# Patient Record
Sex: Male | Born: 1950
Health system: Southern US, Community
[De-identification: ages and names within clinical notes are randomized; demographics above are authoritative.]

## PROBLEM LIST (undated history)

## (undated) DIAGNOSIS — I469 Cardiac arrest, cause unspecified: Secondary | ICD-10-CM

## (undated) DIAGNOSIS — I5023 Acute on chronic systolic (congestive) heart failure: Secondary | ICD-10-CM

## (undated) DIAGNOSIS — I2510 Atherosclerotic heart disease of native coronary artery without angina pectoris: Secondary | ICD-10-CM

## (undated) DIAGNOSIS — R6520 Severe sepsis without septic shock: Secondary | ICD-10-CM

## (undated) DIAGNOSIS — I219 Acute myocardial infarction, unspecified: Secondary | ICD-10-CM

## (undated) DIAGNOSIS — C439 Malignant melanoma of skin, unspecified: Secondary | ICD-10-CM

## (undated) DIAGNOSIS — J189 Pneumonia, unspecified organism: Secondary | ICD-10-CM

## (undated) DIAGNOSIS — I472 Ventricular tachycardia, unspecified: Secondary | ICD-10-CM

## (undated) DIAGNOSIS — A419 Sepsis, unspecified organism: Secondary | ICD-10-CM

## (undated) DIAGNOSIS — F419 Anxiety disorder, unspecified: Secondary | ICD-10-CM

## (undated) DIAGNOSIS — I2589 Other forms of chronic ischemic heart disease: Secondary | ICD-10-CM

## (undated) DIAGNOSIS — E119 Type 2 diabetes mellitus without complications: Secondary | ICD-10-CM

## (undated) DIAGNOSIS — I238 Other current complications following acute myocardial infarction: Secondary | ICD-10-CM

## (undated) HISTORY — DX: Acute on chronic systolic (congestive) heart failure: I50.23

## (undated) HISTORY — DX: Malignant melanoma of skin, unspecified: C43.9

## (undated) HISTORY — DX: Ventricular tachycardia, unspecified: I47.20

## (undated) HISTORY — DX: Sepsis, unspecified organism: R65.20

## (undated) HISTORY — DX: Other current complications following acute myocardial infarction: I23.8

## (undated) HISTORY — DX: Severe sepsis without septic shock: A41.9

## (undated) HISTORY — DX: Atherosclerotic heart disease of native coronary artery without angina pectoris: I25.10

## (undated) HISTORY — PX: INSERT / REPLACE / REMOVE PACEMAKER: SUR710

## (undated) HISTORY — DX: Ventricular tachycardia: I47.2

## (undated) HISTORY — DX: Cardiac arrest, cause unspecified: I46.9

## (undated) HISTORY — DX: Acute myocardial infarction, unspecified: I21.9

## (undated) HISTORY — DX: Type 2 diabetes mellitus without complications: E11.9

## (undated) HISTORY — DX: Anxiety disorder, unspecified: F41.9

## (undated) HISTORY — PX: CATARACT EXTRACTION: SUR2

## (undated) HISTORY — PX: CARDIAC CATHETERIZATION: SHX172

## (undated) HISTORY — DX: Other forms of chronic ischemic heart disease: I25.89

---
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
## 2003-08-30 DIAGNOSIS — I219 Acute myocardial infarction, unspecified: Secondary | ICD-10-CM

## 2003-08-30 HISTORY — DX: Acute myocardial infarction, unspecified: I21.9

## 2005-12-31 ENCOUNTER — Ambulatory Visit: Payer: Self-pay | Admitting: Cardiology

## 2006-01-11 ENCOUNTER — Encounter: Payer: Self-pay | Admitting: Cardiology

## 2006-01-11 ENCOUNTER — Ambulatory Visit: Payer: Self-pay

## 2006-01-11 ENCOUNTER — Ambulatory Visit: Payer: Self-pay | Admitting: Cardiology

## 2006-01-17 ENCOUNTER — Ambulatory Visit: Payer: Self-pay | Admitting: Cardiology

## 2006-01-21 ENCOUNTER — Ambulatory Visit: Payer: Self-pay | Admitting: Internal Medicine

## 2006-01-28 ENCOUNTER — Ambulatory Visit: Payer: Self-pay | Admitting: Cardiology

## 2006-02-11 ENCOUNTER — Ambulatory Visit: Payer: Self-pay | Admitting: Internal Medicine

## 2006-02-28 ENCOUNTER — Ambulatory Visit: Payer: Self-pay | Admitting: Cardiology

## 2006-03-08 ENCOUNTER — Ambulatory Visit: Payer: Self-pay | Admitting: Internal Medicine

## 2006-03-18 ENCOUNTER — Ambulatory Visit: Payer: Self-pay | Admitting: Internal Medicine

## 2006-04-08 ENCOUNTER — Ambulatory Visit: Payer: Self-pay | Admitting: Cardiovascular Disease

## 2006-04-29 ENCOUNTER — Ambulatory Visit: Payer: Self-pay | Admitting: Cardiology

## 2006-06-20 ENCOUNTER — Ambulatory Visit: Payer: Self-pay | Admitting: Cardiology

## 2006-06-20 ENCOUNTER — Ambulatory Visit: Payer: Self-pay

## 2006-06-21 ENCOUNTER — Ambulatory Visit: Payer: Self-pay

## 2006-06-21 ENCOUNTER — Encounter: Payer: Self-pay | Admitting: Cardiology

## 2006-07-06 ENCOUNTER — Ambulatory Visit: Payer: Self-pay | Admitting: Cardiology

## 2006-07-25 ENCOUNTER — Ambulatory Visit: Payer: Self-pay | Admitting: Cardiology

## 2006-07-25 ENCOUNTER — Ambulatory Visit: Payer: Self-pay | Admitting: Internal Medicine

## 2006-08-08 ENCOUNTER — Ambulatory Visit: Payer: Self-pay | Admitting: *Deleted

## 2006-08-29 ENCOUNTER — Ambulatory Visit: Payer: Self-pay | Admitting: Cardiology

## 2006-09-26 ENCOUNTER — Ambulatory Visit: Payer: Self-pay | Admitting: Cardiology

## 2007-03-06 ENCOUNTER — Ambulatory Visit: Payer: Self-pay | Admitting: Cardiology

## 2007-03-06 LAB — CONVERTED CEMR LAB
ALT: 41 units/L — ABNORMAL HIGH (ref 0–40)
AST: 26 units/L (ref 0–37)
Bilirubin, Direct: 0.1 mg/dL (ref 0.0–0.3)
TSH: 1.68 microintl units/mL (ref 0.35–5.50)
Total Bilirubin: 0.8 mg/dL (ref 0.3–1.2)

## 2007-03-21 ENCOUNTER — Ambulatory Visit: Payer: Self-pay | Admitting: Cardiology

## 2007-03-21 ENCOUNTER — Ambulatory Visit: Payer: Self-pay | Admitting: Internal Medicine

## 2008-03-25 ENCOUNTER — Encounter: Payer: Self-pay | Admitting: Cardiology

## 2008-03-25 ENCOUNTER — Ambulatory Visit: Payer: Self-pay | Admitting: Cardiology

## 2008-03-25 ENCOUNTER — Ambulatory Visit: Payer: Self-pay

## 2008-03-26 ENCOUNTER — Ambulatory Visit: Payer: Self-pay | Admitting: Cardiology

## 2008-03-26 LAB — CONVERTED CEMR LAB
AST: 31 units/L (ref 0–37)
Alkaline Phosphatase: 50 units/L (ref 39–117)
Bilirubin, Direct: 0.1 mg/dL (ref 0.0–0.3)
Chloride: 107 meq/L (ref 96–112)
GFR calc Af Amer: 81 mL/min
GFR calc non Af Amer: 67 mL/min
Glucose, Bld: 111 mg/dL — ABNORMAL HIGH (ref 70–99)
HDL: 34.1 mg/dL — ABNORMAL LOW (ref >39.0)
Potassium: 4.4 meq/L (ref 3.5–5.1)
Pro B Natriuretic peptide (BNP): 37 pg/mL (ref 0.0–100.0)
Sodium: 140 meq/L (ref 135–145)
TSH: 1.8 microintl units/mL (ref 0.35–5.50)

## 2008-04-03 ENCOUNTER — Ambulatory Visit: Payer: Self-pay | Admitting: Internal Medicine

## 2008-11-26 ENCOUNTER — Encounter: Payer: Self-pay | Admitting: Emergency Medicine

## 2008-11-26 ENCOUNTER — Ambulatory Visit: Payer: Self-pay | Admitting: Cardiology

## 2008-11-26 ENCOUNTER — Inpatient Hospital Stay (HOSPITAL_COMMUNITY): Admission: EM | Admit: 2008-11-26 | Discharge: 2008-11-28 | Payer: Self-pay | Admitting: Cardiology

## 2008-11-27 ENCOUNTER — Encounter: Payer: Self-pay | Admitting: Cardiology

## 2008-11-28 LAB — CBC AND DIFFERENTIAL
HCT: 44 (ref 41–53)
Hemoglobin: 14.8 (ref 13.5–17.5)
Platelets: 154 (ref 150–399)
WBC: 7.2

## 2008-11-28 LAB — BASIC METABOLIC PANEL
BUN: 12 (ref 4–21)
Creatinine: 1.2 (ref 0.6–1.3)
Potassium: 4.3 (ref 3.4–5.3)
Sodium: 140 (ref 137–147)

## 2008-12-17 ENCOUNTER — Ambulatory Visit: Payer: Self-pay | Admitting: Cardiology

## 2008-12-24 ENCOUNTER — Ambulatory Visit: Payer: Self-pay | Admitting: Internal Medicine

## 2009-01-02 ENCOUNTER — Ambulatory Visit: Payer: Self-pay | Admitting: Internal Medicine

## 2009-01-02 DIAGNOSIS — I5023 Acute on chronic systolic (congestive) heart failure: Secondary | ICD-10-CM

## 2009-01-02 DIAGNOSIS — Z95810 Presence of automatic (implantable) cardiac defibrillator: Secondary | ICD-10-CM

## 2009-01-02 DIAGNOSIS — I5022 Chronic systolic (congestive) heart failure: Secondary | ICD-10-CM

## 2009-01-02 DIAGNOSIS — R0989 Other specified symptoms and signs involving the circulatory and respiratory systems: Secondary | ICD-10-CM | POA: Insufficient documentation

## 2009-01-02 HISTORY — DX: Acute on chronic systolic (congestive) heart failure: I50.23

## 2009-01-17 ENCOUNTER — Ambulatory Visit: Payer: Self-pay | Admitting: Cardiology

## 2009-01-22 ENCOUNTER — Encounter: Payer: Self-pay | Admitting: Internal Medicine

## 2009-01-24 ENCOUNTER — Ambulatory Visit: Admission: RE | Admit: 2009-01-24 | Discharge: 2009-01-24 | Payer: Self-pay | Admitting: Cardiology

## 2009-01-24 ENCOUNTER — Ambulatory Visit: Payer: Self-pay | Admitting: Internal Medicine

## 2009-02-18 ENCOUNTER — Encounter: Payer: Self-pay | Admitting: Nurse Practitioner

## 2009-02-18 ENCOUNTER — Ambulatory Visit: Payer: Self-pay | Admitting: Internal Medicine

## 2009-02-18 DIAGNOSIS — I238 Other current complications following acute myocardial infarction: Secondary | ICD-10-CM

## 2009-02-18 DIAGNOSIS — I2510 Atherosclerotic heart disease of native coronary artery without angina pectoris: Secondary | ICD-10-CM | POA: Insufficient documentation

## 2009-02-18 HISTORY — DX: Other current complications following acute myocardial infarction: I23.8

## 2009-03-19 ENCOUNTER — Encounter: Payer: Self-pay | Admitting: Internal Medicine

## 2009-03-19 ENCOUNTER — Ambulatory Visit: Payer: Self-pay | Admitting: Internal Medicine

## 2009-03-19 DIAGNOSIS — I472 Ventricular tachycardia, unspecified: Secondary | ICD-10-CM

## 2009-03-19 DIAGNOSIS — I2589 Other forms of chronic ischemic heart disease: Secondary | ICD-10-CM

## 2009-03-19 HISTORY — DX: Ventricular tachycardia, unspecified: I47.20

## 2009-03-19 HISTORY — DX: Other forms of chronic ischemic heart disease: I25.89

## 2009-03-19 HISTORY — DX: Ventricular tachycardia: I47.2

## 2009-04-02 ENCOUNTER — Telehealth: Payer: Self-pay | Admitting: Internal Medicine

## 2009-07-10 ENCOUNTER — Encounter: Payer: Self-pay | Admitting: Cardiology

## 2009-09-11 ENCOUNTER — Encounter (INDEPENDENT_AMBULATORY_CARE_PROVIDER_SITE_OTHER): Payer: Self-pay | Admitting: *Deleted

## 2009-12-26 ENCOUNTER — Telehealth: Payer: Self-pay | Admitting: Internal Medicine

## 2009-12-29 ENCOUNTER — Telehealth: Payer: Self-pay | Admitting: Internal Medicine

## 2010-08-28 ENCOUNTER — Encounter (INDEPENDENT_AMBULATORY_CARE_PROVIDER_SITE_OTHER): Payer: Self-pay | Admitting: *Deleted

## 2010-09-11 ENCOUNTER — Encounter: Payer: Self-pay | Admitting: Internal Medicine

## 2010-11-16 ENCOUNTER — Encounter: Payer: Self-pay | Admitting: Internal Medicine

## 2010-12-29 NOTE — Letter (Signed)
Summary: Device-Delinquent Check  [facility], Main Office  [address]   [place], [place] [address]   Phone: [phone]  Fax: [phone]     August 28, 2010 MRN: [id number]   Lee Hall [address] [place], [place]  [address]   Dear Lee Hall,  According to our records, you have not had your implanted device checked in the recommended period of time.  We are unable to determine appropriate device function without checking your device on a regular basis.  Please call our office to schedule an appointment with Dr Ladona Ridgel, as soon as possible.  If you are having your device checked by another physician, please call us so that we may update our records.  Thank you,  Letta Moynahan, EMT  August 28, 2010 3:07 PM  [facility] certified

## 2010-12-29 NOTE — Progress Notes (Signed)
Summary: refill  Phone Note Refill Request   Refills Requested: Medication #1:  AMIODARONE HCL 200 MG TABS 1 1/2 tablet by mouth daily..   Supply Requested: 3 months  Medication #2:  SIMVASTATIN 80 MG TABS take one tab  daily   Supply Requested: 3 months  Medication #3:  LISINOPRIL 20 MG TABS tke one tab once daily   Supply Requested: 3 months  Medication #4:  CARVEDILOL 12.5 MG TABS take 1 1/2 bid   Supply Requested: 3 months [facility] in [place] fax [phone] please call pt once done [phone]   Method Requested: Fax to Local Pharmacy Initial call taken by: Migdalia Dk,  December 26, 2009 3:53 PM    New/Updated Medications: AMIODARONE HCL 200 MG TABS (AMIODARONE HCL) Take 1 1/2  tablet by mouth daily Prescriptions: AMIODARONE HCL 200 MG TABS (AMIODARONE HCL) Take 1 1/2  tablet by mouth daily  #135 x 3   Entered by:   Laurance Flatten CMA   Authorized by:   Laren Boom, MD, [facility]   Signed by:   Laurance Flatten CMA on 12/26/2009   Method used:   Printed then faxed to ...       [facility]* (retail)       [address] [place], [place]  [address]       Ph: [phone]       Fax: [phone]   RxID:   [id number] CARVEDILOL 12.5 MG TABS (CARVEDILOL) take 1 1/2 bid  #180 x 3   Entered by:   Laurance Flatten CMA   Authorized by:   Laren Boom, MD, [facility]   Signed by:   Laurance Flatten CMA on 12/26/2009   Method used:   Printed then faxed to ...       [facility]* (retail)       [address] [place], [place]  [address]       Ph: [phone]       Fax: [phone]   RxID:   [id number] LISINOPRIL 20 MG TABS (LISINOPRIL) tke one tab once daily  #90 x 3   Entered by:   Laurance Flatten CMA   Authorized by:   Laren Boom, MD, [facility]   Signed by:   Laurance Flatten CMA on 12/26/2009   Method used:   Printed then faxed to ...       [facility]* (retail)       [address] [place], [place]  [address]       Ph: [phone]       Fax: [phone]   RxID:   [id number] SIMVASTATIN 80 MG TABS (SIMVASTATIN) take one tab  daily  #90 x 3   Entered by:   Laurance Flatten CMA   Authorized by:   Laren Boom, MD, [facility]   Signed by:   Laurance Flatten CMA on 12/26/2009   Method used:   Printed then faxed to ...       [facility]* (retail)       [address] [place], [place]  [address]       Ph: [phone]       Fax: [phone]   RxID:  [id number]  

## 2010-12-29 NOTE — Progress Notes (Signed)
Summary: please resend to correct pharm  Phone Note Refill Request   Refills Requested: Medication #1:  AMIODARONE HCL 200 MG TABS Take 1 1/2  tablet by mouth daily.  Medication #2:  SIMVASTATIN 80 MG TABS take one tab  daily  Medication #3:  LISINOPRIL 20 MG TABS tke one tab once daily  Medication #4:  CARVEDILOL 12.5 MG TABS take 1 1/2 bid Please resend to [facility] in [place] fax [phone] please call pt once done [phone]   Method Requested: Fax to Pharmacy Initial call taken by: Migdalia Dk,  December 29, 2009 10:40 AM  Follow-up for Phone Call        Rx faxed to pharmacy [facility]. Talked to [facility]  faxed on 12/29/09 Follow-up by: Oswald Hillock,  December 29, 2009 4:27 PM

## 2010-12-29 NOTE — Cardiovascular Report (Signed)
Summary: Certified Letter - Notice Left  Certified Letter - Notice Left   Imported By: Debby Freiberg 10/05/2010 14:33:48  _____________________________________________________________________  External Attachment:    Type:   Image     Comment:   External Document

## 2010-12-31 NOTE — Cardiovascular Report (Signed)
Summary: Certified Letter - Returned  Certified Letter - Returned   Imported By: Debby Freiberg 11/20/2010 14:06:06  _____________________________________________________________________  External Attachment:    Type:   Image     Comment:   External Document

## 2011-04-13 NOTE — Cardiovascular Report (Signed)
NAME:  Lee Hall, Lee Hall NO.:  [id number]   MEDICAL RECORD NO.:  [id number]          PATIENT TYPE:  INP   LOCATION:  2023                         FACILITY:  [facility]   PHYSICIAN:  Marca Ancona, MD      DATE OF BIRTH:  1951/09/15   DATE OF PROCEDURE:  DATE OF DISCHARGE:  11/26/2008                            CARDIAC CATHETERIZATION   PROCEDURE:  1. Left heart catheterization.  2. Coronary angiography.   INDICATIONS:  Ventricular tachycardia.  The patient does have a history  of ischemic cardiomyopathy.   PROCEDURE NOTE:  After informed consent was obtained, the right groin  was sterilely prepped and draped, 1% lidocaine was used to locally  anesthetize the right groin area.  The right common femoral artery was  entered using Seldinger technique and a 6-French arterial sheath was  placed in the artery.  The 6-French multipurpose catheter was used to  engage the left coronary artery and the right coronary artery.  The left  ventricle was entered using the 6-French multipurpose catheter.  Left  ventriculography was not performed given the patient's history of an  apical thrombus as well as an echo done this year.   FINDINGS:  Hemodynamics:  Aorta 106/63, LV 100/4/17.  Angiography:  The  right coronary artery is dominant.  There is a 30% distal RCA stenosis.  There is no significant left main disease.  There are minor luminal  irregularities in the circumflex.  There is a small ramus that does not  have any significant disease.  The proximal LAD is totally occluded  after the takeoff of a moderate-sized first diagonal.  There are right  to left collaterals from the PDA that fill the distal LAD for a short  length.   ASSESSMENT AND PLAN:  A 60 year old with ischemic cardiomyopathy who  presented with ventricular tachycardia.  He was found to have chronic  total occlusion of the LAD.  He does have collaterals from the PDA to  the distal LAD territory as well as a  moderate-sized first diagonal that  covers part of the lateral wall.  The plan at this point would be  medical management of his coronary artery disease and his congestive  heart failure.      Marca Ancona, MD  Electronically Signed     DM/MEDQ  D:  11/27/2008  T:  11/28/2008  Job:  [id number]   cc:   Jonelle Sidle, MD

## 2011-04-13 NOTE — Assessment & Plan Note (Signed)
[facility]                       [place] CARDIOLOGY OFFICE NOTE   LAKER, THOMPSON                   MRN:          [id number]  DATE:12/17/2008                            DOB:          [birth year]    CARDIOLOGIST:  Jonelle Sidle, MD   REASON FOR VISIT:  Posthospitalization followup.   HISTORY OF PRESENT ILLNESS:  Mr. Dettore is a 60 year old male with a  history of coronary artery disease and ischemic cardiomyopathy.  He was  recently admitted to [facility] with slow ventricular  tachycardia.  He underwent cardiac catheterization that demonstrated  stable anatomy.  He has a chronic total occlusion of the LAD with  collaterals from the PDA to the distal LAD.  Otherwise, he has  insignificant disease elsewhere.  His ejection fraction on  echocardiogram in late December 2009 was 25-35%.  The patient had his  amiodarone dose increased and his defibrillator was adjusted to  recognize ventricular tachycardia at a slower rate.  Since being  discharged from the hospital, he has had one episode that he thinks was  ventricular tachycardia.  He was lightheaded and flushed for about 15-20  minutes.  It sounds as though he had antitachycardia pacing that got him  out of the ventricular tachycardia.  Otherwise, he denies chest pain.  He has chronic dyspnea on exertion.  He describes NYHA class IIB-III  symptoms.  He denies orthopnea, PND, or pedal edema.  Denies any  syncope.   CURRENT MEDICATIONS:  1. Lisinopril 40 mg daily.  2. Aspirin 81 mg daily.  3. Pacerone 200 mg b.i.d.  4. Carvedilol 12.5 mg b.i.d.  5. Simvastatin 80 mg daily.   PHYSICAL EXAMINATION:  GENERAL:  He is a well-nourished, well-developed  man in no acute distress.  VITAL SIGNS:  Blood pressure 140/85, pulse 60, weight 210 pounds.  HEENT:  Normal.  NECK:  Without JVD.  CARDIAC:  Normal S1, S2.  Regular rate and rhythm.  LUNGS:  Clear to auscultation  bilaterally.  ABDOMEN:  Soft, nontender.  EXTREMITIES:  Without edema.  NEUROLOGIC:  He is alert and oriented x3.  Cranial nerves II through XII  grossly intact.  VASCULAR:  Right femoral arteriotomy site without hematoma or bruit.   ASSESSMENT AND PLAN:  1. Ventricular tachycardia in a 60 year old male with ischemic      cardiomyopathy with an ejection fraction of 25-35%.  He is status      post automatic implantable cardioverter-defibrillator and has had      his defibrillator adjusted recently.  He has also had his Pacerone      increased to 200 mg b.i.d.  I discussed his case today over the      telephone with his primary cardiologist, Dr. Diona Browner.  He feels      that the patient will be better served following up in the      [facility].  The patient also lives in [place].  He      primarily needs followup with Dr. Lewayne Bunting who has seen him in      the past with electrophysiology.  We will try to  get him in to see      Dr. Ladona Ridgel in the next 1-2 weeks especially in light of his recent      symptoms of recurrent ventricular tachycardia.  I explained to the      patient that he may or may not need further adjustments in his      medical therapy.  We will also try to set him up in the [facility] Failure Clinic in [place] to better adjust his medications      for his cardiomyopathy.  2. Coronary artery disease status post prior myocardial infarction.      He had a recent cardiac catheterization that demonstrated stable      anatomy.  3. Dyslipidemia.  He continues on simvastatin.   DISPOSITION:  As noted above, the patient will be set up for followup  with Dr. Ladona Ridgel in [place] as well as our CHF Clinic in [place].      Tereso Newcomer, PA-C  Electronically Signed      Gerrit Friends. Dietrich Pates, MD, [facility]  Electronically Signed   SW/MedQ  DD: 12/17/2008  DT: 12/18/2008  Job #: [id number]

## 2011-04-13 NOTE — Assessment & Plan Note (Signed)
[facility]                         ELECTROPHYSIOLOGY OFFICE NOTE   Lee Hall, Lee Hall                   MRN:          [id number]  DATE:12/24/2008                            DOB:          1951-09-10    [name] comes today for followup.  He is very pleasant middle-aged  male with an ischemic cardiomyopathy and ventricular tachycardia.  He  was admitted to hospital back in December with VT storm, his amiodarone  was up titrated, he returns today for followup.  I should note that the  patient's VT was below the detection rate back in December with VT  detection at that time set 170, but it has since reduced to 120.  The  patient gives history of 1 episode of palpitations occurring several  days ago.  He did not check his pulse.  He thought he was in VT, though  he did not get any ICD therapies.   CURRENT MEDICATIONS:  1. Lisinopril 40 a day.  2. Aspirin 81 a day.  3. Pacerone 200 twice a day.  4. Carvedilol 12.5 twice daily.  5. Simvastatin 80 a day.   PHYSICAL EXAM:  GENERAL:  He is a pleasant well-appearing middle-aged  man in no distress.  VITAL SIGNS:  Blood pressure was 120/78, the pulse 64 and regular, the  respirations were 18, the weight was 208 pounds.  NECK:  Revealed no jugular distention.  LUNGS:  Clear bilateral auscultation.  No wheezes, rales, or rhonchi are  present.  No increased work of breathing.  CARDIOVASCULAR:  Revealed regular rate and rhythm.  Normal S1-S2.  ABDOMEN:  Soft, nontender.  EXTREMITIES:  Demonstrated no edema.   Interrogation of his defibrillator demonstrates Medtronic maximal, the P  and R waves were 3 and 6 respectively.  The impedance is 500 in the A,  432 in the V, the threshold is 1.2 in the A and 1.3 in the V.  Battery  voltage was 3 volts.  The patient's underlying rhythm was sinus and 60  beats per minute.  There are no intermittent ICD therapies.  He was 100%  A sensed, V sensed.   IMPRESSION:  1. Ventricular tachycardia.  2. Amiodarone therapy secondary to ventricular tachycardia.  3. Ischemic cardiomyopathy, status post anterior myocardial infarction      with the ejection fraction of 30%, class I to II heart failure.   DISCUSSION:  [name] is stable.  His VT is quiet.  The present  time on amiodarone.  I plan to see him back in 4 months and at that time  the plan will be to decrease his amiodarone from 400-300 a day if he has  more VT, he is instructed call us.  I have told if he feels  palpitations to try to have a blood pressure machine check his heart  rate and if his heart rates  were above 100 to call us.  Finally because the patient gets dizzy when  he stands up quickly, I have asked him that he slow down and not to get  up in a hurry.     Doylene Canning. Ladona Ridgel, MD  Electronically Signed    GWT/MedQ  DD: 12/24/2008  DT: 12/25/2008  Job #: [id number]

## 2011-04-13 NOTE — Assessment & Plan Note (Signed)
[facility]                         ELECTROPHYSIOLOGY OFFICE NOTE   ANKITH, EDMONSTON                   MRN:          [id number]  DATE:04/03/2008                            DOB:          07/14/1951    Mr. Whiteman returns today after a long absence from our EP clinic.  He  is a very pleasant middle-aged male with an ischemic cardiomyopathy and  ventricular tachycardia, who is status post ICD implantation.  He  returns today for followup.   He is presently on:  1. Amiodarone 200 mg a day.  2. Toprol-XL 100 a day.  3. Aspirin 81 a day.  4. Crestor 20 a day.  5. He is also on lisinopril 40 a day.   PHYSICAL EXAMINATION:  He is a pleasant middle-aged man in no acute  distress.  The blood pressure is 131/78, the pulse 58 and regular and the  respirations were 18.  The weight was 209 pounds.  NECK:  Revealed no jugular distention.  LUNGS:  Clear bilaterally to auscultation.  No wheezes, rales or rhonchi  were present.  There was no increased work of breathing.  CARDIOVASCULAR:  Exam reveals a regular rate and rhythm with normal S1  and S2.  EXTREMITIES:  Demonstrated no cyanosis, clubbing or edema.  The pulses  were 2+ and symmetric.   Interrogation of his defibrillator demonstrates a Medtronic Maximo.  P  and R waves were 3 and 7, respectively, the impedance 496 in the A, 440  in the V, the threshold of a volt at 0.3 in the A and a volt of 0.4 in  the V.  Battery voltage was 3.02 volts.  The patient was A-sensed/V-  sensed 100% of the time.  He did have 1 episode of very fast VT, which  was terminated with anti-tachycardia pacing therapies.  He has a [id number]  Sprint Fidelis lead, which is working satisfactorily.  Of note, his last  defibrillator check was a year ago.  Finally, today the patient talks  about problems where he feels a sudden flushing sensations in his head  which lasts for about 1 minute; the etiology of this is unclear; it is  associated with some nausea.   IMPRESSION:  1. Ischemic cardiomyopathy with severe left ventricular dysfunction.  2. Ventricular tachycardia.  3. Status post implantable cardioverter-defibrillator insertion.   DISCUSSION:  Mr. Longmore is stable.  He will continue on his current  medical therapy.  We will plan to see him back in the office in 1 year  with me and in 3-4 months in our device clinic.     Doylene Canning. Ladona Ridgel, MD  Electronically Signed    GWT/MedQ  DD: 04/03/2008  DT: 04/03/2008  Job #: [id number]

## 2011-04-13 NOTE — Assessment & Plan Note (Signed)
[facility]                          CHRONIC HEART FAILURE NOTE   MACKEY, VARRICCHIO                   MRN:          [id number]  DATE:01/02/2009                            DOB:          July 01, 1951    PRIMARY CARDIOLOGIST:  Doylene Canning. Ladona Ridgel, MD   Lee Hall is new to the Heart Failure Clinic.  He is a 60 year old  Caucasian gentleman with a known history of coronary artery disease and  ischemic cardiomyopathy, recently seen at [facility] for  ventricular tachycardia.  He underwent cardiac catheterization that  demonstrated stable anatomy.  The patient had chronic total occlusion of  the LAD with collaterals from the PDA to the distal LAD, EF 25-35%.  The  patient also had his amiodarone dose increase and his defibrillator was  adjusted to recognize ventricular tachycardia slower rate.  Since being  discharged home, he is followed up with Dr. Tereso Newcomer for post  hospital visit in [place].  Unclear as to why the patient has got  into [place] as he lives near [facility] and he states he  would like to just follow up here at the [facility].  Post  hospital visit in January, no changes were made in the patient's  medications and he was set up to follow up here at the Heart Failure  Clinic and with Dr. Ladona Ridgel here.  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  He is single.  He lives  with his sister and brother-in-law here in [place].  States he has  never had any frank episodes of congestive heart failure.  Initial  cardiac history began when he had a MI in [place] in 2005.  Subsequently, had an ICD placed for episodes of AFib for which he was  successfully resuscitated.  Lee Hall has a somewhat sedentary  lifestyle now.  He states he has been searching for jobs, does lot of  work on [employer],  not very active, little anxious about having a  device implanted and the possibility of it firing as this has happened  to him twice since it was implanted.  He denies any episodes of chest  discomfort, palpitations.  He states he easily begins with breakfast at  a local diner where he has 2 scrambled eggs, grits, and toast.  He  states he might have a banana sometime during the middle of the day and  then for dinner he returns to the same diner where he usually has a  grilled hamburger without mayonnaise or cheese and tea.  He drinks  decaffeinated coffee in the mornings.  He is not very cognizant of the  amount of sodium in his diet.  As he states he generally eats at that  diner most of his meals.  He previously enjoyed playing golf, but  because of lightheadedness and dizziness, he has been fearful to play as  he becomes very fatigued and experiences the bouts of dizziness.  He  states compliance with his medications, but does not  seem to have a very  good insight on to his disease or prognosis.   PAST MEDICAL HISTORY:  1. Ischemic cardiomyopathy with EF 25-35% by echocardiogram in      December 2009.  2. Ventricular tachycardia with a Medtronic Maximo device, recent      hospitalization for firing with adjustments in amiodarone and      device threshold.  3. Dyslipidemia.  4. Left heart catheterization, November 27, 2008, LAD totally occluded      after first diagonal, distal LAD, good collaterals from the      posterior descending artery.  5. Substrate for mural thrombus.  The patient differed Coumadin.  6. Intermittent symptomatic orthostasis.  7. Remote history of tobacco use.  8. History of non mobile mural thrombus with a completed 72-month      course of Coumadin in 2007.   REVIEW OF SYSTEMS:  As stated above.   CURRENT MEDICATIONS:  1. Lisinopril 40.  2. Aspirin 81.  3. Pacerone 200 b.i.d.  4. Carvedilol 12.5 b.i.d.  5. Simvastatin 80.   PHYSICAL EXAMINATION:   VITAL SIGNS:  Weight 210 pounds, weight is up 2  pounds.  Blood pressure 127/77, 122/66 manually, heart rate 57.  GENERAL:  Lee Hall is in no acute distress.  He has no signs of  jugular vein distention at 45 degree angle.  LUNGS:  Clear to auscultation bilaterally.  CARDIOVASCULAR:  S1 and S2, regular rate and rhythm.  ABDOMEN:  Obese, soft, nontender, positive bowel sounds.  LOWER EXTREMITIES:  Without clubbing, cyanosis, or edema.  NEUROLOGIC:  Alert and oriented x3.   IMPRESSION:  Congestive heart failure secondary to ischemic  cardiomyopathy without signs of volume overload at this time.  I spent  approximately 1 hour educating Lee Hall about his diagnosis and the  long-term prognosis.  We have reviewed in depth his medications and the  reason for each medication and possible side effects.  We have discussed  his diet.  We have discussed his exercise and prognosis.  I would like  to schedule him for a CPX test; however, at this time Mr. Kitts do  not have any insurance and is hesitant to proceed, I can understand  this.  We will keep this on the back burner and hopefully proceed with  this.  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  He is very apprehensive about the ICD and  any exercise.  However, I want him to start an exercise program walking  10 minutes every day, 5 days a week on a flat surface to improve his  stamina as he has become very deconditioned since his diagnosis.  Also,  I have asked him to follow up on the sodium restricted diet education I  provided him with in regards to his taking his meals at a local diner  and the amount of salt he is probably consuming.  We will go ahead and  gently titrate his carvedilol.  I am going to space his lisinopril out  from 40  a day to 20 b.i.d.  He will take the lisinopril with his  Pacerone, the carvedilol, he will take 2 hours after his a.m. doses of  list of medicines and then he will take the carvedilol  at bedtime every  night.  I am going to increase the carvedilol bedtime dose to 18.375 mg.  Continue the morning dose at 12.5  mg for now.  He is to notify me if he experiences extreme  lightheadedness or dizziness with the medicine and I will plan on seeing  him back in 3 weeks for further adjustment of medications.      Dorian Pod, ACNP  Electronically Signed      Rollene Rotunda, MD, [facility]  Electronically Signed   MB/MedQ  DD: 01/02/2009  DT: 01/03/2009  Job #: [id number]

## 2011-04-13 NOTE — H&P (Signed)
NAME:  Lee Hall, Lee Hall NO.:  [id number]   MEDICAL RECORD NO.:  [id number]          PATIENT TYPE:  INP   LOCATION:  2902                         FACILITY:  [facility]   PHYSICIAN:  Jonelle Sidle, MD DATE OF BIRTH:  03-18-1951   DATE OF ADMISSION:  11/26/2008  DATE OF DISCHARGE:                              HISTORY & PHYSICAL   ELECTROPHYSIOLOGIST:  Doylene Canning. Ladona Ridgel, M.D.   REASON FOR ADMISSION:  Sustained ventricular tachycardia.   HISTORY OF PRESENT ILLNESS:  [name] is a 60 year old male with  history of an out-of-hospital anterior wall myocardial infarction back  in October of 2004 that was ultimately managed medically without  revascularization at a facility in [place], followed by prophylactic  Medtronic defibrillator placement in February of 2005.  Most recent left  ventricular ejection fraction by echocardiography in April of this year  was 35-40, and there is known dyskinesis of the periapical wall with  akinesis of the mid to distal anteroseptal wall associated with findings  of a non mobile mural thrombus in the distal apical septal distribution.  [name] was on Coumadin for 6 months following the original  diagnosis of this mural thrombus in 2007, although it may well have been  longer standing.  He has not been maintained on Coumadin long-term,  given problems with regular follow up and compliance and was last seen  in our office by Dr. Ladona Ridgel in May of this year following a visit with  me in April prior to my transition to the [facility].   He reports being clinically stable without any specific anginal chest  pain or breathlessness beyond NYHA class II.  He has had no recurrent  sense of palpitations or syncope.  Over the last 2 weeks, he has been  experiencing sudden onset episodes of lightheadedness and nausea.  He  states that approximately 2 weeks ago he was seated at a computer and  began to feel suddenly very  lightheaded and subsequently experienced a  tickle in his chest and noted that his heart rate was too fast to  count.  He stated that this sensation lasted for nearly an hour and  suddenly subsided.  He had no chest pain or syncope at that time.  Last  week while he was shopping prior to Christmas, he had a similar episode  and had to lie down on the floor.  Symptoms lasted less than an hour at  that time.  This evening after eating dinner at a restaurant, he stood  up to leave and again experienced sudden lightheadedness and nausea.  He  was transported by his brother-in-law to the [facility] emergency  department.  He was seen at that time by Dr. Adriana Simas and noted to be in a  wide complex regular tachycardia at approximately 140-150 beats per  minute.  I do not have an electrocardiogram of this arrhythmia.  He was  felt to be most likely in ventricular tachycardia and was initiated on  intravenous amiodarone and transferred to the coronary care unit here at  [facility].  On my interview with [name], he was  hemodynamically  quite stable; in fact mildly hypertensive, and denied any unusual  feelings other than a mild sensation of lightheadedness without chest  pain or breathlessness.  His tachycardia had slowed into the 120s  following an initial bolus of amiodarone 150 mg and initiation of a drip  at 1 mg per minute.  I ordered an additional 150 mg bolus, and it was  shortly following this that he converted to normal sinus rhythm.  The  duration of this episode was at least an hour.  Formal device  interrogation is presently pending.   [name] states that he has been compliant with his medications,  and we reviewed these as detailed below.  In reviewing Dr. Lubertha Basque  electrophysiology records, it seems that Mr. Stanczyk has typically had  very fast ventricular tachycardia and has undergone parameter changes  including his detection rate.  Following his last device  interrogation  in May of this year, it was noted that he had a single episode of  ventricular tachycardia which was terminated with anti-tachycardia  pacing.   ALLERGIES:  NO KNOWN DRUG ALLERGIES.   MEDICATIONS AT PRESENT:  1. Aspirin 81 mg p.o. daily.  2. Toprol-XL 100 mg p.o. daily.  3. Amiodarone 200 mg p.o. daily.  4. Simvastatin 80 mg p.o. q.h.s.  5. Lisinopril 40 mg p.o. daily.  6. He is on amiodarone infusion at 1 mg per minute at this time.   PAST MEDICAL HISTORY:  As outlined above.  Additional problems include:  1. Hyperlipidemia managed with statin therapy.  2. Intermittent symptomatic orthostasis.  3. There is prior history of defibrillator discharges due to      ventricular tachycardia documented at a facility in [place],      prompting initiation of amiodarone over the last year and a half.   SOCIAL HISTORY:  At the present time, [name] is out of work.  He  states that he was laid off approximately a year ago from his prior job  in [place].  He resides at this time with a sister here in [place].  He drinks alcohol no more than twice a week and has a 20 pack year  history of tobacco abuse, although quit in 2004 with his infarction.   FAMILY HISTORY:  Reviewed and is significant for Alzheimer's dementia in  his mother, as well as cirrhosis in his father.  No obvious premature  cardiovascular disease otherwise noted.   REVIEW OF SYSTEMS:  Outlined above.  [name] states of a normal  appetite, no bleeding problems, no speech deficits or focal motor  deficits.  He has had no orthopnea or PND and denies any lower extremity  edema.  Otherwise, he reports he generally felt well.  He did have an  influenza vaccine this year.  Otherwise negative.   PHYSICAL EXAMINATION:  VITAL SIGNS:  Blood pressure 119/74, heart rate  63 in sinus rhythm, saturation is 97% on 2 liters nasal cannula.  Respirations 16.  GENERAL:  This is a normally nourished appearing  male in no acute  distress.  HEENT:  Conjunctivae normal.  Oropharynx is clear.  NECK:  Supple.  No elevated jugular venous pressure, without bruits.  No  thyromegaly is noted.  LUNGS:  Clear without labored breathing at rest.  CARDIAC:  Regular rate and rhythm.  No loud S3 gallop.  A soft apical  systolic murmur is noted.  No pericardial rub.  PMI is indistinct.  Stable device pocket site is noted.  ABDOMEN:  Soft, nontender.  No active bowel sounds.  EXTREMITIES:  No significant pitting edema.  Distal pulses are 1 to 2+.  SKIN:  Warm and dry.  MUSCULOSKELETAL:  No kyphosis noted.  NEUROPSYCHIATRIC:  The patient is alert and oriented x3.  Affect is  appropriate.   LABORATORY DATA:  WBC 6.4, hemoglobin 15.6, hematocrit 45.7, platelets  170, D-dimer 0.55.  Sodium 136, potassium 3.4, chloride 101, bicarbonate  27, glucose 176, BUN 17, creatinine 1.2, AST 29, ALT 34, albumin 4.4,  troponin-I less than 0.05, CK-MB 1.7.   ELECTROCARDIOGRAM:  A 12-lead electrocardiogram initially shows a wide  complex tachycardia at 122 beats per minute with leftward axis,  subsequently following return to sinus rhythm demonstrates evidence of  previous myocardial infarction with nonspecific ST-T wave changes.  He  has had aneurysmal change noted in the anteroseptal leads previously.   CHEST X-RAY:  Mild cardiomegaly with no acute edema or air space  disease.   IMPRESSION:  1. Recurrent, sustained wide complex tachycardia consistent with      ventricular tachycardia with formal device interrogation pending.      The patient has converted to sinus rhythm following a bolus and      infusion of amiodarone and has been hemodynamically stable      throughout without active chest pain.  Point of care markers are      normal.  Mr. Bascom denies any antecedent angina or      breathlessness beyond NYHA class II.  He reports compliance with      his medications which as an outpatient includes amiodarone 200  mg      daily.  2. Ischemic cardiomyopathy with left ventricular ejection fraction of      35-40% by echocardiography in April of this year.  Mr. Nickolson      has not undergone any recent ischemic testing, although as I      recall, he may have had a noninvasive stress test during his      evaluation in [place] back in 2008 (information available in his      [place] office chart).  His point of care markers are normal,      and he does not report any significant angina of late.  3. History of non mobile mural thrombus with dyskinetic apex and      akinetic mid to distal anteroseptum.  Mr. Koval completed a 6-      month course of Coumadin following his original diagnosis with this      back in 2007, although was not maintained on this long-term given      problems with compliance and follow up.  4. Hyperlipidemia, on statin therapy.   PLAN:  Mr. Dever is now admitted to the coronary care unit.  We will  plan to continue his aspirin, simvastatin, lisinopril, Toprol-XL and  amiodarone infusion.  Cardiac markers will be cycled, and we will also  obtain a TSH, fasting lipid profile and a hemoglobin A1c.  His initial  random glucose was 176, although he carries no formal diagnosis of  diabetes mellitus.  Formal device interrogation is pending to better  understand the frequency and duration of his ventricular arrhythmias.  He may be experiencing recurrent ventricular tachycardia at a slower  rate under his detection zone for either defibrillation or anti-  tachycardia pacing however.  One also wonders if there is an ischemic  precipitant for these arrhythmias, although frankly he reports being  very stable without angina.  We will plan a follow-up 2-D  echocardiogram, and Dr. Ladona Ridgel will reevaluate Mr. Basher in the  morning.  He will be kept n.p.o. after midnight.  DVT dose Lovenox will  be initiated.  Further plans to follow.      Jonelle Sidle, MD   Electronically Signed     SGM/MEDQ  D:  11/26/2008  T:  11/27/2008  Job:  [id number]   cc:   Doylene Canning. Ladona Ridgel, MD  Dr. Yetta Barre

## 2011-04-13 NOTE — Discharge Summary (Signed)
Lee Hall, NEIS NO.:  [id number]   MEDICAL RECORD NO.:  [id number]          PATIENT TYPE:  INP   LOCATION:  2023                         FACILITY:  [facility]   PHYSICIAN:  Doylene Canning. Ladona Ridgel, MD    DATE OF BIRTH:  08/04/1951   DATE OF ADMISSION:  11/26/2008  DATE OF DISCHARGE:  11/28/2008                               DISCHARGE SUMMARY   This patient has no known drug allergies.   FINAL DIAGNOSES:  1. Recurrent ventricular tachycardia.      a.     Ventricular tachycardia is beneath the implantable       cardioverter-defibrillator detection zone set at 176 beats per       minute.      b.     Reprogramming of the device with ventricular tachycardia       zone from 120-160 beats per minute.      c.     IV amiodarone on admission.  His oral amiodarone dose       increased to 200 mg twice daily from 200 mg daily.  2. Admitted with monomorphic ventricular tachycardia of 3 separate      episodes.      a.     Troponin Is are negative.  3. Left heart catheterization on November 27, 2008, the left anterior      descending is totaled after a first diagonal.  The distal left      anterior descending has collaterals from the posterior descending      artery.  4. Echocardiogram on November 27, 2008, ejection fraction 25-35%.      a.     Mid-to-distal septal wall akinesis; akinesis of the mid-to-       distal inferior wall; and diastolic dysfunction.  5. Substrate for mural thrombus, but the patient has deferred      Coumadin.   SECONDARY DIAGNOSES:  1. History of late presentation anterior myocardial infarction in      October 2004.      a.     Initial presentation to the emergency room was not treated       as a myocardial infarction.      b.     The patient represented to another emergency room about 24-       36 hours later and was diagnosed with completing myocardial       infarction.  2. Medtronic cardioverter-defibrillator for ischemic  cardiomyopathy/ventricular tachycardia implanted in February 2005.  3. New York Heart Association Class II-III chronic mixed congestive      heart failure.   PROCEDURES ON THIS ADMISSION:  1. November 27, 2008, left heart catheterization study shows total      occlusion of the proximal left anterior descending after the first      diagonal and the distal diagonal was fed by right-to-left      collaterals from the posterior descending artery.  There is no      significant disease in the left circumflex of the ramus      intermediate and the right coronary artery has nonobstructive 30%      disease  distally.  The first diagonal is moderate in size.  The      patient will continue with medical therapy.  It is thought      secondary to his ischemic cardiomyopathy that Coreg would be a good      drug going forward.  2. A 2-D echocardiogram on November 27, 2008, ejection fraction      estimated at 25-35%.  There is akinesis of the mid-to-distal septal      wall and akinesis of the mid-to-distal inferior wall.  There is      diastolic dysfunction.   BRIEF HISTORY:  [name] is a 60 year old male.  He has had 3  separate episodes of lightheadedness and palpitation, the first 2  episodes lasted less than 1 hour, his third episode brought him to the  hospital because it was not terminating.  He presented to the emergency  room on November 26, 2008, and is found in monomorphic ventricular  tachycardia.  He has a past history of anterior myocardial infarction in  2004.  This was a semi-completed myocardial infarction at the time of  diagnosis.  At that time, his LAD was totally occluded and he has been  treated medically for this since.  A year later in February 2005, he  received a Medtronic cardioverter-defibrillator for a ventricular  tachycardia coupled with ischemic cardiomyopathy.  The patient has done  fairly well.  Activity wise, his New York Heart Association Class is II.  He does  get short of breath with exertion.  The patient will be  admitted.  He will be started on IV amiodarone.  Troponin I enzymes will  be cycled.  The patient will probably require repeat left heart  catheterization.   HOSPITAL COURSE:  The patient presents through the emergency room on  November 26, 2008, with monomorphic ventricular tachycardia, which is  recurrent.  The patient is currently taking amiodarone 200 mg daily.  This has recurred despite medical therapy.  The patient was placed on IV  amiodarone.  He has had no recurrence of tachycardia on this  hospitalization.  His troponin I studies were negative x3.  He underwent  left heart catheterization on November 27, 2008.  The study showed  occlusion of the proximal LAD.  This is not new, and the LAD distally is  fed by right-to-left collaterals.  He will continue with medical  therapy.  With his ventricular tachycardia, he has not had an acute on  chronic episode of congestive heart failure on this admission.  The  patient also had a 2-D echocardiogram, which shows ejection fraction 25-  35%.  His cardioverter defibrillator was reprogrammed from a detection  rate of 176 beats per minute down to 120-160 for the initial VT zone.  This was done just prior to his discharge.  Also prior to discharge, he  had lipid profile.  We hope to assess this when he sees Dr. Diona Browner in  office followup.   [name] discharges on the following medications:  1. Amiodarone 200 mg 1 tablet twice daily, this is a new dose.  2. Carvedilol 12.5 mg 1 tab twice daily.  3. Enteric-coated aspirin 81 mg daily.  4. Simvastatin 80 mg daily at bedtime.  5. Lisinopril 40 mg daily.   He is asked to stop Toprol.   He has a followup with [facility] at the [facility] to  see Dr. Diona Browner on Friday, December 13, 2008, at 10 o'clock in the  morning.  The patient says that it is possibly more convenient for him  to be seen by cardiologist at the  [facility].  He will discuss  this with Dr. Diona Browner when he sees him later in January.  Also, he  would like to access his lipid profile, which was drawn here on November 28, 2008, but was not available to him at the time of discharge.   LABORATORY STUDIES AT DISCHARGE:  The complete blood count was white  cells 7.2, hemoglobin 14.8, hematocrit 43.6, and platelets 154.  This is  a post cath laboratory.  Sodium is 140, potassium 4.3, chloride 104,  carbonate 28, BUN is 12, creatinine 1.18, glucose 128.  Pro time on this  admission was 12.9.  INR is 1.0.  Alkaline phosphatase on this admission  is 61, SGOT 29, SGPT is 34, and troponin I studies are 0.01, then 0.01,  then less than 0.01.  Magnesium on this admission was 2.1.  Hgb A1c was  6.2.  TSH 2.803.  D-dimer was 0.55.      Maple Mirza, PA      Doylene Canning. Ladona Ridgel, MD  Electronically Signed    GM/MEDQ  D:  11/28/2008  T:  11/29/2008  Job:  [id number]   cc:   Jonelle Sidle, MD  Dr. Yetta Barre  [facility]

## 2011-04-13 NOTE — Assessment & Plan Note (Signed)
[facility]                          CHRONIC HEART FAILURE NOTE   Lee Hall, Lee Hall                   MRN:          [id number]  DATE:01/17/2009                            DOB:          December 21, 1950    PRIMARY CARDIOLOGIST:  Doylene Canning. Ladona Ridgel, MD   Lee Hall returns today for further followup of his congestive heart  failure, which is secondary to ischemic cardiomyopathy with a known  history of coronary artery disease.  I saw Lee Hall as a new  patient back on January 02, 2009, at which time heart failure education  was initiated.  Adjustments were made in his medication.  He called the  office earlier this week complaining of dizziness and decided to go  ahead and have him come in and get evaluated; however, the day I talked  with him, he is describing chronic dizziness, but is complaining of a  new symptom that is more suggestive of vertigo.  He complains of  dizziness when he turns his head abruptly or changes position.  He  denies any palpitations, although he states he does get anxious when he  experiences this sudden dizziness with changing positions.  He notes  that when he is watching at the computer at his desk on the computer and  he turns his head suddenly to see the TV which is at a 45-degree angle  difference.  The symptoms resolved spontaneously.  He states compliance  with medication, still continues to have the same meals every day.  Essentially, has scrambled eggs, toast, grits, and coffee at least once  a day.  Does not really consume any fresh fruits or vegetables.  He  still living with his sister secondary to financial issues.  I talked  with him earlier this month about proceeding with CPX test for  determining factor regarding his disability.  I think this would be  pertinent information to have as he has ongoing fatigue and dizziness.  He is agreeable to go ahead and proceed with that.   PAST MEDICAL HISTORY:  1.  Congestive heart failure secondary to ischemic cardiomyopathy with      EF of 25-35%.  2. Ventricular tachycardia with a Medtronic Maximo device, recent      hospitalization for firing, adjustments in amiodarone and device      threshold were made at that time.  3. Dyslipidemia.  4. Coronary artery disease.  Most recent cardiac catheterization,      November 27, 2008, LAD totally occluded after first diagonal,      distal LAD, good collaterals from the posterior descending artery.  5. Substrate for mural thrombus.  The patient declined the Coumadin      therapy.  6. Intermittent symptomatic orthostasis.  7. Remote history of tobacco use.  8. History of nonmobile mural thrombus with a completed 82-month course      of Coumadin at 2007.   REVIEW OF SYSTEMS:  As stated above.   CURRENT MEDICATIONS:  Lisinopril, the patient taken 20 mg once a day  now; Pacerone 200 mg b.i.d.; carvedilol, the patient is taking 12.5 mg  in the  morning, 18.375 in the evening.   PHYSICAL EXAMINATION:  VITAL SIGNS:  Weight 211 pounds, blood pressure  118/72, heart rate of 52.  GENERAL:  Lee Hall is in no acute distress.  No signs of jugular  vein distention, currently at a 45 degrees angle.  LUNGS:  Clear to auscultation bilaterally.  CARDIOVASCULAR:  S1 and S2, regular rate and rhythm.  ABDOMEN:  Soft, nontender, positive bowel sounds.  EXTREMITIES:  Lower extremities without clubbing, cyanosis, or edema.  NEUROLOGICAL:  Alert and oriented x3.   IMPRESSION:  Congestive heart failure secondary to ischemic  cardiomyopathy without signs of volume overload.  We will plan on  proceeding with CPX test for further clarification of Lee Hall  symptoms.  I am going to continue the lisinopril to 20 mg at bedtime and  try to bump his carvedilol to 18.375 mg b.i.d.  We will be very cautious  in adjusting his medications as he has ongoing dizziness.  It is very  emotionally responsive to changes in his  physical status.  Once again I  have reinforced 2 g sodium restricted diet and walking for exercise.      Dorian Pod, ACNP  Electronically Signed      Rollene Rotunda, MD, [facility]  Electronically Signed   MB/MedQ  DD: 01/17/2009  DT: 01/18/2009  Job #: [id number]

## 2011-04-13 NOTE — Assessment & Plan Note (Signed)
[facility]                            CARDIOLOGY OFFICE NOTE   MAYO, FAULK                   MRN:          [id number]  DATE:03/25/2008                            DOB:          03/02/1951    REASON FOR VISIT:  Cardiac followup.   HISTORY OF PRESENT ILLNESS:  [name] comes in for a routine visit.  He missed his scheduled visit 6 months ago and I actually last saw him  back in April 2008.  His history is detailed in my previous note.  He  states that he has been unemployed for the last 10 months and has had  financial constraints which have impacted his followup.  He reports  dyspnea on exertion but no angina.  He has NYHA Class II to occasionally  III with increased level of activity.  He has no resting shortness of  breath, however.  He also occasionally feels a fullness and flushing  in his head which may be related to premature ventricular complexes  based on his description.  He has had no device discharges, however, and  had no syncope.  He reports compliance with his medications.   He had a followup echocardiogram done earlier today which demonstrates a  left ventricular ejection fraction of 35-40% with dyskinesis of the  periapical wall and akinesis of the mid to distal anteroseptal wall.  There is also evidence of a laminated thrombus affecting the distal  apical septum.  There has been question of this in the past and he was  previously treated with Coumadin, although this was ultimately  discontinued after 6 months.  This was decided taking into account the  difficulty with maintaining patient compliance with adequate followup,  and also the absence of this specific abnormality on his last  echocardiographic study.  He certainly has a substrate for an apical  thrombus, and we talked about the possibility of reinstituting Coumadin  again today and treating this as a long-term medication.  His main  concern is with ability  to follow up for INR checks due to financial  constraints.  He has followup to see Dr. Ladona Ridgel on May 5 for a device  followup.  He is also due for blood work   ALLERGIES:  No known drug allergies.   PRESENT MEDICATIONS:  1. Lisinopril 40 mg p.o. daily.  2. Crestor 10 mg p.o. daily.  3. Aspirin 81 mg p.o. daily.  4 . Toprol XL 100 mg p.o. daily.  1. Amiodarone 200 mg p.o. daily.   REVIEW OF SYSTEMS:  As described in History of Present Illness,  otherwise negative.   PHYSICAL EXAMINATION:  VITAL SIGNS:  Blood pressure 112/72, heart rate  57. Weight is 208 pounds.  GENERAL:  Overweight male in no acute distress.  HEENT:  Conjunctivae and lids normal.  Oropharynx clear.  NECK:  Supple.  No elevated jugular venous pressure.  No loud bruits or  thyromegaly.  LUNGS:  Clear. Diminished breath sounds at the bases.  CARDIAC:  Exam reveals a regular rate and rhythm.  No S3 gallop.  ABDOMEN:  Soft, nontender, not the bowel  sounds.  EXTREMITIES:  Exhibit trace edema, nonpitting. Distal pulses are 1+.  SKIN:  Warm and dry.  MUSCULOSKELETAL:  No kyphosis noted.  NEUROPSYCHIATRIC:  The patient is alert and oriented x3.  Affect seems  appropriate.   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  I  discussed this with him today.  We talked about resuming Coumadin,  although he does have significant concerns about his ability to follow  up on this medication for INR checks.  These mainly stem from financial  constraints in that he is presently unemployed.  My plan is for him to  discuss the situation further with Dr. Jimmey Ralph in our Coumadin clinic and  see if we can arrive at some type of reasonable followup plan.  If not,  we can a least understand what other alternatives might exist for him  and make an educated decision.   Otherwise I will have him continue his  present medications except change Crestor to generic simvastatin at 80  mg daily.  I suspect this will save him a significant amount of money.  He will need followup labs including lipids, liver function tests,  thyroid function studies, BMET, and a BNP.  He will plan to see Dr.  Ladona Ridgel as scheduled early next month, and we will see him back over the  next 6 months.     Jonelle Sidle, MD  Electronically Signed    SGM/MedQ  DD: 03/25/2008  DT: 03/25/2008  Job #: [id number]

## 2011-04-16 NOTE — Assessment & Plan Note (Signed)
[facility]                            CARDIOLOGY OFFICE NOTE   Lee Hall, Lee Hall                   MRN:          [id number]  DATE:03/06/2007                            DOB:          1951-05-10    REASON FOR VISIT:  Routine cardiac followup.   I last saw Lee Hall back in August 2007. Please see recent  dictations from late February by myself and also Dr. Jimmey Ralph from early  March. Since his last visit, Lee Hall was evaluated at a facility  in [place] where he was visiting as detailed in my previous  dictation. I have records from that evaluation including findings of  relative stability concerning his ejection fraction and ischemic  cardiomyopathy. He apparently had two device discharges and explains to  me that he was told this was due to atrial fibrillation prompting the  use of amiodarone although I cannot find this clearly documented in his  records. Since that time, he has also had a decrease in his Toprol XL to  100 mg daily and reports that symptomatically he has done fairly well.  He has a mild sense of orthostasis if he bends over and stands up  suddenly, but otherwise has had no progressive dyspnea, exertion or  chest pain and furthermore denies any sense of palpitations other than a  tickle he feels in his chest briefly at times. He has had no device  discharges or syncope. Today I spoke with him about a number of issues  including his heart disease management from the perspective of  medications long-term, Coumadin use and the critical need for him to be  compliant with regular followup, and trying to obtain more information  about his evaluation in [place]. He has been on Coumadin more from  the perspective of prophylaxis given his large apical ventricular  aneurysmal abnormality and some shadowing that had been seen in this  area initially although not clearly seen in followup testing. Now that  he has had possibly  atrial fibrillation, he certainly has a stronger  indication for Coumadin long-term. He is due to have device followup  which may also be useful in trying to determine whether he has had any  atrial arrhythmias. I spoke with him about his cholesterol management  and also liver function tests and thyroid function studies since he has  been on amiodarone. Today his INR was elevated and this was adjusted by  Dr. Jimmey Ralph. One of his issues with followup is that he has a residence  primarily in [place] but works essentially all of the time in [place]  and is not here frequently at all. He stays with an acquaintance in  [place] most of the time in fact and has not been in [place] for the  last several weeks. Dr. Jimmey Ralph will be trying to coordinate perhaps more  regular Coumadin followup through a facility in [place].   ALLERGIES:  No known drug allergies.   CURRENT MEDICATIONS:  1. Lisinopril 40 mg p.o. daily.  2. Crestor 20 mg p.o. daily.  3. Aspirin 81 mg p.o. daily.  4. Coumadin as directed by  the Coumadin clinic.  5. Toprol XL 100 mg p.o. daily.  6. Amiodarone 100 mg p.o. daily.   REVIEW OF SYSTEMS:  As described in the history of present illness.   PHYSICAL EXAMINATION:  VITAL SIGNS:  Blood pressure today is 128/78,  heart rate is 60, weight is 202 pounds which is relatively stable.  GENERAL:  The patient is comfortable and in no acute distress.  HEENT:  Conjunctiva was normal, oropharynx is clear.  NECK:  Supple without elevated jugular venous pressure or loud bruits.  No thyromegaly is noted.  LUNGS:  Clear without labored breathing at rest.  CARDIAC:  Regular rate and rhythm without S3 gallop or pericardial rub.  CHEST:  Wall is stable with device site intact.  ABDOMEN:  Soft, no bruits.  EXTREMITIES:  Exhibit no significant pitting edema.  SKIN:  Warm and dry.  MUSCULOSKELETAL:  No kyphosis noted.  NEUROPSYCHIATRIC:  The patient is alert and oriented x3.   A 12-lead  electrocardiogram shows sinus bradycardia with evidence of  previous anterior wall myocardial infarction as outlined previously.   IMPRESSION/RECOMMENDATIONS:  1. Ischemic cardiomyopathy with an ejection fraction of approximately      25% associated with an anteroapical aneurysmal deformity. We will      plan to continue his present medical regimen. I have underscored      with him the critical importance of regular followup of Coumadin      and he states that he will work on this more diligently. We also      planned a followup with Dr. Ladona Hall in the device clinic and in the      interim will attempt to obtain more specific records regarding his      evaluation in [place] particularly as it relates to findings of      atrial fibrillation. Liver function tests and thyroid function      studies will also be obtained given his ongoing amiodarone therapy.  2. Hyperlipidemia, on statin therapy. Followup lipids will be      obtained.  3. Routine visit over the next 6 months assuming he remains      symptomatically stable. Further plans to follow.     Lee Sidle, MD  Electronically Signed    SGM/MedQ  DD: 03/06/2007  DT: 03/06/2007  Job #: [id number]   cc:   Lee Hall. Lee Ridgel, MD

## 2011-04-16 NOTE — Assessment & Plan Note (Signed)
[facility]                 COUMADIN / CHRONIC HEART FAILURE CLINIC NOTE   Lee Hall, Lee Hall                   MRN:          [id number]  DATE:02/03/2007                            DOB:          1951/06/15    [name] is a patient followed in the anticoagulation clinic, due  to his history of ischemic cardiomyopathy, reduced ejection fraction of  25-35% and previous anterior wall myocardial infarction, treated in  [place] in October 2004.  The patient was started on Coumadin last year,  due to an apical aneurysm and possible mural thrombus.  He is followed  here by Dr. Simona Huh for general cardiology care and Dr. Lewayne Bunting for device clinic care.  [name] has not followed up in the  anticoagulation clinic for some time.  I understand through detective  work that he was treated in November in [place].  When we last saw  him on September 26, 2006, the patient stated he would not make an  appointment, but he would call for followup when he returned to town.  We did give the patient a prescription to have his INR checked in a  laboratory in [place] with results to be faxed to [place], but he did not  comply with this, based on our records and followup attempts.  We have  mailed several letters to the patient, as he has been not seen in  clinic, and these have not rendered a response at this time.   I have contacted the patient's local pharmacy, which is [facility]  [place], and in calling the pharmacy, I have learned that the  patient's prescriptions were transferred to the [facility] in  [place], telephone number [phone].  The patient's warfarin was  placed on hold by Edison Pace, the pharmacist and Lesle Reek, Pensions consultant.  The  patient's drug had been picked up prior to my catching this on February  26, but future refills have been placed on hold.  This plan has been  discussed with Dr. Diona Browner, who is in agreement.  I  have, again, left  voice mail for [name] regarding my actions and have left  instructions at [facility] that he is to contact the office at  [phone], and ask to speak with me or Dr. Ival Bible nurse.     Shelby Dubin, PharmD, BCPS, CPP  Electronically Signed    MP/MedQ  DD: 02/03/2007  DT: 02/03/2007  Job #: [id number]   cc:   Jonelle Sidle, MD

## 2011-04-16 NOTE — Assessment & Plan Note (Signed)
[facility]                           ELECTROPHYSIOLOGY OFFICE NOTE   OMARII, SCALZO                   MRN:          [id number]  DATE:06/20/2006                            DOB:          07/06/1951    Mr. Boeh was seen today in the clinic on June 20, 2006, per patient  walk-in complaining of an ICD discharge on Saturday, July 21.  On  interrogation of his device today, his battery voltage is 3.10 with a charge  time of 7.47 seconds.  P waves measured 4.3 mV with an atrial capture  threshold of 1 V at 0.2 msec and an atrial lead impedance of 536.  R waves  measured 6.9 mV with a ventricular capture threshold of 1 V at 0.2 msec and  a ventricular lead impedance of 464.  Shock impedance was 60.  There was one  VF episode treated with a shock effectively.  No other episodes noted.  No  changes were made in his parameters.  He is going to be set up to see Dr.  Ladona Ridgel in the near future to establish.  This gentleman is from [place] and  was seen by Dr. Diona Browner originally and by Elnita Maxwell back in March and was to  establish then with one of our electrophysiologists and was a no-show for  that appointment, so we have again set him up to see Dr. Ladona Ridgel and,  hopefully, he will keep this apt and we can get him on a regular schedule.                                   Altha Harm, LPN                                Doylene Canning. Ladona Ridgel, MD   PO/MedQ  DD:  06/20/2006  DT:  06/20/2006  Job #:  [id number]

## 2011-04-16 NOTE — Assessment & Plan Note (Signed)
[facility]                            CARDIOLOGY OFFICE NOTE   Lee Hall, Lee Hall                   MRN:          [id number]  DATE:01/26/2007                            DOB:          16-Apr-1951    I last saw this patient in the office for a routine follow up back in  August of 2007.  His history is detailed in my most recent note,  including an ischemic cardiomyopathy with an ejection fraction in the  range of 25-35%, status post prior late presentation anterior wall  myocardial infarction at a facility in [place] back in October of 2004.  He is status post placement of a [employer] defibrillator in February of  2005, also in [place], and established follow up with our practice back  in February of 2007.  At that time a baseline echocardiogram was  obtained which revealed evidence of a large apical aneurysm with some  shadowing towards the apex, possibly indicative of mural thrombus.  These results were conveyed to Dr. Corinda Gubler, and the patient was placed  on Coumadin.  I reviewed the echocardiogram and elected to continue  Coumadin at that time.  I discussed this issue with the patient, and we  ultimately repeated an echocardiogram in July of 2007 which did not  demonstrate any obvious thrombus, but continued to show a large  aneurysmal deformity of the entire periapical wall.  After discussing  the issue, we elected to continue Coumadin at that point, given the  patient's likelihood of developing further thrombus.  He has also been  followed by Dr. Ladona Ridgel in our device clinic and has had one appropriate  ICD discharge for ventricular fibrillation.   I have not seen the patient back since August of 2007, and he is due for  a follow up visit.  I did receive records recently from [facility] in [place], [place] documenting evaluation back in  November of 2007 actually through January of this year for a history of  palpitations,  chest pain and apparently syncope.  It seems that he  underwent a myocardial perfusion study which showed no clear ischemia,  but evidence of a very large periapical infarction which is consistent  with his history.  His ejection fraction was also noted to be in a  similar range at 30%.  Holter monitoring demonstrated atrial and  ventricular ectopy including some nonsustained ventricular tachycardia,  and it seems that he was placed on amiodarone by a cardiologist in  [place], Dr. Alcide Clever.  Based on records, he also is apparently  continuing on Coumadin.   I was informed by Dr. Jimmey Ralph in our Coumadin clinic that the patient has  not had any regular recent follow up of his Coumadin, and that several  attempts have been made to contact him regarding this, including  certified mail.  He apparently still maintains a [place] address,  although the presumption is he may be living in a different area.  Dr.  Jimmey Ralph has been able to ascertain that the patient is still getting  refills through a pharmacy in [place], [place] however.  Further  refills have  been placed on hold with instructions for the patient to call us should  he  come in for prescription refills.  I have also asked my nurse to  continue to make attempts to arrange follow up for the patient so that  we can sort through these issues in more detail.     Jonelle Sidle, MD  Electronically Signed    SGM/MedQ  DD: 01/26/2007  DT: 01/26/2007  Job #: [id number]   cc:   Doylene Canning. Ladona Ridgel, MD  Shelby Dubin, PharmD, BCPS, CPP

## 2011-04-16 NOTE — Letter (Signed)
July 25, 2006     Neta Mends. Fabian Sharp, MD  [address] [place], [place] [address]   RE:  ODIES, DESA  MRN:  [id number]  /  DOB:  May 25, 1951   Dear Burna Mortimer:   This letter is a letter of introduction to a patient who I follow with Dr.  Simona Huh for ongoing primary care followup.  The patient is a 60-year-  old man who is status post myocardial infarction just about 2 years ago for  which he ultimately underwent ICD implantation.  He has an ischemic  cardiomyopathy and was not thought to be a bypass candidate secondary to his  completed MI.  He did undergo ICD implantation over a year and a half ago  and decided subsequently to move from [place] to [place] where he  was initially born and raised.  The patient has had one appropriate ICD  shock secondary to VT/VF.  The patient does not have a primary care  physician and I am writing this letter in introduction to him.  He does have  a remote history of dyslipidemia and his medications include lisinopril,  Toprol, Crestor and aspirin.  He is also on Coumadin although I do not quite  understand the indication for this at the present time.   Please do not hesitate to contact me with regard to Mr. Westrich.    Sincerely,     Doylene Canning. Ladona Ridgel, MD   GWT/MedQ  DD:  07/25/2006  DT:  07/26/2006  Job #:  [id number]

## 2011-04-16 NOTE — Assessment & Plan Note (Signed)
[facility]                           ELECTROPHYSIOLOGY OFFICE NOTE   Lee, Hall                   MRN:          [id number]  DATE:07/25/2006                            DOB:          12/13/1950    [name] is referred today by Dr. Simona Hall to establish followup as  he has a defibrillator in place.   HISTORY OF PRESENT ILLNESS:  The patient is a 60 year old man who has been  otherwise healthy until 2005 when he had an out-of-hospital myocardial  infarction.  Subsequent evaluation demonstrated no viability, and he was  placed on maximum medical therapy including ACE inhibitors, beta blockers,  Crestor and aspirin.  The patient subsequently underwent prophylactic ICD  implantation down in [place], [place] in early 2006.  He subsequently  moved back to [place] where he was initially born and is now referred for  additional evaluation.  The patient notes that he had an appropriate ICD  discharge several months ago and came to our office where he was found to  have an episode of VF for which he was successfully resuscitated.  He has  had no recurrent episodes, denies chest pain or shortness of breath or  syncope.  He does note with strenuous exertion that he becomes short of  breath and has to stop and rest.  He denies peripheral edema.  He denies  cough or hemoptysis.   PAST MEDICAL HISTORY:  Notable for dyslipidemia.   SOCIAL HISTORY:  The patient is married.  He denies tobacco use.  He denies  alcohol abuse.   FAMILY HISTORY:  Noncontributory.   REVIEW OF SYSTEMS:  The patient as noted has dyspnea on exertion and  shortness of breath but no resting symptoms.  He denies anginal symptoms.  He denies nausea, vomiting, diarrhea or constipation, polyuria, polydipsia,  heat or cold intolerance, recent weight changes.  He denies skin problems,  arthritic complaints or neurologic problems.  The rest of his review of  systems  was reviewed and found to be negative.   PHYSICAL EXAMINATION:  He is a pleasant, well-appearing, middle-aged man in  no distress.  Blood pressure 120/86, pulse 64 and regular, respirations are  18.  The weight was 205 pounds.  HEENT:  Normocephalic and atraumatic.  Pupils equal and round.  Oropharynx  is moist.  The sclerae are anicteric.  NECK:  Revealed no jugular venous distention.  LUNGS:  Clear bilaterally to auscultation.  There were no wheezes, rales or  rhonchi.  No increased work of breathing.  CARDIOVASCULAR:  Revealed a regular rate and rhythm with normal S1 and S2.  The PMI was laterally displaced and enlarged.  I do not appreciate any  murmurs today.  ABDOMEN:  Soft, nontender, nondistended.  There is no organomegaly.  Bowel  sounds are present.  There is no rebound or guarding.  EXTREMITIES:  Demonstrated no cyanosis, clubbing or edema.  The pulses were  2+ and symmetric.  NEUROLOGIC:  Alert and oriented x3 with cranial nerves intact.  The strength  is 5/5 and symmetric.   The EKG demonstrates sinus rhythm with an extensive anterior  and  anterolateral myocardial infarction.   IMPRESSION:  1. Ischemic cardiomyopathy.  2. Ventricular tachycardia/ventricular fibrillation.  3. Status post implantable cardioverter defibrillator insertion.  4. Congestive heart failure, presently class 2.   DISCUSSION:  Overall, [name] is stable.  His defibrillator is working  normally.  He has requested establishment with a primary care physician, and  I plan to refer him to see Dr. Berniece Hall in our [place] office.                                   Doylene Canning. Lee Ridgel, MD   GWT/MedQ  DD:  07/25/2006  DT:  07/26/2006  Job #:  [id number]   cc:   Lee Hall. Lee Sharp, MD

## 2011-04-16 NOTE — Assessment & Plan Note (Signed)
[facility]                         ELECTROPHYSIOLOGY OFFICE NOTE   Lee Hall, Lee Hall                   MRN:          [id number]  DATE:03/21/2007                            DOB:          [birth year]    Lee Hall returns today for IC follow up.  He is a very pleasant,  middle aged male with ischemic cardiomyopathy and ventricular  tachycardia who underwent ICD implantation down in [place] in 2006.  He  subsequently moved back to [place].  He notes that he was vacationing  out in [place] when he received an ICD shock and went to the  emergency department.  He as hospitalized and begun on Amiodarone.  Ultimately, he was discharged and returns today for follow up.  The  patient did not see an electrophysiologist when he was in [place].  He specifically denies chest pain or shortness of breath today.  He is,  however, quite anxious about activities and what he can and cannot do.  He is afraid to walk.  He is afraid to do much of anything.   MEDICATIONS:  1. Lisinopril 40 mg daily.  2. Crestor 20 mg daily.  3. Aspirin.  4. Coumadin.  5. Toprol XL 100 mg daily.  6. Amiodarone 200 mg daily.   PHYSICAL EXAMINATION:  GENERAL:  Pleasant, well-appearing, middle-aged  man in no acute distress.  VITAL SIGNS:  Blood pressure 135/79, pulse 60 and regular, respirations  18, weight 203 pounds.  NECK:  No jugular venous distention.  LUNGS:  Clear bilateral to auscultation.  No wheezes, rales or rhonchi  present.  CARDIOVASCULAR:  Regular rate and rhythm with normal S1, S2.  EXTREMITIES:  No edema.   On interrogation of his defibrillator, he has a Medtronic Maxima with DR  with PNR at 3 and 6, respectively, the impedence 536 in the atrium, 464  in the ventricle.  Threshold of 0.2 in both the atrium and 0.3 in the  right ventricle.  The battery voltage was 3.1 in volts.  He was 100% A-  sensed, V-sensed.  Today, we reprogrammed his device after  interrogation  demonstrated that he had not sustained VT for which he was shocked twice  by increasing the number to detect in the VT zone and in the fast VT  zone.  We also increased his detection rate from 167-176 in the VT zone.   IMPRESSION:  1. Ischemic cardiomyopathy.  2. Ventricular tachycardia.  3. Status post ICD insertion.   DISCUSSION:  Overall, Lee Hall is stable.  He is anxious and has  spent a considerable amount of time today discussing what he can and  cannot do and how that might relate to his defibrillator.  He had many  questions.  I plan to see him back in the office in 3-4 months for  additional follow up of his ICD and ongoing treatment of his ventricular  tachycardia.     Doylene Canning. Ladona Ridgel, MD  Electronically Signed    GWT/MedQ  DD: 03/21/2007  DT: 03/21/2007  Job #: [id number]

## 2011-04-16 NOTE — Assessment & Plan Note (Signed)
[facility]                              CARDIOLOGY OFFICE NOTE   LADARRELL, CORNWALL                   MRN:          [id number]  DATE:07/06/2006                            DOB:          [birth year]    REASON FOR VISIT:  Routine cardiac followup.   HISTORY OF PRESENT ILLNESS:  I saw [name] last in February.  His  history is outlined in my previous note.  Since then, I have received  records from his prior cardiology practice in [place] and reviewed these.  He does have history of an ischemic cardiomyopathy with an ejection fraction  most recently assessed in the 25% to 35% range, associated with an apical  aneurysm.  There was a question of a possible mural thrombus based on his  initial echocardiogram, although this was not described on the most recent  one, having been on Coumadin for the last six months.  Symptomatically, he  is not having any angina, but does have NYHA class II shortness of breath  with activity.  He states that he had a defibrillator discharge a few weeks  ago that woke him from sleep and, apparently, he came into the office to  have this interrogated.  He has been scheduled to see Dr. Graciela Husbands for  electrophysiology followup, although this has not yet occurred and he is  scheduled to Dr. Ladona Ridgel later this month to establish.  His  electrocardiogram today demonstrates evidence of previous large anterior  wall myocardial infarction and no marked changes compared to his previous  tracing in February.  Lipid profile from that time demonstrated an LDL  cholesterol of 67 and he continues on the regimen outlined below without  significant difficulties.  Today, we had a fairly long discussion about the  pathophysiology of coronary artery disease, cardiomyopathy, malignant  dysrhythmias, and reasons for defibrillator in this case.  We also talked  about medical therapy, a basic exercise regimen beginning with a stationery  bicycle which he already owns, and general symptom review.   ALLERGIES:  No known drug allergies.   PRESENT MEDICATOINS:  1.  Aspirin 81 mg p.o. daily.  2.  Coumadin presently at 5 mg p.o. daily.  3.  Crestor 20 mg p.o. daily.  4.  Toprol XL 200 mg p.o. daily.  5.  Lisinopril 40 mg p.o. daily.   REVIEW OF SYSTEMS:  As in history of present illness, otherwise negative.   PHYSICAL EXAMINATION:  VITAL SIGNS:  Blood pressure 126/84, heart rate 67.  Weight 203 pounds.  GENERAL:  The patient is comfortable at rest without active symptoms.  NECK:  Examination of the neck reveals no elevated jugular venous pressure  without bruits.  No thyromegaly is noted.  LUNGS:  Clear without labored breathing at rest.  CARDIAC:  Reveals a regular rate and rhythm without loud murmur or S3  gallop.  CHEST:  Reveals a well-healed defibrillator pocket site on the left upper  thorax.  ABDOMEN:  Soft with no loud bruit.  EXTREMITIES:  Exhibit no significant pitting edema.   IMPRESSION AND RECOMMENDATIONS:  1.  Ischemic cardiomyopathy, status  post prior late-presentation anterior      wall myocardial infarction in October 2004, managed medically without      revascularization and now status post placement of a Medtronic      defibrillator in February 2005.  Ejection fraction is in the 25% to 35%      range by most recent echocardiogram with aneurysmal deformity of the      periapex.  Present medical regimen will be continued  including Coumadin      for the time being.  He is also scheduled to follow up with Dr. Ladona Ridgel      and establish regular electrophysiology/device followup.  He reports one      device discharge since it was placed and has no typical palpitations or      syncope.  2.  Hyperlipidemia, well controlled on Crestor.  Will follow lipid profile      and liver function tests at his next visit.  3.  Routine followup in six months.                                Jonelle Sidle,  MD    SGM/MedQ  DD:  07/06/2006  DT:  07/06/2006  Job #:  [id number]   cc:   Silas Sacramento, MD

## 2011-09-03 LAB — D-DIMER, QUANTITATIVE: D-Dimer, Quant: 0.55 ug/mL-FEU — ABNORMAL HIGH (ref 0.00–0.48)

## 2011-09-03 LAB — CBC
HCT: 43.6 % (ref 39.0–52.0)
HCT: 45.7 % (ref 39.0–52.0)
Hemoglobin: 14.8 g/dL (ref 13.0–17.0)
Hemoglobin: 15.6 g/dL (ref 13.0–17.0)
Platelets: 160 10*3/uL (ref 150–400)
RBC: 4.79 MIL/uL (ref 4.22–5.81)
RBC: 4.97 MIL/uL (ref 4.22–5.81)
RDW: 13.1 % (ref 11.5–15.5)
WBC: 6.5 10*3/uL (ref 4.0–10.5)
WBC: 7.2 10*3/uL (ref 4.0–10.5)

## 2011-09-03 LAB — BASIC METABOLIC PANEL
CO2: 26 mEq/L (ref 19–32)
GFR calc Af Amer: >60 mL/min (ref >60)
GFR calc non Af Amer: >60 mL/min (ref >60)
GFR calc non Af Amer: >60 mL/min (ref >60)
Glucose, Bld: 128 mg/dL — ABNORMAL HIGH (ref 70–99)
Glucose, Bld: 135 mg/dL — ABNORMAL HIGH (ref 70–99)
Potassium: 4.3 mEq/L (ref 3.5–5.1)
Potassium: 4.5 mEq/L (ref 3.5–5.1)
Sodium: 139 mEq/L (ref 135–145)
Sodium: 140 mEq/L (ref 135–145)

## 2011-09-03 LAB — COMPREHENSIVE METABOLIC PANEL
ALT: 34 U/L (ref 0–53)
Alkaline Phosphatase: 61 U/L (ref 39–117)
BUN: 17 mg/dL (ref 6–23)
CO2: 27 mEq/L (ref 19–32)
Chloride: 101 mEq/L (ref 96–112)
GFR calc non Af Amer: >60 mL/min (ref >60)
Glucose, Bld: 176 mg/dL — ABNORMAL HIGH (ref 70–99)
Potassium: 3.4 mEq/L — ABNORMAL LOW (ref 3.5–5.1)
Sodium: 136 mEq/L (ref 135–145)
Total Bilirubin: 0.7 mg/dL (ref 0.3–1.2)

## 2011-09-03 LAB — CARDIAC PANEL(CRET KIN+CKTOT+MB+TROPI)
CK, MB: 1.7 ng/mL (ref 0.3–4.0)
CK, MB: 2 ng/mL (ref 0.3–4.0)
CK, MB: 2.1 ng/mL (ref 0.3–4.0)
Relative Index: INVALID (ref 0.0–2.5)
Total CK: 36 U/L (ref 7–232)
Total CK: 95 U/L (ref 7–232)

## 2011-09-03 LAB — DIFFERENTIAL
Basophils Absolute: 0 10*3/uL (ref 0.0–0.1)
Basophils Relative: 0 % (ref 0–1)
Eosinophils Absolute: 0.2 10*3/uL (ref 0.0–0.7)
Monocytes Relative: 10 % (ref 3–12)
Neutrophils Relative %: 52 % (ref 43–77)

## 2011-09-03 LAB — LIPID PANEL
HDL: 31 mg/dL — ABNORMAL LOW (ref >39)
LDL Cholesterol: 52 mg/dL (ref 0–99)
Total CHOL/HDL Ratio: 4.3 RATIO
Triglycerides: 243 mg/dL — ABNORMAL HIGH (ref <150)
VLDL: 49 mg/dL — ABNORMAL HIGH (ref 0–40)

## 2011-09-03 LAB — POCT CARDIAC MARKERS: Myoglobin, poc: 73.3 ng/mL (ref 12–200)

## 2011-09-03 LAB — PROTIME-INR: INR: 1 (ref 0.00–1.49)

## 2011-09-03 LAB — MAGNESIUM: Magnesium: 2.1 mg/dL (ref 1.5–2.5)

## 2011-09-03 LAB — APTT: aPTT: 33 seconds (ref 24–37)

## 2011-11-24 ENCOUNTER — Encounter: Payer: Self-pay | Admitting: Cardiology

## 2017-11-29 DIAGNOSIS — C439 Malignant melanoma of skin, unspecified: Secondary | ICD-10-CM

## 2017-11-29 HISTORY — DX: Malignant melanoma of skin, unspecified: C43.9

## 2017-11-29 HISTORY — PX: SKIN BIOPSY: SHX1

## 2018-02-27 ENCOUNTER — Encounter: Payer: Self-pay | Admitting: Cardiology

## 2018-02-28 ENCOUNTER — Encounter: Payer: Self-pay | Admitting: Cardiology

## 2018-02-28 ENCOUNTER — Ambulatory Visit (INDEPENDENT_AMBULATORY_CARE_PROVIDER_SITE_OTHER): Payer: Self-pay | Admitting: Cardiology

## 2018-02-28 ENCOUNTER — Encounter (INDEPENDENT_AMBULATORY_CARE_PROVIDER_SITE_OTHER): Payer: Self-pay

## 2018-02-28 VITALS — BP 138/78 | HR 65 | Ht 71.0 in | Wt 189.2 lb

## 2018-02-28 DIAGNOSIS — I472 Ventricular tachycardia, unspecified: Secondary | ICD-10-CM

## 2018-02-28 DIAGNOSIS — E785 Hyperlipidemia, unspecified: Secondary | ICD-10-CM

## 2018-02-28 DIAGNOSIS — I2510 Atherosclerotic heart disease of native coronary artery without angina pectoris: Secondary | ICD-10-CM

## 2018-02-28 DIAGNOSIS — I255 Ischemic cardiomyopathy: Secondary | ICD-10-CM

## 2018-02-28 NOTE — Patient Instructions (Addendum)
Medication Instructions:  Your physician recommends that you continue on your current medications as directed. Please refer to the Current Medication list given to you today.  Labwork: None ordered  Testing/Procedures: Your physician has recommended a generator change (battery change) and an upgrade to a Bi-Ventricular defibrillator.  The nurse will call you to arrange this procedure.   Follow-Up: To be determined  * If you need a refill on your cardiac medications before your next appointment, please call your pharmacy.   *Please note that any paperwork needing to be filled out by the provider will need to be addressed at the front desk prior to seeing the provider. Please note that any FMLA, disability or other documents regarding health condition is subject to a $25.00 charge that must be received prior to completion of paperwork in the form of a money order or check.  Thank you for choosing [facility]!!   Trinidad Curet, RN [phone]  Any Other Special Instructions Will Be Listed Below (If Applicable).  Please call [facility] to inquire about financial assistance

## 2018-02-28 NOTE — Progress Notes (Signed)
Electrophysiology Office Note   Date:  02/28/2018   ID:  Lee Hall, DOB 01-May-1951, MRN [id number]  PCP:  Patient, No Pcp Per  Cardiologist:   Primary Electrophysiologist: Melane Windholz Meredith Leeds, MD    Chief Complaint  Patient presents with  . Defib Check    Ischemic cardiomyopathy/Ventricular tachycardia     History of Present Illness: Lee Hall is a 67 y.o. male who is being seen today for the evaluation of VT, PVCs, CHF at the request of [place]. Presenting today for electrophysiology evaluation.  History of hypertension, type 2 diabetes, coronary disease status post MI in 2005 with an ICD implanted for VT as well as an ischemic cardiomyopathy.  He has a chronic LAD occlusion.  He has had therapy from his defibrillator.  His most recent episode of VT was 3 in February 2019.  His sotalol was increased to 160 mg twice a day.  He was discharged from the hospital, before days later he continued to have more episodes of VT.  He continued to have VT and thus amiodarone was started.  He had been on amiodarone in the past but was stopped due to thyroid issues.  He was also started on mexiletine.  He had a upper extremity ultrasound which showed nearly occlusive thrombus of the right radial and ulnar veins which are deep veins as well as superficial thrombosis of the right cephalic and basilic veins.  There was good compressibility and color flow of the right internal jugular, subclavian, axillary, and brachial veins and he also had abnormal LFTs and a right upper quadrant ultrasound showing an enlarged echogenic liver consistent with fatty infiltration versus hepatocellular disease as well as gallbladder sludge.  All in the hospital, he apparently had a left heart catheterization that showed a chronically occluded LAD as well as an attempt at ablation for his ventricular arrhythmias which was, according to the patient, unsuccessful.    Today, he denies symptoms of palpitations,  chest pain, shortness of breath, orthopnea, PND, lower extremity edema, claudication, dizziness, presyncope, syncope, bleeding, or neurologic sequela. The patient is tolerating medications without difficulties.  Has had no further ventricular arrhythmias to his knowledge.  He is quite anxious, but otherwise is feeling well.   Past Medical History:  Diagnosis Date  . Anxiety   . CAD (coronary artery disease)   . Encounter for long-term (current) use of high-risk medication   . Heart failure, systolic, acute on chronic ([place])   . ICD (implantable cardiac defibrillator), single, in situ   . Long term current use of aspirin   . MI (myocardial infarction) ([place]) 08/2003  . Ventricular tachycardia ([place])    No past surgical history on file.   Current Outpatient Medications  Medication Sig Dispense Refill  . acyclovir (ZOVIRAX) 800 MG tablet Take 800 mg by mouth daily.    Marland Kitchen amiodarone (PACERONE) 200 MG tablet Take 200 mg by mouth daily.      Marland Kitchen aspirin 81 MG tablet Take 81 mg by mouth daily.      . carvedilol (COREG) 6.25 MG tablet Take 6.25 mg by mouth 2 (two) times daily with a meal.    . glimepiride (AMARYL) 2 MG tablet Take 2 mg by mouth 3 (three) times daily.    Marland Kitchen ipratropium (ATROVENT) 0.06 % nasal spray Place 2 sprays into both nostrils 3 (three) times daily.    Marland Kitchen lisinopril (PRINIVIL,ZESTRIL) 10 MG tablet Take 10 mg by mouth daily.    Marland Kitchen mexiletine (MEXITIL) 150 MG capsule  Take 150 mg by mouth 3 (three) times daily.     No current facility-administered medications for this visit.     Allergies:   Patient has no known allergies.   Social History:  The patient  reports that he has quit smoking. He has never used smokeless tobacco. He reports that he drinks alcohol. He reports that he does not use drugs.   Family History:  The patient's family history includes Alcohol abuse in his other; Kidney Stones in his father.    ROS:  Please see the history of present illness.   Otherwise,  review of systems is positive for fatigue, shortness of breath, chest discomfort.   All other systems are reviewed and negative.    PHYSICAL EXAM: VS:  BP 138/78   Pulse 65   Ht 5\' 11"  (1.803 m)   Wt 189 lb 3.2 oz (85.8 kg)   SpO2 97%   BMI 26.39 kg/m  , BMI Body mass index is 26.39 kg/m. GEN: Well nourished, well developed, in no acute distress  HEENT: normal  Neck: no JVD, carotid bruits, or masses Cardiac: RRR; no murmurs, rubs, or gallops,no edema  Respiratory:  clear to auscultation bilaterally, normal work of breathing GI: soft, nontender, nondistended, + BS MS: no deformity or atrophy  Skin: warm and dry,  device pocket is well healed Neuro:  Strength and sensation are intact Psych: euthymic mood, full affect  EKG:  EKG is ordered today. Personal review of the ekg ordered shows sinus rhythm, left bundle branch block   Device interrogation is reviewed today in detail.  See PaceArt for details.   Recent Labs: No results found for requested labs within last 8760 hours.    Lipid Panel     Component Value Date/Time   CHOL  11/28/2008 1350    132        ATP III CLASSIFICATION:  <200     mg/dL   Desirable  200-239  mg/dL   Borderline High  >=240    mg/dL   High   TRIG 243 (H) 11/28/2008 1350   HDL 31 (L) 11/28/2008 1350   CHOLHDL 4.3 11/28/2008 1350   VLDL 49 (H) 11/28/2008 1350   LDLCALC  11/28/2008 1350    52        Total Cholesterol/HDL:CHD Risk Coronary Heart Disease Risk Table                     Men   Women  1/2 Average Risk   3.4   3.3   LDLDIRECT 93.7 03/26/2008 0837     Wt Readings from Last 3 Encounters:  02/28/18 189 lb 3.2 oz (85.8 kg)  03/19/09 209 lb (94.8 kg)  02/18/09 206 lb (93.4 kg)      Other studies Reviewed: Additional studies/ records that were reviewed today include: TTE 01/25/18 Review of the above records today demonstrates:  EF 25-35% apical akinesis  borderline left atrial enlargement  mild mitral regurgitation Trace  tricuspid regurgitation   ASSESSMENT AND PLAN:  1.  VT storm: Is currently on amiodarone and mexiletine.  He has had quite a bit of ventricular arrhythmias which has since calmed down significantly since these medications were started.  He does have a Medtronic dual-chamber ICD that is unfortunately reached RRT.  He has a left bundle branch block and thus would likely benefit from ICD upgrade to CRT.  Unfortunately, the patient is uninsured at the moment and is getting Medicare in September 2019.  We Fanchon Papania work to figure out a way to have him covered to have a device upgrade and generator change as this is would be for secondary prevention.  2.  Ischemic cardiomyopathy: Is on optimal medical therapy with lisinopril and carvedilol.  Based on his current symptoms, would like to switch him to [facility], though I am concerned about his ability to afford the medication as he is uninsured.  We Susie Ehresman wait until he has Medicare to start this medication.  We Neri Samek refer him over to heart failure clinic as they may have avenues for more advanced therapies.  3.  Coronary artery disease: Most recent catheterization showed chronic LAD occlusion.  Patient not currently having chest pain.  LAD has apparently been included for some time.  4.  Hyperlipidemia: Continue simvastatin  Have given the patient phone numbers for primary care.  Over an hour of time was used in patient care.  Half the time was used discussing with the patient.  Current medicines are reviewed at length with the patient today.   The patient does not have concerns regarding his medicines.  The following changes were made today:  none  Labs/ tests ordered today include:  Orders Placed This Encounter  Procedures  . EKG 12-Lead     Disposition:   FU with Halston Kintz 3 months  Signed, Elaya Droege Meredith Leeds, MD  02/28/2018 3:26 PM     [place] [address] [place] [address] [phone]  (office) [phone] (fax)

## 2018-03-01 ENCOUNTER — Other Ambulatory Visit: Payer: Self-pay | Admitting: Cardiology

## 2018-03-02 ENCOUNTER — Telehealth: Payer: Self-pay | Admitting: Licensed Clinical Social Worker

## 2018-03-02 NOTE — Telephone Encounter (Signed)
CSW received referral from [place] to assist with finances and resources. CSW contacted patient and left message for return call. Raquel Sarna, [place], [place]

## 2018-03-03 ENCOUNTER — Telehealth (HOSPITAL_COMMUNITY): Payer: Self-pay | Admitting: Internal Medicine

## 2018-03-03 NOTE — Telephone Encounter (Signed)
Called patient and left message to call our office.  Need to give patient appt info for New Pt appt with Dr. Haroldine Laws.

## 2018-03-09 ENCOUNTER — Telehealth: Payer: Self-pay | Admitting: Licensed Clinical Social Worker

## 2018-03-09 NOTE — Telephone Encounter (Signed)
CSW referred to assist patient with insurance options. Patient reports he receives [employer] retirement but not eligible for [employer] until September, 2019. Patient states he resides with his sister and recently moved back from [place]. He has attempted to explore options through [place] although coverage does not include pre existing conditions and the deductible amounts exceeded his financial means. CSW discussed medicaid although patient is over the income limits and the time frame for application for deductible medicaid may be months pending his unpaid medical bills. CSW advised patient to call financial assistance for further advice on the [place] program. Patient verbalizes understanding and will follow up. CSW available as needed. Raquel Sarna, [place], [place]

## 2018-03-10 ENCOUNTER — Encounter: Payer: Self-pay | Admitting: Internal Medicine

## 2018-03-29 HISTORY — PX: ABLATION: SHX5711

## 2018-04-04 LAB — PROTIME-INR: INR: 1 (ref 0.9–1.1)

## 2018-04-17 ENCOUNTER — Encounter: Payer: Self-pay | Admitting: Cardiology

## 2018-04-19 ENCOUNTER — Encounter (HOSPITAL_COMMUNITY): Payer: Self-pay | Admitting: Internal Medicine

## 2018-04-20 NOTE — Telephone Encounter (Signed)
Pt first seen on 02/28/18 to re-establish with device follow up (originally implanted in '05 by Dr. Lovena Le and then pt moved to [facility]).   Pt device was nearing ERI at April OV w/ pt experiencing recent VT storm earlier this year. Pt was going to call office when device alerted him. Pt did not have insurance d/t recent move to [facility].  Social worker attempted to help pt find financial help to pay for upcoming device generator change need.  He was unsuccessful in finding financial assistance. However, he was able to get financial assistance through his former cardiology office in [place], [place].  He went back down there earlier this month.   He underwent a device/system change out 04/07/18.  They upgraded his device to CRT-D w/ [employer] (previous ICD system was MDT).  He also is currently set up with [employer] for home monitoring.  Pt plans to be back in the area end of June/July.  He will contact office when back to establish follow up with our office (he may re-establish w/ previous physician, Dr. Lovena Le) Pt appreciates me following up.

## 2018-05-02 ENCOUNTER — Encounter: Payer: Self-pay | Admitting: Cardiology

## 2018-05-17 ENCOUNTER — Encounter (INDEPENDENT_AMBULATORY_CARE_PROVIDER_SITE_OTHER): Payer: Self-pay

## 2018-07-10 DIAGNOSIS — M5136 Other intervertebral disc degeneration, lumbar region: Secondary | ICD-10-CM | POA: Diagnosis not present

## 2018-07-10 DIAGNOSIS — M9903 Segmental and somatic dysfunction of lumbar region: Secondary | ICD-10-CM | POA: Diagnosis not present

## 2018-07-10 DIAGNOSIS — M5137 Other intervertebral disc degeneration, lumbosacral region: Secondary | ICD-10-CM | POA: Diagnosis not present

## 2018-07-10 DIAGNOSIS — M5441 Lumbago with sciatica, right side: Secondary | ICD-10-CM | POA: Diagnosis not present

## 2018-07-11 DIAGNOSIS — M5136 Other intervertebral disc degeneration, lumbar region: Secondary | ICD-10-CM | POA: Diagnosis not present

## 2018-07-11 DIAGNOSIS — M5441 Lumbago with sciatica, right side: Secondary | ICD-10-CM | POA: Diagnosis not present

## 2018-07-11 DIAGNOSIS — M5137 Other intervertebral disc degeneration, lumbosacral region: Secondary | ICD-10-CM | POA: Diagnosis not present

## 2018-07-11 DIAGNOSIS — M9903 Segmental and somatic dysfunction of lumbar region: Secondary | ICD-10-CM | POA: Diagnosis not present

## 2018-07-12 DIAGNOSIS — M5441 Lumbago with sciatica, right side: Secondary | ICD-10-CM | POA: Diagnosis not present

## 2018-07-12 DIAGNOSIS — M5137 Other intervertebral disc degeneration, lumbosacral region: Secondary | ICD-10-CM | POA: Diagnosis not present

## 2018-07-12 DIAGNOSIS — M9903 Segmental and somatic dysfunction of lumbar region: Secondary | ICD-10-CM | POA: Diagnosis not present

## 2018-07-12 DIAGNOSIS — M5136 Other intervertebral disc degeneration, lumbar region: Secondary | ICD-10-CM | POA: Diagnosis not present

## 2018-07-13 DIAGNOSIS — M5136 Other intervertebral disc degeneration, lumbar region: Secondary | ICD-10-CM | POA: Diagnosis not present

## 2018-07-13 DIAGNOSIS — M5137 Other intervertebral disc degeneration, lumbosacral region: Secondary | ICD-10-CM | POA: Diagnosis not present

## 2018-07-13 DIAGNOSIS — M9903 Segmental and somatic dysfunction of lumbar region: Secondary | ICD-10-CM | POA: Diagnosis not present

## 2018-07-13 DIAGNOSIS — M5441 Lumbago with sciatica, right side: Secondary | ICD-10-CM | POA: Diagnosis not present

## 2018-07-14 ENCOUNTER — Encounter: Payer: Self-pay | Admitting: Internal Medicine

## 2018-07-14 ENCOUNTER — Ambulatory Visit: Payer: Medicare HMO | Admitting: Internal Medicine

## 2018-07-14 VITALS — BP 130/74 | HR 77 | Ht 71.0 in | Wt 191.0 lb

## 2018-07-14 DIAGNOSIS — Z9229 Personal history of other drug therapy: Secondary | ICD-10-CM | POA: Diagnosis not present

## 2018-07-14 DIAGNOSIS — Z85820 Personal history of malignant melanoma of skin: Secondary | ICD-10-CM

## 2018-07-14 DIAGNOSIS — Z95810 Presence of automatic (implantable) cardiac defibrillator: Secondary | ICD-10-CM | POA: Diagnosis not present

## 2018-07-14 DIAGNOSIS — I2510 Atherosclerotic heart disease of native coronary artery without angina pectoris: Secondary | ICD-10-CM | POA: Diagnosis not present

## 2018-07-14 DIAGNOSIS — E0800 Diabetes mellitus due to underlying condition with hyperosmolarity without nonketotic hyperglycemic-hyperosmolar coma (NKHHC): Secondary | ICD-10-CM

## 2018-07-14 DIAGNOSIS — I255 Ischemic cardiomyopathy: Secondary | ICD-10-CM

## 2018-07-14 DIAGNOSIS — I472 Ventricular tachycardia, unspecified: Secondary | ICD-10-CM

## 2018-07-14 MED ORDER — SACUBITRIL-VALSARTAN 24-26 MG PO TABS: 1.0000 | ORAL_TABLET | Freq: Two times a day (BID) | ORAL | 11 refills | Status: DC

## 2018-07-14 NOTE — Progress Notes (Signed)
HPI Mr. Vane returns today after a long absence from my clinic. He is a 67 yo man with a h/o VT, PVC's, CHF, ICM, s/p MI with known chronically occluded LAD. He has undergone recent ICD gen change. He was on sotalol in the past for VT but this became ineffective and he has been switched to amiodarone. He has a remote h/o thyroid dysfunction on amio. The patient's VT has been controlled on a combination of mexitil and amio. He notes that he was diagnosed with melanoma in [place] several months ago and underwent excision. He had a PET scan with a couple of areas that were abnormal and worrisome for local mets. He has not received any chemotherapy.  No Known Allergies   Current Outpatient Medications  Medication Sig Dispense Refill  . acyclovir (ZOVIRAX) 800 MG tablet Take 800 mg by mouth daily.    Marland Kitchen amiodarone (PACERONE) 200 MG tablet Take 200 mg by mouth daily.      Marland Kitchen aspirin 81 MG tablet Take 81 mg by mouth daily.      . carvedilol (COREG) 6.25 MG tablet Take 6.25 mg by mouth 2 (two) times daily with a meal.    . glimepiride (AMARYL) 2 MG tablet Take 2 mg by mouth 3 (three) times daily.    Marland Kitchen ipratropium (ATROVENT) 0.06 % nasal spray Place 2 sprays into both nostrils 3 (three) times daily.    Marland Kitchen lisinopril (PRINIVIL,ZESTRIL) 10 MG tablet Take 10 mg by mouth daily.    Marland Kitchen mexiletine (MEXITIL) 150 MG capsule Take 150 mg by mouth 3 (three) times daily.    . simvastatin (ZOCOR) 40 MG tablet Take 40 mg by mouth daily.     No current facility-administered medications for this visit.      Past Medical History:  Diagnosis Date  . Anxiety   . CAD (coronary artery disease)   . CAD, NATIVE VESSEL 02/18/2009   Qualifier: Diagnosis of  By: Stevie Kern NP-C, Sharyn Lull    . Encounter for long-term (current) use of high-risk medication   . Heart failure, systolic, acute on chronic ([place])   . ICD (implantable cardiac defibrillator), single, in situ   . ICD - IN SITU 01/02/2009   Qualifier: Diagnosis  of  By: Peri Maris    . ISCHEMIC CARDIOMYOPATHY 03/19/2009   Qualifier: Diagnosis of  By: Boyce Medici RN, BSN, [place]    . LEFT VENTRICULAR MURAL THROMBUS 02/18/2009   Qualifier: Diagnosis of  By: Stevie Kern NP-C, Sharyn Lull    . Long term current use of aspirin   . MI (myocardial infarction) ([place]) 08/2003  . PAROXYSMAL VENTRICULAR TACHYCARDIA 03/19/2009   Qualifier: Diagnosis of  By: Boyce Medici RN, BSN, [place]    . SYSTOLIC HEART FAILURE, ACUTE ON CHRONIC 01/02/2009   Qualifier: Diagnosis of  By: Stevie Kern NP-C, Sharyn Lull    . TACHYCARDIA 01/02/2009   Qualifier: Diagnosis of  By: Peri Maris    . Ventricular tachycardia ([place])     ROS:   All systems reviewed and negative except as noted in the HPI.   No past surgical history on file.   Family History  Problem Relation Age of Onset  . Alcohol abuse Other   . Kidney Stones Father      Social History   Socioeconomic History  . Marital status: Single    Spouse name: Not on file  . Number of children: Not on file  . Years of education: Not on file  . Highest education level:  Not on file  Occupational History  . Occupation: Unemployed since 10/2008  Social Needs  . Financial resource strain: Not on file  . Food insecurity:    Worry: Not on file    Inability: Not on file  . Transportation needs:    Medical: Not on file    Non-medical: Not on file  Tobacco Use  . Smoking status: Former Research scientist (life sciences)  . Smokeless tobacco: Never Used  Substance and Sexual Activity  . Alcohol use: Yes  . Drug use: No  . Sexual activity: Not on file  Lifestyle  . Physical activity:    Days per week: Not on file    Minutes per session: Not on file  . Stress: Not on file  Relationships  . Social connections:    Talks on phone: Not on file    Gets together: Not on file    Attends religious service: Not on file    Active member of club or organization: Not on file    Attends meetings of clubs or organizations: Not on file    Relationship  status: Not on file  . Intimate partner violence:    Fear of current or ex partner: Not on file    Emotionally abused: Not on file    Physically abused: Not on file    Forced sexual activity: Not on file  Other Topics Concern  . Not on file  Social History Narrative   Single   Lives with sister   No regular exercise     BP 130/74   Pulse 77   Ht 5\' 11"  (1.803 m)   Wt 191 lb (86.6 kg)   BMI 26.64 kg/m   Physical Exam:  Well appearing 67 yo man, NAD HEENT: Unremarkable Neck:  No JVD, no thyromegally Lymphatics:  No adenopathy Back:  No CVA tenderness Lungs:  Clear HEART:  Regular rate rhythm, no murmurs, no rubs, no clicks Abd:  soft, positive bowel sounds, no organomegally, no rebound, no guarding Ext:  2 plus pulses, no edema, no cyanosis, no clubbing Skin:  No rashes no nodules Neuro:  CN II through XII intact, motor grossly intact  EKG - nsr with LBBB DEVICE  Normal device function.  See PaceArt for details.   Assess/Plan: 1. Chronic systolic heart failure - his symptoms have worsended. He is more tired. I have recommended he stop his ACE  And start Entresto in 2-3 days. 2. VT - he will continue amiodarone. He will check a liver panel and TSH/FT4.  3. Ischemic heart disease - he denies anginal symptoms. He will continue his current meds. 4. Melanoma - sounds like has has mets based on his PET scan. He will be referred to heme-onc.  Mikle Bosworth.D.

## 2018-07-14 NOTE — Patient Instructions (Addendum)
Medication Instructions:  Your physician has recommended you make the following change in your medication:  1.  Stop taking lisinopril 2.  After 2 days of NOT taking lisinopril you can then start ENTRESTO 3.  Take Entresto 24/26 one tablet by mouth twice a day.  Labwork: You will get lab work today:  TSH, T4, A1C and liver panel  Testing/Procedures: None ordered.  Follow-Up:  You will see our pharmacist in one month for Entresto new start titration.  Your physician wants you to follow-up in: 6 months with Dr. Lovena Le.   You will receive a reminder letter in the mail two months in advance. If you don't receive a letter, please call our office to schedule the follow-up appointment.  Remote monitoring is used to monitor your ICD from home. This monitoring reduces the number of office visits required to check your device to one time per year. It allows [place] to keep an eye on the functioning of your device to ensure it is working properly. You are scheduled for a device check from home on 10/16/2018. You may send your transmission at any time that day. If you have a wireless device, the transmission will be sent automatically. After your physician reviews your transmission, you will receive a postcard with your next transmission date.  Any Other Special Instructions Will Be Listed Below (If Applicable).  A referral has been placed to Dr. Jana Hakim - oncology.  If you need a refill on your cardiac medications before your next appointment, please call your pharmacy.   Sacubitril; Valsartan oral tablet What is this medicine? SACUBITRIL; VALSARTAN (sak UE bi tril; val SAR tan) is a combination of 2 drugs used to reduce the risk of death and hospitalizations in people with long-lasting heart failure. It is usually used with other medicines to treat heart failure. This medicine may be used for other purposes; ask your health care provider or pharmacist if you have questions. COMMON BRAND NAME(S):  Entresto What should I tell my health care provider before I take this medicine? They need to know if you have any of these conditions: -diabetes and take a medicine that contains aliskiren -kidney disease -liver disease -an unusual or allergic reaction to sacubitril; valsartan, drugs called angiotensin converting enzyme (ACE) inhibitors, angiotensin II receptor blockers (ARBs), other medicines, foods, dyes, or preservatives -pregnant or trying to get pregnant -breast-feeding How should I use this medicine? Take this medicine by mouth with a glass of water. Follow the directions on the prescription label. You can take it with or without food. If it upsets your stomach, take it with food. Take your medicine at regular intervals. Do not take it more often than directed. Do not stop taking except on your doctor's advice. Do not take this medicine for at least 36 hours before or after you take an ACE inhibitor medicine. Talk to your health care provider if you are not sure if you take an ACE inhibitor. Talk to your pediatrician regarding the use of this medicine in children. Special care may be needed. Overdosage: If you think you have taken too much of this medicine contact a poison control center or emergency room at once. NOTE: This medicine is only for you. Do not share this medicine with others. What if I miss a dose? If you miss a dose, take it as soon as you can. If it is almost time for next dose, take only that dose. Do not take double or extra doses. What may interact with this  medicine? Do not take this medicine with any of the following medicines: -aliskiren if you have diabetes -angiotensin-converting enzyme (ACE) inhibitors, like benazepril, captopril, enalapril, fosinopril, lisinopril, or ramipril This medicine may also interact with the following medicines: -angiotensin II receptor blockers (ARBs) like azilsartan, candesartan, eprosartan, irbesartan, losartan, olmesartan,  telmisartan, or valsartan -lithium -NSAIDS, medicines for pain and inflammation, like ibuprofen or naproxen -potassium-sparing diuretics like amiloride, spironolactone, and triamterene -potassium supplements This list may not describe all possible interactions. Give your health care provider a list of all the medicines, herbs, non-prescription drugs, or dietary supplements you use. Also tell them if you smoke, drink alcohol, or use illegal drugs. Some items may interact with your medicine. What should I watch for while using this medicine? Tell your doctor or healthcare professional if your symptoms do not start to get better or if they get worse. Do not become pregnant while taking this medicine. Women should inform their doctor if they wish to become pregnant or think they might be pregnant. There is a potential for serious side effects to an unborn child. Talk to your health care professional or pharmacist for more information. You may get dizzy. Do not drive, use machinery, or do anything that needs mental alertness until you know how this medicine affects you. Do not stand or sit up quickly, especially if you are an older patient. This reduces the risk of dizzy or fainting spells. Avoid alcoholic drinks; they can make you more dizzy. What side effects may I notice from receiving this medicine? Side effects that you should report to your doctor or health care professional as soon as possible: -allergic reactions like skin rash, itching or hives, swelling of the face, lips, or tongue -signs and symptoms of increased potassium like muscle weakness; chest pain; or fast, irregular heartbeat -signs and symptoms of kidney injury like trouble passing urine or change in the amount of urine -signs and symptoms of low blood pressure like feeling dizzy or lightheaded, or if you develop extreme fatigue Side effects that usually do not require medical attention (report to your doctor or health care  professional if they continue or are bothersome): -cough This list may not describe all possible side effects. Call your doctor for medical advice about side effects. You may report side effects to FDA at 1-800-FDA-1088. Where should I keep my medicine? Keep out of the reach of children. Store at room temperature between 15 and 30 degrees C (59 and 86 degrees F). Throw away any unused medicine after the expiration date. NOTE: This sheet is a summary. It may not cover all possible information. If you have questions about this medicine, talk to your doctor, pharmacist, or health care provider.  2018 Elsevier/Gold Standard (2015-12-31 13:54:19)

## 2018-07-15 LAB — CUP PACEART INCLINIC DEVICE CHECK
Date Time Interrogation Session: 20190816040000
HighPow Impedance: 53 Ohm
Implantable Pulse Generator Implant Date: 20190510
Lead Channel Impedance Value: 3000 Ohm
Lead Channel Impedance Value: 527 Ohm
Lead Channel Impedance Value: 587 Ohm
Lead Channel Pacing Threshold Amplitude: 3.4 V
Lead Channel Pacing Threshold Pulse Width: 0.4 ms
Lead Channel Sensing Intrinsic Amplitude: 11.9 mV
Lead Channel Sensing Intrinsic Amplitude: 4.7 mV
Lead Channel Setting Pacing Amplitude: 0.1 V
Lead Channel Setting Pacing Amplitude: 2 V
Lead Channel Setting Pacing Amplitude: 2 V
Lead Channel Setting Pacing Pulse Width: 0.1 ms
Lead Channel Setting Pacing Pulse Width: 0.4 ms
Lead Channel Setting Sensing Sensitivity: 0.6 mV
Lead Channel Setting Sensing Sensitivity: 1 mV
Pulse Gen Serial Number: 492955

## 2018-07-15 LAB — HEPATIC FUNCTION PANEL
ALT: 19 IU/L (ref 0–44)
AST: 13 IU/L (ref 0–40)
Albumin: 4.6 g/dL (ref 3.6–4.8)
Alkaline Phosphatase: 67 IU/L (ref 39–117)
Bilirubin Total: 0.3 mg/dL (ref 0.0–1.2)
Bilirubin, Direct: 0.12 mg/dL (ref 0.00–0.40)
Total Protein: 7.3 g/dL (ref 6.0–8.5)

## 2018-07-15 LAB — T4, FREE: FREE T4: 1.38 ng/dL (ref 0.82–1.77)

## 2018-07-15 LAB — TSH: TSH: 4.01 u[IU]/mL (ref 0.450–4.500)

## 2018-07-15 LAB — HEMOGLOBIN A1C
ESTIMATED AVERAGE GLUCOSE: 174 mg/dL
Hgb A1c MFr Bld: 7.7 % — ABNORMAL HIGH (ref 4.8–5.6)

## 2018-07-17 DIAGNOSIS — M5441 Lumbago with sciatica, right side: Secondary | ICD-10-CM | POA: Diagnosis not present

## 2018-07-17 DIAGNOSIS — M5136 Other intervertebral disc degeneration, lumbar region: Secondary | ICD-10-CM | POA: Diagnosis not present

## 2018-07-17 DIAGNOSIS — M9903 Segmental and somatic dysfunction of lumbar region: Secondary | ICD-10-CM | POA: Diagnosis not present

## 2018-07-17 DIAGNOSIS — M5137 Other intervertebral disc degeneration, lumbosacral region: Secondary | ICD-10-CM | POA: Diagnosis not present

## 2018-07-18 DIAGNOSIS — M5137 Other intervertebral disc degeneration, lumbosacral region: Secondary | ICD-10-CM | POA: Diagnosis not present

## 2018-07-18 DIAGNOSIS — M5136 Other intervertebral disc degeneration, lumbar region: Secondary | ICD-10-CM | POA: Diagnosis not present

## 2018-07-18 DIAGNOSIS — M5441 Lumbago with sciatica, right side: Secondary | ICD-10-CM | POA: Diagnosis not present

## 2018-07-18 DIAGNOSIS — M9903 Segmental and somatic dysfunction of lumbar region: Secondary | ICD-10-CM | POA: Diagnosis not present

## 2018-07-20 DIAGNOSIS — M9903 Segmental and somatic dysfunction of lumbar region: Secondary | ICD-10-CM | POA: Diagnosis not present

## 2018-07-20 DIAGNOSIS — M5136 Other intervertebral disc degeneration, lumbar region: Secondary | ICD-10-CM | POA: Diagnosis not present

## 2018-07-20 DIAGNOSIS — M5137 Other intervertebral disc degeneration, lumbosacral region: Secondary | ICD-10-CM | POA: Diagnosis not present

## 2018-07-20 DIAGNOSIS — M5441 Lumbago with sciatica, right side: Secondary | ICD-10-CM | POA: Diagnosis not present

## 2018-07-25 DIAGNOSIS — M5441 Lumbago with sciatica, right side: Secondary | ICD-10-CM | POA: Diagnosis not present

## 2018-07-25 DIAGNOSIS — M5136 Other intervertebral disc degeneration, lumbar region: Secondary | ICD-10-CM | POA: Diagnosis not present

## 2018-07-25 DIAGNOSIS — M5137 Other intervertebral disc degeneration, lumbosacral region: Secondary | ICD-10-CM | POA: Diagnosis not present

## 2018-07-25 DIAGNOSIS — M9903 Segmental and somatic dysfunction of lumbar region: Secondary | ICD-10-CM | POA: Diagnosis not present

## 2018-07-26 ENCOUNTER — Telehealth: Payer: Self-pay | Admitting: Oncology

## 2018-07-26 ENCOUNTER — Encounter: Payer: Self-pay | Admitting: Oncology

## 2018-07-26 NOTE — Telephone Encounter (Signed)
A new patient appt has been scheduled for the [t to see Dr. Alen Blew on 9/12 at 11am. Pt aware to arrive 30 minutes early. Letter mailed.

## 2018-07-27 DIAGNOSIS — M5136 Other intervertebral disc degeneration, lumbar region: Secondary | ICD-10-CM | POA: Diagnosis not present

## 2018-07-27 DIAGNOSIS — M5137 Other intervertebral disc degeneration, lumbosacral region: Secondary | ICD-10-CM | POA: Diagnosis not present

## 2018-07-27 DIAGNOSIS — M5441 Lumbago with sciatica, right side: Secondary | ICD-10-CM | POA: Diagnosis not present

## 2018-07-27 DIAGNOSIS — M9903 Segmental and somatic dysfunction of lumbar region: Secondary | ICD-10-CM | POA: Diagnosis not present

## 2018-08-01 ENCOUNTER — Telehealth: Payer: Self-pay

## 2018-08-01 DIAGNOSIS — M5136 Other intervertebral disc degeneration, lumbar region: Secondary | ICD-10-CM | POA: Diagnosis not present

## 2018-08-01 DIAGNOSIS — M5137 Other intervertebral disc degeneration, lumbosacral region: Secondary | ICD-10-CM | POA: Diagnosis not present

## 2018-08-01 DIAGNOSIS — M9903 Segmental and somatic dysfunction of lumbar region: Secondary | ICD-10-CM | POA: Diagnosis not present

## 2018-08-01 DIAGNOSIS — M5441 Lumbago with sciatica, right side: Secondary | ICD-10-CM | POA: Diagnosis not present

## 2018-08-01 NOTE — Telephone Encounter (Signed)
Spoke with Pt. Oncology office has already contacted Pt for records they need and scheduled him with f/u.  No further action needed.

## 2018-08-02 DIAGNOSIS — M5136 Other intervertebral disc degeneration, lumbar region: Secondary | ICD-10-CM | POA: Diagnosis not present

## 2018-08-02 DIAGNOSIS — M9903 Segmental and somatic dysfunction of lumbar region: Secondary | ICD-10-CM | POA: Diagnosis not present

## 2018-08-02 DIAGNOSIS — M5137 Other intervertebral disc degeneration, lumbosacral region: Secondary | ICD-10-CM | POA: Diagnosis not present

## 2018-08-02 DIAGNOSIS — M5441 Lumbago with sciatica, right side: Secondary | ICD-10-CM | POA: Diagnosis not present

## 2018-08-07 ENCOUNTER — Encounter: Payer: Self-pay | Admitting: Family Medicine

## 2018-08-07 ENCOUNTER — Ambulatory Visit (INDEPENDENT_AMBULATORY_CARE_PROVIDER_SITE_OTHER): Payer: Medicare HMO | Admitting: Family Medicine

## 2018-08-07 VITALS — BP 120/78 | HR 70 | Temp 97.9°F | Ht 71.0 in | Wt 188.8 lb

## 2018-08-07 DIAGNOSIS — G4709 Other insomnia: Secondary | ICD-10-CM | POA: Diagnosis not present

## 2018-08-07 DIAGNOSIS — R5383 Other fatigue: Secondary | ICD-10-CM | POA: Diagnosis not present

## 2018-08-07 DIAGNOSIS — C439 Malignant melanoma of skin, unspecified: Secondary | ICD-10-CM

## 2018-08-07 DIAGNOSIS — E1169 Type 2 diabetes mellitus with other specified complication: Secondary | ICD-10-CM | POA: Diagnosis not present

## 2018-08-07 DIAGNOSIS — Z95810 Presence of automatic (implantable) cardiac defibrillator: Secondary | ICD-10-CM

## 2018-08-07 DIAGNOSIS — Z23 Encounter for immunization: Secondary | ICD-10-CM

## 2018-08-07 DIAGNOSIS — R251 Tremor, unspecified: Secondary | ICD-10-CM | POA: Diagnosis not present

## 2018-08-07 DIAGNOSIS — E119 Type 2 diabetes mellitus without complications: Secondary | ICD-10-CM

## 2018-08-07 DIAGNOSIS — E785 Hyperlipidemia, unspecified: Secondary | ICD-10-CM | POA: Diagnosis not present

## 2018-08-07 DIAGNOSIS — R948 Abnormal results of function studies of other organs and systems: Secondary | ICD-10-CM | POA: Diagnosis not present

## 2018-08-07 DIAGNOSIS — I2510 Atherosclerotic heart disease of native coronary artery without angina pectoris: Secondary | ICD-10-CM

## 2018-08-07 MED ORDER — ZOSTER VAC RECOMB ADJUVANTED 50 MCG/0.5ML IM SUSR: 0.5000 mL | Freq: Once | INTRAMUSCULAR | 0 refills | Status: AC

## 2018-08-07 MED ORDER — METFORMIN HCL 500 MG PO TABS: 500.0000 mg | ORAL_TABLET | Freq: Two times a day (BID) | ORAL | 2 refills | Status: DC

## 2018-08-07 NOTE — Patient Instructions (Signed)
Complete bloodwork after seeing oncology.   I will call with results and we can discuss management of diabetes at that time.

## 2018-08-07 NOTE — Progress Notes (Signed)
Lee Hall DOB: 16-Dec-1950 Encounter date: 08/07/2018  This is a 67 y.o. male who presents to establish care.  Chief Complaint  Patient presents with  . Establish Care    discuss vaccines, has appointment with oncology 08/10/18    History of present illness:  Has been going to [place] for years for health care. Now relocating here after having ongoing medical issues to be closer to family (lives with sister and her husband)  First pacemaker in 2005 after heart attack. Now seeing Dr. Lovena Le for cardio. Replaced 2012. Fired 24 times in Feb this year. Hospitalized 18 days. Had ablation completed. Replaced pacemaker in April. Back on amiodorone for control of vent tach, which is what he took at beginning. Had trouble controlling HR on anything else. Feels ok except a couple of things.   Gets a "cramp" below left breast 1-5 times daily that lasts for a second. After it happens has dizziness, feel like he is in "[place]". Per cardio they don't think it is cardiac event. Nothing has picked up on this. Some days where he feels much weaker; just not as strong. Other days feels very strong/great.   Other med changes - taken off lisinopril and put on entresto. Just last week changed to crestor due to interaction of simvastatin with amiodarone. Fatigue has been going on since he left hospital with pacemaker replacement. Having nightmares where he dreams about pacemaker firing and has since February. So concerned that he is still having these nightmares.   Second concern is fighting high blood sugar. Has been taking glimepiride. Taking 8mg  daily. Sugar running 140-160. Wanting to get treatment under control without seeing specialist. Had tried metformin for a couple of years but states it "never did anything". Doesn't do sweets. Tries to be close. Eats a lot of grilled food. Limits alcohol content.   Wants to know what shots to get for his age.   Seeing oncologist Thursday at [facility] (Dr.  Zola Button) for melanoma. Had PET scan which was abnormal (will scan in chart):several areas of uptake. He was not previously insured and couldn't afford to follow up with them and get full excision.   In last year can't write. Bumped elbow one day and since then has hard time writing - tremor with writing. Just right hand. Not certain it came after hitting elbow.   Urine is sometimes strong in odor (first urination of day); better throughout day. Urinates a lot throughout day.    Past Medical History:  Diagnosis Date  . Anxiety   . CAD (coronary artery disease)   . CAD, NATIVE VESSEL 02/18/2009   Qualifier: Diagnosis of  By: Stevie Kern NP-C, Sharyn Lull    . Encounter for long-term (current) use of high-risk medication   . Heart failure, systolic, acute on chronic ([place])   . ICD (implantable cardiac defibrillator), single, in situ   . ICD - IN SITU 01/02/2009   Qualifier: Diagnosis of  By: Peri Maris    . ISCHEMIC CARDIOMYOPATHY 03/19/2009   Qualifier: Diagnosis of  By: Boyce Medici RN, BSN, [place]    . LEFT VENTRICULAR MURAL THROMBUS 02/18/2009   Qualifier: Diagnosis of  By: Stevie Kern NP-C, Sharyn Lull    . Long term current use of aspirin   . Melanoma ([place]) 2019  . MI (myocardial infarction) ([place]) 08/2003  . PAROXYSMAL VENTRICULAR TACHYCARDIA 03/19/2009   Qualifier: Diagnosis of  By: Boyce Medici RN, BSN, [place]    . SYSTOLIC HEART FAILURE, ACUTE ON CHRONIC 01/02/2009  Qualifier: Diagnosis of  By: Peri Maris    . TACHYCARDIA 01/02/2009   Qualifier: Diagnosis of  By: Peri Maris    . Ventricular tachycardia [facility])    Past Surgical History:  Procedure Laterality Date  . SKIN BIOPSY  2019   melanoma R shoulder  . TONSILLECTOMY AND ADENOIDECTOMY  1969   No Known Allergies Current Meds  Medication Sig  . acyclovir (ZOVIRAX) 800 MG tablet Take 800 mg by mouth daily.  Marland Kitchen amiodarone (PACERONE) 200 MG tablet Take 200 mg by mouth daily.    Marland Kitchen aspirin 81 MG tablet Take 81 mg by  mouth daily.    . carvedilol (COREG) 6.25 MG tablet Take 6.25 mg by mouth 2 (two) times daily with a meal.  . glimepiride (AMARYL) 2 MG tablet Take 2 mg by mouth 3 (three) times daily.  Marland Kitchen ipratropium (ATROVENT) 0.06 % nasal spray Place 2 sprays into both nostrils 3 (three) times daily.  Marland Kitchen mexiletine (MEXITIL) 150 MG capsule Take 150 mg by mouth 3 (three) times daily.  . rosuvastatin (CRESTOR) 10 MG tablet   . sacubitril-valsartan (ENTRESTO) 24-26 MG Take 1 tablet by mouth 2 (two) times daily.   Social History   Tobacco Use  . Smoking status: Former Research scientist (life sciences)  . Smokeless tobacco: Never Used  Substance Use Topics  . Alcohol use: Yes   Family History  Problem Relation Age of Onset  . Alcohol abuse Other   . Kidney Stones Father      Review of Systems  Constitutional: Positive for fatigue (intermittent). Negative for chills and fever.  Respiratory: Negative for cough, chest tightness, shortness of breath and wheezing.   Cardiovascular: Positive for chest pain (brief quick left sided occasional sharp pain; noncardiac per cardio). Negative for palpitations and leg swelling.  Gastrointestinal: Negative for abdominal pain.  Genitourinary: Positive for frequency.  Neurological: Positive for tremors. Negative for weakness.  Psychiatric/Behavioral: Positive for sleep disturbance.    Objective:  BP 120/78 (BP Location: Right Arm, Patient Position: Sitting, Cuff Size: Normal)   Pulse 70   Temp 97.9 F (36.6 C) (Oral)   Ht 5\' 11"  (1.803 m)   Wt 188 lb 12.8 oz (85.6 kg)   SpO2 98%   BMI 26.33 kg/m   Weight: 188 lb 12.8 oz (85.6 kg)   BP Readings from Last 3 Encounters:  08/07/18 120/78  07/14/18 130/74  02/28/18 138/78   Wt Readings from Last 3 Encounters:  08/07/18 188 lb 12.8 oz (85.6 kg)  07/14/18 191 lb (86.6 kg)  02/28/18 189 lb 3.2 oz (85.8 kg)    Physical Exam  Constitutional: He appears well-developed and well-nourished. No distress.  Cardiovascular: Normal rate,  regular rhythm and normal heart sounds. Exam reveals no friction rub.  No murmur heard. No lower extremity edema  Pulmonary/Chest: Effort normal and breath sounds normal. No respiratory distress. He has no wheezes. He has no rales.  Lymphadenopathy:    He has cervical adenopathy (bilat submandib).  Neurological: He displays tremor. He displays a negative Romberg sign. Coordination normal.  Reflex Scores:      Tricep reflexes are 2+ on the right side and 2+ on the left side.      Bicep reflexes are 2+ on the right side and 2+ on the left side.      Brachioradialis reflexes are 2+ on the right side and 2+ on the left side. Intention tremor towards end of motion with finger nose testing noted on right.  Psychiatric: He  has a normal mood and affect.    Assessment/Plan: 1. Fatigue, unspecified type I have ordered some bloodwork today. Oncology will likely request other bloodwork so he will wait until that appointment to complete.  - CBC with Differential/Platelet; Future  2. Type 2 diabetes mellitus without complication, without long-term current use of insulin (HCC) Suboptimal control. Prefers oral medications for management if possible. Would like him to try metformin along with the glimepiride and sugar monitoring. If not improving we will have low threshold to change therapy. He is happy with glimepiride but we discussed risk of hypoglycemia so we will continue to assess/discuss this medication. - metFORMIN (GLUCOPHAGE) 500 MG tablet; Take 1 tablet (500 mg total) by mouth 2 (two) times daily with a meal.  Dispense: 60 tablet; Refill: 2 - Microalbumin / creatinine urine ratio; Future  3. Hyperlipidemia associated with type 2 diabetes mellitus ([place]) Cont  crestor. - Lipid panel; Future  4. Need for shingles vaccine - Zoster Vaccine Adjuvanted [facility]) injection; Inject 0.5 mLs into the muscle once for 1 dose. Repeat in 2-6 months  Dispense: 0.5 mL; Refill: 0  5. Tremor of right  hand Action tremor/possibly more writing tremor. At this point although a nuisance for him, I do not feel that there is benefit to further work up. Priority is evaluation for melanoma and positive PET scanning and we can revisit if needed.  6. Atherosclerosis of native coronary artery of native heart without angina pectoris Following with cardiology. I have reviewed most recent bloodwork available.   7. Implantable cardioverter-defibrillator (ICD) in situ Following with cardiology.  8. Other insomnia Not sleeping well due to stressors from defibrillator going off so regularly back in February. Symptoms are gradually improving. Will continue to discuss if something more is needed to help with sleep.   9. Melanoma Seeing oncology on Thursday.  10. Abnormal PET scan (scanned to chart and report reviewed in office demonstrating 2.2cm subcut right shoulder (biopsy site) hypermetablism and single focal area right lower axilla 2cm consistent with R axillary malignant adenopathy) Seeing oncology on Thursday.   Return for pending bloodwork.  Micheline Rough, MD   Had pneumonia shot at pharmacy but not sure which one; he is going to call to see if he can find out.

## 2018-08-08 ENCOUNTER — Telehealth: Payer: Self-pay | Admitting: Family Medicine

## 2018-08-08 NOTE — Telephone Encounter (Signed)
Chart updated as requested.  Dr. Trevor Iha. Looks like he will need Prevnar-13 at an upcoming visit.

## 2018-08-08 NOTE — Telephone Encounter (Signed)
Copied from [place] [id number]. Topic: Quick Communication - See Telephone Encounter >> Aug 08, 2018 10:24 AM Sheran Luz wrote: CRM for notification. See Telephone encounter for: 08/08/18.  Pt called stating that he is having trouble finding Dr. Ethlyn Gallery on [address]. Pt wanted to leave his previous immunizations stating they were requested by Dr. Ethlyn Gallery. Pt has has flu shot, shingles(zotavax) pneumonia(pneumovax 23) on 09/23/13.

## 2018-08-09 NOTE — Telephone Encounter (Signed)
MyChart message has been sent informing patient that we have recived his message and asking him to reach out if he continues to have issues with MyChart.

## 2018-08-09 NOTE — Telephone Encounter (Signed)
Can you send message back in mychart to him? Thanks for update and we will catch him up when he is here for next visit.

## 2018-08-10 ENCOUNTER — Encounter: Payer: Self-pay | Admitting: Family Medicine

## 2018-08-10 ENCOUNTER — Inpatient Hospital Stay: Payer: Medicare HMO | Attending: Oncology | Admitting: Oncology

## 2018-08-10 VITALS — BP 116/75 | HR 64 | Temp 97.7°F | Resp 17 | Ht 71.0 in | Wt 189.6 lb

## 2018-08-10 DIAGNOSIS — I429 Cardiomyopathy, unspecified: Secondary | ICD-10-CM | POA: Diagnosis not present

## 2018-08-10 DIAGNOSIS — Z87891 Personal history of nicotine dependence: Secondary | ICD-10-CM | POA: Diagnosis not present

## 2018-08-10 DIAGNOSIS — Z7982 Long term (current) use of aspirin: Secondary | ICD-10-CM

## 2018-08-10 DIAGNOSIS — F419 Anxiety disorder, unspecified: Secondary | ICD-10-CM | POA: Diagnosis not present

## 2018-08-10 DIAGNOSIS — Z79899 Other long term (current) drug therapy: Secondary | ICD-10-CM | POA: Diagnosis not present

## 2018-08-10 DIAGNOSIS — I2510 Atherosclerotic heart disease of native coronary artery without angina pectoris: Secondary | ICD-10-CM | POA: Diagnosis not present

## 2018-08-10 DIAGNOSIS — I498 Other specified cardiac arrhythmias: Secondary | ICD-10-CM | POA: Diagnosis not present

## 2018-08-10 DIAGNOSIS — C439 Malignant melanoma of skin, unspecified: Secondary | ICD-10-CM

## 2018-08-10 DIAGNOSIS — C4361 Malignant melanoma of right upper limb, including shoulder: Secondary | ICD-10-CM | POA: Insufficient documentation

## 2018-08-10 DIAGNOSIS — R000 Tachycardia, unspecified: Secondary | ICD-10-CM | POA: Insufficient documentation

## 2018-08-10 DIAGNOSIS — C773 Secondary and unspecified malignant neoplasm of axilla and upper limb lymph nodes: Secondary | ICD-10-CM | POA: Diagnosis not present

## 2018-08-10 DIAGNOSIS — I252 Old myocardial infarction: Secondary | ICD-10-CM | POA: Diagnosis not present

## 2018-08-10 DIAGNOSIS — Z7984 Long term (current) use of oral hypoglycemic drugs: Secondary | ICD-10-CM | POA: Insufficient documentation

## 2018-08-10 NOTE — Progress Notes (Signed)
Reason for the request: Melanoma  HPI: I was asked by Dr. Ethlyn Gallery to evaluate Lee Hall for diagnosis of melanoma.  He is a 66 year old man with history of coronary disease as well as cardiac arrhythmia.  He was living in [place] and noted a lesion on his right shoulder in December 2018.  He was evaluated by dermatology at the time and underwent surgical resection completed on December 09, 2017.  He appears to have wide excision without any lymph node sampling.  The pathology of his right shoulder excision showed malignant melanoma that is ulcerated with depth of invasion of 4.0 mm.  The pathological staging was T3b although adequate margins was not obtained.  He was referred for a wider excision to obtain adequate margins but his treatment was placed on hold because of his cardiac arrhythmia and required a pacemaker placement.  He subsequently underwent a PET CT scan in July 2019.  Which showed a 2.2 cm subcutaneous soft tissue nodule in the right shoulder posteriorly with intense hypermetabolic activity consistent with a diagnosis of melanoma.  This could be related to residual disease or recurrence.  He also had a 2 cm right lower axillary lymph node with an SUV uptake of 6.2 consistent with a right axillary involvement.  He relocated to [facility] where he is originally from and establish care with primary care physician as well as cardiologist and was referred to me for this issue.  Clinically, he is asymptomatic at this time.  He is completely functional and performs activities of daily living.  He denies any excessive fatigue or tiredness.  He denies any palpable masses or lesions.   He does not report any headaches, blurry vision, syncope or seizures. Does not report any fevers, chills or sweats.  Does not report any cough, wheezing or hemoptysis.  Does not report any chest pain, palpitation, orthopnea or leg edema.  Does not report any nausea, vomiting or abdominal pain.  Does not report any  constipation or diarrhea.  Does not report any skeletal complaints.    Does not report frequency, urgency or hematuria.  Does not report any skin rashes or lesions. Does not report any heat or cold intolerance.  Does not report any lymphadenopathy or petechiae.  Does not report any anxiety or depression.  Remaining review of systems is negative.    Past Medical History:  Diagnosis Date  . Anxiety   . CAD (coronary artery disease)   . CAD, NATIVE VESSEL 02/18/2009   Qualifier: Diagnosis of  By: Stevie Kern NP-C, Sharyn Lull    . Encounter for long-term (current) use of high-risk medication   . Heart failure, systolic, acute on chronic ([place])   . ICD (implantable cardiac defibrillator), single, in situ   . ICD - IN SITU 01/02/2009   Qualifier: Diagnosis of  By: Peri Maris    . ISCHEMIC CARDIOMYOPATHY 03/19/2009   Qualifier: Diagnosis of  By: Boyce Medici RN, BSN, [place]    . LEFT VENTRICULAR MURAL THROMBUS 02/18/2009   Qualifier: Diagnosis of  By: Stevie Kern NP-C, Sharyn Lull    . Long term current use of aspirin   . Melanoma ([place]) 2019  . MI (myocardial infarction) ([place]) 08/2003  . PAROXYSMAL VENTRICULAR TACHYCARDIA 03/19/2009   Qualifier: Diagnosis of  By: Boyce Medici RN, BSN, [place]    . SYSTOLIC HEART FAILURE, ACUTE ON CHRONIC 01/02/2009   Qualifier: Diagnosis of  By: Stevie Kern NP-C, Sharyn Lull    . TACHYCARDIA 01/02/2009   Qualifier: Diagnosis of  By: Peri Maris    .  Ventricular tachycardia ([place])   :  Past Surgical History:  Procedure Laterality Date  . SKIN BIOPSY  2019   melanoma R shoulder  . TONSILLECTOMY AND ADENOIDECTOMY  1969  :   Current Outpatient Medications:  .  acyclovir (ZOVIRAX) 800 MG tablet, Take 800 mg by mouth daily., Disp: , Rfl:  .  amiodarone (PACERONE) 200 MG tablet, Take 200 mg by mouth daily.  , Disp: , Rfl:  .  aspirin 81 MG tablet, Take 81 mg by mouth daily.  , Disp: , Rfl:  .  carvedilol (COREG) 6.25 MG tablet, Take 6.25 mg by mouth 2 (two) times daily with a  meal., Disp: , Rfl:  .  glimepiride (AMARYL) 2 MG tablet, Take 2 mg by mouth 3 (three) times daily., Disp: , Rfl:  .  ipratropium (ATROVENT) 0.06 % nasal spray, Place 2 sprays into both nostrils 3 (three) times daily., Disp: , Rfl:  .  metFORMIN (GLUCOPHAGE) 500 MG tablet, Take 1 tablet (500 mg total) by mouth 2 (two) times daily with a meal., Disp: 60 tablet, Rfl: 2 .  mexiletine (MEXITIL) 150 MG capsule, Take 150 mg by mouth 3 (three) times daily., Disp: , Rfl:  .  rosuvastatin (CRESTOR) 10 MG tablet, , Disp: , Rfl:  .  sacubitril-valsartan (ENTRESTO) 24-26 MG, Take 1 tablet by mouth 2 (two) times daily., Disp: 60 tablet, Rfl: 11:  No Known Allergies:  Family History  Problem Relation Age of Onset  . Alzheimer's disease Mother   . Kidney Stones Father   . Diabetes Father   . Alcohol abuse Father   . Alzheimer's disease Maternal Grandmother   :  Social History   Socioeconomic History  . Marital status: Single    Spouse name: Not on file  . Number of children: Not on file  . Years of education: Not on file  . Highest education level: Not on file  Occupational History  . Occupation: Unemployed since 10/2008  Social Needs  . Financial resource strain: Not on file  . Food insecurity:    Worry: Not on file    Inability: Not on file  . Transportation needs:    Medical: Not on file    Non-medical: Not on file  Tobacco Use  . Smoking status: Former Research scientist (life sciences)  . Smokeless tobacco: Never Used  Substance and Sexual Activity  . Alcohol use: Yes  . Drug use: No  . Sexual activity: Not Currently  Lifestyle  . Physical activity:    Days per week: Not on file    Minutes per session: Not on file  . Stress: Not on file  Relationships  . Social connections:    Talks on phone: Not on file    Gets together: Not on file    Attends religious service: Not on file    Active member of club or organization: Not on file    Attends meetings of clubs or organizations: Not on file     Relationship status: Not on file  . Intimate partner violence:    Fear of current or ex partner: Not on file    Emotionally abused: Not on file    Physically abused: Not on file    Forced sexual activity: Not on file  Other Topics Concern  . Not on file  Social History Narrative   Single   Lives with sister   No regular exercise  :  Pertinent items are noted in HPI.  Exam: Blood pressure 116/75, pulse 64, temperature  97.7 F (36.5 C), temperature source Oral, resp. rate 17, height 5\' 11"  (1.803 m), weight 189 lb 9.6 oz (86 kg), SpO2 100 %.  ECOG 0 General appearance: alert and cooperative appeared without distress. Head: atraumatic without any abnormalities. Eyes: conjunctivae/corneas clear. PERRL.  Sclera anicteric. Throat: lips, mucosa, and tongue normal; without oral thrush or ulcers. Resp: clear to auscultation bilaterally without rhonchi, wheezes or dullness to percussion. Cardio: regular rate and rhythm, S1, S2 normal, no murmur, click, rub or gallop GI: soft, non-tender; bowel sounds normal; no masses,  no organomegaly Skin: Well-healed scar noted on his right shoulder.  No other rashes or lesions noted. Lymph nodes: I cannot palpate any right cervical adenopathy at this time.  No adenopathy noted other areas. Neurologic: Grossly normal without any motor, sensory or deep tendon reflexes. Musculoskeletal: No joint deformity or effusion.  CBC    Component Value Date/Time   WBC 7.2 11/28/2008 0400   RBC 4.79 11/28/2008 0400   HGB 14.8 11/28/2008 0400   HCT 43.6 11/28/2008 0400   PLT 154 11/28/2008 0400   MCV 91.0 11/28/2008 0400   MCHC 34.0 11/28/2008 0400   RDW 13.3 11/28/2008 0400   LYMPHSABS 2.2 11/26/2008 1855   MONOABS 0.7 11/26/2008 1855   EOSABS 0.2 11/26/2008 1855   BASOSABS 0.0 11/26/2008 1855     Chemistry      Component Value Date/Time   NA 140 11/28/2008 0400   K 4.3 11/28/2008 0400   CL 104 11/28/2008 0400   CO2 28 11/28/2008 0400   BUN 12  11/28/2008 0400   CREATININE 1.18 11/28/2008 0400      Component Value Date/Time   CALCIUM 9.4 11/28/2008 0400   ALKPHOS 67 07/14/2018 1626   AST 13 07/14/2018 1626   ALT 19 07/14/2018 1626   BILITOT 0.3 07/14/2018 1626        Assessment and Plan:   67 year old man with the following:  1.  Melanoma of the right shoulder diagnosed in January 2019.  He presented with a pigmented lesion and underwent surgical excision.  He was found to have a 4 mm lesion with pathological stage of T3b ulcerated melanoma.  No adequate margins were taken at that time.  No lymph node sampling was also performed.  A PET CT scan in July 2019 showed FDG uptake around the surgical site which could indicate recurrent disease as well as right axillary lymph node.  The natural course of this disease was reviewed today and treatment options were reviewed.  He appears to have less than adequate surgical resection without any sentinel lymph node sampling for a deep and high risk ulcerated melanoma.  His disease could have progressed further at this time as evidenced by his PET CT scan obtained in July 2019.  The first step is to repeat his staging work-up and repeat imaging studies including a PET CT scan.  If he continues to have localized disease around the incision site as well as his axillary lymph node basin, he will require reexcision and complete lymph node dissection at that time.  If he has systemic disease beyond these regions, systemic therapy alone may be needed.  He has localized disease, I recommend proceeding with localized therapy with surgical reexcision and lymph node dissection followed by adjuvant immunotherapy.  I will arrange for a PET scan as well as referral to [facility] surgery for an evaluation.  2.  Skin protection strategies: I recommend establishing care with dermatology for routine examination in the future.  We also discussed strategies to prevent skin damage including long sleeves as  well as hats in addition to appropriate sunblock.  3.  Follow-up: We will be determined by his PET scan as well as potential candidacy for surgery.  60  minutes was spent with the patient face-to-face today.  More than 50% of time was dedicated to reviewing the natural course of this disease, reviewing imaging studies and coordinating his plan of care.     Thank you for the referral..  A copy of this consult has been forwarded to the requesting physician.

## 2018-08-10 NOTE — Addendum Note (Signed)
Addended by: Randolm Idol on: 08/10/2018 12:49 PM   Modules accepted: Orders

## 2018-08-11 ENCOUNTER — Other Ambulatory Visit (INDEPENDENT_AMBULATORY_CARE_PROVIDER_SITE_OTHER): Payer: Medicare HMO

## 2018-08-11 ENCOUNTER — Ambulatory Visit (INDEPENDENT_AMBULATORY_CARE_PROVIDER_SITE_OTHER): Payer: Medicare HMO

## 2018-08-11 ENCOUNTER — Telehealth: Payer: Self-pay | Admitting: Family Medicine

## 2018-08-11 DIAGNOSIS — E1169 Type 2 diabetes mellitus with other specified complication: Secondary | ICD-10-CM | POA: Diagnosis not present

## 2018-08-11 DIAGNOSIS — E785 Hyperlipidemia, unspecified: Secondary | ICD-10-CM | POA: Diagnosis not present

## 2018-08-11 DIAGNOSIS — E119 Type 2 diabetes mellitus without complications: Secondary | ICD-10-CM

## 2018-08-11 DIAGNOSIS — Z23 Encounter for immunization: Secondary | ICD-10-CM

## 2018-08-11 DIAGNOSIS — R5383 Other fatigue: Secondary | ICD-10-CM | POA: Diagnosis not present

## 2018-08-11 LAB — CBC WITH DIFFERENTIAL/PLATELET
BASOS ABS: 0 10*3/uL (ref 0.0–0.1)
Basophils Relative: 0.4 % (ref 0.0–3.0)
EOS PCT: 2.5 % (ref 0.0–5.0)
Eosinophils Absolute: 0.2 10*3/uL (ref 0.0–0.7)
HCT: 46.4 % (ref 39.0–52.0)
Hemoglobin: 15.6 g/dL (ref 13.0–17.0)
LYMPHS ABS: 1.2 10*3/uL (ref 0.7–4.0)
Lymphocytes Relative: 19 % (ref 12.0–46.0)
MCHC: 33.7 g/dL (ref 30.0–36.0)
MCV: 90.8 fl (ref 78.0–100.0)
MONO ABS: 0.5 10*3/uL (ref 0.1–1.0)
Monocytes Relative: 8.2 % (ref 3.0–12.0)
NEUTROS PCT: 69.9 % (ref 43.0–77.0)
Neutro Abs: 4.4 10*3/uL (ref 1.4–7.7)
Platelets: 149 10*3/uL — ABNORMAL LOW (ref 150.0–400.0)
RBC: 5.11 Mil/uL (ref 4.22–5.81)
RDW: 14 % (ref 11.5–15.5)
WBC: 6.4 10*3/uL (ref 4.0–10.5)

## 2018-08-11 LAB — MICROALBUMIN / CREATININE URINE RATIO
Creatinine,U: 144.8 mg/dL
Microalb Creat Ratio: 2.8 mg/g (ref 0.0–30.0)
Microalb, Ur: 4.1 mg/dL — ABNORMAL HIGH (ref 0.0–1.9)

## 2018-08-11 LAB — LIPID PANEL
CHOL/HDL RATIO: 4
Cholesterol: 112 mg/dL (ref 0–200)
HDL: 31 mg/dL — AB (ref >39.00)
LDL CALC: 42 mg/dL (ref 0–99)
NonHDL: 80.69
Triglycerides: 193 mg/dL — ABNORMAL HIGH (ref 0.0–149.0)
VLDL: 38.6 mg/dL (ref 0.0–40.0)

## 2018-08-11 NOTE — Telephone Encounter (Signed)
Prevnar, flu. Ok to do these in October together.

## 2018-08-11 NOTE — Telephone Encounter (Signed)
Patient notified through open MyChart communication

## 2018-08-11 NOTE — Telephone Encounter (Signed)
Pt sent [place] a message with what immunizations he had gotten done. These have been abstracted and patient was notified that we received them.

## 2018-08-11 NOTE — Progress Notes (Signed)
Per orders of Dr. Ethlyn Gallery, injection of Shigrix given by Franco Collet. Patient tolerated injection well.

## 2018-08-11 NOTE — Telephone Encounter (Signed)
Left message for patient to call back. CRM created.  I do not see mention of him needing immunization in OV notes. Please advise

## 2018-08-11 NOTE — Telephone Encounter (Signed)
Patient was in the parking lot when I called so he came inside to discuss message. Patient wanted to know what other immunizations Dr. Ethlyn Gallery recommend that he have and stated that he wanted a flu shot in October. I informed patient that I had sent his message to Dr. Ethlyn Gallery to see which immunizations she recommends and that if he does not have to have a follow up after his oncology visit we could schedule a nurse visit to have his immunizations done. I advised patient that I would respond to his MyChart message asking about the immunizations as soon as I heard from Dr. Ethlyn Gallery and once we knew which immunizations she recommends we could schedule a OV or Nurse Visit. Patient expressed understanding and agrees with plan.

## 2018-08-11 NOTE — Telephone Encounter (Signed)
Immunizations discussed during the last visit have been approved by patient's oncologist.  Patient is requesting a call to discuss further getting these vaccinations.

## 2018-08-14 ENCOUNTER — Other Ambulatory Visit: Payer: Self-pay | Admitting: Family Medicine

## 2018-08-14 ENCOUNTER — Ambulatory Visit (INDEPENDENT_AMBULATORY_CARE_PROVIDER_SITE_OTHER): Payer: Medicare HMO | Admitting: Pharmacist

## 2018-08-14 ENCOUNTER — Encounter: Payer: Self-pay | Admitting: Family Medicine

## 2018-08-14 VITALS — BP 110/64 | HR 64

## 2018-08-14 DIAGNOSIS — E119 Type 2 diabetes mellitus without complications: Secondary | ICD-10-CM

## 2018-08-14 DIAGNOSIS — I5022 Chronic systolic (congestive) heart failure: Secondary | ICD-10-CM | POA: Diagnosis not present

## 2018-08-14 NOTE — Progress Notes (Signed)
Patient ID: Lee Hall                 DOB: 1951/10/05                      MRN: [id number]     HPI: Lee Hall is a 67 y.o. male referred by Dr. Lovena Le for heart failure medication optimization. PMH is significant for VT, PVC, CHF (EF 25-35%, 12/2017), ICM, s/p MI, melanoma, and anxiety. Pt reports getting short of breath more easily than he has in the past when doing daily activities (~20 minutes into house work). Denies shortness of breath laying down, some difficulties tying shoes.   Pt was seen by Dr. Lovena Le 07/14/18, where his lisinopril was discontinued and Entresto 24/26 mg twice daily was started due to worsening volume-related symptoms. Patient confirms taking Entresto twice daily. Reports similar energy levels on Entresto as he had before. He reports after taking his first dose of Entresto he became very nauseated and foggy/lighteheaded which resolved two hours later. He felt similar the next day but this particular adverse effect has not returned since then. He reports feeling foggy, like "four glasses of wine foggy,"  4 out of 7 days of the week, which self-resolves a few hours later. He reports checking blood pressure/pulse during these episodes: HR 60-65, SBP 115-124/70s, no difference in blood pressure than on a normal day or when comparing to a few months ago.  Blood sugars AM: 155-158; 203 this AM Approximate medication schedule (takes with a slice of bread or two): 630: carvedilol, amiodarone 700: entresto 715: aspirin, glimepiride, metformin - not always eating breakfast at this time  Current CHF meds: entresto 24-26 mg twice daily, carvedilol 6.25 mg twice daily Previously tried: none BP goal: < 130/80  Family History: Father: kidney stones  Social History: (+) social alcohol (glass of red wine on Sunday), (-) nicotine, quit 08/2003 after MI   Diet: Breakfast: one scrambled egg, gravy/biscuit; Lunch: if eats later breakfast, skips, if doesn't, eats  barbecue sandwich (no fries); Dinner: spaghetti or hot dogs or hamburgers or deli/chicken sandwich or pizza once a week or grilled chicken. Loves vegetables but doesn't eat them often. Occasional sweets, once a week. He drinks mainly unsweetened ice tea, ~20 oz a day. He doesn't add salt food.  Exercise: walking one hour 4-5 times/week; previously very active in job  Home BP readings: HR 60-65, SBP 115-124/70s  Wt Readings from Last 3 Encounters:  08/10/18 189 lb 9.6 oz (86 kg)  08/07/18 188 lb 12.8 oz (85.6 kg)  07/14/18 191 lb (86.6 kg)   BP Readings from Last 3 Encounters:  08/10/18 116/75  08/07/18 120/78  07/14/18 130/74   Pulse Readings from Last 3 Encounters:  08/10/18 64  08/07/18 70  07/14/18 77    Renal function: CrCl cannot be calculated (Patient's most recent lab result is older than the maximum 21 days allowed.).  Past Medical History:  Diagnosis Date  . Anxiety   . CAD (coronary artery disease)   . CAD, NATIVE VESSEL 02/18/2009   Qualifier: Diagnosis of  By: Stevie Kern NP-C, Sharyn Lull    . Encounter for long-term (current) use of high-risk medication   . Heart failure, systolic, acute on chronic ([place])   . ICD (implantable cardiac defibrillator), single, in situ   . ICD - IN SITU 01/02/2009   Qualifier: Diagnosis of  By: Peri Maris    . ISCHEMIC CARDIOMYOPATHY 03/19/2009   Qualifier: Diagnosis of  By: Boyce Medici, RN, BSN, [place]    . LEFT VENTRICULAR MURAL THROMBUS 02/18/2009   Qualifier: Diagnosis of  By: Stevie Kern NP-C, Sharyn Lull    . Long term current use of aspirin   . Melanoma ([place]) 2019  . MI (myocardial infarction) ([place]) 08/2003  . PAROXYSMAL VENTRICULAR TACHYCARDIA 03/19/2009   Qualifier: Diagnosis of  By: Boyce Medici RN, BSN, [place]    . SYSTOLIC HEART FAILURE, ACUTE ON CHRONIC 01/02/2009   Qualifier: Diagnosis of  By: Stevie Kern NP-C, Sharyn Lull    . TACHYCARDIA 01/02/2009   Qualifier: Diagnosis of  By: Peri Maris    . Ventricular tachycardia ([place])      Current Outpatient Medications on File Prior to Visit  Medication Sig Dispense Refill  . acyclovir (ZOVIRAX) 800 MG tablet Take 800 mg by mouth daily.    Marland Kitchen amiodarone (PACERONE) 200 MG tablet Take 200 mg by mouth daily.      Marland Kitchen aspirin 81 MG tablet Take 81 mg by mouth daily.      . carvedilol (COREG) 6.25 MG tablet Take 6.25 mg by mouth 2 (two) times daily with a meal.    . glimepiride (AMARYL) 2 MG tablet Take 8 mg by mouth daily.    Marland Kitchen ipratropium (ATROVENT) 0.06 % nasal spray Place 2 sprays into both nostrils 3 (three) times daily.    . metFORMIN (GLUCOPHAGE) 500 MG tablet Take 1 tablet (500 mg total) by mouth 2 (two) times daily with a meal. 60 tablet 2  . mexiletine (MEXITIL) 150 MG capsule Take 150 mg by mouth 3 (three) times daily.    . rosuvastatin (CRESTOR) 10 MG tablet     . sacubitril-valsartan (ENTRESTO) 24-26 MG Take 1 tablet by mouth 2 (two) times daily. 60 tablet 11   No current facility-administered medications on file prior to visit.     No Known Allergies  Assessment/Plan:  1. Heart Failure - Blood pressure/HR well controlled on current regimen of Entresto 24-26 mg twice daily and carvedilol 6.25 mg twice daily. He is complaining of foggy spells about 4 days a week that occur in the morning and spontaneously resolve a few hours later. It is unlikely these spells are related to [facility] Concerned these could be hypoglycemic spells; however, patient reports no previous episodes nor low blood sugars when these occur. Recommend shifting glimepiride to be taken with the first big meal of the day (as opposed to with one slice of bread). Will not change Entresto or carvedilol at this time due to controlled blood pressure. Will obtain BMET today to follow up on renal function and electrolytes following initiation of Entresto. F/u in pharmacy clinic in 4 weeks to further optimize HF medications if BP allows.    Claiborne Billings, PharmD PGY2 Cardiology Pharmacy  Resident 08/14/2018 1:57 PM

## 2018-08-14 NOTE — Patient Instructions (Addendum)
Keep taking your medications as you are taking them: Entresto 24-26 mg twice daily and carvedilol 6.25 mg twice daily.  Start taking glimepiride with first large meal of the day (either brunch or lunch).  Continue to monitor blood pressure/blood sugar, especially when having foggy episodes.  Do not hesitate to reach out to the clinic with any questions [phone].

## 2018-08-15 LAB — BASIC METABOLIC PANEL
BUN / CREAT RATIO: 18 (ref 10–24)
BUN: 19 mg/dL (ref 8–27)
CO2: 24 mmol/L (ref 20–29)
CREATININE: 1.05 mg/dL (ref 0.76–1.27)
Calcium: 9.7 mg/dL (ref 8.6–10.2)
Chloride: 102 mmol/L (ref 96–106)
GFR calc Af Amer: 86 mL/min/{1.73_m2} (ref >59)
GFR calc non Af Amer: 74 mL/min/{1.73_m2} (ref >59)
GLUCOSE: 144 mg/dL — AB (ref 65–99)
Potassium: 4.7 mmol/L (ref 3.5–5.2)
SODIUM: 141 mmol/L (ref 134–144)

## 2018-08-16 ENCOUNTER — Encounter: Payer: Self-pay | Admitting: Oncology

## 2018-08-21 ENCOUNTER — Telehealth: Payer: Self-pay

## 2018-08-21 NOTE — Telephone Encounter (Signed)
Per a [facility] message from the pt I have done an [place] PA through covermymeds. Key: [employer]

## 2018-08-22 NOTE — Telephone Encounter (Signed)
**Note De-Identified Jeison Delpilar Obfuscation** Letter received Darey Hershberger fax from [facility] stating that the pts Entresto PA has been approved. Approval good until 08/21/2020.   I have notified the pts pharmacy of this approval.

## 2018-08-24 ENCOUNTER — Encounter (HOSPITAL_COMMUNITY): Payer: Medicare HMO

## 2018-09-04 ENCOUNTER — Ambulatory Visit (HOSPITAL_COMMUNITY)
Admission: RE | Admit: 2018-09-04 | Discharge: 2018-09-04 | Disposition: A | Discharge status: Home / Self Care | Payer: Medicare HMO | Source: Ambulatory Visit | Attending: Oncology | Admitting: Oncology

## 2018-09-04 DIAGNOSIS — C439 Malignant melanoma of skin, unspecified: Secondary | ICD-10-CM | POA: Diagnosis not present

## 2018-09-04 DIAGNOSIS — C779 Secondary and unspecified malignant neoplasm of lymph node, unspecified: Secondary | ICD-10-CM | POA: Insufficient documentation

## 2018-09-04 DIAGNOSIS — C773 Secondary and unspecified malignant neoplasm of axilla and upper limb lymph nodes: Secondary | ICD-10-CM | POA: Diagnosis not present

## 2018-09-04 LAB — GLUCOSE, CAPILLARY: Glucose-Capillary: 149 mg/dL — ABNORMAL HIGH (ref 70–99)

## 2018-09-04 MED ORDER — FLUDEOXYGLUCOSE F - 18 (FDG) INJECTION
9.4400 | Freq: Once | INTRAVENOUS | Status: AC | PRN
  Administered 2018-09-04: 9.44 via INTRAVENOUS

## 2018-09-05 ENCOUNTER — Other Ambulatory Visit: Payer: Self-pay | Admitting: Cardiology

## 2018-09-05 MED ORDER — AMIODARONE HCL 200 MG PO TABS: 200.0000 mg | ORAL_TABLET | Freq: Every day | ORAL | 3 refills | Status: DC

## 2018-09-05 MED ORDER — CARVEDILOL 6.25 MG PO TABS: 6.2500 mg | ORAL_TABLET | Freq: Two times a day (BID) | ORAL | 3 refills | Status: DC

## 2018-09-05 NOTE — Telephone Encounter (Signed)
[facility] pharmacy is requesting a refill on ipratropium 0.06 % nasal spray. Would Dr. Curt Bears like to refill this medication? Please address

## 2018-09-05 NOTE — Telephone Encounter (Signed)
Can refill Amiodarone & Carvedilol Pt should get Atrovent spray refills from ordering provider/PCP.

## 2018-09-06 ENCOUNTER — Telehealth: Payer: Self-pay

## 2018-09-06 MED ORDER — CARVEDILOL 6.25 MG PO TABS: 6.2500 mg | ORAL_TABLET | Freq: Two times a day (BID) | ORAL | 3 refills | Status: DC

## 2018-09-06 MED ORDER — AMIODARONE HCL 200 MG PO TABS: 200.0000 mg | ORAL_TABLET | Freq: Every day | ORAL | 3 refills | Status: DC

## 2018-09-06 NOTE — Addendum Note (Signed)
Addended by: Stanton Kidney on: 09/06/2018 02:18 PM   Modules accepted: Orders

## 2018-09-06 NOTE — Telephone Encounter (Signed)
Pt pharmacy [facility] is requesting a refill for rosuvastatin 10mg  tablet. Would Dr. Curt Bears like to refill this medication? Please address

## 2018-09-06 NOTE — Telephone Encounter (Signed)
Will forward to pt's primary cardiologist to address

## 2018-09-08 ENCOUNTER — Other Ambulatory Visit: Payer: Self-pay | Admitting: Family Medicine

## 2018-09-08 DIAGNOSIS — E119 Type 2 diabetes mellitus without complications: Secondary | ICD-10-CM

## 2018-09-08 NOTE — Telephone Encounter (Signed)
Copied from [place] [id number]. Topic: Quick Communication - Rx Refill/Question >> Sep 08, 2018 12:27 PM Keene Breath wrote: Medication: metFORMIN (GLUCOPHAGE) 500 MG tablet  Christin with [facility] called to request refill for the above medication.  CB# for patient is [phone]  Preferred Pharmacy (with phone number or street name): [place], [place] [phone] (Phone) [phone] (Fax)

## 2018-09-10 ENCOUNTER — Encounter: Payer: Self-pay | Admitting: Oncology

## 2018-09-10 NOTE — Progress Notes (Signed)
Patient ID: Lee Hall                 DOB: 1951/05/12                      MRN: [id number]     HPI: Lee Hall is a 67 y.o. male referred by Dr. Lovena Le for heart failure medication optimization. PMH is significant for VT, PVC, CHF (EF 25-35%, 12/2017), ICM, s/p MI, melanoma, and anxiety. Pt reports getting short of breath more easily than he has in the past when doing daily activities (~20 minutes into house work). Denies shortness of breath laying down, some difficulties tying shoes. Pt was seen by Dr. Lovena Le 07/14/18 and transitioned to Entresto 24/26 mg twice daily due to worsening volume-related symptoms. Last clinic visit, he reported feeling foggy, like "four glasses of wine foggy,"  4 days a week, which self-resolves a few hours later. He reports checking blood pressure/pulse during these episodes: HR 60-65, SBP 115-124/70s. Thought that these episodes could be related to hypoglycemia as patient was taking glimepiride with minimal food. He was instructed to start taking glimepiride with full meal.   Today he presents in good spirits. Last visit, he reported early AM fogginess. Now he is experiencing afternoon fogginess/tunnel vision around afternoon timing of mexiletine and metformin. He started taking metformin three times a day (AM, 3PM, evening) a few weeks ago after misunderstanding a MyChart message which recommended to increase to metformin 1000 mg twice daily. He also reports decreased stamina and and exercise tolerance where in the past he was previously not limited. He started decreasing his glimepiride three weeks ago (reports sugars previously ~150), and stopped taking it about a week ago. He reports no difference in blood sugar readings, 130s-170s, refer to Dr Berenice Bouton note for exact numbers. He reports symptom tunnel vision/fogginess duration has decreased by about 50%, but still persists 4-5 times/week. He also reports feeling a racing heart and cramp near heart  ~3x/week,. He reports he has discussed with Dr. Lovena Le in the past, who had noted there were no concerning rhythms detected on his pacemaker.  Approximate medication schedule: 700: aspirin, mexiletine, coreg, amiodarone with cheerios 800: Entresto with granola bar 900: metformin   Current CHF meds: Entresto 24-26 mg twice daily, carvedilol 6.25 mg twice daily Previously tried: none BP goal: < 130/80  Family History: Father: kidney stones  Social History: (+) social alcohol (glass of red wine on Sunday), (-) nicotine, quit 08/2003 after MI   Diet: Breakfast: one scrambled egg, gravy/biscuit; Lunch: if eats later breakfast, skips, if doesn't, eats barbecue sandwich (no fries); Dinner: spaghetti or hot dogs or hamburgers or deli/chicken sandwich or pizza once a week or grilled chicken. Loves vegetables but doesn't eat them often. Occasional sweets, once a week. He drinks mainly unsweetened ice tea, ~20 oz a day. He doesn't add salt food.  Exercise: walking one hour 4-5 times/week; previously very active in job  Home BP readings: HR 60s, SBP 110-120s/60-70; reports occasionally SBP 100s  Wt Readings from Last 3 Encounters:  09/11/18 188 lb 6.4 oz (85.5 kg)  08/10/18 189 lb 9.6 oz (86 kg)  08/07/18 188 lb 12.8 oz (85.6 kg)   BP Readings from Last 3 Encounters:  09/11/18 110/60  09/11/18 122/60  08/14/18 110/64   Pulse Readings from Last 3 Encounters:  09/11/18 64  09/11/18 65  08/14/18 64    Renal function: CrCl cannot be calculated (Patient's most recent lab result is  older than the maximum 21 days allowed.).  Past Medical History:  Diagnosis Date  . Anxiety   . CAD (coronary artery disease)   . CAD, NATIVE VESSEL 02/18/2009   Qualifier: Diagnosis of  By: Stevie Kern NP-C, Sharyn Lull    . Encounter for long-term (current) use of high-risk medication   . Heart failure, systolic, acute on chronic ([place])   . ICD (implantable cardiac defibrillator), single, in situ   . ICD - IN SITU  01/02/2009   Qualifier: Diagnosis of  By: Peri Maris    . ISCHEMIC CARDIOMYOPATHY 03/19/2009   Qualifier: Diagnosis of  By: Boyce Medici RN, BSN, [place]    . LEFT VENTRICULAR MURAL THROMBUS 02/18/2009   Qualifier: Diagnosis of  By: Stevie Kern NP-C, Sharyn Lull    . Long term current use of aspirin   . Melanoma ([place]) 2019  . MI (myocardial infarction) ([place]) 08/2003  . PAROXYSMAL VENTRICULAR TACHYCARDIA 03/19/2009   Qualifier: Diagnosis of  By: Boyce Medici RN, BSN, [place]    . SYSTOLIC HEART FAILURE, ACUTE ON CHRONIC 01/02/2009   Qualifier: Diagnosis of  By: Stevie Kern NP-C, Sharyn Lull    . TACHYCARDIA 01/02/2009   Qualifier: Diagnosis of  By: Peri Maris    . Ventricular tachycardia ([place])     Current Outpatient Medications on File Prior to Visit  Medication Sig Dispense Refill  . acyclovir (ZOVIRAX) 800 MG tablet Take 800 mg by mouth daily.    Marland Kitchen amiodarone (PACERONE) 200 MG tablet Take 1 tablet (200 mg total) by mouth daily. 90 tablet 3  . aspirin 81 MG tablet Take 81 mg by mouth daily.      . carvedilol (COREG) 6.25 MG tablet Take 1 tablet (6.25 mg total) by mouth 2 (two) times daily with a meal. 180 tablet 3  . metFORMIN (GLUCOPHAGE) 500 MG tablet Take 1 tablet (500 mg total) by mouth 2 (two) times daily with a meal. 60 tablet 2  . mexiletine (MEXITIL) 150 MG capsule Take 150 mg by mouth 3 (three) times daily.    . rosuvastatin (CRESTOR) 10 MG tablet     . sacubitril-valsartan (ENTRESTO) 24-26 MG Take 1 tablet by mouth 2 (two) times daily. 60 tablet 11   No current facility-administered medications on file prior to visit.     No Known Allergies  Assessment/Plan:  1. Heart Failure - Blood pressure/HR remain well-controlled on current regimen of Entresto 24-26 mg twice daily and carvedilol 6.25 mg twice daily. Patient continues to experience tunnel vision/foggy spells 4-5 times per week; however the timing of these spells have shifted from early morning to afternoon. Concerned these may  still be hypoglycemic spells as patient has started taking metformin in the afternoon, and AM symptoms have resolved with stopping glimepiride in the morning. Will send Dr. Idelle Jo a note regarding thoughts on glucose lowering medications. Will not increase Entresto or carvedilol at this time due to controlled blood pressure. Patient was given the clinic phone number and instructed to call if blood pressure remains elevated to allow for further optimization of HF medications. Follow up in clinic prn.   Claiborne Billings, PharmD PGY2 Cardiology Pharmacy Resident 09/11/2018 2:37 PM

## 2018-09-11 ENCOUNTER — Ambulatory Visit (INDEPENDENT_AMBULATORY_CARE_PROVIDER_SITE_OTHER): Payer: Medicare HMO | Admitting: Pharmacist

## 2018-09-11 ENCOUNTER — Encounter: Payer: Self-pay | Admitting: Family Medicine

## 2018-09-11 ENCOUNTER — Ambulatory Visit (INDEPENDENT_AMBULATORY_CARE_PROVIDER_SITE_OTHER): Payer: Medicare HMO | Admitting: Family Medicine

## 2018-09-11 VITALS — BP 110/60 | HR 64

## 2018-09-11 VITALS — BP 122/60 | HR 65 | Temp 97.7°F | Wt 188.4 lb

## 2018-09-11 DIAGNOSIS — E785 Hyperlipidemia, unspecified: Secondary | ICD-10-CM

## 2018-09-11 DIAGNOSIS — I5022 Chronic systolic (congestive) heart failure: Secondary | ICD-10-CM | POA: Diagnosis not present

## 2018-09-11 DIAGNOSIS — C439 Malignant melanoma of skin, unspecified: Secondary | ICD-10-CM

## 2018-09-11 DIAGNOSIS — Z23 Encounter for immunization: Secondary | ICD-10-CM | POA: Diagnosis not present

## 2018-09-11 DIAGNOSIS — E1169 Type 2 diabetes mellitus with other specified complication: Secondary | ICD-10-CM | POA: Diagnosis not present

## 2018-09-11 MED ORDER — IPRATROPIUM BROMIDE 0.06 % NA SOLN: 2.0000 | Freq: Three times a day (TID) | NASAL | 3 refills | Status: DC

## 2018-09-11 MED ORDER — METFORMIN HCL ER 500 MG PO TB24: 1000.0000 mg | ORAL_TABLET | Freq: Every day | ORAL | 3 refills | Status: DC

## 2018-09-11 NOTE — Patient Instructions (Addendum)
Continue carvedilol 6.25mg  and Entresto 24/26mg  TWICE daily.   We will send a message to your primary care doctor about our recommendations for your blood sugar medications.   Please continue to monitor your blood pressures occasionally and call the clinic [phone] if you have any issues.

## 2018-09-11 NOTE — Progress Notes (Signed)
Lee Hall DOB: 08-16-1951 Encounter date: 09/11/2018  This is a 67 y.o. male who presents with Chief Complaint  Patient presents with  . Follow-up    still having tunnel vision but it's only lasting about 93mins,     History of present illness:  For years took metformin ER and was taking 2000mg  for last year that he was on this. In December/Jan stopped the metformin (previous physician told him it was not working any longer) and that was when he started glimepiride at 2mg  with titration up to 8mg  for several months. As long as he took it with food; he didn't have any issues. Wasn't seeing any improvement with sugars on initial dosing of metformin and so now taking 500mg  TID. Still taking glimepiride. Getting tunnel vision for 40 minutes after starting metformin. Saw pharmacist and she recommended getting some food on stomach when taking medications and not taking together. Tunnel vision was less in duration, but still present. Ended up quitting glimepiride and stayed with the metformin. Has f/u with pharmacist this afternoon so just wanted to clarify. Came off the glimepiride about 10-12 days ago now. Blood sugars have ben in the 177 164 186 154 294 170 149 212 193 136  192 151 114 208 153 123 152 146 119 128  (fasting and 2 hr post prandial). He would take sugars with tunnel vision and they were somewhere in above range; never noting hypoglycemia. Does try to take food with medication. Tunnel vision can still happen in the afternoon about 2pm. Takes afternoon medication with food. Separated mexilitine and crestor doses in afternoon. Has cloudy feeling like he has had too much alcohol. Pulse a little high, a little funny feeling in pit of stomach, not feeling like passing out. Just felt foggy. All symptoms pass within minutes.   Since event in March this year with ablation/VT has had harder time taking medications. Just feels like he is more sensitive to them.  Still wakes up one or two  nights weekly with some panic that defib has gone off. Going to bed around 11 now (an hour later) and finds he is sleeping better. Overall worry level is a little lower than it was at last visit.    Last time we added metformin to try along with glimepiride: Sleep issues: Has been following with cardiology for VT/PVC/CHF/ICM/s/p MI controlled on Entresto 24-26mg  twice daily and carvedilol 6.25mg  BID.   Seeing Dr. Roxy Cedar but not until October 10, 2018. He is worried about PET scan results and not hearing about this until that time. States no known trauma left ribcage although did used to rodeo horses, ride bikes.   No Known Allergies Current Meds  Medication Sig  . acyclovir (ZOVIRAX) 800 MG tablet Take 800 mg by mouth daily.  Marland Kitchen amiodarone (PACERONE) 200 MG tablet Take 1 tablet (200 mg total) by mouth daily.  Marland Kitchen aspirin 81 MG tablet Take 81 mg by mouth daily.    . carvedilol (COREG) 6.25 MG tablet Take 1 tablet (6.25 mg total) by mouth 2 (two) times daily with a meal.  . ipratropium (ATROVENT) 0.06 % nasal spray Place 2 sprays into both nostrils 3 (three) times daily.  Marland Kitchen mexiletine (MEXITIL) 150 MG capsule Take 150 mg by mouth 3 (three) times daily.  . rosuvastatin (CRESTOR) 10 MG tablet   . sacubitril-valsartan (ENTRESTO) 24-26 MG Take 1 tablet by mouth 2 (two) times daily.  . [DISCONTINUED] glimepiride (AMARYL) 2 MG tablet Take 8 mg by mouth daily.  . [  DISCONTINUED] ipratropium (ATROVENT) 0.06 % nasal spray Place 2 sprays into both nostrils 3 (three) times daily.  . [DISCONTINUED] metFORMIN (GLUCOPHAGE) 500 MG tablet Take 1 tablet (500 mg total) by mouth 2 (two) times daily with a meal.    Review of Systems  Constitutional: Positive for fatigue (just not feeling like he has stamina that he used to have). Negative for chills and fever.  Respiratory: Positive for shortness of breath (after going up flight of stairs). Negative for cough, chest tightness and wheezing.   Cardiovascular:  Negative for chest pain, palpitations and leg swelling.  Psychiatric/Behavioral: The patient is nervous/anxious (does worry about defibrillator going off; this is improving somewhat however).     Objective:  BP 122/60 (BP Location: Left Arm, Patient Position: Sitting, Cuff Size: Normal)   Pulse 65   Temp 97.7 F (36.5 C) (Oral)   Wt 188 lb 6.4 oz (85.5 kg)   SpO2 97%   BMI 26.28 kg/m   Weight: 188 lb 6.4 oz (85.5 kg)   BP Readings from Last 3 Encounters:  09/11/18 110/60  09/11/18 122/60  08/14/18 110/64   Wt Readings from Last 3 Encounters:  09/11/18 188 lb 6.4 oz (85.5 kg)  08/10/18 189 lb 9.6 oz (86 kg)  08/07/18 188 lb 12.8 oz (85.6 kg)    Physical Exam  Constitutional: He is oriented to person, place, and time. He appears well-developed and well-nourished. No distress.  Cardiovascular: Normal rate, regular rhythm and normal heart sounds. Exam reveals no friction rub.  No murmur heard. No lower extremity edema  Pulmonary/Chest: Effort normal and breath sounds normal. No respiratory distress. He has no wheezes. He has no rales.  Neurological: He is alert and oriented to person, place, and time.  Psychiatric: His behavior is normal. Judgment normal. His mood appears anxious. Cognition and memory are normal.  He is very worried about medical illness going on. He worries about heart history; recurrence of heart issues. He is worried about melanoma. He is frustrated with increased fatigue and worries that it is heart related or that he body is just not handling medical issues as well as it has in the past.     Assessment/Plan  1. Need for pneumococcal vaccination - Pneumococcal conjugate vaccine 13-valent IM  2. Need for influenza vaccination - Flu vaccine HIGH DOSE PF (Fluzone High dose)  3. Hyperlipidemia associated with type 2 diabetes mellitus ([place]) We are going to change metformin to long acting and give in evening to minimize symptoms. Uncertain if tunnel vision sx  are coming from this medication since he relates to variety of new meds/possible timing of meds and since sugars don't correlate with hypoglycemia, but will continue to monitor. If not improving  - TSH; Future - Hemoglobin A1c; Future - Comprehensive metabolic panel; Future  4. Melanoma of skin [facility]) He has message out to oncology regarding PET scan results and is anxious about having this discussion. He will let me know if he is not able to get in touch with them by weds.    Return pending bloodwork.     Micheline Rough, MD

## 2018-09-11 NOTE — Progress Notes (Signed)
Pharmacist did touch base with me and I agree with recommendation to transition to metformin ER 1000mg  qhs. Let's try this for next few months and see if episodes improve. If still getting tunnel vision or certainly if any worsening in next month let me know!  (I will send metformin to [facility]) for next few days let's limit the metformin shorter acting type to just BID instead of TID.

## 2018-09-11 NOTE — Patient Instructions (Addendum)
Please let me know if you haven't heard from your oncologist by Weds; I am happy to facilitate an earlier follow up or discussion if needed.   Let me know about meeting with pharmacist and med changes made (I will also look for note). I will send in your metformin pending this but would consider longer acting so you are dosing once daily. Ask about timeline for improvement with entresto. Is there something else you can do to help with fatigue/stamina.

## 2018-09-12 ENCOUNTER — Telehealth: Payer: Self-pay

## 2018-09-12 NOTE — Telephone Encounter (Signed)
Spoke with patient, he expressed understanding. Nothing further needed.

## 2018-09-12 NOTE — Telephone Encounter (Signed)
-----   Message from [name], MD sent at 09/11/2018  2:59 PM EDT -----   ----- Message ----- From: Wyatt Haste, [facility] Sent: 09/11/2018   2:54 PM EDT To: [name], MD  Saw patient in clinic today. He reports taking metformin 500 mg TID. Wondering if these tunnel vision episodes are still hypoglycemia (as AM episodes have now shifted to afternoon, and he started taking metformin in the afternoon). Would transition to metformin ER 1000 mg qHS and continue off glimepride and see where sugars go/ if symptoms resolve. Could consider jardiance in future if need further blood sugar lowering. Unfortunately, no room in HR or BP to increase coreg/entresto at this time. We won't schedule another follow up appt at this time.

## 2018-09-12 NOTE — Telephone Encounter (Signed)
Per Dr.[name]  Pharmacist did touch base with me and I agree with recommendation to transition to metformin ER 1000mg  qhs. Let's try this for next few months and see if episodes improve. If still getting tunnel vision or certainly if any worsening in next month let me know!  (I will send metformin to [facility]) for next few days let's limit the metformin shorter acting type to just BID instead of TID.

## 2018-09-13 ENCOUNTER — Encounter: Payer: Self-pay | Admitting: Family Medicine

## 2018-09-14 ENCOUNTER — Other Ambulatory Visit: Payer: Self-pay | Admitting: Oncology

## 2018-09-14 DIAGNOSIS — C439 Malignant melanoma of skin, unspecified: Secondary | ICD-10-CM

## 2018-09-14 NOTE — Progress Notes (Signed)
Results of the PET scan discussed today with Mr. Longest over the phone.  He does have axillary lymphadenopathy as well as potentially an L1 indeterminate lesion.  Management options were reviewed and I have recommended given his limited disease to the axilla for an evaluation for lymph node dissection of his right axillary basin.  I will also obtain CT scan of the lumbar spine specifically.  He has a pacemaker in place and cannot have an MRI.  If there is no evidence of metastatic disease in the spine, I am in favor of aggressive lymph node dissection followed by adjuvant immunotherapy.

## 2018-09-16 ENCOUNTER — Encounter: Payer: Self-pay | Admitting: Family Medicine

## 2018-09-18 ENCOUNTER — Other Ambulatory Visit: Payer: Self-pay | Admitting: General Surgery

## 2018-09-18 ENCOUNTER — Other Ambulatory Visit: Payer: Self-pay | Admitting: Family Medicine

## 2018-09-18 DIAGNOSIS — C439 Malignant melanoma of skin, unspecified: Secondary | ICD-10-CM | POA: Diagnosis not present

## 2018-09-18 DIAGNOSIS — R42 Dizziness and giddiness: Secondary | ICD-10-CM

## 2018-09-18 DIAGNOSIS — C779 Secondary and unspecified malignant neoplasm of lymph node, unspecified: Secondary | ICD-10-CM | POA: Diagnosis not present

## 2018-09-21 ENCOUNTER — Ambulatory Visit (HOSPITAL_COMMUNITY)
Admission: RE | Admit: 2018-09-21 | Discharge: 2018-09-21 | Disposition: A | Discharge status: Home / Self Care | Payer: Medicare HMO | Source: Ambulatory Visit | Attending: Oncology | Admitting: Oncology

## 2018-09-21 DIAGNOSIS — R9389 Abnormal findings on diagnostic imaging of other specified body structures: Secondary | ICD-10-CM | POA: Insufficient documentation

## 2018-09-21 DIAGNOSIS — C439 Malignant melanoma of skin, unspecified: Secondary | ICD-10-CM | POA: Insufficient documentation

## 2018-09-21 DIAGNOSIS — M5126 Other intervertebral disc displacement, lumbar region: Secondary | ICD-10-CM | POA: Diagnosis not present

## 2018-09-21 MED ORDER — SODIUM CHLORIDE 0.9 % IJ SOLN
INTRAMUSCULAR | Status: AC
  Filled 2018-09-21: qty 50

## 2018-09-21 MED ORDER — IOHEXOL 300 MG/ML  SOLN
100.0000 mL | Freq: Once | INTRAMUSCULAR | Status: AC | PRN
  Administered 2018-09-21: 100 mL via INTRAVENOUS

## 2018-09-22 ENCOUNTER — Telehealth: Payer: Self-pay

## 2018-09-22 NOTE — Telephone Encounter (Signed)
Contacted patient and made aware of results.

## 2018-09-22 NOTE — Telephone Encounter (Signed)
-----   Message from Wyatt Portela, MD sent at 09/21/2018 12:34 PM EDT ----- Please let him know his scan is normal.

## 2018-09-26 DIAGNOSIS — C4361 Malignant melanoma of right upper limb, including shoulder: Secondary | ICD-10-CM | POA: Diagnosis not present

## 2018-09-26 DIAGNOSIS — L280 Lichen simplex chronicus: Secondary | ICD-10-CM | POA: Diagnosis not present

## 2018-09-26 NOTE — Telephone Encounter (Signed)
Please advise 

## 2018-09-27 NOTE — Pre-Procedure Instructions (Signed)
Lee Hall  09/27/2018      [facility] [place], [place] [address] Phone: [phone] Fax: [phone]    Your procedure is scheduled on October 05, 2018.  Report to [facility] Admitting at 0900 AM.  Call this number if you have problems the morning of surgery:  [phone]   Remember:  Do not eat or drink after midnight.  You may drink clear liquids until 800 AM the morning of surgery.  Clear liquids allowed are:  Water, Juice (non-citric and without pulp), Clear Tea, Black Coffee only and Gatorade    Take these medicines the morning of surgery with A SIP OF WATER  Amiodarone (pacerone) Carvedilol (coreg) Atrovent Nasal Spray-if needed Mexiletine (mexitil) Entresto  Follow your surgeon's instructions on when to hold/resume aspirin.  If no instructions were given call the office to determine how they would like to you take aspirin  7 days prior to surgery STOP taking any Aleve, Naproxen, Ibuprofen, Motrin, Advil, Goody's, BC's, all herbal medications, fish oil, and all vitamins   WHAT DO I DO ABOUT MY DIABETES MEDICATION?   Marland Kitchen Do not take oral diabetes medicines (pills) the morning of surgery-metformin (glucophage)   Reviewed and Endorsed by [facility] Patient Education Committee, August 2015  How to Manage Your Diabetes Before and After Surgery  Why is it important to control my blood sugar before and after surgery? . Improving blood sugar levels before and after surgery helps healing and can limit problems. . A way of improving blood sugar control is eating a healthy diet by: o  Eating less sugar and carbohydrates o  Increasing activity/exercise o  Talking with your doctor about reaching your blood sugar goals . High blood sugars (greater than 180 mg/dL) can raise your risk of infections and slow your recovery, so you will need to focus on controlling your  diabetes during the weeks before surgery. . Make sure that the doctor who takes care of your diabetes knows about your planned surgery including the date and location.  How do I manage my blood sugar before surgery? . Check your blood sugar at least 4 times a day, starting 2 days before surgery, to make sure that the level is not too high or low. o Check your blood sugar the morning of your surgery when you wake up and every 2 hours until you get to the Short Stay unit. . If your blood sugar is less than 70 mg/dL, you will need to treat for low blood sugar: o Do not take insulin. o Treat a low blood sugar (less than 70 mg/dL) with  cup of clear juice (cranberry or apple), 4 glucose tablets, OR glucose gel. Recheck blood sugar in 15 minutes after treatment (to make sure it is greater than 70 mg/dL). If your blood sugar is not greater than 70 mg/dL on recheck, call [phone] o  for further instructions. . Report your blood sugar to the short stay nurse when you get to Short Stay.  . If you are admitted to the hospital after surgery: o Your blood sugar will be checked by the staff and you will probably be given insulin after surgery (instead of oral diabetes medicines) to make sure you have good blood sugar levels. o The goal for blood sugar control after surgery is 80-180 mg/dL.   [facility]- Preparing For Surgery  Before surgery, you can play an important  role. Because skin is not sterile, your skin needs to be as free of germs as possible. You can reduce the number of germs on your skin by washing with CHG (chlorahexidine gluconate) Soap before surgery.  CHG is an antiseptic cleaner which kills germs and bonds with the skin to continue killing germs even after washing.    Oral Hygiene is also important to reduce your risk of infection.  Remember - BRUSH YOUR TEETH THE MORNING OF SURGERY WITH YOUR REGULAR TOOTHPASTE  Please do not use if you have an allergy to CHG or antibacterial soaps.  If your skin becomes reddened/irritated stop using the CHG.  Do not shave (including legs and underarms) for at least 48 hours prior to first CHG shower. It is OK to shave your face.  Please follow these instructions carefully.   1. Shower the NIGHT BEFORE SURGERY and the MORNING OF SURGERY with CHG.   2. If you chose to wash your hair, wash your hair first as usual with your normal shampoo.  3. After you shampoo, rinse your hair and body thoroughly to remove the shampoo.  4. Use CHG as you would any other liquid soap. You can apply CHG directly to the skin and wash gently with a scrungie or a clean washcloth.   5. Apply the CHG Soap to your body ONLY FROM THE NECK DOWN.  Do not use on open wounds or open sores. Avoid contact with your eyes, ears, mouth and genitals (private parts). Wash Face and genitals (private parts)  with your normal soap.  6. Wash thoroughly, paying special attention to the area where your surgery will be performed.  7. Thoroughly rinse your body with warm water from the neck down.  8. DO NOT shower/wash with your normal soap after using and rinsing off the CHG Soap.  9. Pat yourself dry with a CLEAN TOWEL.  10. Wear CLEAN PAJAMAS to bed the night before surgery, wear comfortable clothes the morning of surgery  11. Place CLEAN SHEETS on your bed the night of your first shower and DO NOT SLEEP WITH PETS.  Day of Surgery:  Do not apply any deodorants/lotions.  Please wear clean clothes to the hospital/surgery center.   Remember to brush your teeth WITH YOUR REGULAR TOOTHPASTE.   Do not wear jewelry, make-up or nail polish.  Do not wear lotions, powders, or perfumes, or deodorant.  Do not shave 48 hours prior to surgery.  Men may shave face and neck.  Do not bring valuables to the hospital.  [facility] is not responsible for any belongings or valuables.  Contacts, dentures or bridgework may not be worn into surgery.  Leave your suitcase in the car.  After  surgery it may be brought to your room.  For patients admitted to the hospital, discharge time will be determined by your treatment team.  Patients discharged the day of surgery will not be allowed to drive home.   Please read over the following fact sheets that you were given.

## 2018-09-27 NOTE — Progress Notes (Addendum)
PCP: Cassandria Santee, MD  Cardiologist: Allegra Lai, MD  EKG: 07/14/18 in EPIC  Stress test: denies recent  ECHO: 02/01/18 in EPIC  Cardiac Cath: 50/1019 at [facility] in [place]-records requested  Chest x-ray: 02/05/18 in EPIC  Patient has ICD managed by Dr. Lovena Le at [facility].  ICD orders faxed. Orders returned-magnet to be placed over device during procedure, no  Post-op interrogation needed.  (These instructions are in physical chart also). Joey-[employer] rep. Phone # if needed [phone]  Patient is on Aspirin 81 mg-advised last dose 09/29/18

## 2018-09-28 ENCOUNTER — Encounter (HOSPITAL_COMMUNITY): Payer: Self-pay

## 2018-09-28 ENCOUNTER — Other Ambulatory Visit: Payer: Self-pay

## 2018-09-28 ENCOUNTER — Encounter (HOSPITAL_COMMUNITY)
Admission: RE | Admit: 2018-09-28 | Discharge: 2018-09-28 | Disposition: A | Discharge status: Home / Self Care | Payer: Medicare HMO | Source: Ambulatory Visit | Attending: General Surgery | Admitting: General Surgery

## 2018-09-28 DIAGNOSIS — R7303 Prediabetes: Secondary | ICD-10-CM | POA: Diagnosis not present

## 2018-09-28 DIAGNOSIS — Z01812 Encounter for preprocedural laboratory examination: Secondary | ICD-10-CM | POA: Insufficient documentation

## 2018-09-28 LAB — GLUCOSE, CAPILLARY: GLUCOSE-CAPILLARY: 251 mg/dL — AB (ref 70–99)

## 2018-09-28 LAB — CBC
HCT: 45 % (ref 39.0–52.0)
HEMOGLOBIN: 14.7 g/dL (ref 13.0–17.0)
MCH: 30.4 pg (ref 26.0–34.0)
MCHC: 32.7 g/dL (ref 30.0–36.0)
MCV: 93 fL (ref 80.0–100.0)
NRBC: 0 % (ref 0.0–0.2)
PLATELETS: 145 10*3/uL — AB (ref 150–400)
RBC: 4.84 MIL/uL (ref 4.22–5.81)
RDW: 13.2 % (ref 11.5–15.5)
WBC: 6 10*3/uL (ref 4.0–10.5)

## 2018-09-28 LAB — BASIC METABOLIC PANEL
ANION GAP: 6 (ref 5–15)
BUN: 23 mg/dL (ref 8–23)
CALCIUM: 9.3 mg/dL (ref 8.9–10.3)
CHLORIDE: 105 mmol/L (ref 98–111)
CO2: 24 mmol/L (ref 22–32)
Creatinine, Ser: 1.23 mg/dL (ref 0.61–1.24)
GFR calc non Af Amer: 60 mL/min — ABNORMAL LOW (ref >60)
Glucose, Bld: 240 mg/dL — ABNORMAL HIGH (ref 70–99)
POTASSIUM: 4.6 mmol/L (ref 3.5–5.1)
Sodium: 135 mmol/L (ref 135–145)

## 2018-09-28 LAB — HEMOGLOBIN A1C
HEMOGLOBIN A1C: 7.2 % — AB (ref 4.8–5.6)
Mean Plasma Glucose: 159.94 mg/dL

## 2018-09-29 NOTE — Progress Notes (Addendum)
Anesthesia Chart Review:  Case:  [id number] Date/Time:  10/05/18 1045   Procedure:  RIGHT AXILLARY LYMPH NODE DISSECTION (Right )   Anesthesia type:  General   Pre-op diagnosis:  melanoma metastatic to right axilla.   Location:  [place] OR ROOM 02 / [place] OR   Surgeon:  Stark Klein, MD      DISCUSSION: Patient is a 67 year old male scheduled for the above procedure. Patient underwent excision of right shoulder melanoma in [place] on 12/09/17. By notes, no lymph node sampling done at that time and referred for wider excision but surgery delayed due to VT storm. He underwent ICD upgrade 03/2018 at [facility] in [place]. Follow-up PET scan concerning for possible metastatic disease at right shoulder and right axillary regions. He was referred to HEM-ONC shorting after relocating back to [facility].  History includes CAD (out-of-hospital anterior MI 08/2003, medically managed in [place], prophylactic Medtronic ICD 12/2003--upgrade to CRT-D with [place] ICD 04/07/18; chronic LAD occlusion 12/2007 and 01/2018, medical therapy), ischemic cardiomyopathy, chronic systolic CHF, VT (with VT storm, s/p failed ablation ~ 2019, [place]), [employer] ICD (ICD generator change, placement of new RV lead, attempted placement of LV lead 04/07/18, Earlene Plater, MD), LV mural thrombus 02/18/09, former smoker, pre-diabetes, right shoulder melanoma (s/p excision 12/09/17, possible localized mets).  Per EP perioperative cardiac device Rx, Magnet during procedure recommended. Patient is not pacer dependent.   Attempting to get actual copies of his last cardiac cath from [facility], but results of recent cardiac testing summarized below. Based on currently available information, I would anticipate that he can proceed as planned if no acute changes.  ADDENDUM 10/03/18 5:09 PM:  I have received additional records from [facility] and Dr. Lavonna Monarch that included 04/07/18 ICD generator change procedure report,  01/30/18 LHC, 01/27/18 radiofrequency ablation of the LV for VT procedure report, and 07/14/16 ETT. Reports outlined below (CV section). Additional records on patient's hard chart for review as needed.    VS: BP 113/68   Pulse 65   Temp 36.7 C (Oral)   Resp 18   Ht 5\' 11"  (1.803 m)   Wt 84.3 kg   SpO2 98%   BMI 25.93 kg/m    PROVIDERS: [name], MD is PCP. Established care 08/07/18. Cristopher Peru, MD is EP cardiologist (re-established with [facility] 02/2018, but returned to [facility] for ICD upgrade sine he was able to get financial assistance through in former cardiologist Earlene Plater, MD in [place], [place]). Last seen by Dr. Lovena Le on 07/14/18.   Zola Button, MD is HEM-ONC. Established care 08/10/18.    LABS: Labs reviewed: Acceptable for surgery. (all labs ordered are listed, but only abnormal results are displayed)  Labs Reviewed  GLUCOSE, CAPILLARY - Abnormal; Notable for the following components:      Result Value   Glucose-Capillary 251 (*)    All other components within normal limits  BASIC METABOLIC PANEL - Abnormal; Notable for the following components:   Glucose, Bld 240 (*)    GFR calc non Af Amer 60 (*)    All other components within normal limits  CBC - Abnormal; Notable for the following components:   Platelets 145 (*)    All other components within normal limits  HEMOGLOBIN A1C - Abnormal; Notable for the following components:   Hgb A1c MFr Bld 7.2 (*)    All other components within normal limits    IMAGES: CXR 04/08/18 [facility], scanned under Media tab): Findings:  The bones, lungs, left-sided pleura, mediastinum and soft tissues are unremarkable.  There is a small right pleural effusion.  The heart is enlarged.  Stable position of defibrillator device projecting over the left axilla with no acute left pneumothorax.   Impression: Defibrillator device in stable position with no new pneumothorax. Cardiomegaly. Small right pleural effusion.    RUE venous Duplex 01/30/18 [facility], scanned under Media tab) :  Impression: 1.  Nearly occlusive thrombus in the right radial and ulnar veins which are deep veins. 2.  Superficial vein thrombosis in the right cephalic and basilic veins. 3.  There is good compressibility and color flow noted in the right internal jugular, subclavian, axillary, and brachial veins.  LUE venous Duplex 01/30/18 [facility], scanned under Media tab) : Impression:  1. No sonographic evidence of DVT. 2. Occlusive thrombus in the left cephalic vein which is a superficial vein.  RUQ abdominal U/S 01/30/18 [facility], scanned under Media tab) Impression:  Enlarged echogenic liver, consistent with fatty infiltration versus hepatocellular disease. Gallbladder sludge.   EKG: 07/14/18: NSR, right atrial enlargement, rightward axis, left BBB.   CV: According to Progress Note/H&P from [facility] dated 04/16/18 (for ICD upgrade): - "Stress nuclear testing has shown an ejection fraction of 35% with evidence of previous anterior infarction." - "He underwent diagnostic catheterization which showed an occluded LAD but the remaining vessels were intact.  He has severely depressed ejection fraction with an apical scar." - "He underwent ablation therapy and had a return of his ventricular tachycardia the next day.Marland KitchenMarland KitchenMarland KitchenPost procedure he developed a left bundle branch block pattern." Will attempt to get copies from [place] 10/03/18 5:09 PM:  Most recent cardiac studies outlined below:  EP Procedure 04/07/18 [facility]): ICD generator change, placement of new RV lead, attempted placement of LV lead.  Specific device information: The generator is a [employer] CRT-D, model Q8534115, serial [id number].  The new RV lead is a Scientist, research (medical) 4-Front active fixated dual coil [id number], serial [id number].  Echo 02/01/18 [facility], scanned under Media  tab): Summary: Left ventricular systolic function is severely reduced.  Ejection fraction 25 to 35%.  There is apical akinesis.  Moderate sized pericardial effusion without indications of cardiac tamponade. Compared to prior study, the effusion is smaller.   LHC 01/30/18 [facility]): Findings: 1.  Left main: This was a large vessel, which bifurcated into the LAD and left circumflex.  There was no high-grade stenosis seen on angiography. 2.  Left circumflex: This was a moderate-sized vessel, which transverse through the AV groove as a moderate-sized vessel and then discontinued.  There was no high-grade stenosis seen on angiography. 3.  Left anterior descending artery: This artery was completely occluded at the ostium. 4.  Right coronary artery: This was a large vessel, which was dominant and gave rise to both PDA and the PLB.  There was no high-grade stenosis seen on angiography. Recommendations: 1.  Occluded left anterior descending artery 2.  Patent left circumflex and RCA 3.  Normal LVEDP at 14 mmHg 4.  Continue aggressive medical management  EP Procedure 01/27/18 [facility]): 1.  Comprehensive electrophysiology study with coronary sinus pacing and recording. 2.  Three-dimensional electroanatomic mapping of the left ventricle 3.  Intracardiac echocardiography. 4.  Radiofrequency ablation of the left ventricle for ventricular tachycardia  ETT 07/14/16 [facility]): Impression: 1. No chest pain with exercise. 2.  No ischemic EKG changes. 3.  Normal heart rate and blood pressure  response to exercise. 4.  No evidence of exercise-induced arrhythmias. Comments: Negative stress test for inducible ischemia or chest pain.   Past Medical History:  Diagnosis Date  . AICD (automatic cardioverter/defibrillator) present    [employer]  . Anxiety   . CAD (coronary artery disease)   . CAD, NATIVE VESSEL 02/18/2009   Qualifier: Diagnosis of  By: Stevie Kern NP-C,  Sharyn Lull    . Encounter for long-term (current) use of high-risk medication   . Heart failure, systolic, acute on chronic ([place])   . ICD (implantable cardiac defibrillator), single, in situ   . ICD - IN SITU 01/02/2009   Qualifier: Diagnosis of  By: Peri Maris    . ISCHEMIC CARDIOMYOPATHY 03/19/2009   Qualifier: Diagnosis of  By: Boyce Medici RN, BSN, [place]    . LEFT VENTRICULAR MURAL THROMBUS 02/18/2009   Qualifier: Diagnosis of  By: Stevie Kern NP-C, Sharyn Lull    . Long term current use of aspirin   . Melanoma ([facility]) 2019  . MI (myocardial infarction) ([place]) 08/2003  . PAROXYSMAL VENTRICULAR TACHYCARDIA 03/19/2009   Qualifier: Diagnosis of  By: Boyce Medici, RN, BSN, [place]    . Pre-diabetes   . SYSTOLIC HEART FAILURE, ACUTE ON CHRONIC 01/02/2009   Qualifier: Diagnosis of  By: Stevie Kern NP-C, Sharyn Lull    . TACHYCARDIA 01/02/2009   Qualifier: Diagnosis of  By: Peri Maris    . Ventricular tachycardia [facility])     Past Surgical History:  Procedure Laterality Date  . CARDIAC CATHETERIZATION    . INSERT / REPLACE / REMOVE PACEMAKER    . SKIN BIOPSY  2019   melanoma R shoulder  . TONSILLECTOMY AND ADENOIDECTOMY  1969    MEDICATIONS: . acyclovir (ZOVIRAX) 800 MG tablet  . amiodarone (PACERONE) 200 MG tablet  . aspirin 81 MG tablet  . carvedilol (COREG) 6.25 MG tablet  . ipratropium (ATROVENT) 0.06 % nasal spray  . Light Mineral Oil-Mineral Oil (RETAINE MGD) 0.5-0.5 % EMUL  . metFORMIN (GLUCOPHAGE) 500 MG tablet  . mexiletine (MEXITIL) 150 MG capsule  . rosuvastatin (CRESTOR) 10 MG tablet  . sacubitril-valsartan (ENTRESTO) 24-26 MG   No current facility-administered medications for this encounter.   Last dose ASA 09/29/18.   George Hugh [facility] Short Stay Center/Anesthesiology Phone [phone] 09/29/2018 4:43 PM

## 2018-09-29 NOTE — Anesthesia Preprocedure Evaluation (Addendum)
Anesthesia Evaluation  Patient identified by MRN, date of birth, ID band Patient awake    Reviewed: Allergy & Precautions, NPO status , Patient's Chart, lab work & pertinent test results  Airway Mallampati: II   Neck ROM: full    Dental  (+) Teeth Intact, Dental Advidsory Given   Pulmonary former smoker,    breath sounds clear to auscultation       Cardiovascular + CAD and + Past MI  + dysrhythmias Ventricular Tachycardia + Cardiac Defibrillator  Rhythm:regular Rate:Normal  Echo 02/01/18: Left ventricular systolic function is severely reduced.  Ejection fraction 25 to 35%.  There is apical akinesis.  Moderate sized pericardial effusion without indications of cardiac tamponade. Compared to prior study, the effusion is smaller.   LHC 01/30/18: 1.  Occluded left anterior descending artery 2.  Patent left circumflex and RCA 3.  Normal LVEDP at 14 mmHg 4.  Continue aggressive medical management    Neuro/Psych    GI/Hepatic   Endo/Other    Renal/GU      Musculoskeletal   Abdominal   Peds  Hematology   Anesthesia Other Findings   Reproductive/Obstetrics                         Anesthesia Physical Anesthesia Plan  ASA: III  Anesthesia Plan: General   Post-op Pain Management:    Induction: Intravenous  PONV Risk Score and Plan: 2 and Ondansetron, Dexamethasone and Midazolam  Airway Management Planned: LMA  Additional Equipment:   Intra-op Plan:   Post-operative Plan:   Informed Consent: I have reviewed the patients History and Physical, chart, labs and discussed the procedure including the risks, benefits and alternatives for the proposed anesthesia with the patient or authorized representative who has indicated his/her understanding and acceptance.   Dental Advisory Given  Plan Discussed with: CRNA, Anesthesiologist and Surgeon  Anesthesia Plan Comments: (PAT note written 09/29/2018  by Myra Gianotti, PA-C. [employer] ICD. )     Anesthesia Quick Evaluation

## 2018-10-02 ENCOUNTER — Telehealth: Payer: Self-pay | Admitting: *Deleted

## 2018-10-02 NOTE — Telephone Encounter (Signed)
   [facility] Pre-operative Risk Assessment    Request for surgical clearance:  1. What type of surgery is being performed? RIGHT AXILLARY  LYMPH NODE DISSECTION  2. When is this surgery scheduled?  10/05/2018  3. What type of clearance is required (medical clearance vs. Pharmacy clearance to hold med vs. Both)? CARDIAC [employer] ICD  4. Are there any medications that need to be held prior to surgery and how long? N/A  5. Practice name and name of physician performing surgery? [facility]  6. What is your office phone number [phone]   7.   What is your office fax number [phone]  8.   Anesthesia type (None, local, MAC, general) ?  GENERAL   Devra Dopp 10/02/2018, 11:04 AM  _________________________________________________________________   (provider comments below)

## 2018-10-02 NOTE — H&P (Signed)
Lee Hall Documented: 09/18/2018 9:24 AM Location: [facility] Patient #: [id number] DOB: 07/24/1955 Single / Language: Lee Hall / Race: White Male   History of Present Illness Stark Klein MD; 09/18/2018 5:55 PM) The patient is a 67 year old male who presents with malignant melanoma. Pt is a 67 yo M who is referred for consultation at the request of Dr. Alen Blew for a diagnosis of melanoma of the right shoulder. This was diagnosed in December 2018. He had a "large freckle" in this location that started to change rapidly. He got this excised by dermatology in [place]. They were planning on doing more surgery, but he developed significant v tach and his AICD started firing over and over. He spent 16 days in the hospital and he had to get this switched out due to the battery life and age being at the end of efficacy. He also had his meds [place]. [place] so he waiting on that to get settled. He had previously seen Dr. Cristopher Peru prior to move to [facility], so he reestablished cardiology care with him. This led to referral to cancer center and now to me.   His biopsy was a ulcerated nodular melanoma wtih depth of 4 mm. No pathologic ulceration was seen, but clinical ulceration was. There were 6 mit/mm2. No LVI, TIL, neurotropism, or microsatellites were seen. deep margin was negative. Closest peripheral margin was 2.5 mm.  This was from [facility] in [place]. Drs. were Dr. Epimenio Foot and Dr. Ayesha Mohair. Spec number [id number], phone number [phone].  PET 09/04/2018 IMPRESSION: 1. Right axillary nodal metastasis. 2. L1 focus of osseous hypermetabolism is without CT correlate. Consider dedicated pre and post-contrast lumbar spine MRI. 3. Hypermetabolic healing eleventh left rib fracture is presumably posttraumatic. Correlate with prior trauma. If no prior trauma, isolated osseous metastasis with  pathologic fracture would be a concern. 4. Superficial hypermetabolism about the left lateral mid foot is indeterminate but warrants physical exam correlation.   Past Surgical History Sabino Gasser, CMA; 09/18/2018 9:32 AM) Cataract Surgery  Left. Oral Surgery  Tonsillectomy   Diagnostic Studies History Sabino Gasser, CMA; 09/18/2018 9:32 AM) Colonoscopy  never  Allergies Sabino Gasser, CMA; 09/18/2018 9:32 AM) No Known Drug Allergies [09/18/2018]: Allergies Reconciled   Medication History Sabino Gasser, CMA; 09/18/2018 9:32 AM) Acyclovir (800MG  Tablet, Oral) Active. Carvedilol (6.25MG  Tablet, Oral) Active. Entresto (24-26MG  Tablet, Oral) Active. Ipratropium Bromide (0.06% Solution, Nasal) Active. metFORMIN HCl ER (500MG  Tablet ER 24HR, Oral) Active. Mexiletine HCl (150MG  Capsule, Oral) Active. Rosuvastatin Calcium (10MG  Tablet, Oral) Active. Medications Reconciled  Social History Sabino Gasser, [place]; 09/18/2018 9:32 AM) Alcohol use  Recently quit alcohol use. No caffeine use  No drug use  Tobacco use  Former smoker.  Family History Sabino Gasser, [place]; 09/18/2018 9:32 AM) Alcohol Abuse  Father. Diabetes Mellitus  Father. Hypertension  Father. Thyroid problems  Mother.  Other Problems Sabino Gasser, CMA; 09/18/2018 9:32 AM) Anxiety Disorder  Congestive Heart Failure  Diabetes Mellitus  High blood pressure  Melanoma  Myocardial infarction     Review of Systems Stark Klein MD; 09/18/2018 5:52 PM) General Not Present- Appetite Loss, Chills, Fatigue, Fever, Night Sweats, Weight Gain and Weight Loss. Skin Not Present- Change in Wart/Mole, Dryness, Hives, Jaundice, New Lesions, Non-Healing Wounds, Rash and Ulcer. HEENT Present- Ringing in the Ears, Seasonal Allergies and Wears glasses/contact lenses. Not Present- Earache, Hearing Loss, Hoarseness, Nose Bleed, Oral Ulcers, Sinus Pain, Sore Throat, Visual Disturbances and  Yellow  Eyes. Respiratory Not Present- Bloody sputum, Chronic Cough, Difficulty Breathing, Snoring and Wheezing. Breast Not Present- Breast Mass, Breast Pain, Nipple Discharge and Skin Changes. Gastrointestinal Not Present- Abdominal Pain, Bloating, Bloody Stool, Change in Bowel Habits, Chronic diarrhea, Constipation, Difficulty Swallowing, Excessive gas, Gets full quickly at meals, Hemorrhoids, Indigestion, Nausea, Rectal Pain and Vomiting. Male Genitourinary Present- Frequency and Urgency. Not Present- Blood in Urine, Change in Urinary Stream, Impotence, Nocturia, Painful Urination and Urine Leakage. Musculoskeletal Not Present- Back Pain, Joint Pain, Joint Stiffness, Muscle Pain, Muscle Weakness and Swelling of Extremities. Neurological Not Present- Decreased Memory, Fainting, Headaches, Numbness, Seizures, Tingling, Tremor, Trouble walking and Weakness. Psychiatric Present- Anxiety. Not Present- Bipolar, Change in Sleep Pattern, Depression, Fearful and Frequent crying. Endocrine Present- New Diabetes. Not Present- Cold Intolerance, Excessive Hunger, Hair Changes, Heat Intolerance and Hot flashes. Hematology Present- Easy Bruising. Not Present- Blood Thinners, Excessive bleeding, Gland problems, HIV and Persistent Infections. All other systems negative  Vitals Sabino Gasser CMA; 09/18/2018 9:33 AM) 09/18/2018 9:33 AM Weight: 188.5 lb Height: 71in Body Surface Area: 2.06 m Body Mass Index: 26.29 kg/m  Temp.: 98.31F(Oral)  Pulse: 67 (Regular)  BP: 130/82 (Sitting, Left Arm, Standard)       Physical Exam Stark Klein MD; 09/18/2018 5:53 PM) General Mental Status-Alert. General Appearance-Consistent with stated age. Hydration-Well hydrated. Voice-Normal.  Integumentary Note: scar is transversely oriented to the clavicle and acromion. There is some stretching of the scar.   Head and Neck Head-normocephalic, atraumatic with no lesions or palpable  masses. Trachea-midline. Thyroid Gland Characteristics - normal size and consistency.  Eye Eyeball - Bilateral-Extraocular movements intact. Sclera/Conjunctiva - Bilateral-No scleral icterus.  Chest and Lung Exam Chest and lung exam reveals -quiet, even and easy respiratory effort with no use of accessory muscles and on auscultation, normal breath sounds, no adventitious sounds and normal vocal resonance. Inspection Chest Wall - Normal. Back - normal.  Cardiovascular Cardiovascular examination reveals -normal heart sounds, regular rate and rhythm with no murmurs and normal pedal pulses bilaterally.  Abdomen Inspection Inspection of the abdomen reveals - No Hernias. Palpation/Percussion Palpation and Percussion of the abdomen reveal - Soft, Non Tender, No Rebound tenderness, No Rigidity (guarding) and No hepatosplenomegaly. Auscultation Auscultation of the abdomen reveals - Bowel sounds normal.  Neurologic Neurologic evaluation reveals -alert and oriented x 3 with no impairment of recent or remote memory. Mental Status-Normal.  Musculoskeletal Global Assessment -Note: no gross deformities.  Normal Exam - Left-Upper Extremity Strength Normal and Lower Extremity Strength Normal. Normal Exam - Right-Upper Extremity Strength Normal and Lower Extremity Strength Normal.  Lymphatic Head & Neck  General Head & Neck Lymphatics: Bilateral - Description - Normal. Axillary  General Axillary Region: Bilateral - Description - Localized lymphadenopathy(at least 2 palpable LN in right axilla). Tenderness - Non Tender. Femoral & Inguinal  Generalized Femoral & Inguinal Lymphatics: Bilateral - Description - No Generalized lymphadenopathy.    Assessment & Plan Stark Klein MD; 09/18/2018 5:57 PM) MALIGNANT MELANOMA METASTATIC TO LYMPH NODE (C43.9) Impression: I will review path to see if WLE is needed. This is a very tight area. He will need ALND.  The area of  possible concern in the L spine is going to be addressed with a CT of the lumbar spine. He cannot get an MR due to AICD. He notes that he has been to the chiropractor for 6 weeks prior to PET and thinks that is why the spine lit up.  If the spine is positive, will just place port  and start wtih chemo. Otherwise, would make patient NED with ALND and then get adjuvant immunotherapy.  Discussed risks with patient, recovery, time off work, risk of numbness, bleeding, cancer recurrence. Reviewed drain, overnight stay. Pt wishes to proceed. Current Plans You are being scheduled for surgery- Our schedulers will call you.  You should hear from our office's scheduling department within 5 working days about the location, date, and time of surgery. We try to make accommodations for patient's preferences in scheduling surgery, but sometimes the OR schedule or the surgeon's schedule prevents [place] from making those accommodations.  If you have not heard from our office [phone]) in 5 working days, call the office and ask for your surgeon's nurse.  If you have other questions about your diagnosis, plan, or surgery, call the office and ask for your surgeon's nurse.    Signed by Stark Klein, MD (09/18/2018 5:58 PM)

## 2018-10-03 ENCOUNTER — Encounter (HOSPITAL_COMMUNITY): Payer: Self-pay

## 2018-10-04 NOTE — Telephone Encounter (Signed)
   Primary Cardiologist: Will Meredith Leeds, MD  Chart reviewed as part of pre-operative protocol coverage. Very complex PMH with CAD, VT, chronic systolic CHF. Last EF scanned 25-35% in 01/2018.  I reached out to the patient but he did not answer, so LMOM. He did call back and affirmed he is feeling his version of "normal." He states he is actually feeling somewhat better than when he saw Dr. Lovena Le recently. Revised cardiac risk index is 6.6%. I reviewed with Dr. Curt Bears since surgery is tomorrow - given comorbidities he is at least moderate if not high risk for intended procedure but Dr. Curt Bears does not feel any acute cardiac testing is required at this time. The patient understands his cardiac risk.  Device clinic indicates they sent over the device clearance on 09/27/18. This is referenced in pre-op admissions note so it appears they did receive it. The patient indicates his surgeon had already advised him to begin holding aspirin this past Friday. We would suggest to resume as soon as possibly felt safe by the surgical team.  I will route this recommendation to the requesting party via Epic fax function and remove from pre-op pool.  Please call with questions.  Charlie Pitter, PA-C 10/04/2018, 2:46 PM

## 2018-10-05 ENCOUNTER — Ambulatory Visit (HOSPITAL_COMMUNITY): Payer: Medicare HMO | Admitting: Vascular Surgery

## 2018-10-05 ENCOUNTER — Ambulatory Visit (HOSPITAL_COMMUNITY): Payer: Medicare HMO | Admitting: Anesthesiology

## 2018-10-05 ENCOUNTER — Other Ambulatory Visit: Payer: Self-pay

## 2018-10-05 ENCOUNTER — Observation Stay (HOSPITAL_COMMUNITY)
Admission: RE | Admit: 2018-10-05 | Discharge: 2018-10-06 | Disposition: A | Discharge status: Home / Self Care | Payer: Medicare HMO | Source: Ambulatory Visit | Attending: General Surgery | Admitting: General Surgery

## 2018-10-05 ENCOUNTER — Encounter (HOSPITAL_COMMUNITY)
Admission: RE | Disposition: A | Discharge status: Home / Self Care | Payer: Self-pay | Source: Ambulatory Visit | Attending: General Surgery

## 2018-10-05 ENCOUNTER — Encounter (HOSPITAL_COMMUNITY): Payer: Self-pay

## 2018-10-05 DIAGNOSIS — Z87891 Personal history of nicotine dependence: Secondary | ICD-10-CM | POA: Diagnosis not present

## 2018-10-05 DIAGNOSIS — E119 Type 2 diabetes mellitus without complications: Secondary | ICD-10-CM | POA: Insufficient documentation

## 2018-10-05 DIAGNOSIS — Z79899 Other long term (current) drug therapy: Secondary | ICD-10-CM | POA: Insufficient documentation

## 2018-10-05 DIAGNOSIS — C773 Secondary and unspecified malignant neoplasm of axilla and upper limb lymph nodes: Secondary | ICD-10-CM | POA: Diagnosis not present

## 2018-10-05 DIAGNOSIS — Z7984 Long term (current) use of oral hypoglycemic drugs: Secondary | ICD-10-CM | POA: Diagnosis not present

## 2018-10-05 DIAGNOSIS — I2510 Atherosclerotic heart disease of native coronary artery without angina pectoris: Secondary | ICD-10-CM | POA: Diagnosis not present

## 2018-10-05 DIAGNOSIS — Z95810 Presence of automatic (implantable) cardiac defibrillator: Secondary | ICD-10-CM | POA: Insufficient documentation

## 2018-10-05 DIAGNOSIS — C439 Malignant melanoma of skin, unspecified: Secondary | ICD-10-CM | POA: Diagnosis not present

## 2018-10-05 DIAGNOSIS — I472 Ventricular tachycardia: Secondary | ICD-10-CM | POA: Diagnosis not present

## 2018-10-05 DIAGNOSIS — I5022 Chronic systolic (congestive) heart failure: Secondary | ICD-10-CM | POA: Diagnosis not present

## 2018-10-05 DIAGNOSIS — I219 Acute myocardial infarction, unspecified: Secondary | ICD-10-CM | POA: Diagnosis not present

## 2018-10-05 DIAGNOSIS — C779 Secondary and unspecified malignant neoplasm of lymph node, unspecified: Secondary | ICD-10-CM

## 2018-10-05 HISTORY — PX: AXILLARY LYMPH NODE DISSECTION: SHX5229

## 2018-10-05 LAB — GLUCOSE, CAPILLARY
GLUCOSE-CAPILLARY: 176 mg/dL — AB (ref 70–99)
GLUCOSE-CAPILLARY: 178 mg/dL — AB (ref 70–99)

## 2018-10-05 LAB — MAGNESIUM: Magnesium: 1.8 mg/dL (ref 1.7–2.4)

## 2018-10-05 SURGERY — LYMPHADENECTOMY, AXILLARY
Anesthesia: General | Site: Axilla | Laterality: Right

## 2018-10-05 MED ORDER — AMIODARONE HCL 200 MG PO TABS
200.0000 mg | ORAL_TABLET | Freq: Every day | ORAL | Status: DC
  Administered 2018-10-06: 200 mg via ORAL
  Filled 2018-10-05: qty 1

## 2018-10-05 MED ORDER — SIMETHICONE 80 MG PO CHEW: 40.0000 mg | CHEWABLE_TABLET | Freq: Four times a day (QID) | ORAL | Status: DC | PRN

## 2018-10-05 MED ORDER — DIPHENHYDRAMINE HCL 50 MG/ML IJ SOLN: 12.5000 mg | Freq: Four times a day (QID) | INTRAMUSCULAR | Status: DC | PRN

## 2018-10-05 MED ORDER — ROSUVASTATIN CALCIUM 10 MG PO TABS
10.0000 mg | ORAL_TABLET | Freq: Every evening | ORAL | Status: DC
  Administered 2018-10-05: 10 mg via ORAL
  Filled 2018-10-05 (×2): qty 1

## 2018-10-05 MED ORDER — EPHEDRINE SULFATE-NACL 50-0.9 MG/10ML-% IV SOSY
PREFILLED_SYRINGE | INTRAVENOUS | Status: DC | PRN
  Administered 2018-10-05 (×4): 10 mg via INTRAVENOUS
  Administered 2018-10-05: 5 mg via INTRAVENOUS

## 2018-10-05 MED ORDER — ONDANSETRON HCL 4 MG/2ML IJ SOLN: 4.0000 mg | Freq: Four times a day (QID) | INTRAMUSCULAR | Status: DC | PRN

## 2018-10-05 MED ORDER — ACETAMINOPHEN 325 MG PO TABS
650.0000 mg | ORAL_TABLET | Freq: Four times a day (QID) | ORAL | Status: DC | PRN
  Administered 2018-10-05: 650 mg via ORAL
  Filled 2018-10-05: qty 2

## 2018-10-05 MED ORDER — OXYCODONE HCL 5 MG PO TABS: 5.0000 mg | ORAL_TABLET | Freq: Once | ORAL | Status: DC | PRN

## 2018-10-05 MED ORDER — BUPIVACAINE-EPINEPHRINE (PF) 0.25% -1:200000 IJ SOLN
INTRAMUSCULAR | Status: AC
  Filled 2018-10-05: qty 30

## 2018-10-05 MED ORDER — PHENYLEPHRINE HCL 10 MG/ML IJ SOLN
INTRAMUSCULAR | Status: DC | PRN
  Administered 2018-10-05: 80 ug via INTRAVENOUS
  Administered 2018-10-05: 40 ug via INTRAVENOUS

## 2018-10-05 MED ORDER — CHLORHEXIDINE GLUCONATE CLOTH 2 % EX PADS: 6.0000 | MEDICATED_PAD | Freq: Once | CUTANEOUS | Status: DC

## 2018-10-05 MED ORDER — FENTANYL CITRATE (PF) 250 MCG/5ML IJ SOLN
INTRAMUSCULAR | Status: AC
  Filled 2018-10-05: qty 5

## 2018-10-05 MED ORDER — IPRATROPIUM BROMIDE 0.06 % NA SOLN
2.0000 | Freq: Three times a day (TID) | NASAL | Status: DC
  Administered 2018-10-05 – 2018-10-06 (×3): 2 via NASAL
  Filled 2018-10-05 (×2): qty 15

## 2018-10-05 MED ORDER — PROPOFOL 10 MG/ML IV BOLUS
INTRAVENOUS | Status: DC | PRN
  Administered 2018-10-05: 100 mg via INTRAVENOUS

## 2018-10-05 MED ORDER — SENNA 8.6 MG PO TABS
1.0000 | ORAL_TABLET | Freq: Two times a day (BID) | ORAL | Status: DC
  Administered 2018-10-05 – 2018-10-06 (×3): 8.6 mg via ORAL
  Filled 2018-10-05 (×3): qty 1

## 2018-10-05 MED ORDER — LIDOCAINE 2% (20 MG/ML) 5 ML SYRINGE
INTRAMUSCULAR | Status: DC | PRN
  Administered 2018-10-05: 100 mg via INTRAVENOUS

## 2018-10-05 MED ORDER — METFORMIN HCL 500 MG PO TABS
1000.0000 mg | ORAL_TABLET | Freq: Every day | ORAL | Status: DC
  Administered 2018-10-05: 500 mg via ORAL
  Filled 2018-10-05 (×2): qty 2

## 2018-10-05 MED ORDER — ACETAMINOPHEN 500 MG PO TABS
1000.0000 mg | ORAL_TABLET | ORAL | Status: AC
  Administered 2018-10-05: 1000 mg via ORAL

## 2018-10-05 MED ORDER — OXYCODONE HCL 5 MG PO TABS
5.0000 mg | ORAL_TABLET | ORAL | Status: DC | PRN
  Administered 2018-10-05 – 2018-10-06 (×3): 5 mg via ORAL
  Filled 2018-10-05 (×3): qty 1

## 2018-10-05 MED ORDER — ONDANSETRON HCL 4 MG/2ML IJ SOLN
INTRAMUSCULAR | Status: DC | PRN
  Administered 2018-10-05: 4 mg via INTRAVENOUS

## 2018-10-05 MED ORDER — ACYCLOVIR 400 MG PO TABS
400.0000 mg | ORAL_TABLET | ORAL | Status: DC
  Administered 2018-10-05: 400 mg via ORAL
  Filled 2018-10-05: qty 1

## 2018-10-05 MED ORDER — 0.9 % SODIUM CHLORIDE (POUR BTL) OPTIME
TOPICAL | Status: DC | PRN
  Administered 2018-10-05: 1000 mL

## 2018-10-05 MED ORDER — CARVEDILOL 6.25 MG PO TABS
6.2500 mg | ORAL_TABLET | Freq: Two times a day (BID) | ORAL | Status: DC
  Administered 2018-10-05 – 2018-10-06 (×2): 6.25 mg via ORAL
  Filled 2018-10-05 (×3): qty 1

## 2018-10-05 MED ORDER — LACTATED RINGERS IV SOLN
INTRAVENOUS | Status: DC | PRN
  Administered 2018-10-05 (×2): via INTRAVENOUS

## 2018-10-05 MED ORDER — GABAPENTIN 300 MG PO CAPS
ORAL_CAPSULE | ORAL | Status: AC
  Filled 2018-10-05: qty 1

## 2018-10-05 MED ORDER — STERILE WATER FOR IRRIGATION IR SOLN
Status: DC | PRN
  Administered 2018-10-05: 1000 mL

## 2018-10-05 MED ORDER — ONDANSETRON 4 MG PO TBDP: 4.0000 mg | ORAL_TABLET | Freq: Four times a day (QID) | ORAL | Status: DC | PRN

## 2018-10-05 MED ORDER — FENTANYL CITRATE (PF) 100 MCG/2ML IJ SOLN
INTRAMUSCULAR | Status: DC | PRN
  Administered 2018-10-05: 100 ug via INTRAVENOUS

## 2018-10-05 MED ORDER — LACTATED RINGERS IV SOLN
INTRAVENOUS | Status: DC
  Administered 2018-10-05: 10:00:00 via INTRAVENOUS

## 2018-10-05 MED ORDER — MEXILETINE HCL 150 MG PO CAPS
150.0000 mg | ORAL_CAPSULE | Freq: Three times a day (TID) | ORAL | Status: DC
  Administered 2018-10-05 – 2018-10-06 (×3): 150 mg via ORAL
  Filled 2018-10-05 (×4): qty 1

## 2018-10-05 MED ORDER — CEFAZOLIN SODIUM-DEXTROSE 2-4 GM/100ML-% IV SOLN
2.0000 g | Freq: Three times a day (TID) | INTRAVENOUS | Status: AC
  Administered 2018-10-05: 2 g via INTRAVENOUS
  Filled 2018-10-05: qty 100

## 2018-10-05 MED ORDER — LIDOCAINE HCL 1 % IJ SOLN
INTRAMUSCULAR | Status: DC | PRN
  Administered 2018-10-05: 40 mL

## 2018-10-05 MED ORDER — SACUBITRIL-VALSARTAN 24-26 MG PO TABS
1.0000 | ORAL_TABLET | Freq: Two times a day (BID) | ORAL | Status: DC
  Administered 2018-10-05 – 2018-10-06 (×2): 1 via ORAL
  Filled 2018-10-05 (×2): qty 1

## 2018-10-05 MED ORDER — PROPOFOL 10 MG/ML IV BOLUS
INTRAVENOUS | Status: AC
  Filled 2018-10-05: qty 20

## 2018-10-05 MED ORDER — METHOCARBAMOL 500 MG PO TABS: 500.0000 mg | ORAL_TABLET | Freq: Four times a day (QID) | ORAL | Status: DC | PRN

## 2018-10-05 MED ORDER — CEFAZOLIN SODIUM-DEXTROSE 2-4 GM/100ML-% IV SOLN
INTRAVENOUS | Status: AC
  Filled 2018-10-05: qty 100

## 2018-10-05 MED ORDER — MIDAZOLAM HCL 5 MG/5ML IJ SOLN
INTRAMUSCULAR | Status: DC | PRN
  Administered 2018-10-05: 2 mg via INTRAVENOUS

## 2018-10-05 MED ORDER — FENTANYL CITRATE (PF) 100 MCG/2ML IJ SOLN: 25.0000 ug | INTRAMUSCULAR | Status: DC | PRN

## 2018-10-05 MED ORDER — OXYCODONE HCL 5 MG/5ML PO SOLN: 5.0000 mg | Freq: Once | ORAL | Status: DC | PRN

## 2018-10-05 MED ORDER — POLYETHYLENE GLYCOL 3350 17 G PO PACK: 17.0000 g | PACK | Freq: Every day | ORAL | Status: DC | PRN

## 2018-10-05 MED ORDER — ACETAMINOPHEN 650 MG RE SUPP: 650.0000 mg | Freq: Four times a day (QID) | RECTAL | Status: DC | PRN

## 2018-10-05 MED ORDER — CEFAZOLIN SODIUM-DEXTROSE 2-4 GM/100ML-% IV SOLN
2.0000 g | INTRAVENOUS | Status: AC
  Administered 2018-10-05: 2 g via INTRAVENOUS

## 2018-10-05 MED ORDER — MIDAZOLAM HCL 2 MG/2ML IJ SOLN
INTRAMUSCULAR | Status: AC
  Filled 2018-10-05: qty 2

## 2018-10-05 MED ORDER — DIPHENHYDRAMINE HCL 12.5 MG/5ML PO ELIX: 12.5000 mg | ORAL_SOLUTION | Freq: Four times a day (QID) | ORAL | Status: DC | PRN

## 2018-10-05 MED ORDER — ACETAMINOPHEN 500 MG PO TABS
ORAL_TABLET | ORAL | Status: AC
  Filled 2018-10-05: qty 2

## 2018-10-05 MED ORDER — GABAPENTIN 300 MG PO CAPS
300.0000 mg | ORAL_CAPSULE | Freq: Two times a day (BID) | ORAL | Status: DC
  Administered 2018-10-05 – 2018-10-06 (×3): 300 mg via ORAL
  Filled 2018-10-05 (×3): qty 1

## 2018-10-05 MED ORDER — GABAPENTIN 300 MG PO CAPS
300.0000 mg | ORAL_CAPSULE | ORAL | Status: AC
  Administered 2018-10-05: 300 mg via ORAL

## 2018-10-05 MED ORDER — DEXAMETHASONE SODIUM PHOSPHATE 10 MG/ML IJ SOLN
INTRAMUSCULAR | Status: DC | PRN
  Administered 2018-10-05: 10 mg via INTRAVENOUS

## 2018-10-05 MED ORDER — TRAMADOL HCL 50 MG PO TABS: 50.0000 mg | ORAL_TABLET | Freq: Four times a day (QID) | ORAL | Status: DC | PRN

## 2018-10-05 MED ORDER — LIGHT MINERAL OIL-MINERAL OIL 0.5-0.5 % OP EMUL: 1.0000 [drp] | Freq: Four times a day (QID) | OPHTHALMIC | Status: DC | PRN

## 2018-10-05 MED ORDER — KCL IN DEXTROSE-NACL 20-5-0.45 MEQ/L-%-% IV SOLN
INTRAVENOUS | Status: DC
  Administered 2018-10-05 – 2018-10-06 (×2): via INTRAVENOUS
  Filled 2018-10-05 (×2): qty 1000

## 2018-10-05 MED ORDER — LIDOCAINE HCL 1 % IJ SOLN
INTRAMUSCULAR | Status: AC
  Filled 2018-10-05: qty 20

## 2018-10-05 SURGICAL SUPPLY — 57 items
ADH SKN CLS APL DERMABOND .7 (GAUZE/BANDAGES/DRESSINGS) ×1
BNDG COHESIVE 6X5 TAN STRL LF (GAUZE/BANDAGES/DRESSINGS) ×1 IMPLANT
CANISTER SUCT 3000ML PPV (MISCELLANEOUS) IMPLANT
CHLORAPREP W/TINT 26ML (MISCELLANEOUS) ×3 IMPLANT
CLIP VESOCCLUDE MED 6/CT (CLIP) ×9 IMPLANT
CLIP VESOCCLUDE SM WIDE 6/CT (CLIP) ×2 IMPLANT
CLOSURE WOUND 1/2 X4 (GAUZE/BANDAGES/DRESSINGS) ×1
CONT SPEC 4OZ CLIKSEAL STRL BL (MISCELLANEOUS) ×2 IMPLANT
COVER SURGICAL LIGHT HANDLE (MISCELLANEOUS) ×3 IMPLANT
COVER WAND RF STERILE (DRAPES) ×3 IMPLANT
DERMABOND ADVANCED (GAUZE/BANDAGES/DRESSINGS) ×2
DERMABOND ADVANCED .7 DNX12 (GAUZE/BANDAGES/DRESSINGS) ×1 IMPLANT
DRAIN CHANNEL 19F RND (DRAIN) ×2 IMPLANT
DRAPE UTILITY XL STRL (DRAPES) ×4 IMPLANT
ELECT CAUTERY BLADE 6.4 (BLADE) ×1 IMPLANT
ELECT REM PT RETURN 9FT ADLT (ELECTROSURGICAL) ×3
ELECT SOLID GEL RDN PRO-PADZ (MISCELLANEOUS) ×3
ELECTRODE REM PT RTRN 9FT ADLT (ELECTROSURGICAL) ×1 IMPLANT
ELECTRODE SOLI GEL RDN PROPADZ (MISCELLANEOUS) IMPLANT
EVACUATOR SILICONE 100CC (DRAIN) ×2 IMPLANT
GAUZE 4X4 16PLY RFD (DISPOSABLE) ×3 IMPLANT
GAUZE SPONGE 4X4 12PLY STRL (GAUZE/BANDAGES/DRESSINGS) ×3 IMPLANT
GLOVE BIO SURGEON STRL SZ 6 (GLOVE) ×3 IMPLANT
GLOVE BIOGEL PI IND STRL 6.5 (GLOVE) IMPLANT
GLOVE BIOGEL PI IND STRL 7.5 (GLOVE) IMPLANT
GLOVE BIOGEL PI INDICATOR 6.5 (GLOVE) ×2
GLOVE BIOGEL PI INDICATOR 7.5 (GLOVE) ×2
GLOVE ECLIPSE 7.5 STRL STRAW (GLOVE) ×2 IMPLANT
GLOVE INDICATOR 6.5 STRL GRN (GLOVE) ×5 IMPLANT
GLOVE SURG SS PI 6.0 STRL IVOR (GLOVE) ×2 IMPLANT
GOWN STRL REUS W/ TWL LRG LVL3 (GOWN DISPOSABLE) ×1 IMPLANT
GOWN STRL REUS W/TWL 2XL LVL3 (GOWN DISPOSABLE) ×4 IMPLANT
GOWN STRL REUS W/TWL LRG LVL3 (GOWN DISPOSABLE) ×9
KIT BASIN OR (CUSTOM PROCEDURE TRAY) ×3 IMPLANT
KIT TURNOVER KIT B (KITS) ×3 IMPLANT
LIGHT WAVEGUIDE WIDE FLAT (MISCELLANEOUS) ×2 IMPLANT
NDL HYPO 25GX1X1/2 BEV (NEEDLE) IMPLANT
NEEDLE HYPO 25GX1X1/2 BEV (NEEDLE) ×3 IMPLANT
NS IRRIG 1000ML POUR BTL (IV SOLUTION) ×3 IMPLANT
PACK SURGICAL SETUP 50X90 (CUSTOM PROCEDURE TRAY) ×3 IMPLANT
PACK UNIVERSAL I (CUSTOM PROCEDURE TRAY) ×3 IMPLANT
PAD ARMBOARD 7.5X6 YLW CONV (MISCELLANEOUS) ×6 IMPLANT
PENCIL BUTTON HOLSTER BLD 10FT (ELECTRODE) ×1 IMPLANT
PENCIL SMOKE EVAC W/HOLSTER (ELECTROSURGICAL) ×2 IMPLANT
SPONGE LAP 18X18 RF (DISPOSABLE) ×4 IMPLANT
STRIP CLOSURE SKIN 1/2X4 (GAUZE/BANDAGES/DRESSINGS) ×2 IMPLANT
SUT ETHILON 2 0 FS 18 (SUTURE) ×4 IMPLANT
SUT MON AB 4-0 PC3 18 (SUTURE) ×3 IMPLANT
SUT SILK 2 0 PERMA HAND 18 BK (SUTURE) ×3 IMPLANT
SUT VIC AB 3-0 SH 27 (SUTURE) ×3
SUT VIC AB 3-0 SH 27X BRD (SUTURE) ×1 IMPLANT
SYR CONTROL 10ML LL (SYRINGE) ×2 IMPLANT
TOWEL OR 17X24 6PK STRL BLUE (TOWEL DISPOSABLE) ×3 IMPLANT
TOWEL OR 17X26 10 PK STRL BLUE (TOWEL DISPOSABLE) ×3 IMPLANT
TUBE CONNECTING 12'X1/4 (SUCTIONS) ×1
TUBE CONNECTING 12X1/4 (SUCTIONS) ×1 IMPLANT
YANKAUER SUCT BULB TIP NO VENT (SUCTIONS) ×2 IMPLANT

## 2018-10-05 NOTE — Progress Notes (Signed)
Received patient from PACU. Patient alert and oriented. Incision clean, dry and intact with liquid skin glue. JP charged to suction. Patient ambulated to bed with assistance. Patient oriented to room/callbell. Will continue to monitor.

## 2018-10-05 NOTE — Transfer of Care (Signed)
Immediate Anesthesia Transfer of Care Note  Patient: Lee Hall  Procedure(s) Performed: RIGHT AXILLARY LYMPH NODE DISSECTION (Right Axilla)  Patient Location: PACU  Anesthesia Type:General  Level of Consciousness: awake, oriented and patient cooperative  Airway & Oxygen Therapy: Patient Spontanous Breathing and Patient connected to nasal cannula oxygen  Post-op Assessment: Report given to RN and Post -op Vital signs reviewed and stable  Post vital signs: Reviewed and stable  Last Vitals:  Vitals Value Taken Time  BP    Temp    Pulse    Resp    SpO2      Last Pain:  Vitals:   10/05/18 0911  TempSrc:   PainSc: 0-No pain         Complications: No apparent anesthesia complications

## 2018-10-05 NOTE — Progress Notes (Signed)
Attempted to text page MD Byer[place] at approximately 1815 in regards to pt. JP drain leaking from the site. Site dressing reinforced with gauze and tegaderm.

## 2018-10-05 NOTE — Anesthesia Procedure Notes (Signed)
Procedure Name: LMA Insertion Date/Time: 10/05/2018 11:28 AM Performed by: Neldon Newport, CRNA Pre-anesthesia Checklist: Timeout performed, Patient being monitored, Suction available, Emergency Drugs available and Patient identified Patient Re-evaluated:Patient Re-evaluated prior to induction Oxygen Delivery Method: Circle system utilized Preoxygenation: Pre-oxygenation with 100% oxygen Induction Type: IV induction Ventilation: Mask ventilation without difficulty LMA: LMA inserted LMA Size: 5.0 Placement Confirmation: positive ETCO2 and breath sounds checked- equal and bilateral Tube secured with: Tape Dental Injury: Teeth and Oropharynx as per pre-operative assessment

## 2018-10-05 NOTE — Interval H&P Note (Signed)
History and Physical Interval Note:  10/05/2018 10:59 AM  Lee Hall  has presented today for surgery, with the diagnosis of melanoma metastatic to right axilla.  The various methods of treatment have been discussed with the patient and family. After consideration of risks, benefits and other options for treatment, the patient has consented to  Procedure(s): RIGHT AXILLARY LYMPH NODE DISSECTION (Right) as a surgical intervention .  The patient's history has been reviewed, patient examined, no change in status, stable for surgery.  I have reviewed the patient's chart and labs.  Questions were answered to the patient's satisfaction.     Stark Klein

## 2018-10-05 NOTE — Discharge Instructions (Signed)
Surgical Drain Home Care °Surgical drains are used to remove extra fluid that normally builds up in a surgical wound after surgery. A surgical drain helps to heal a surgical wound. Different kinds of surgical drains include: °· Active drains. These drains use suction to pull drainage away from the surgical wound. Drainage flows through a tube to a container outside of the body. It is important to keep the bulb or the drainage container flat (compressed) at all times, except while you empty it. Flattening the bulb or container creates suction. The two most common types of active drains are bulb drains and Hemovac drains. °· Passive drains. These drains allow fluid to drain naturally, by gravity. Drainage flows through a tube to a bandage (dressing) or a container outside of the body. Passive drains do not need to be emptied. The most common type of passive drain is the Penrose drain. ° °A drain is placed during surgery. Immediately after surgery, drainage is usually bright red and a little thicker than water. The drainage may gradually turn yellow or pink and become thinner. It is likely that your health care provider will remove the drain when the drainage stops or when the amount decreases to 1-2 Tbsp (15-30 mL) during a 24-hour period. °How to care for your surgical drain °· Keep the skin around the drain dry and covered with a dressing at all times. °· Check your drain area every day for signs of infection. Check for: °? More redness, swelling, or pain. °? Pus or a bad smell. °? Cloudy drainage. °Follow instructions from your health care provider about how to take care of your drain and how to change your dressing. Change your dressing at least one time every day. Change it more often if needed to keep the dressing dry. Make sure you: °1. Gather your supplies, including: °? Tape. °? Germ-free cleaning solution (sterile saline). °? Split gauze drain sponge: 4 x 4 inches (10 x 10 cm). °? Gauze square: 4 x 4 inches  (10 x 10 cm). °2. Wash your hands with soap and water before you change your dressing. If soap and water are not available, use hand sanitizer. °3. Remove the old dressing. Avoid using scissors to do that. °4. Use sterile saline to clean your skin around the drain. °5. Place the tube through the slit in a drain sponge. Place the drain sponge so that it covers your wound. °6. Place the gauze square or another drain sponge on top of the drain sponge that is on the wound. Make sure the tube is between those layers. °7. Tape the dressing to your skin. °8. If you have an active bulb or Hemovac drain, tape the drainage tube to your skin 1-2 inches (2.5-5 cm) below the place where the tube enters your body. Taping keeps the tube from pulling on any stitches (sutures) that you have. °9. Wash your hands with soap and water. °10. Write down the color of your drainage and how often you change your dressing. ° °How to empty your active bulb or Hemovac drain °1. Make sure that you have a measuring cup that you can empty your drainage into. °2. Wash your hands with soap and water. If soap and water are not available, use hand sanitizer. °3. Gently move your fingers down the tube while squeezing very lightly. This is called stripping the tube. This clears any drainage, clots, or tissue from the tube. °? Do not pull on the tube. °? You may need to strip   the tube several times every day to keep the tube clear. 4. Open the bulb cap or the drain plug. Do not touch the inside of the cap or the bottom of the plug. 5. Empty all of the drainage into the measuring cup. 6. Compress the bulb or the container and replace the cap or the plug. To compress the bulb or the container, squeeze it firmly in the middle while you close the cap or plug the container. 7. Write down the amount of drainage that you have in each 24-hour period. If you have less than 2 Tbsp (30 mL) of drainage during 24 hours, contact your health care  provider. 8. Flush the drainage down the toilet. 9. Wash your hands with soap and water. Contact a health care provider if:  You have more redness, swelling, or pain around your drain area.  The amount of drainage that you have is increasing instead of decreasing.  You have pus or a bad smell coming from your drain area.  You have a fever.  You have drainage that is cloudy.  There is a sudden stop or a sudden decrease in the amount of drainage that you have.  Your tube falls out.  Your active draindoes not stay compressedafter you empty it. This information is not intended to replace advice given to you by your health care provider. Make sure you discuss any questions you have with your health care provider. Document Released: 11/12/2000 Document Revised: 04/22/2016 Document Reviewed: 06/04/2015 Elsevier Interactive Patient Education  2018 [employer].    Always review your discharge instruction sheet given to you by the facility where your surgery was performed. IF YOU HAVE DISABILITY OR FAMILY LEAVE FORMS, YOU MUST BRING THEM TO THE OFFICE FOR PROCESSING.   DO NOT GIVE THEM TO YOUR DOCTOR. A prescription for pain medication may be given to you upon discharge.  Take your pain medication as prescribed, if needed.  If narcotic pain medicine is not needed, then you may take acetaminophen (Tylenol) or ibuprofen (Advil) as needed. 1. Take your usually prescribed medications unless otherwise directed. 2. If you need a refill on your pain medication, please contact your pharmacy.  They will contact our office to request authorization.  Prescriptions will not be filled after 5pm or on week-ends. 3. You should follow a light diet the first few days after arrival home, such as soup and crackers, etc.  Resume your normal diet the day after surgery. 4. Most patients will experience some swelling and bruising on the chest and underarm.  Ice packs will help.  Swelling and bruising can take  several days to resolve.  5. It is common to experience some constipation if taking pain medication after surgery.  Increasing fluid intake and taking a stool softener (such as Colace) will usually help or prevent this problem from occurring.  A mild laxative (Milk of Magnesia or Miralax) should be taken according to package instructions if there are no bowel movements after 48 hours. 6. Unless discharge instructions indicate otherwise, leave your bandage dry and in place until your next appointment in 3-5 days.  You may take short showers.  You may have steri-strips (small skin tapes) in place directly over the incision.  These strips should be left on the skin for 7-10 days.  If your surgeon used skin glue on the incision, you may shower in 24 hours.  The glue will flake off over the next 2-3 weeks.  Any sutures or staples will be removed  at the office during your follow-up visit. 7. DRAINS:  If you have drains in place, it is important to keep a list of the amount of drainage produced each day in your drains.  Before leaving the hospital, you should be instructed on drain care.  Call our office if you have any questions about your drains. 8. ACTIVITIES:  You may resume regular (light) daily activities beginning the next day--such as daily self-care, walking, climbing stairs--gradually increasing activities as tolerated.  You may have sexual intercourse when it is comfortable.  Refrain from any heavy lifting or straining until approved by your doctor. a. You may drive when you are no longer taking prescription pain medication, you can comfortably wear a seatbelt, and you can safely maneuver your car and apply brakes. b. RETURN TO WORK:  __________________________________________________________ 9. You should see your doctor in the office for a follow-up appointment approximately 3-5 days after your surgery.  Your doctors nurse will typically make your follow-up appointment when she calls you with your  pathology report.  Expect your pathology report 2-3 business days after your surgery.  You may call to check if you do not hear from [place] after three days.   10. OTHER INSTRUCTIONS: ______________________________________________________________________________________________ ____________________________________________________________________________________________ WHEN TO CALL YOUR DOCTOR: 1. Fever over 101.0 2. Nausea and/or vomiting 3. Extreme swelling or bruising 4. Continued bleeding from incision. 5. Increased pain, redness, or drainage from the incision. The clinic staff is available to answer your questions during regular business hours.  Please dont hesitate to call and ask to speak to one of the nurses for clinical concerns.  If you have a medical emergency, go to the nearest emergency room or call 911.  A surgeon from [facility] is always on call at the hospital. [address], [place], [place], [place]  [address] ? [address], [place], [place]   [address] [phone] ? [phone] ? FAX [phone]

## 2018-10-05 NOTE — Op Note (Signed)
PRE-OPERATIVE DIAGNOSIS: melanoma metastatic to right axillary lymph nodes  POST-OPERATIVE DIAGNOSIS:  Same  PROCEDURE:  Procedure(s): Right axillary lymph node dissection.    SURGEON:  Surgeon(s): Stark Klein, MD  ASSISTANT: Judyann Munson, RNFA  ANESTHESIA:   local and general  DRAINS: (86 Fr) Blake drain(s) in the axilla   LOCAL MEDICATIONS USED:  BUPIVICAINE  and LIDOCAINE   SPECIMEN:  Source of Specimen:  right axillary lymph node contents.  DISPOSITION OF SPECIMEN:  PATHOLOGY  COUNTS:  YES  DICTATION: .Dragon Dictation  PLAN OF CARE: Admit for overnight observation  PATIENT DISPOSITION:  PACU - hemodynamically stable.  FINDINGS:  Several bulky nodes in the axilla.    EBL: <50 mL  PROCEDURE:  Pt was identified in the holding area where he was taken to the OR and placed supine on the OR table.  General anesthesia was induced.  The right arm and chest were prepped and draped in sterile fashion.  A timeout was performed according to the surgical safety checklist.  When all was correct, we continued.    An curvalinear incision was made in the axilla. An axillary dissection was performed with removal of the associated lymph nodes and surrounding adipose tissue. This included levels I and II, and III. This was accomplished by exposing the axillary vein anteriorly and inferiorly to the level of the pectoralis minor and laterally over the latissimus dorsi muscle. Posteriorly, the dissection continued to the subscapularis.  Small venous tributaries, lymphatics, and vessels were clipped and ligated or cauterized and divided. The subscapularis muscle was skeletonized. The long thoracic and thoracodorsal neurovascular bundles were identified and preserved.    The pectoralis minor was then taken down laterally and the level III nodes were taken.  The wound was irrigated.  A 19 Fr Blake drain was placed in the axilla and secured with a 2-0 nylon.  The skin was then closed with a 3-0  Vicryl deep dermal interrupted and a 4-0 vicryl subcuticular closure in layers.

## 2018-10-06 ENCOUNTER — Encounter (HOSPITAL_COMMUNITY): Payer: Self-pay | Admitting: General Surgery

## 2018-10-06 DIAGNOSIS — C439 Malignant melanoma of skin, unspecified: Secondary | ICD-10-CM | POA: Diagnosis not present

## 2018-10-06 DIAGNOSIS — Z7984 Long term (current) use of oral hypoglycemic drugs: Secondary | ICD-10-CM | POA: Diagnosis not present

## 2018-10-06 DIAGNOSIS — Z79899 Other long term (current) drug therapy: Secondary | ICD-10-CM | POA: Diagnosis not present

## 2018-10-06 DIAGNOSIS — Z87891 Personal history of nicotine dependence: Secondary | ICD-10-CM | POA: Diagnosis not present

## 2018-10-06 DIAGNOSIS — E119 Type 2 diabetes mellitus without complications: Secondary | ICD-10-CM | POA: Diagnosis not present

## 2018-10-06 DIAGNOSIS — Z95810 Presence of automatic (implantable) cardiac defibrillator: Secondary | ICD-10-CM | POA: Diagnosis not present

## 2018-10-06 DIAGNOSIS — I219 Acute myocardial infarction, unspecified: Secondary | ICD-10-CM | POA: Diagnosis not present

## 2018-10-06 DIAGNOSIS — C773 Secondary and unspecified malignant neoplasm of axilla and upper limb lymph nodes: Secondary | ICD-10-CM | POA: Diagnosis not present

## 2018-10-06 DIAGNOSIS — I2510 Atherosclerotic heart disease of native coronary artery without angina pectoris: Secondary | ICD-10-CM | POA: Diagnosis not present

## 2018-10-06 LAB — CBC
HEMATOCRIT: 41.8 % (ref 39.0–52.0)
HEMOGLOBIN: 13.8 g/dL (ref 13.0–17.0)
MCH: 30.1 pg (ref 26.0–34.0)
MCHC: 33 g/dL (ref 30.0–36.0)
MCV: 91.3 fL (ref 80.0–100.0)
NRBC: 0 % (ref 0.0–0.2)
Platelets: 141 10*3/uL — ABNORMAL LOW (ref 150–400)
RBC: 4.58 MIL/uL (ref 4.22–5.81)
RDW: 13.3 % (ref 11.5–15.5)
WBC: 10.3 10*3/uL (ref 4.0–10.5)

## 2018-10-06 LAB — BASIC METABOLIC PANEL
Anion gap: 10 (ref 5–15)
BUN: 18 mg/dL (ref 8–23)
CHLORIDE: 103 mmol/L (ref 98–111)
CO2: 22 mmol/L (ref 22–32)
Calcium: 9 mg/dL (ref 8.9–10.3)
Creatinine, Ser: 1.19 mg/dL (ref 0.61–1.24)
GFR calc Af Amer: >60 mL/min (ref >60)
Glucose, Bld: 267 mg/dL — ABNORMAL HIGH (ref 70–99)
POTASSIUM: 4.1 mmol/L (ref 3.5–5.1)
SODIUM: 135 mmol/L (ref 135–145)

## 2018-10-06 MED ORDER — OXYCODONE HCL 5 MG PO TABS: 5.0000 mg | ORAL_TABLET | Freq: Four times a day (QID) | ORAL | 0 refills | Status: DC | PRN

## 2018-10-06 MED ORDER — GABAPENTIN 100 MG PO CAPS: 100.0000 mg | ORAL_CAPSULE | Freq: Two times a day (BID) | ORAL | 2 refills | Status: DC

## 2018-10-06 NOTE — Plan of Care (Signed)
  Problem: Education: Goal: Knowledge of General Education information will improve Description Including pain rating scale, medication(s)/side effects and non-pharmacologic comfort measures Outcome: Progressing Note:  POC and pain management reviewed with pt.   

## 2018-10-06 NOTE — Progress Notes (Signed)
Lee Hall to be D/C'd  per MD order. Discussed with the patient and all questions fully answered.  VSS, Skin clean, dry and intact without evidence of skin break down, no evidence of skin tears noted.  IV catheter discontinued intact. Site without signs and symptoms of complications. Dressing and pressure applied.  An After Visit Summary was printed and given to the patient. Patient received prescription.  D/c education completed with patient/family including follow up instructions, medication list, d/c activities limitations if indicated, with other d/c instructions as indicated by MD - patient able to verbalize understanding, all questions fully answered.   Patient instructed to return to ED, call 911, or call MD for any changes in condition.   Patient to be escorted via [place], and D/C home via private auto.

## 2018-10-06 NOTE — Discharge Summary (Signed)
Physician Discharge Summary  Patient ID: Lee Hall MRN: [id number] DOB/AGE: May 23, 1951 67 y.o.  Admit date: 10/05/2018 Discharge date: 10/06/2018  Admission Diagnoses: Patient Active Problem List   Diagnosis Date Noted  . Malignant melanoma metastatic to lymph node ([place]) 10/05/2018  . Tremor of right hand 08/07/2018  . Melanoma of skin ([place]) 08/07/2018  . Abnormal positron emission tomography (PET) scan 08/07/2018  . ISCHEMIC CARDIOMYOPATHY 03/19/2009  . PAROXYSMAL VENTRICULAR TACHYCARDIA 03/19/2009  . CAD, NATIVE VESSEL 02/18/2009  . LEFT VENTRICULAR MURAL THROMBUS 02/18/2009  . Chronic systolic heart failure ([place]) 01/02/2009  . TACHYCARDIA 01/02/2009  . Implantable cardioverter-defibrillator (ICD) in situ 01/02/2009    Discharge Diagnoses:  Active Problems:   Malignant melanoma metastatic to lymph node ([place]) and same as above.    Discharged Condition: stable  Hospital Course:  Pt was admitted to the floor following right axillary lymph node dissection.  He did well overnight without cardiac events.  He had minimal pain.  Drain output was manageable. Sister and patient demonstrated drain management.   He was ambulatory.  He was discharged to home in stable condition.    Consults: None  Significant Diagnostic Studies: labs: HCT 41.8, Gluc 267  Treatments: surgery: see above.  Discharge Exam: Blood pressure 103/62, pulse 68, temperature 97.7 F (36.5 C), temperature source Oral, resp. rate 16, height 5\' 11"  (1.803 m), weight 84.3 kg, SpO2 96 %. General appearance: alert, cooperative and no distress Resp: breathing comfortably Cardio: regular rate and rhythm drain serosang  Disposition: Discharge disposition: 01-Home or Self Care       Discharge Instructions    Call MD for:  difficulty breathing, headache or visual disturbances   Complete by:  As directed    Call MD for:  hives   Complete by:  As directed    Call MD for:  persistant nausea and  vomiting   Complete by:  As directed    Call MD for:  redness, tenderness, or signs of infection (pain, swelling, redness, odor or green/yellow discharge around incision site)   Complete by:  As directed    Call MD for:  severe uncontrolled pain   Complete by:  As directed    Call MD for:  temperature >100.4   Complete by:  As directed    Diet - low sodium heart healthy   Complete by:  As directed    Discharge wound care:   Complete by:  As directed    Change drain dressing prior to d/c.   Increase activity slowly   Complete by:  As directed      Allergies as of 10/06/2018   No Known Allergies     Medication List    TAKE these medications   acyclovir 800 MG tablet Commonly known as:  ZOVIRAX Take 400 mg by mouth See admin instructions. Take 400 mg by mouth in the evening every other day   amiodarone 200 MG tablet Commonly known as:  PACERONE Take 1 tablet (200 mg total) by mouth daily.   aspirin 81 MG tablet Take 81 mg by mouth daily.   carvedilol 6.25 MG tablet Commonly known as:  COREG Take 1 tablet (6.25 mg total) by mouth 2 (two) times daily with a meal.   gabapentin 100 MG capsule Commonly known as:  NEURONTIN Take 1 capsule (100 mg total) by mouth 2 (two) times daily for 15 days.   ipratropium 0.06 % nasal spray Commonly known as:  ATROVENT Place 2 sprays into both nostrils 3 (  three) times daily.   metFORMIN 500 MG tablet Commonly known as:  GLUCOPHAGE Take 1,000 mg by mouth at bedtime.   mexiletine 150 MG capsule Commonly known as:  MEXITIL Take 150 mg by mouth 3 (three) times daily.   oxyCODONE 5 MG immediate release tablet Commonly known as:  Oxy IR/ROXICODONE Take 1-2 tablets (5-10 mg total) by mouth every 6 (six) hours as needed for moderate pain.   RETAINE MGD 0.5-0.5 % Emul Generic drug:  Light Mineral Oil-Mineral Oil Place 1 drop into both eyes 4 (four) times daily as needed (for dry eyes).   rosuvastatin 10 MG tablet Commonly known as:   CRESTOR Take 10 mg by mouth every evening.   sacubitril-valsartan 24-26 MG Commonly known as:  ENTRESTO Take 1 tablet by mouth 2 (two) times daily.            Discharge Care Instructions  (From admission, onward)         Start     Ordered   10/06/18 0000  Discharge wound care:    Comments:  Change drain dressing prior to d/c.   10/06/18 0916         Follow-up Information    Stark Klein, MD In 2 weeks.   Specialty:  General Surgery Contact information: [address] [place] [address] [phone]           Signed: Stark Klein 10/06/2018, 9:16 AM

## 2018-10-06 NOTE — Anesthesia Postprocedure Evaluation (Signed)
Anesthesia Post Note  Patient: Lee Hall  Procedure(s) Performed: RIGHT AXILLARY LYMPH NODE DISSECTION (Right Axilla)     Patient location during evaluation: PACU Anesthesia Type: General Level of consciousness: awake and alert Pain management: pain level controlled Vital Signs Assessment: post-procedure vital signs reviewed and stable Respiratory status: spontaneous breathing, nonlabored ventilation, respiratory function stable and patient connected to nasal cannula oxygen Cardiovascular status: blood pressure returned to baseline and stable Postop Assessment: no apparent nausea or vomiting Anesthetic complications: no    Last Vitals:  Vitals:   10/06/18 0214 10/06/18 0437  BP: (!) 100/59 103/62  Pulse: 70 68  Resp:  16  Temp: 36.7 C 36.5 C  SpO2: 97% 96%    Last Pain:  Vitals:   10/06/18 0437  TempSrc: Oral  PainSc:                  [place]

## 2018-10-07 ENCOUNTER — Telehealth: Payer: Self-pay | Admitting: Physician Assistant

## 2018-10-07 NOTE — Telephone Encounter (Signed)
67 year old male with a history of systolic heart failure, ischemic cardiomyopathy, VT, status post ICD.  He had a lymph node resection yesterday.  He did receive a dose of oxycodone and is on low-dose gabapentin.  Yesterday evening, he felt sluggish and his pressure was in the 90s over 50s.  He held his carvedilol.  This morning he was feeling better with pressures in the 120s over 60s.  He took his Coreg and Entresto this morning.  Now his blood pressure is running in the 90s over 50s to 100 over 50s.  He felt sluggish again for a little while.  When I called him he was feeling better.  He denies any weight gain, swelling, shortness of breath, chest pain.  He denies any ICD discharges.  He denies any palpitations.  I suspect his symptoms are related to his procedure and medications from yesterday. PLAN: 1.  Decrease Coreg to 3.125 mg twice daily 2.  Hold Coreg and Entresto if blood pressure <157 systolic 3.  I will have Dr. Tanna Furry nurse contact him Monday to determine if he should resume Coreg 6.25 mg twice daily 4.  He knows to call back sooner if symptoms persist; go to ED if symptoms worsen 5.  I asked him to call the surgeon to see if he should continue Gabapentin. Richardson Dopp, PA-C    10/07/2018 10:34 AM

## 2018-10-09 ENCOUNTER — Other Ambulatory Visit: Payer: Medicare HMO

## 2018-10-10 ENCOUNTER — Encounter: Payer: Self-pay | Admitting: Oncology

## 2018-10-10 ENCOUNTER — Telehealth: Payer: Self-pay | Admitting: Oncology

## 2018-10-10 ENCOUNTER — Inpatient Hospital Stay: Payer: Medicare HMO | Attending: Oncology | Admitting: Oncology

## 2018-10-10 ENCOUNTER — Telehealth: Payer: Self-pay | Admitting: General Surgery

## 2018-10-10 VITALS — BP 112/70 | HR 66 | Temp 97.7°F | Resp 17 | Ht 71.0 in | Wt 185.6 lb

## 2018-10-10 DIAGNOSIS — C4361 Malignant melanoma of right upper limb, including shoulder: Secondary | ICD-10-CM

## 2018-10-10 DIAGNOSIS — Z7984 Long term (current) use of oral hypoglycemic drugs: Secondary | ICD-10-CM | POA: Diagnosis not present

## 2018-10-10 DIAGNOSIS — Z7982 Long term (current) use of aspirin: Secondary | ICD-10-CM | POA: Diagnosis not present

## 2018-10-10 DIAGNOSIS — E079 Disorder of thyroid, unspecified: Secondary | ICD-10-CM

## 2018-10-10 DIAGNOSIS — C773 Secondary and unspecified malignant neoplasm of axilla and upper limb lymph nodes: Secondary | ICD-10-CM | POA: Diagnosis not present

## 2018-10-10 DIAGNOSIS — Z79899 Other long term (current) drug therapy: Secondary | ICD-10-CM

## 2018-10-10 DIAGNOSIS — C439 Malignant melanoma of skin, unspecified: Secondary | ICD-10-CM

## 2018-10-10 DIAGNOSIS — R5383 Other fatigue: Secondary | ICD-10-CM | POA: Diagnosis not present

## 2018-10-10 MED ORDER — PROCHLORPERAZINE MALEATE 10 MG PO TABS: 10.0000 mg | ORAL_TABLET | Freq: Four times a day (QID) | ORAL | 0 refills | Status: DC | PRN

## 2018-10-10 NOTE — Progress Notes (Signed)
START ON PATHWAY REGIMEN - Melanoma     A cycle is every 28 days:     Nivolumab   **Always confirm dose/schedule in your pharmacy ordering system**  Patient Characteristics: Postoperative without Neoadjuvant Therapy (Pathologic Staging), Any pT, pN+, BRAF V600 Wild Type / BRAF V600 Results Pending or Unknown, Stage IIIB/IIIC/IIID Disease Subtype: Cutaneous Current Disease Status: Postoperative without Neoadjuvant Therapy (Pathologic Staging) BRAF V600 Mutation Status: Awaiting BRAF V600 Results AJCC T Category: pTX AJCC N Category: pNX AJCC M Category: cM0 AJCC 8 Stage Grouping: IIIA Intent of Therapy: Curative Intent, Discussed with Patient

## 2018-10-10 NOTE — Progress Notes (Signed)
Hematology and Oncology Follow Up Visit  Lee Hall [id number] 10-Nov-1951 67 y.o. 10/10/2018 8:43 AM Lee Hall, MDKoberlein, Lee Berg, MD   Principle Diagnosis: 67 year old man with melanoma of the right shoulder diagnosed in January 2019.  He was found to have a 4 mm lesion with a T3 be ulcerated melanoma on pathology.  He had a positive margin and a right axillary lymph node involvement.   Prior Therapy:  He is status post resection in December 09, 2017 completed in [place].  He had positive margins without any lymph node sampling.  He is status post right axillary lymph node dissection completed on 10/05/2018 by Dr. Barry Hall.   Current therapy: Under evaluation for systemic therapy.  Interim History: Mr. progression presents today for a follow-up visit.  Since last visit, he underwent lymph node dissection completed on 10/05/2018 without any complications.  He has tolerated the therapy well and did not develop any postoperative complications.  He does report some soreness in limitation movement in his shoulder.  His scar is well-healed although he has a drain in place.  He denies any back pain or pathological fractures.  His performance status and quality of life remains excellent.   He does not report any headaches, blurry vision, syncope or seizures. Does not report any fevers, chills or sweats.  Does not report any cough, wheezing or hemoptysis.  Does not report any chest pain, palpitation, orthopnea or leg edema.  Does not report any nausea, vomiting or abdominal pain.  Does not report any constipation or diarrhea.  Does not report any skeletal complaints.    Does not report frequency, urgency or hematuria.  Does not report any skin rashes or lesions. Does not report any heat or cold intolerance.  Does not report any lymphadenopathy or petechiae.  Does not report any anxiety or depression.  Remaining review of systems is negative.    Medications: I have reviewed the  patient's current medications.  Current Outpatient Medications  Medication Sig Dispense Refill  . acyclovir (ZOVIRAX) 800 MG tablet Take 400 mg by mouth See admin instructions. Take 400 mg by mouth in the evening every other day    . amiodarone (PACERONE) 200 MG tablet Take 1 tablet (200 mg total) by mouth daily. 90 tablet 3  . aspirin 81 MG tablet Take 81 mg by mouth daily.      . carvedilol (COREG) 6.25 MG tablet Take 1 tablet (6.25 mg total) by mouth 2 (two) times daily with a meal. 180 tablet 3  . gabapentin (NEURONTIN) 100 MG capsule Take 1 capsule (100 mg total) by mouth 2 (two) times daily for 15 days. 30 capsule 2  . ipratropium (ATROVENT) 0.06 % nasal spray Place 2 sprays into both nostrils 3 (three) times daily. 90 mL 3  . Light Mineral Oil-Mineral Oil (RETAINE MGD) 0.5-0.5 % EMUL Place 1 drop into both eyes 4 (four) times daily as needed (for dry eyes).    . metFORMIN (GLUCOPHAGE) 500 MG tablet Take 1,000 mg by mouth at bedtime.    . mexiletine (MEXITIL) 150 MG capsule Take 150 mg by mouth 3 (three) times daily.    Marland Kitchen oxyCODONE (OXY IR/ROXICODONE) 5 MG immediate release tablet Take 1-2 tablets (5-10 mg total) by mouth every 6 (six) hours as needed for moderate pain. 20 tablet 0  . rosuvastatin (CRESTOR) 10 MG tablet Take 10 mg by mouth every evening.     . sacubitril-valsartan (ENTRESTO) 24-26 MG Take 1 tablet by mouth 2 (two)  times daily. 60 tablet 11   No current facility-administered medications for this visit.      Allergies: No Known Allergies  Past Medical History, Surgical history, Social history, and Family History were reviewed and updated.  Review of Systems:  Remaining ROS negative.  Physical Exam: Blood pressure 112/70, pulse 66, temperature 97.7 F (36.5 C), temperature source Oral, resp. rate 17, height 5\' 11"  (1.803 m), weight 185 lb 9.6 oz (84.2 kg), SpO2 99 %.   ECOG: 0 General appearance: alert and cooperative appeared without distress. Head:  Normocephalic, without obvious abnormality Oropharynx: No oral thrush or ulcers. Eyes: No scleral icterus.  Pupils are equal and round reactive to light. Lymph nodes: No palpable adenopathy noted in the axilla. Heart:regular rate and rhythm, S1, S2 normal, no murmur, click, rub or gallop Lung:chest clear, no wheezing, rales, normal symmetric air entry Abdomin: soft, non-tender, without masses or organomegaly. Neurological: No motor, sensory deficits.  Intact deep tendon reflexes. Skin: Well-healed scar noted on the right mid axillary region.  No erythema or induration. Musculoskeletal: No joint deformity or effusion.     Lab Results: Lab Results  Component Value Date   WBC 10.3 10/06/2018   HGB 13.8 10/06/2018   HCT 41.8 10/06/2018   MCV 91.3 10/06/2018   PLT 141 (L) 10/06/2018     Chemistry      Component Value Date/Time   NA 135 10/06/2018 0230   NA 141 08/14/2018 1405   K 4.1 10/06/2018 0230   CL 103 10/06/2018 0230   CO2 22 10/06/2018 0230   BUN 18 10/06/2018 0230   BUN 19 08/14/2018 1405   CREATININE 1.19 10/06/2018 0230      Component Value Date/Time   CALCIUM 9.0 10/06/2018 0230   ALKPHOS 67 07/14/2018 1626   AST 13 07/14/2018 1626   ALT 19 07/14/2018 1626   BILITOT 0.3 07/14/2018 1626     CLINICAL DATA:  Initial treatment strategy for staging of malignant melanoma.  EXAM: NUCLEAR MEDICINE PET WHOLE BODY  TECHNIQUE: 9.5 mCi F-18 FDG was injected intravenously. Full-ring PET imaging was performed from the skull base to thigh after the radiotracer. CT data was obtained and used for attenuation correction and anatomic localization.  Fasting blood glucose: 145 mg/dl  COMPARISON:  Clinic note of 08/10/2018. This describes a melanoma of the right shoulder. Prior PET detail in a right axillary nodal metastasis.  FINDINGS: Mediastinal blood pool activity: SUV max 2.6  HEAD/NECK: No areas of abnormal hypermetabolism.  Incidental CT findings:  Bilateral maxillary sinus mucosal thickening. No cervical adenopathy.  CHEST: No pulmonary parenchymal hypermetabolism.  Right axillary node measures 3.5 x 2.1 cm and a S.U.V. max of 11.7 on image 96/4. inferior to this, a right axillary node measures 1.2 cm and a S.U.V. max of 11.4 on image 101/4.  Incidental CT findings: Pacer. Prior left ventricular infarct with wall calcification. Small pericardial effusion is likely physiologic. Left base scarring.  ABDOMEN/PELVIS: No abdominopelvic parenchymal or nodal hypermetabolism identified.  Incidental CT findings: Normal adrenal glands. Abdominal aortic atherosclerosis. Scattered colonic diverticula. Bilateral fat containing inguinal hernias, larger on the left.  SKELETON: An eccentric left focus of L1 hypermetabolism is without well-defined CT correlate and measures a S.U.V. max of 5.5, including on approximately image 152/4.  Left eleventh rib hypermetabolism with healing fracture. This measures a S.U.V. max of 5.8 on image 158/4.  No subcutaneous or skin hypermetabolism about the right shoulder. There is presumably degenerative low-level hypermetabolism about the right sacroiliac joint.  Incidental CT findings: none  EXTREMITIES: A focus of hypermetabolism about the superficial left lateral midfoot measures a S.U.V. max of 5.3.  Incidental CT findings: none  IMPRESSION: 1. Right axillary nodal metastasis. 2. L1 focus of osseous hypermetabolism is without CT correlate. Consider dedicated pre and post-contrast lumbar spine MRI. 3. Hypermetabolic healing eleventh left rib fracture is presumably posttraumatic. Correlate with prior trauma. If no prior trauma, isolated osseous metastasis with pathologic fracture would be a concern. 4. Superficial hypermetabolism about the left lateral mid foot is indeterminate but warrants physical exam correlation.    Impression and Plan:  67 year old man with:  1.     T3b melanoma of the right shoulder with lymph node involvement diagnosed and January 2019.    He is found to have a 4 mm lesion that is ulcerated and axillary lymph node involvement.  He is status post initial excision completed in January 2019 in [place] followed by a lymph node dissection completed by Dr. Barry Hall on October 05, 2018.  PET CT scan obtained on 09/04/2018 was reviewed again and discussed with the patient.  His disease clearly has involve the right axilla although no distant metastasis noted.  He is 1 spine lesion was noted to be benign based on CT scan criteria.  The final pathology from his lymph node dissection is currently pending.  The natural course of this disease was reviewed again and treatment options were discussed.  Given the fact that his disease is predominantly in the right axilla that has been adequately resected at this time, additional therapy would be in the adjuvant setting.  Adjuvant nivolumab was discussed today in detail.  Complication associated with this therapy was reviewed.  These complications include nausea, fatigue, pruritus, thyroid disease and rarely colitis, pneumonitis and arthritis.  The plan is to proceed with monthly infusion for total of 12 months with intermittent scanning in between.  He is agreeable to proceed without tentatively in January of next year based on his preference.    2.  Skin protection strategies: Continues to emphasize the importance of sun protection moving forward.  3.  Immune mediated complications: We will continue to monitor him closely including thyroid function.  4.  CNS surveillance: We will repeat MRI of the brain with his next imaging studies.  5.  Follow-up: We will be in January 2020 for tentatively to start nivolumab therapy.  30  minutes was spent with the patient face-to-face today.  More than 50% of time was dedicated to discussing his disease status, reviewing imaging studies, discussing treatment options and  complications related therapy.   Zola Button, MD 11/12/20198:43 AM

## 2018-10-10 NOTE — Telephone Encounter (Signed)
Left message about pathology.  Will forward to Dr. Alen Blew.

## 2018-10-10 NOTE — Telephone Encounter (Signed)
Outreach made to Pt.  No answer.  Left detailed message requesting call back to this nurse.  Left this nurse name and #.

## 2018-10-10 NOTE — Telephone Encounter (Signed)
Received MyChart message from Pt.  Per Pt his blood pressures have returned to normal.  Pt has discontinued gabapentin.  Pt is back to taking his carvedilol 6.25 mg bid.    Advised Pt to call office if any further issues.

## 2018-10-10 NOTE — Telephone Encounter (Signed)
R/s apt per patient rescheduled per patient request due to work scheduled - patient is aware he can get an updated schedule tomorrow at chemo edu . Class.

## 2018-10-11 ENCOUNTER — Inpatient Hospital Stay: Payer: Medicare HMO

## 2018-10-11 ENCOUNTER — Encounter: Payer: Self-pay | Admitting: Family Medicine

## 2018-10-13 ENCOUNTER — Other Ambulatory Visit: Payer: Self-pay | Admitting: Family Medicine

## 2018-10-13 MED ORDER — EMPAGLIFLOZIN 10 MG PO TABS: 10.0000 mg | ORAL_TABLET | Freq: Every day | ORAL | 2 refills | Status: DC

## 2018-10-16 ENCOUNTER — Ambulatory Visit (INDEPENDENT_AMBULATORY_CARE_PROVIDER_SITE_OTHER): Payer: Medicare HMO

## 2018-10-16 ENCOUNTER — Telehealth: Payer: Self-pay

## 2018-10-16 DIAGNOSIS — I5022 Chronic systolic (congestive) heart failure: Secondary | ICD-10-CM

## 2018-10-16 DIAGNOSIS — I255 Ischemic cardiomyopathy: Secondary | ICD-10-CM

## 2018-10-16 NOTE — Telephone Encounter (Signed)
Spoke with pt and reminded pt of remote transmission that is due today. Pt verbalized understanding.   

## 2018-10-17 NOTE — Progress Notes (Signed)
Remote ICD transmission.   

## 2018-10-19 ENCOUNTER — Encounter: Payer: Self-pay | Admitting: Cardiology

## 2018-10-23 NOTE — Telephone Encounter (Signed)
Please advise I do not see any labs ordered by [place]

## 2018-10-24 ENCOUNTER — Other Ambulatory Visit (HOSPITAL_COMMUNITY)
Admission: RE | Admit: 2018-10-24 | Discharge: 2018-10-24 | Disposition: A | Discharge status: Home / Self Care | Payer: Medicare HMO | Source: Ambulatory Visit | Attending: General Surgery | Admitting: General Surgery

## 2018-10-24 DIAGNOSIS — C773 Secondary and unspecified malignant neoplasm of axilla and upper limb lymph nodes: Secondary | ICD-10-CM | POA: Diagnosis not present

## 2018-10-24 DIAGNOSIS — C4361 Malignant melanoma of right upper limb, including shoulder: Secondary | ICD-10-CM | POA: Diagnosis not present

## 2018-10-30 ENCOUNTER — Other Ambulatory Visit: Payer: Medicare HMO

## 2018-10-31 ENCOUNTER — Ambulatory Visit (INDEPENDENT_AMBULATORY_CARE_PROVIDER_SITE_OTHER): Payer: Medicare HMO

## 2018-10-31 DIAGNOSIS — Z23 Encounter for immunization: Secondary | ICD-10-CM

## 2018-10-31 NOTE — Progress Notes (Signed)
Per orders of Dr. Inocente Salles, injection of Shingrix 0.5 ml given by Wyvonne Lenz. Patient tolerated injection well.

## 2018-11-14 ENCOUNTER — Encounter (HOSPITAL_COMMUNITY): Payer: Self-pay | Admitting: General Surgery

## 2018-12-04 ENCOUNTER — Telehealth: Payer: Self-pay | Admitting: Oncology

## 2018-12-04 NOTE — Telephone Encounter (Signed)
Spoke with patient and confirmed appointment changes due to [facility] prostate clinic

## 2018-12-05 ENCOUNTER — Ambulatory Visit: Payer: Medicare HMO | Admitting: Oncology

## 2018-12-05 ENCOUNTER — Other Ambulatory Visit: Payer: Medicare HMO

## 2018-12-05 ENCOUNTER — Ambulatory Visit: Payer: Medicare HMO

## 2018-12-08 ENCOUNTER — Ambulatory Visit (HOSPITAL_BASED_OUTPATIENT_CLINIC_OR_DEPARTMENT_OTHER): Payer: Medicare HMO | Admitting: Medical

## 2018-12-08 ENCOUNTER — Encounter: Payer: Self-pay | Admitting: Nurse Practitioner

## 2018-12-08 ENCOUNTER — Inpatient Hospital Stay (HOSPITAL_BASED_OUTPATIENT_CLINIC_OR_DEPARTMENT_OTHER): Payer: Medicare HMO | Admitting: Nurse Practitioner

## 2018-12-08 ENCOUNTER — Inpatient Hospital Stay: Payer: Medicare HMO

## 2018-12-08 ENCOUNTER — Telehealth: Payer: Self-pay | Admitting: Nurse Practitioner

## 2018-12-08 ENCOUNTER — Inpatient Hospital Stay: Payer: Medicare HMO | Attending: Oncology

## 2018-12-08 VITALS — BP 110/65 | HR 64 | Temp 97.7°F | Resp 17 | Ht 71.0 in | Wt 184.7 lb

## 2018-12-08 DIAGNOSIS — Z5112 Encounter for antineoplastic immunotherapy: Secondary | ICD-10-CM

## 2018-12-08 DIAGNOSIS — R21 Rash and other nonspecific skin eruption: Secondary | ICD-10-CM | POA: Insufficient documentation

## 2018-12-08 DIAGNOSIS — C773 Secondary and unspecified malignant neoplasm of axilla and upper limb lymph nodes: Secondary | ICD-10-CM | POA: Diagnosis not present

## 2018-12-08 DIAGNOSIS — C779 Secondary and unspecified malignant neoplasm of lymph node, unspecified: Principal | ICD-10-CM

## 2018-12-08 DIAGNOSIS — C439 Malignant melanoma of skin, unspecified: Secondary | ICD-10-CM

## 2018-12-08 DIAGNOSIS — T80818A Extravasation of other vesicant agent, initial encounter: Secondary | ICD-10-CM

## 2018-12-08 DIAGNOSIS — C4361 Malignant melanoma of right upper limb, including shoulder: Secondary | ICD-10-CM

## 2018-12-08 LAB — CBC WITH DIFFERENTIAL (CANCER CENTER ONLY)
Abs Immature Granulocytes: 0.02 10*3/uL (ref 0.00–0.07)
BASOS ABS: 0 10*3/uL (ref 0.0–0.1)
Basophils Relative: 1 %
Eosinophils Absolute: 0.2 10*3/uL (ref 0.0–0.5)
Eosinophils Relative: 2 %
HCT: 44.7 % (ref 39.0–52.0)
Hemoglobin: 14.9 g/dL (ref 13.0–17.0)
Immature Granulocytes: 0 %
Lymphocytes Relative: 19 %
Lymphs Abs: 1.2 10*3/uL (ref 0.7–4.0)
MCH: 31.2 pg (ref 26.0–34.0)
MCHC: 33.3 g/dL (ref 30.0–36.0)
MCV: 93.5 fL (ref 80.0–100.0)
Monocytes Absolute: 0.7 10*3/uL (ref 0.1–1.0)
Monocytes Relative: 11 %
Neutro Abs: 4.2 10*3/uL (ref 1.7–7.7)
Neutrophils Relative %: 67 %
Platelet Count: 143 10*3/uL — ABNORMAL LOW (ref 150–400)
RBC: 4.78 MIL/uL (ref 4.22–5.81)
RDW: 13.3 % (ref 11.5–15.5)
WBC Count: 6.2 10*3/uL (ref 4.0–10.5)
nRBC: 0 % (ref 0.0–0.2)

## 2018-12-08 LAB — CMP (CANCER CENTER ONLY)
ALT: 27 U/L (ref 10–47)
ANION GAP: 9 (ref 5–15)
AST: 19 U/L (ref 11–38)
Albumin: 4.1 g/dL (ref 3.5–5.0)
Alkaline Phosphatase: 72 U/L (ref 38–126)
BUN: 18 mg/dL (ref 8–23)
CO2: 24 mmol/L (ref 22–32)
Calcium: 9.4 mg/dL (ref 8.9–10.3)
Chloride: 107 mmol/L (ref 98–111)
Creatinine: 1.13 mg/dL (ref 0.60–1.20)
GFR, Est AFR Am: >60 mL/min (ref >60)
GFR, Estimated: >60 mL/min (ref >60)
Glucose, Bld: 128 mg/dL — ABNORMAL HIGH (ref 70–99)
Potassium: 4.4 mmol/L (ref 3.5–5.1)
Sodium: 140 mmol/L (ref 135–145)
TOTAL PROTEIN: 7.1 g/dL (ref 6.5–8.1)
Total Bilirubin: 0.6 mg/dL (ref 0.2–1.6)

## 2018-12-08 LAB — TSH: TSH: 3.426 u[IU]/mL (ref 0.320–4.118)

## 2018-12-08 MED ORDER — SODIUM CHLORIDE 0.9 % IV SOLN
Freq: Once | INTRAVENOUS | Status: AC
  Administered 2018-12-08: 13:00:00 via INTRAVENOUS
  Filled 2018-12-08: qty 250

## 2018-12-08 MED ORDER — SODIUM CHLORIDE 0.9 % IV SOLN
480.0000 mg | Freq: Once | INTRAVENOUS | Status: AC
  Administered 2018-12-08: 480 mg via INTRAVENOUS
  Filled 2018-12-08: qty 48

## 2018-12-08 NOTE — Patient Instructions (Signed)
[facility] Discharge Instructions for Patients Receiving Chemotherapy  Today you received the following chemotherapy agents Opdivo  To help prevent nausea and vomiting after your treatment, we encourage you to take your nausea medication as prescribed.  If you develop nausea and vomiting that is not controlled by your nausea medication, call the clinic.   BELOW ARE SYMPTOMS THAT SHOULD BE REPORTED IMMEDIATELY:  *FEVER GREATER THAN 100.5 F  *CHILLS WITH OR WITHOUT FEVER  NAUSEA AND VOMITING THAT IS NOT CONTROLLED WITH YOUR NAUSEA MEDICATION  *UNUSUAL SHORTNESS OF BREATH  *UNUSUAL BRUISING OR BLEEDING  TENDERNESS IN MOUTH AND THROAT WITH OR WITHOUT PRESENCE OF ULCERS  *URINARY PROBLEMS  *BOWEL PROBLEMS  UNUSUAL RASH Items with * indicate a potential emergency and should be followed up as soon as possible.  Feel free to call the clinic should you have any questions or concerns. The clinic phone number is [phone].  Please show the [place] at check-in to the Emergency Department and triage nurse.  Nivolumab injection (Opdivo) What is this medicine? NIVOLUMAB (nye VOL ue mab) is a monoclonal antibody. It is used to treat melanoma, lung cancer, kidney cancer, head and neck cancer, Hodgkin lymphoma, urothelial cancer, colon cancer, and liver cancer. This medicine may be used for other purposes; ask your health care provider or pharmacist if you have questions. COMMON BRAND NAME(S): Opdivo What should I tell my health care provider before I take this medicine? They need to know if you have any of these conditions: -diabetes -immune system problems -kidney disease -liver disease -lung disease -organ transplant -stomach or intestine problems -thyroid disease -an unusual or allergic reaction to nivolumab, other medicines, foods, dyes, or preservatives -pregnant or trying to get pregnant -breast-feeding How should I use this medicine? This  medicine is for infusion into a vein. It is given by a health care professional in a hospital or clinic setting. A special MedGuide will be given to you before each treatment. Be sure to read this information carefully each time. Talk to your pediatrician regarding the use of this medicine in children. While this drug may be prescribed for children as young as 12 years for selected conditions, precautions do apply. Overdosage: If you think you have taken too much of this medicine contact a poison control center or emergency room at once. NOTE: This medicine is only for you. Do not share this medicine with others. What if I miss a dose? It is important not to miss your dose. Call your doctor or health care professional if you are unable to keep an appointment. What may interact with this medicine? Interactions have not been studied. Give your health care provider a list of all the medicines, herbs, non-prescription drugs, or dietary supplements you use. Also tell them if you smoke, drink alcohol, or use illegal drugs. Some items may interact with your medicine. This list may not describe all possible interactions. Give your health care provider a list of all the medicines, herbs, non-prescription drugs, or dietary supplements you use. Also tell them if you smoke, drink alcohol, or use illegal drugs. Some items may interact with your medicine. What should I watch for while using this medicine? This drug may make you feel generally unwell. Continue your course of treatment even though you feel ill unless your doctor tells you to stop. You may need blood work done while you are taking this medicine. Do not become pregnant while taking this medicine or for 5 months after stopping  it. Women should inform their doctor if they wish to become pregnant or think they might be pregnant. There is a potential for serious side effects to an unborn child. Talk to your health care professional or pharmacist for more  information. Do not breast-feed an infant while taking this medicine or for 5 months after stopping it. What side effects may I notice from receiving this medicine? Side effects that you should report to your doctor or health care professional as soon as possible: -allergic reactions like skin rash, itching or hives, swelling of the face, lips, or tongue -breathing problems -blood in the urine -bloody or watery diarrhea or black, tarry stools -changes in emotions or moods -changes in vision -chest pain -cough -dizziness -feeling faint or lightheaded, falls -fever, chills -headache with fever, neck stiffness, confusion, loss of memory, sensitivity to light, hallucination, loss of contact with reality, or seizures -joint pain -mouth sores -redness, blistering, peeling or loosening of the skin, including inside the mouth -severe muscle pain or weakness -signs and symptoms of high blood sugar such as dizziness; dry mouth; dry skin; fruity breath; nausea; stomach pain; increased hunger or thirst; increased urination -signs and symptoms of kidney injury like trouble passing urine or change in the amount of urine -signs and symptoms of liver injury like dark yellow or brown urine; general ill feeling or flu-like symptoms; light-colored stools; loss of appetite; nausea; right upper belly pain; unusually weak or tired; yellowing of the eyes or skin -swelling of the ankles, feet, hands -trouble passing urine or change in the amount of urine -unusually weak or tired -weight gain or loss Side effects that usually do not require medical attention (report to your doctor or health care professional if they continue or are bothersome): -bone pain -constipation -decreased appetite -diarrhea -muscle pain -nausea, vomiting -tiredness This list may not describe all possible side effects. Call your doctor for medical advice about side effects. You may report side effects to FDA at 1-800-FDA-1088. Where  should I keep my medicine? This drug is given in a hospital or clinic and will not be stored at home. NOTE: This sheet is a summary. It may not cover all possible information. If you have questions about this medicine, talk to your doctor, pharmacist, or health care provider.  2019 Elsevier/Gold Standard (2018-04-05 12:55:04)

## 2018-12-08 NOTE — Telephone Encounter (Signed)
Printed calendar and avs. °

## 2018-12-08 NOTE — Progress Notes (Signed)
  [facility] OFFICE PROGRESS NOTE    Principle Diagnosis: 68 year old man with melanoma of the right shoulder diagnosed in January 2019.  He was found to have a 4 mm lesion with a T3 be ulcerated melanoma on pathology.  He had a positive margin and a right axillary lymph node involvement.   Prior Therapy:  He is status post resection in December 09, 2017 completed in [place].  He had positive margins without any lymph node sampling.  He is status post right axillary lymph node dissection completed on 10/05/2018 by Dr. Barry Dienes.  INTERVAL HISTORY:   Lee Hall returns as scheduled.  He overall is feeling well.  No nausea or vomiting.  No diarrhea.  He has an intermittent rash at the right low abdomen.  When the rash is active he puts a steroid cream on it and notes resolution.  He has intermittent discomfort in the right axillary region.  He has a good appetite.  He has noted for the past year or so that his urine is "amber" in color.  Objective:  Vital signs in last 24 hours:  Blood pressure 110/65, pulse 64, temperature 97.7 F (36.5 C), temperature source Oral, resp. rate 17, height 5\' 11"  (1.803 m), weight 184 lb 11.2 oz (83.8 kg), SpO2 98 %.    HEENT: No thrush or ulcers. Lymphatics: No palpable cervical, supraclavicular, axillary or inguinal lymph nodes. Resp: Lungs clear bilaterally. Cardio: Regular rate and rhythm. GI: Abdomen soft and nontender.  No hepatosplenomegaly. Vascular: No leg edema. Neuro: Alert and oriented. Skin: Well-healed scar at the right axilla and right upper shoulder.   Lab Results:  Lab Results  Component Value Date   WBC 6.2 12/08/2018   HGB 14.9 12/08/2018   HCT 44.7 12/08/2018   MCV 93.5 12/08/2018   PLT 143 (L) 12/08/2018   NEUTROABS 4.2 12/08/2018    Imaging:  No results found.  Medications: I have reviewed the patient's current medications.  Assessment/Plan: 1. T3b melanoma of the right shoulder with lymph node  involvement diagnosed and January 2019.   He is found to have a 4 mm lesion that is ulcerated and axillary lymph node involvement. He is status post initial excision completed in January 2019 in [place] followed by a lymph node dissection completed by Dr. Barry Dienes on October 05, 2018.  PET scan 09/04/2018 showed right axillary nodal metastasis, L1 focus of osseous hypermetabolism without CT correlate; hypermetabolic healing 90WI rib fracture presumably posttraumatic; superficial hypermetabolism left lateral midfoot. 2. Skin protection strategies.  We made a referral to Dr. Denna Haggard for follow-up. 3. Immune mediated complications.  Reviewed with him again at today's visit. 4. CNS surveillance.  Plan is for a repeat MRI of the brain with next imaging studies.         Disposition: Lee Hall appears stable.  The plan is for him to begin nivolumab therapy.  Potential toxicities again reviewed at today's visit.  He has attended the education class.  He agrees to proceed.  He will return for lab, follow-up and cycle 2 nivolumab in 1 month.  He will contact the office in the interim with any problems.  Plan reviewed with Dr. Alen Blew.    Ned Card ANP/GNP-BC   12/08/2018  11:44 AM

## 2018-12-08 NOTE — Progress Notes (Signed)
Pt PIV infiltrated @1423  , Opdivo infusion stopped, withdrew 2cc of Opdivo , PIV removed. Sandi Mealy PA to tx area to assess. Site swollen no redness noted.   New PIV started and Opdivo re-started @1440 

## 2018-12-08 NOTE — Telephone Encounter (Signed)
Printed calendar.

## 2018-12-12 ENCOUNTER — Encounter: Payer: Self-pay | Admitting: Family Medicine

## 2018-12-12 ENCOUNTER — Telehealth: Payer: Self-pay | Admitting: *Deleted

## 2018-12-12 ENCOUNTER — Other Ambulatory Visit: Payer: Self-pay

## 2018-12-12 ENCOUNTER — Ambulatory Visit (INDEPENDENT_AMBULATORY_CARE_PROVIDER_SITE_OTHER): Payer: Medicare HMO | Admitting: Family Medicine

## 2018-12-12 VITALS — BP 128/72 | HR 65 | Temp 97.9°F | Ht 71.0 in | Wt 183.0 lb

## 2018-12-12 DIAGNOSIS — J029 Acute pharyngitis, unspecified: Secondary | ICD-10-CM

## 2018-12-12 LAB — POCT RAPID STREP A (OFFICE): Rapid Strep A Screen: NEGATIVE

## 2018-12-12 LAB — CUP PACEART REMOTE DEVICE CHECK
Date Time Interrogation Session: 20200114153747
Implantable Pulse Generator Implant Date: 20190510
Pulse Gen Serial Number: 492955

## 2018-12-12 MED ORDER — LIDOCAINE VISCOUS HCL 2 % MT SOLN: 15.0000 mL | OROMUCOSAL | 0 refills | Status: DC | PRN

## 2018-12-12 NOTE — Telephone Encounter (Signed)
Please see message and advise 

## 2018-12-12 NOTE — Telephone Encounter (Signed)
Called patient and he stated that he would rather get help now instead of wait until worse tomorrow and that his pain now is an 11 on a scale of 1-10. Patient asked if you could send this prescription in for him today.

## 2018-12-12 NOTE — Telephone Encounter (Signed)
Medication has been sent to the [facility] for the patient.

## 2018-12-12 NOTE — Telephone Encounter (Addendum)
Dr Elease Hashimoto, was there another Rx medication discussed that the patient was told he could take? There was nothing mentioned on the AVS other than OTC meds.   Please advise. Thanks.   Copied from [place] [phone]. Topic: General - Other >> Dec 12, 2018 10:21 AM Carolyn Stare wrote:  Pt said he thought maybe Dr Elease Hashimoto was going to call him in something for his sore throat according to there conversation.    Pharmacy [facility]

## 2018-12-12 NOTE — Telephone Encounter (Signed)
Viscous Xylocaine 2%- 10 ml swish, gargle, and spit every 4 to 6 hours prn   100 ml.

## 2018-12-12 NOTE — Telephone Encounter (Signed)
Copied from [place] [id number]. Topic: General - Other >> Dec 12, 2018 10:18 AM Judyann Munson wrote: Reason for CRM: patient was seen today by Dr. Elease Hashimoto for a sore throat, the patient is calling to state the doctor advise him of a prescription  that the patient could take that he was unsure of name.  Advise the patient of the plan summary which states:  Treat symptomatically with fluids, ibuprofen, Tylenol, over-the-counter throat lozenges as needed.  The patient became frustrated when I advise I will send a message over  to found the name of the medication and have someone give him a call back, the patient disconnected the call.   Please advise

## 2018-12-12 NOTE — Telephone Encounter (Signed)
What we discussed was possible rx for viscous xylocaine- but our discussion was that he would give this another 24 hours and be in touch if sore throat not improving any by then.

## 2018-12-12 NOTE — Progress Notes (Signed)
   DATE: 12/08/2018      IV EXTRAVASATION (Neutral)    MD: Dr. Alen Blew  AGENT RECEIVED AT TIME OF EXTRAVASATION:   Nivolumab  IV SITE LOCATION: Left anterior forearm  INTERVENTION:  1) IV stopped  2) to ml liquid/blood aspirated from IV site.  3) Patient Teaching Instruction Sheet reviewed with Lee Hall.  A) Apply cold to area for 15 to 20 minutes 4 times daily for the next 48 hours B) Elevate the affected site for the next 48 hours. C) Return for evaluation if needed. Lee Hall was instructed to call [phone] if he has questions or notes acute changes to the area.  Review of Systems  Respiratory: Negative for chest tightness and shortness of breath.   Cardiovascular: Negative for chest pain and palpitations.  Gastrointestinal: Negative for nausea and vomiting.  Musculoskeletal: Negative for arthralgias and myalgias.  Skin:       Induration over the anterior left forearm immediately proximal to an IV infusion site.    Physical Exam Constitutional:      General: He is not in acute distress.    Appearance: Normal appearance. He is not ill-appearing.  Cardiovascular:     Rate and Rhythm: Normal rate and regular rhythm.  Pulmonary:     Effort: Pulmonary effort is normal.     Breath sounds: Normal breath sounds.  Skin:    General: Skin is warm and dry.     Comments: Induration over the left mid forearm immediately proximal to an IV insertion site.  No erythema, warmth, blisters, or skin breakdown noted.  Neurological:     Mental Status: He is alert.  Psychiatric:        Mood and Affect: Mood normal.        Behavior: Behavior normal.        Thought Content: Thought content normal.        Judgment: Judgment normal.     Lee Hall, MHS, PA-C

## 2018-12-12 NOTE — Telephone Encounter (Deleted)
Copied from [place] [phone]. Topic: General - Other >> Dec 12, 2018 10:21 AM Carolyn Stare wrote:  Pt said he thought maybe Dr Elease Hashimoto was going to call him in something for his sore throat according to there conversation.      Pharmacy [facility]

## 2018-12-12 NOTE — Patient Instructions (Signed)
Consider over the counter Chlorpheniramine 4 mg at night for any postnasal drip symptoms  May continue with the Ibuprofen as needed.   Continue to alternate warm liquids and cold/ice for symptom relieve.

## 2018-12-12 NOTE — Progress Notes (Signed)
Subjective:     Patient ID: Lee Hall, male   DOB: [birth year], 68 y.o.   MRN: [id number]  HPI Patient is seen as a work and with severe sore throat as his predominant symptom.  He has history of melanoma and just started immunotherapy with Opdivo this past Friday.  He woke up Saturday with sore throat and states this was about a 3 out of 10 and by yesterday he states this was a "15" out of 10.  Slightly better this morning.  He took some ibuprofen.  He has some chronic postnasal drip and takes Atrovent nasal regularly.  He has had some thick clear sinusitis type drainage.  Only minimal cough.  No documented fever.  Works around [employer] but no specific sick exposures  Past Medical History:  Diagnosis Date  . AICD (automatic cardioverter/defibrillator) present    [employer]  . Anxiety   . CAD (coronary artery disease)   . CAD, NATIVE VESSEL 02/18/2009   Qualifier: Diagnosis of  By: Stevie Kern NP-C, Sharyn Lull    . Encounter for long-term (current) use of high-risk medication   . Heart failure, systolic, acute on chronic ([place])   . ICD (implantable cardiac defibrillator), single, in situ   . ICD - IN SITU 01/02/2009   Qualifier: Diagnosis of  By: Peri Maris    . ISCHEMIC CARDIOMYOPATHY 03/19/2009   Qualifier: Diagnosis of  By: Boyce Medici RN, BSN, [place]    . LEFT VENTRICULAR MURAL THROMBUS 02/18/2009   Qualifier: Diagnosis of  By: Stevie Kern NP-C, Sharyn Lull    . Long term current use of aspirin   . Melanoma ([place]) 2019  . MI (myocardial infarction) ([place]) 08/2003  . PAROXYSMAL VENTRICULAR TACHYCARDIA 03/19/2009   Qualifier: Diagnosis of  By: Boyce Medici, RN, BSN, [place]    . Pre-diabetes   . SYSTOLIC HEART FAILURE, ACUTE ON CHRONIC 01/02/2009   Qualifier: Diagnosis of  By: Stevie Kern NP-C, Sharyn Lull    . TACHYCARDIA 01/02/2009   Qualifier: Diagnosis of  By: Peri Maris    . Ventricular tachycardia [facility])    Past Surgical History:  Procedure Laterality Date  . ABLATION   03/2018   IN [place]  . AXILLARY LYMPH NODE DISSECTION Right 10/05/2018   RIGHT AXILLARY LYMPH NODE DISSECTION   . AXILLARY LYMPH NODE DISSECTION Right 10/05/2018   Procedure: RIGHT AXILLARY LYMPH NODE DISSECTION;  Surgeon: Stark Klein, MD;  Location: [facility];  Service: General;  Laterality: Right;  . CARDIAC CATHETERIZATION     01/30/18 [facility]): [place]cluded LAD. Patent LCX and RCA. Normal LVEDP 14 mmHg. Medical managment.  Randolm Idol / REPLACE / REMOVE PACEMAKER    . SKIN BIOPSY  2019   melanoma R shoulder  . TONSILLECTOMY AND ADENOIDECTOMY  1969    reports that he has quit smoking. He has never used smokeless tobacco. He reports current alcohol use. He reports that he does not use drugs. family history includes Alcohol abuse in his father; Alzheimer's disease in his maternal grandmother and mother; Diabetes in his father; Kidney Stones in his father. No Known Allergies   Review of Systems  Constitutional: Positive for fatigue.  HENT: Positive for congestion and sore throat.   Respiratory: Negative for shortness of breath and wheezing.   Neurological: Negative for headaches.       Objective:   Physical Exam Constitutional:      General: He is not in acute distress.    Appearance: He is well-developed.  HENT:     Right Ear:  Tympanic membrane normal.     Left Ear: Tympanic membrane normal.     Mouth/Throat:     Tonsils: No tonsillar exudate.     Comments: Posterior pharynx erythema.  No exudate Neck:     Musculoskeletal: Neck supple.     Comments: He has some mild anterior cervical adenopathy bilaterally Cardiovascular:     Rate and Rhythm: Normal rate.  Pulmonary:     Effort: Pulmonary effort is normal.     Breath sounds: Normal breath sounds. No wheezing or rales.  Neurological:     Mental Status: He is alert.        Assessment:     Sore throat.  Probably viral.  Rule out strep (strep negative).    Plan:     -Treat symptomatically with fluids, ibuprofen,  Tylenol, over-the-counter throat lozenges as needed.  Eulas Post MD [facility]

## 2018-12-14 ENCOUNTER — Telehealth: Payer: Self-pay | Admitting: *Deleted

## 2018-12-14 ENCOUNTER — Telehealth: Payer: Self-pay | Admitting: Internal Medicine

## 2018-12-14 NOTE — Telephone Encounter (Signed)
Benadryl can cause QTc prolongation - would recommend 2nd generation antihistamine like Zyrtec, Claritin, or Allegra instead.

## 2018-12-14 NOTE — Telephone Encounter (Signed)
Call returned to Pt.  Advised as noted by pharmacist.  Pt indicates understanding.  Thanked nurse for call back.

## 2018-12-14 NOTE — Telephone Encounter (Signed)
New Message  Pt c/o medication issue:  1. Name of Medication: Benadryl   2. How are you currently taking this medication (dosage and times per day)? n/a  3. Are you having a reaction (difficulty breathing--STAT)? n/a  4. What is your medication issue? Pt states his pcp told him he could not take benadryl unless approved by Dr. Lovena Le. Please call

## 2018-12-14 NOTE — Telephone Encounter (Signed)
Spoke with patient. States he has not had any side effects from first opdivo treatment.

## 2018-12-27 ENCOUNTER — Other Ambulatory Visit: Payer: Self-pay | Admitting: Family Medicine

## 2018-12-28 ENCOUNTER — Encounter: Payer: Self-pay | Admitting: Family Medicine

## 2018-12-29 MED ORDER — EMPAGLIFLOZIN 10 MG PO TABS: 10.0000 mg | ORAL_TABLET | Freq: Every day | ORAL | 2 refills | Status: DC

## 2019-01-02 ENCOUNTER — Ambulatory Visit: Payer: Medicare HMO | Admitting: Oncology

## 2019-01-03 ENCOUNTER — Other Ambulatory Visit: Payer: Medicare HMO

## 2019-01-03 ENCOUNTER — Ambulatory Visit: Payer: Medicare HMO | Admitting: Oncology

## 2019-01-03 ENCOUNTER — Ambulatory Visit: Payer: Medicare HMO

## 2019-01-04 ENCOUNTER — Encounter: Payer: Self-pay | Admitting: Oncology

## 2019-01-04 NOTE — Progress Notes (Signed)
Called pt to introduce myself as his Arboriculturist.  Unfortunately there aren't any foundations offering copay assistance for his Dx and the type of ins he has but I offered the [employer] and went over what it covers.  He would like to apply so he will bring proof of income on 01/05/19.  If approved I will give him an expense sheet and my card for any questions or concerns he may have in the future.

## 2019-01-05 ENCOUNTER — Inpatient Hospital Stay (HOSPITAL_BASED_OUTPATIENT_CLINIC_OR_DEPARTMENT_OTHER): Payer: Medicare HMO | Admitting: Oncology

## 2019-01-05 ENCOUNTER — Inpatient Hospital Stay: Payer: Medicare HMO

## 2019-01-05 ENCOUNTER — Inpatient Hospital Stay: Payer: Medicare HMO | Attending: Oncology

## 2019-01-05 ENCOUNTER — Telehealth: Payer: Self-pay

## 2019-01-05 ENCOUNTER — Encounter: Payer: Self-pay | Admitting: Oncology

## 2019-01-05 ENCOUNTER — Telehealth: Payer: Self-pay | Admitting: *Deleted

## 2019-01-05 VITALS — BP 100/59 | HR 64 | Temp 97.8°F | Resp 17 | Ht 71.0 in | Wt 183.2 lb

## 2019-01-05 DIAGNOSIS — C4361 Malignant melanoma of right upper limb, including shoulder: Secondary | ICD-10-CM | POA: Diagnosis not present

## 2019-01-05 DIAGNOSIS — C779 Secondary and unspecified malignant neoplasm of lymph node, unspecified: Principal | ICD-10-CM

## 2019-01-05 DIAGNOSIS — Z79899 Other long term (current) drug therapy: Secondary | ICD-10-CM | POA: Insufficient documentation

## 2019-01-05 DIAGNOSIS — C773 Secondary and unspecified malignant neoplasm of axilla and upper limb lymph nodes: Secondary | ICD-10-CM | POA: Diagnosis not present

## 2019-01-05 DIAGNOSIS — C439 Malignant melanoma of skin, unspecified: Secondary | ICD-10-CM

## 2019-01-05 DIAGNOSIS — Z5112 Encounter for antineoplastic immunotherapy: Secondary | ICD-10-CM

## 2019-01-05 DIAGNOSIS — Z7982 Long term (current) use of aspirin: Secondary | ICD-10-CM

## 2019-01-05 DIAGNOSIS — Z7984 Long term (current) use of oral hypoglycemic drugs: Secondary | ICD-10-CM | POA: Insufficient documentation

## 2019-01-05 LAB — CBC WITH DIFFERENTIAL (CANCER CENTER ONLY)
Abs Immature Granulocytes: 0.01 10*3/uL (ref 0.00–0.07)
Basophils Absolute: 0 10*3/uL (ref 0.0–0.1)
Basophils Relative: 1 %
Eosinophils Absolute: 0.1 10*3/uL (ref 0.0–0.5)
Eosinophils Relative: 2 %
HCT: 42.7 % (ref 39.0–52.0)
Hemoglobin: 13.9 g/dL (ref 13.0–17.0)
Immature Granulocytes: 0 %
Lymphocytes Relative: 18 %
Lymphs Abs: 1.1 10*3/uL (ref 0.7–4.0)
MCH: 30 pg (ref 26.0–34.0)
MCHC: 32.6 g/dL (ref 30.0–36.0)
MCV: 92.2 fL (ref 80.0–100.0)
Monocytes Absolute: 0.6 10*3/uL (ref 0.1–1.0)
Monocytes Relative: 10 %
NEUTROS PCT: 69 %
Neutro Abs: 3.9 10*3/uL (ref 1.7–7.7)
Platelet Count: 151 10*3/uL (ref 150–400)
RBC: 4.63 MIL/uL (ref 4.22–5.81)
RDW: 13.5 % (ref 11.5–15.5)
WBC Count: 5.7 10*3/uL (ref 4.0–10.5)
nRBC: 0 % (ref 0.0–0.2)

## 2019-01-05 LAB — CMP (CANCER CENTER ONLY)
ALT: 23 U/L (ref 0–44)
ANION GAP: 9 (ref 5–15)
AST: 22 U/L (ref 15–41)
Albumin: 3.7 g/dL (ref 3.5–5.0)
Alkaline Phosphatase: 71 U/L (ref 38–126)
BUN: 19 mg/dL (ref 8–23)
CO2: 23 mmol/L (ref 22–32)
Calcium: 9.1 mg/dL (ref 8.9–10.3)
Chloride: 109 mmol/L (ref 98–111)
Creatinine: 1.03 mg/dL (ref 0.61–1.24)
GFR, Estimated: >60 mL/min (ref >60)
Glucose, Bld: 135 mg/dL — ABNORMAL HIGH (ref 70–99)
Potassium: 4.3 mmol/L (ref 3.5–5.1)
SODIUM: 141 mmol/L (ref 135–145)
Total Bilirubin: 0.6 mg/dL (ref 0.3–1.2)
Total Protein: 6.7 g/dL (ref 6.5–8.1)

## 2019-01-05 LAB — TSH: TSH: 3.092 u[IU]/mL (ref 0.320–4.118)

## 2019-01-05 MED ORDER — SODIUM CHLORIDE 0.9 % IV SOLN
Freq: Once | INTRAVENOUS | Status: AC
  Administered 2019-01-05: 11:00:00 via INTRAVENOUS
  Filled 2019-01-05: qty 250

## 2019-01-05 MED ORDER — SODIUM CHLORIDE 0.9 % IV SOLN
480.0000 mg | Freq: Once | INTRAVENOUS | Status: AC
  Administered 2019-01-05: 480 mg via INTRAVENOUS
  Filled 2019-01-05: qty 48

## 2019-01-05 NOTE — Progress Notes (Signed)
Met with patient whom brought proof of income for one-time $700 [place].  Patient approved for the grant and signed approval letter. He has a copy of the letter and expense sheet along with the Outpatient Pharmacy information. Went over in detail expenses and how they are covered. He verbalized understanding. He received a gas card today from the grant.

## 2019-01-05 NOTE — Patient Instructions (Signed)
[facility] Discharge Instructions for Patients Receiving Chemotherapy  Today you received the following chemotherapy agents Opdivo  To help prevent nausea and vomiting after your treatment, we encourage you to take your nausea medication as directed   If you develop nausea and vomiting that is not controlled by your nausea medication, call the clinic.   BELOW ARE SYMPTOMS THAT SHOULD BE REPORTED IMMEDIATELY:  *FEVER GREATER THAN 100.5 F  *CHILLS WITH OR WITHOUT FEVER  NAUSEA AND VOMITING THAT IS NOT CONTROLLED WITH YOUR NAUSEA MEDICATION  *UNUSUAL SHORTNESS OF BREATH  *UNUSUAL BRUISING OR BLEEDING  TENDERNESS IN MOUTH AND THROAT WITH OR WITHOUT PRESENCE OF ULCERS  *URINARY PROBLEMS  *BOWEL PROBLEMS  UNUSUAL RASH Items with * indicate a potential emergency and should be followed up as soon as possible.  Feel free to call the clinic should you have any questions or concerns. The clinic phone number is [phone].  Please show the CHEMO ALERT CARD at check-in to the Emergency Department and triage nurse.   

## 2019-01-05 NOTE — Progress Notes (Signed)
Hematology and Oncology Follow Up Visit  Lee Hall [id number] 1951/05/11 68 y.o. 01/05/2019 9:45 AM Lee Hall, MDKoberlein, Lee Berg, MD   Principle Diagnosis: 68 year old man with T3 N3 melanoma of the right shoulder diagnosed in January 2019.  He was found to have a 4 mm ulcerated melanoma, BRAF positive with 4 out of 28 lymph nodes involved in the right axilla.   Prior Therapy:  He is status post resection in December 09, 2017 completed in [place].  He had positive margins without any lymph node sampling.  He is status post right axillary lymph node dissection completed on 10/05/2018 by Dr. Barry Dienes.  Final pathology revealed 4 out of 28 lymph node involvement.   Current therapy: Nivolumab 480 milligrams monthly started in January 2020 for adjuvant purposes.  He is here for cycle 2 of therapy.  Interim History: Lee Hall returns today for a follow-up visit.  The last visit, he received the first cycle of nivolumab without any major complications.  He denies any nausea, fatigue or dermatological changes.  He has noticed significant improvement in his range of motion in his arm and shoulder with the mild adenopathy noted.  He has resumed all activities of daily living with improvement in his quality of life.    Patient denied any alteration mental status, neuropathy, confusion or dizziness.  Denies any headaches or lethargy.  Denies any night sweats, weight loss or changes in appetite.  Denied orthopnea, dyspnea on exertion or chest discomfort.  Denies shortness of breath, difficulty breathing hemoptysis or cough.  Denies any abdominal distention, nausea, early satiety or dyspepsia.  Denies any hematuria, frequency, dysuria or nocturia.  Denies any skin irritation, dryness or rash.  Denies any ecchymosis or petechiae.  Denies any lymphadenopathy or clotting.  Denies any heat or cold intolerance.  Denies any anxiety or depression.  Remaining review of system is  negative.       Medications: I have reviewed the patient's current medications.  Current Outpatient Medications  Medication Sig Dispense Refill  . acyclovir (ZOVIRAX) 800 MG tablet Take 400 mg by mouth See admin instructions. Take 400 mg by mouth in the evening every other day    . amiodarone (PACERONE) 200 MG tablet Take 1 tablet (200 mg total) by mouth daily. 90 tablet 3  . aspirin 81 MG tablet Take 81 mg by mouth daily.      . carvedilol (COREG) 6.25 MG tablet Take 1 tablet (6.25 mg total) by mouth 2 (two) times daily with a meal. 180 tablet 3  . empagliflozin (JARDIANCE) 10 MG TABS tablet Take 10 mg by mouth daily. 30 tablet 2  . ipratropium (ATROVENT) 0.06 % nasal spray Place 2 sprays into both nostrils 3 (three) times daily. 90 mL 3  . lidocaine (XYLOCAINE) 2 % solution Use as directed 15 mLs in the mouth or throat as needed for mouth pain. Swish, gargle, and spit every 4 to 6 hours prn 100 mL 0  . metFORMIN (GLUCOPHAGE) 500 MG tablet Take 500 mg by mouth 2 (two) times daily with a meal.     . mexiletine (MEXITIL) 150 MG capsule Take 150 mg by mouth 3 (three) times daily.    . rosuvastatin (CRESTOR) 10 MG tablet Take 10 mg by mouth every evening.     . sacubitril-valsartan (ENTRESTO) 24-26 MG Take 1 tablet by mouth 2 (two) times daily. 60 tablet 11   No current facility-administered medications for this visit.      Allergies: No  Known Allergies  Past Medical History, Surgical history, Social history, and Family History were reviewed and updated.  Marland Kitchen  Physical Exam: Blood pressure (!) 100/59, pulse 64, temperature 97.8 F (36.6 C), temperature source Oral, resp. rate 17, height _0  (1.803 m), weight 183 lb 3.2 oz (83.1 kg), SpO2 97 %.    ECOG: 0   General appearance: Comfortable appearing without any discomfort Head: Normocephalic without any trauma Oropharynx: Mucous membranes are moist and pink without any thrush or ulcers. Eyes: Pupils are equal and round reactive  to light. Lymph nodes: No cervical, supraclavicular, inguinal or axillary lymphadenopathy.   Heart:regular rate and rhythm.  S1 and S2 without leg edema. Lung: Clear without any rhonchi or wheezes.  No dullness to percussion. Abdomin: Soft, nontender, nondistended with good bowel sounds.  No hepatosplenomegaly. Musculoskeletal: No joint deformity or effusion.  Full range of motion noted. Neurological: No deficits noted on motor, sensory and deep tendon reflex exam. Skin: No petechial rash or dryness.  Appeared moist.  Psychiatric: Mood and affect appeared appropriate.      Lab Results: Lab Results  Component Value Date   WBC 6.2 12/08/2018   HGB 14.9 12/08/2018   HCT 44.7 12/08/2018   MCV 93.5 12/08/2018   PLT 143 (L) 12/08/2018     Chemistry      Component Value Date/Time   NA 140 12/08/2018 1047   NA 141 08/14/2018 1405   K 4.4 12/08/2018 1047   CL 107 12/08/2018 1047   CO2 24 12/08/2018 1047   BUN 18 12/08/2018 1047   BUN 19 08/14/2018 1405   CREATININE 1.13 12/08/2018 1047      Component Value Date/Time   CALCIUM 9.4 12/08/2018 1047   ALKPHOS 72 12/08/2018 1047   AST 19 12/08/2018 1047   ALT 27 12/08/2018 1047   BILITOT 0.6 12/08/2018 1047     IMPRESSION: 1. Right axillary nodal metastasis. 2. L1 focus of osseous hypermetabolism is without CT correlate. Consider dedicated pre and post-contrast lumbar spine MRI. 3. Hypermetabolic healing eleventh left rib fracture is presumably posttraumatic. Correlate with prior trauma. If no prior trauma, isolated osseous metastasis with pathologic fracture would be a concern. 4. Superficial hypermetabolism about the left lateral mid foot is indeterminate but warrants physical exam correlation.    Impression and Plan:  68 year old man with:  1.    Cutaneous melanoma of the right shoulder diagnosed in January 2019.  He was found to have T3b N3 at her surgical resection and right axillary lymph node surgery completed  November 2019.  He is currently receiving adjuvant nivolumab started in January 2020 and is here for evaluation for the second cycle.  Risks and benefits of continuing this therapy and additional treatment options were also reviewed.  These options include combined immunotherapy agents, BRAF targeted agents as well as chemotherapy.  At this time we decided to continue with the nivolumab and update his staging work-up in the next 4 weeks.    2.    Dermatology surveillance: Referral to [facility] is been made to establish care there.  3.  Immune mediated complications: I continue to educate him about long term complication associated with this therapy.  This would include thyroid disease, dermatitis, colitis and pneumonitis.  4.  CNS surveillance: CT scan of the brain will be done with the next staging work-up.   5.  Follow-up: We will be in 4 weeks for the next cycle of nivolumab.  25  minutes was spent with the  patient face-to-face today.  More than 50% of time was dedicated to reviewing his disease status, pathology reports, treatment options and answering questions regarding future plan of care.   Zola Button, MD 2/7/20209:45 AM

## 2019-01-05 NOTE — Telephone Encounter (Signed)
Printed avs and calender of upcoming appointment. Per 2/7 los

## 2019-01-05 NOTE — Telephone Encounter (Signed)
Spoke with patient regarding [employer] alert for possible CHF symptoms. Score is actually improved as of 12/29/18, but no transmission data available since then.  Patient denies any recent LE edema, abdominal tightness/bloating, ShOB, or weight gain. Completed second round of IV medication at oncologist's office today ("immune-booster" per patient). Not currently taking diuretic. Advised I will review with Dr. Lovena Le next week. ED precautions given for any worsening CHF symptoms. Patient verbalizes understanding and denies questions at this time.  Scheduled repeat transmission for Monday, 01/08/19.

## 2019-01-08 NOTE — Telephone Encounter (Signed)
HeartLogic score improved to 6 as of 01/08/19.

## 2019-01-09 NOTE — Telephone Encounter (Signed)
Per Dr. Lovena Le, no medication changes recommended at this time. Plan to advise patient to reduce sodium intake. Will make patient aware of recommendations.

## 2019-01-10 ENCOUNTER — Encounter: Payer: Self-pay | Admitting: Oncology

## 2019-01-10 NOTE — Telephone Encounter (Signed)
Spoke with patient, advised of Dr. Tanna Furry recommendations. Answered patient's questions regarding salt intake and fluid retention. He is aware of upcoming appointment on 01/19/19 and denies additional questions or concerns at this time.

## 2019-01-11 ENCOUNTER — Telehealth: Payer: Self-pay | Admitting: *Deleted

## 2019-01-11 NOTE — Telephone Encounter (Signed)
Patient has question OM:AYOKHT. Placed on dr shadad's desk for review.

## 2019-01-15 ENCOUNTER — Ambulatory Visit (INDEPENDENT_AMBULATORY_CARE_PROVIDER_SITE_OTHER): Payer: Medicare HMO

## 2019-01-15 DIAGNOSIS — I255 Ischemic cardiomyopathy: Secondary | ICD-10-CM | POA: Diagnosis not present

## 2019-01-16 ENCOUNTER — Encounter: Payer: Self-pay | Admitting: Family Medicine

## 2019-01-16 LAB — CUP PACEART REMOTE DEVICE CHECK
Date Time Interrogation Session: 20200218175259
Implantable Pulse Generator Implant Date: 20190510
Pulse Gen Serial Number: 492955

## 2019-01-17 NOTE — Telephone Encounter (Signed)
We have an acute slot this afternoon still if he is interested in evaluation and we can complete CXR if needed at that time.

## 2019-01-18 NOTE — Telephone Encounter (Signed)
Slot had already been taken. Attempting to schedule another appointment with the patient.  ATC "call can not be completed at this time."  CRM created.

## 2019-01-19 ENCOUNTER — Encounter: Payer: Medicare HMO | Admitting: Internal Medicine

## 2019-01-19 ENCOUNTER — Ambulatory Visit (INDEPENDENT_AMBULATORY_CARE_PROVIDER_SITE_OTHER): Payer: Medicare HMO

## 2019-01-19 ENCOUNTER — Ambulatory Visit (INDEPENDENT_AMBULATORY_CARE_PROVIDER_SITE_OTHER): Payer: Medicare HMO | Admitting: Family Medicine

## 2019-01-19 ENCOUNTER — Encounter: Payer: Self-pay | Admitting: Family Medicine

## 2019-01-19 VITALS — BP 110/60 | HR 70 | Wt 179.4 lb

## 2019-01-19 DIAGNOSIS — R059 Cough, unspecified: Secondary | ICD-10-CM

## 2019-01-19 DIAGNOSIS — I5022 Chronic systolic (congestive) heart failure: Secondary | ICD-10-CM | POA: Diagnosis not present

## 2019-01-19 DIAGNOSIS — E118 Type 2 diabetes mellitus with unspecified complications: Secondary | ICD-10-CM | POA: Diagnosis not present

## 2019-01-19 DIAGNOSIS — R05 Cough: Secondary | ICD-10-CM

## 2019-01-19 DIAGNOSIS — Z794 Long term (current) use of insulin: Secondary | ICD-10-CM

## 2019-01-19 MED ORDER — DOXYCYCLINE HYCLATE 100 MG PO TABS: 100.0000 mg | ORAL_TABLET | Freq: Two times a day (BID) | ORAL | 0 refills | Status: AC

## 2019-01-19 MED ORDER — BENZONATATE 100 MG PO CAPS: 100.0000 mg | ORAL_CAPSULE | Freq: Three times a day (TID) | ORAL | 0 refills | Status: DC | PRN

## 2019-01-19 NOTE — Progress Notes (Signed)
Lee Hall DOB: 1951/05/27 Encounter date: 01/19/2019  This is a 68 y.o. male who presents with Chief Complaint  Patient presents with  . Cough    x 10 days, ribs are sore, productive cough, fatigue, taking mucinex dm,     History of present illness:  Second Friday in Jan started with cancer immune therapy tx. 4-5 days later started with sore throat, drainage. This went away 7-10 days later. First Friday in Feb and 4-5 days later started with everything again without sore throat. Significant sinus drainage. Hacking so hard that ribs are sore and spastic type cough and feels like there is gurgling sound when he is taking in larger breath.   Has been taking mucinex DM 1 every 24 hours. Really didn't make much difference for him in terms of overall sx and cough.   Has to keep cough drop in mouth or something to suck on or he starts with coughing spasm.   Had hard time getting appointment here for sick symptoms.   Today has been least hacking day. This morning took 2 of the mucinex DM which has made notable difference. When hacking it isn't constant. Has had a little break in the cough at this point.   Having even to sleep with something in mouth to prevent coughing spasm.   When he coughs something up it is clear mucous. No sore throat, no headache, no fevers, no chills. He would like cxr because his research is that xr is best way to see pneumonia/bronchitis.    blood sugar has been running about 120's fasting.   No Known Allergies Current Meds  Medication Sig  . acyclovir (ZOVIRAX) 800 MG tablet Take 400 mg by mouth See admin instructions. Take 400 mg by mouth in the evening every other day  . amiodarone (PACERONE) 200 MG tablet Take 1 tablet (200 mg total) by mouth daily.  Marland Kitchen aspirin 81 MG tablet Take 81 mg by mouth daily.    . carvedilol (COREG) 6.25 MG tablet Take 1 tablet (6.25 mg total) by mouth 2 (two) times daily with a meal.  . empagliflozin (JARDIANCE) 10 MG  TABS tablet Take 10 mg by mouth daily.  Marland Kitchen ipratropium (ATROVENT) 0.06 % nasal spray Place 2 sprays into both nostrils 3 (three) times daily.  Marland Kitchen lidocaine (XYLOCAINE) 2 % solution Use as directed 15 mLs in the mouth or throat as needed for mouth pain. Swish, gargle, and spit every 4 to 6 hours prn  . metFORMIN (GLUCOPHAGE) 500 MG tablet Take 500 mg by mouth 2 (two) times daily with a meal.   . mexiletine (MEXITIL) 150 MG capsule Take 150 mg by mouth 3 (three) times daily.  . rosuvastatin (CRESTOR) 10 MG tablet Take 10 mg by mouth every evening.   . sacubitril-valsartan (ENTRESTO) 24-26 MG Take 1 tablet by mouth 2 (two) times daily.    Review of Systems  Constitutional: Negative for chills and fever.  HENT: Positive for congestion and rhinorrhea (has tried to increase ipratropium but this didn't help). Negative for sinus pressure, sinus pain and sore throat.   Respiratory: Positive for cough and shortness of breath (difficulty getting in normal breath when lying on back. ).   Cardiovascular: Chest pain: rib pain with cough.    Objective:  BP 110/60 (BP Location: Left Arm, Patient Position: Sitting, Cuff Size: Normal)   Pulse 70   Wt 179 lb 6.4 oz (81.4 kg)   SpO2 96%   BMI 25.02 kg/m   Weight:  179 lb 6.4 oz (81.4 kg)   BP Readings from Last 3 Encounters:  01/19/19 110/60  01/05/19 (!) 100/59  12/12/18 128/72   Wt Readings from Last 3 Encounters:  01/19/19 179 lb 6.4 oz (81.4 kg)  01/05/19 183 lb 3.2 oz (83.1 kg)  12/12/18 183 lb (83 kg)    Physical Exam Constitutional:      General: He is not in acute distress.    Appearance: He is well-developed.  HENT:     Head: Normocephalic and atraumatic.     Right Ear: Tympanic membrane, ear canal and external ear normal.     Left Ear: Tympanic membrane, ear canal and external ear normal.     Nose:     Right Turbinates: Enlarged.     Left Turbinates: Enlarged.     Mouth/Throat:     Mouth: Mucous membranes are moist.     Pharynx:  Posterior oropharyngeal erythema (slight) present.     Tonsils: No tonsillar exudate.  Cardiovascular:     Rate and Rhythm: Normal rate and regular rhythm.     Heart sounds: Murmur present. Systolic murmur present with a grade of 2/6. No friction rub.  Pulmonary:     Effort: Pulmonary effort is normal. No respiratory distress.     Breath sounds: Decreased air movement (slight at bases) present. Examination of the right-lower field reveals rales. Examination of the left-lower field reveals rales. Rales present. No wheezing.  Musculoskeletal:     Right lower leg: No edema.     Left lower leg: No edema.  Lymphadenopathy:     Cervical: No cervical adenopathy.  Neurological:     Mental Status: He is alert and oriented to person, place, and time.  Psychiatric:        Behavior: Behavior normal.     Assessment/Plan 1. Cough Concern for progressing cough. CXR resulted prior to note completion and there is pneumonia. Doxycycline was given in office. Will await labwork for complete assessment. I will check in with him on symptoms once we get the labwork back. - DG Chest 2 View; Future - benzonatate (TESSALON PERLES) 100 MG capsule; Take 1 capsule (100 mg total) by mouth 3 (three) times daily as needed for cough.  Dispense: 20 capsule; Refill: 0 - doxycycline (VIBRA-TABS) 100 MG tablet; Take 1 tablet (100 mg total) by mouth 2 (two) times daily for 7 days.  Dispense: 14 tablet; Refill: 0 - Brain natriuretic peptide; Future - Comprehensive metabolic panel; Future - CBC with Differential/Platelet; Future - CBC with Differential/Platelet - Comprehensive metabolic panel - Brain natriuretic peptide  2. Controlled type 2 diabetes mellitus with complication, with long-term current use of insulin (HCC) Sugars have been in much better range and doing well with med change to jardiance. Will recheck A1C to see where baseline is at this point. - Hemoglobin A1c; Future - Hemoglobin A1c    Return if  symptoms worsen or fail to improve.    Micheline Rough, MD

## 2019-01-20 LAB — HEMOGLOBIN A1C
Hgb A1c MFr Bld: 7 % of total Hgb — ABNORMAL HIGH (ref <5.7)
MEAN PLASMA GLUCOSE: 154 (calc)
eAG (mmol/L): 8.5 (calc)

## 2019-01-20 LAB — CBC WITH DIFFERENTIAL/PLATELET
Absolute Monocytes: 840 cells/uL (ref 200–950)
Basophils Absolute: 21 cells/uL (ref 0–200)
Basophils Relative: 0.3 %
Eosinophils Absolute: 168 cells/uL (ref 15–500)
Eosinophils Relative: 2.4 %
HCT: 42.1 % (ref 38.5–50.0)
Hemoglobin: 13.7 g/dL (ref 13.2–17.1)
Lymphs Abs: 1141 cells/uL (ref 850–3900)
MCH: 29.1 pg (ref 27.0–33.0)
MCHC: 32.5 g/dL (ref 32.0–36.0)
MCV: 89.6 fL (ref 80.0–100.0)
MPV: 12.2 fL (ref 7.5–12.5)
Monocytes Relative: 12 %
Neutro Abs: 4830 cells/uL (ref 1500–7800)
Neutrophils Relative %: 69 %
PLATELETS: 223 10*3/uL (ref 140–400)
RBC: 4.7 10*6/uL (ref 4.20–5.80)
RDW: 13 % (ref 11.0–15.0)
Total Lymphocyte: 16.3 %
WBC: 7 10*3/uL (ref 3.8–10.8)

## 2019-01-20 LAB — COMPREHENSIVE METABOLIC PANEL
AG Ratio: 1.4 (calc) (ref 1.0–2.5)
ALT: 20 U/L (ref 9–46)
AST: 16 U/L (ref 10–35)
Albumin: 3.9 g/dL (ref 3.6–5.1)
Alkaline phosphatase (APISO): 75 U/L (ref 35–144)
BUN: 21 mg/dL (ref 7–25)
CO2: 26 mmol/L (ref 20–32)
Calcium: 8.9 mg/dL (ref 8.6–10.3)
Chloride: 105 mmol/L (ref 98–110)
Creat: 1.07 mg/dL (ref 0.70–1.25)
GLUCOSE: 119 mg/dL — AB (ref 65–99)
Globulin: 2.7 g/dL (calc) (ref 1.9–3.7)
Potassium: 4.7 mmol/L (ref 3.5–5.3)
SODIUM: 140 mmol/L (ref 135–146)
TOTAL PROTEIN: 6.6 g/dL (ref 6.1–8.1)
Total Bilirubin: 0.4 mg/dL (ref 0.2–1.2)

## 2019-01-20 LAB — BRAIN NATRIURETIC PEPTIDE: Brain Natriuretic Peptide: 782 pg/mL — ABNORMAL HIGH (ref <100)

## 2019-01-22 NOTE — Telephone Encounter (Signed)
Message with update was received this morning. --- I do feel 40% better overall... cough and breathing 40% better however gets me up & down several times nightly. Best when sleeping in recliner . ---

## 2019-01-22 NOTE — Telephone Encounter (Signed)
Please advise. Pt has not called back to make an appointment. I have sent a MyChart message asking him to call at his convenience.

## 2019-01-22 NOTE — Telephone Encounter (Signed)
We saw him on Friday so these mychart messages are old. I had already sent message to check in with him today so if you have not reached him yet see if we can consolidate these messages so there is less confusion. Thanks!

## 2019-01-23 ENCOUNTER — Ambulatory Visit: Payer: Medicare HMO | Admitting: Internal Medicine

## 2019-01-23 ENCOUNTER — Encounter: Payer: Self-pay | Admitting: Internal Medicine

## 2019-01-23 ENCOUNTER — Encounter: Payer: Self-pay | Admitting: Family Medicine

## 2019-01-23 ENCOUNTER — Telehealth: Payer: Self-pay | Admitting: *Deleted

## 2019-01-23 ENCOUNTER — Encounter: Payer: Self-pay | Admitting: Oncology

## 2019-01-23 VITALS — BP 102/64 | HR 69 | Ht 71.0 in | Wt 179.6 lb

## 2019-01-23 DIAGNOSIS — I472 Ventricular tachycardia, unspecified: Secondary | ICD-10-CM

## 2019-01-23 DIAGNOSIS — Z95810 Presence of automatic (implantable) cardiac defibrillator: Secondary | ICD-10-CM

## 2019-01-23 DIAGNOSIS — I5022 Chronic systolic (congestive) heart failure: Secondary | ICD-10-CM

## 2019-01-23 DIAGNOSIS — Z85820 Personal history of malignant melanoma of skin: Secondary | ICD-10-CM

## 2019-01-23 DIAGNOSIS — I255 Ischemic cardiomyopathy: Secondary | ICD-10-CM | POA: Diagnosis not present

## 2019-01-23 LAB — CUP PACEART INCLINIC DEVICE CHECK
Date Time Interrogation Session: 20200225050000
HighPow Impedance: 50 Ohm
Implantable Pulse Generator Implant Date: 20190510
Lead Channel Impedance Value: 3000 Ohm
Lead Channel Impedance Value: 392 Ohm
Lead Channel Impedance Value: 567 Ohm
Lead Channel Pacing Threshold Amplitude: 0.7 V
Lead Channel Pacing Threshold Pulse Width: 0.4 ms
Lead Channel Pacing Threshold Pulse Width: 0.4 ms
Lead Channel Sensing Intrinsic Amplitude: 10.9 mV
Lead Channel Sensing Intrinsic Amplitude: 3.9 mV
Lead Channel Setting Pacing Amplitude: 0.1 V
Lead Channel Setting Pacing Amplitude: 2 V
Lead Channel Setting Pacing Amplitude: 2 V
Lead Channel Setting Pacing Pulse Width: 0.1 ms
Lead Channel Setting Pacing Pulse Width: 0.4 ms
Lead Channel Setting Sensing Sensitivity: 0.6 mV
Lead Channel Setting Sensing Sensitivity: 1 mV
MDC IDC MSMT LEADCHNL RV PACING THRESHOLD AMPLITUDE: 1 V
Pulse Gen Serial Number: 492955

## 2019-01-23 MED ORDER — MEXILETINE HCL 150 MG PO CAPS: 150.0000 mg | ORAL_CAPSULE | Freq: Three times a day (TID) | ORAL | 3 refills | Status: DC

## 2019-01-23 NOTE — Telephone Encounter (Signed)
Spoke with patient. Per dr Lee Hall, okay to move CT scan 2 weeks out d/t patient being treated for pneumonia now.contacted central scheduling. New day and time is march 12th @ 10:45. Patient wrote down instructions.

## 2019-01-23 NOTE — Patient Instructions (Signed)
Medication Instructions:  Your physician recommends that you continue on your current medications as directed. Please refer to the Current Medication list given to you today.  Labwork: None ordered.  Testing/Procedures: None ordered.  Follow-Up: Your physician wants you to follow-up in: one year with Dr. Lovena Le.   You will receive a reminder letter in the mail two months in advance. If you don't receive a letter, please call our office to schedule the follow-up appointment.  Remote monitoring is used to monitor your ICD from home. This monitoring reduces the number of office visits required to check your device to one time per year. It allows [place] to keep an eye on the functioning of your device to ensure it is working properly. You are scheduled for a device check from home on 04/24/2019. You may send your transmission at any time that day. If you have a wireless device, the transmission will be sent automatically. After your physician reviews your transmission, you will receive a postcard with your next transmission date.  Any Other Special Instructions Will Be Listed Below (If Applicable).  If you need a refill on your cardiac medications before your next appointment, please call your pharmacy.

## 2019-01-23 NOTE — Progress Notes (Signed)
HPI [name] returns today after a long absence from my clinic. He is a 68 yo man with a h/o VT, PVC's, CHF, ICM, s/p MI with known chronically occluded LAD. He has undergone recent ICD gen change. He was on sotalol in the past for VT but this became ineffective and he has been switched to amiodarone. He has a remote h/o thyroid dysfunction on amio. The patient's VT has been controlled on a combination of mexitil and amio. He notes that he was diagnosed with melanoma in [place] and is undergoing immune therapy. He has been bothered by an URI.   No Known Allergies   Current Outpatient Medications  Medication Sig Dispense Refill  . acyclovir (ZOVIRAX) 800 MG tablet Take 400 mg by mouth See admin instructions. Take 400 mg by mouth in the evening every other day    . amiodarone (PACERONE) 200 MG tablet Take 1 tablet (200 mg total) by mouth daily. 90 tablet 3  . aspirin 81 MG tablet Take 81 mg by mouth daily.      . benzonatate (TESSALON PERLES) 100 MG capsule Take 1 capsule (100 mg total) by mouth 3 (three) times daily as needed for cough. 20 capsule 0  . carvedilol (COREG) 6.25 MG tablet Take 1 tablet (6.25 mg total) by mouth 2 (two) times daily with a meal. 180 tablet 3  . doxycycline (VIBRA-TABS) 100 MG tablet Take 1 tablet (100 mg total) by mouth 2 (two) times daily for 7 days. 14 tablet 0  . empagliflozin (JARDIANCE) 10 MG TABS tablet Take 10 mg by mouth daily. 30 tablet 2  . ipratropium (ATROVENT) 0.06 % nasal spray Place 2 sprays into both nostrils 3 (three) times daily. 90 mL 3  . metFORMIN (GLUCOPHAGE-XR) 500 MG 24 hr tablet Take 1 tablet by mouth 2 (two) times daily.    Marland Kitchen mexiletine (MEXITIL) 150 MG capsule Take 150 mg by mouth 3 (three) times daily.    . rosuvastatin (CRESTOR) 10 MG tablet Take 10 mg by mouth every evening.     . sacubitril-valsartan (ENTRESTO) 24-26 MG Take 1 tablet by mouth 2 (two) times daily. 60 tablet 11   No current facility-administered medications  for this visit.      Past Medical History:  Diagnosis Date  . AICD (automatic cardioverter/defibrillator) present    [employer]  . Anxiety   . CAD (coronary artery disease)   . CAD, NATIVE VESSEL 02/18/2009   Qualifier: Diagnosis of  By: Stevie Kern NP-C, Sharyn Lull    . Encounter for long-term (current) use of high-risk medication   . Heart failure, systolic, acute on chronic ([place])   . ICD (implantable cardiac defibrillator), single, in situ   . ICD - IN SITU 01/02/2009   Qualifier: Diagnosis of  By: Peri Maris    . ISCHEMIC CARDIOMYOPATHY 03/19/2009   Qualifier: Diagnosis of  By: Boyce Medici RN, BSN, [place]    . LEFT VENTRICULAR MURAL THROMBUS 02/18/2009   Qualifier: Diagnosis of  By: Stevie Kern NP-C, Sharyn Lull    . Long term current use of aspirin   . Melanoma ([place]) 2019  . MI (myocardial infarction) ([place]) 08/2003  . PAROXYSMAL VENTRICULAR TACHYCARDIA 03/19/2009   Qualifier: Diagnosis of  By: Boyce Medici, RN, BSN, [place]    . Pre-diabetes   . SYSTOLIC HEART FAILURE, ACUTE ON CHRONIC 01/02/2009   Qualifier: Diagnosis of  By: Stevie Kern NP-C, Sharyn Lull    . TACHYCARDIA 01/02/2009   Qualifier: Diagnosis of  By: Peri Maris    .  Ventricular tachycardia ([place])     ROS:   All systems reviewed and negative except as noted in the HPI.   Past Surgical History:  Procedure Laterality Date  . ABLATION  03/2018   IN [place]  . AXILLARY LYMPH NODE DISSECTION Right 10/05/2018   RIGHT AXILLARY LYMPH NODE DISSECTION   . AXILLARY LYMPH NODE DISSECTION Right 10/05/2018   Procedure: RIGHT AXILLARY LYMPH NODE DISSECTION;  Surgeon: Stark Klein, MD;  Location: [place];  Service: General;  Laterality: Right;  . CARDIAC CATHETERIZATION     01/30/18 [facility]): Occluded LAD. Patent LCX and RCA. Normal LVEDP 14 mmHg. Medical managment.  Randolm Idol / REPLACE / REMOVE PACEMAKER    . SKIN BIOPSY  2019   melanoma R shoulder  . TONSILLECTOMY AND ADENOIDECTOMY  1969     Family History    Problem Relation Age of Onset  . Alzheimer's disease Mother   . Kidney Stones Father   . Diabetes Father   . Alcohol abuse Father   . Alzheimer's disease Maternal Grandmother      Social History   Socioeconomic History  . Marital status: Single    Spouse name: Not on file  . Number of children: Not on file  . Years of education: Not on file  . Highest education level: Not on file  Occupational History  . Occupation: Unemployed since 10/2008  Social Needs  . Financial resource strain: Not on file  . Food insecurity:    Worry: Not on file    Inability: Not on file  . Transportation needs:    Medical: Not on file    Non-medical: Not on file  Tobacco Use  . Smoking status: Former Research scientist (life sciences)  . Smokeless tobacco: Never Used  Substance and Sexual Activity  . Alcohol use: Yes    Comment: 3 x week  . Drug use: No  . Sexual activity: Not Currently  Lifestyle  . Physical activity:    Days per week: Not on file    Minutes per session: Not on file  . Stress: Not on file  Relationships  . Social connections:    Talks on phone: Not on file    Gets together: Not on file    Attends religious service: Not on file    Active member of club or organization: Not on file    Attends meetings of clubs or organizations: Not on file    Relationship status: Not on file  . Intimate partner violence:    Fear of current or ex partner: Not on file    Emotionally abused: Not on file    Physically abused: Not on file    Forced sexual activity: Not on file  Other Topics Concern  . Not on file  Social History Narrative   Single   Lives with sister   No regular exercise     BP 102/64   Pulse 69   Ht 5\' 11"  (1.803 m)   Wt 179 lb 9.6 oz (81.5 kg)   SpO2 95%   BMI 25.05 kg/m   Physical Exam:  Well appearing NAD HEENT: Unremarkable Neck:  No JVD, no thyromegally Lymphatics:  No adenopathy Back:  No CVA tenderness Lungs:  Clear with no wheezes HEART:  Regular rate rhythm, no  murmurs, no rubs, no clicks Abd:  soft, positive bowel sounds, no organomegally, no rebound, no guarding Ext:  2 plus pulses, no edema, no cyanosis, no clubbing Skin:  No rashes no nodules Neuro:  CN II  through XII intact, motor grossly intact  EKG -none  DEVICE  Normal device function.  See PaceArt for details.   Assess/Plan: 1. VT - he is well controlled on mexitil. Continue 2. Chronic systolic heart failure - his symptoms have worsened with his URI. I encouraged him to maintain a low sodium diet. 3. CAD - he denies anginal symptoms. No change in his meds. 4. HTN - his bp is well controlled. No change in meds. We discussed a low sodium diet.  Mikle Bosworth.D.

## 2019-01-24 NOTE — Progress Notes (Signed)
Remote ICD transmission.   

## 2019-01-26 ENCOUNTER — Telehealth: Payer: Self-pay

## 2019-01-26 ENCOUNTER — Ambulatory Visit (HOSPITAL_COMMUNITY): Payer: Medicare HMO

## 2019-01-26 NOTE — Telephone Encounter (Signed)
Spoke with patient. He stated he is feeling better. Patient was asked if he would like an appointment today and stated that he would come in if Dr. Ethlyn Hall wanted to see him but he didn't think there was much to discuss other than fatigue.  Patient did not make an appointment and was advised to call back early next week if symptoms continue over the weekend.

## 2019-01-26 NOTE — Telephone Encounter (Signed)
-----   Message from [name], MD sent at 01/24/2019  4:51 PM EST ----- Please call him Friday morning - see how cough is doing and how he is feeling? If not noting improvement in symptoms put him in on same day slot.

## 2019-01-26 NOTE — Telephone Encounter (Signed)
noted 

## 2019-01-29 ENCOUNTER — Other Ambulatory Visit: Payer: Self-pay | Admitting: Family Medicine

## 2019-01-29 ENCOUNTER — Other Ambulatory Visit: Payer: Self-pay

## 2019-01-29 MED ORDER — GUAIFENESIN-CODEINE 100-10 MG/5ML PO SYRP: 5.0000 mL | ORAL_SOLUTION | Freq: Three times a day (TID) | ORAL | 0 refills | Status: DC | PRN

## 2019-01-29 NOTE — Telephone Encounter (Signed)
Spoke to patient, he is unable to see same day appointment on MyChart. Patient has been advise to call the office if he needs an appointment in the future. He stated he did not think he needed an appointment he just wanted to get a medication for cough. I have spoken with dr. Ethlyn Gallery. An appointment is not needed and a cough medication will be sen tin.  Patient is aware.

## 2019-01-29 NOTE — Telephone Encounter (Signed)
Why is he told I don't have appointment when we had unfilled acute visits today?? Maybe he was looking on mychart. Add him on to end of morning please. I will stay to see him. Please call him and figure something out.

## 2019-01-29 NOTE — Telephone Encounter (Signed)
Patient stated that [facility] did not have his prescription in stock and that he can not use [facility] Rx for this medication there. He asked that we send it to [facility]. I have called in the medication, the patient is aware.

## 2019-01-30 ENCOUNTER — Encounter: Payer: Self-pay | Admitting: Oncology

## 2019-01-30 NOTE — Telephone Encounter (Signed)
"  Saurabh Hettich Cederberg [phone]).  Is it safe to proceed with treatment?  I am a cardiac patient.  Last week Dr. Lovena Le says to return next year.  Completed antibiotic but ribs hurt at level ten or more I'm coughing so much.  Very little comes up with cough.   Yesterday, Dr. Ethlyn Gallery prescribed Guaifenesin - codeine (Cheratussin) three times a day as needed because Mucinex DM and antihistamine were not changing the temperament of this machine gun cough.  Now pain at a level five or six."  Confirmed provider will me notified of questions and concerns.  Stated receiving treatment Friday may also be dependent on pre-treatment lab results.

## 2019-01-31 ENCOUNTER — Ambulatory Visit (INDEPENDENT_AMBULATORY_CARE_PROVIDER_SITE_OTHER): Payer: Medicare HMO

## 2019-01-31 ENCOUNTER — Ambulatory Visit (INDEPENDENT_AMBULATORY_CARE_PROVIDER_SITE_OTHER): Payer: Medicare HMO | Admitting: Family Medicine

## 2019-01-31 ENCOUNTER — Telehealth: Payer: Self-pay

## 2019-01-31 ENCOUNTER — Encounter: Payer: Self-pay | Admitting: Family Medicine

## 2019-01-31 VITALS — BP 118/64 | HR 97 | Temp 97.9°F | Wt 183.5 lb

## 2019-01-31 DIAGNOSIS — R0602 Shortness of breath: Secondary | ICD-10-CM | POA: Diagnosis not present

## 2019-01-31 DIAGNOSIS — R05 Cough: Secondary | ICD-10-CM

## 2019-01-31 DIAGNOSIS — R059 Cough, unspecified: Secondary | ICD-10-CM

## 2019-01-31 DIAGNOSIS — J189 Pneumonia, unspecified organism: Secondary | ICD-10-CM

## 2019-01-31 DIAGNOSIS — J181 Lobar pneumonia, unspecified organism: Secondary | ICD-10-CM | POA: Diagnosis not present

## 2019-01-31 MED ORDER — ALBUTEROL SULFATE (2.5 MG/3ML) 0.083% IN NEBU
2.5000 mg | INHALATION_SOLUTION | Freq: Once | RESPIRATORY_TRACT | Status: AC
  Administered 2019-01-31: 2.5 mg via RESPIRATORY_TRACT

## 2019-01-31 MED ORDER — E-Z SPACER DEVI: 2 refills | Status: DC

## 2019-01-31 MED ORDER — ALBUTEROL SULFATE HFA 108 (90 BASE) MCG/ACT IN AERS: 2.0000 | INHALATION_SPRAY | Freq: Four times a day (QID) | RESPIRATORY_TRACT | 0 refills | Status: DC | PRN

## 2019-01-31 MED ORDER — LEVOFLOXACIN 750 MG PO TABS: 750.0000 mg | ORAL_TABLET | Freq: Every day | ORAL | 0 refills | Status: DC

## 2019-01-31 NOTE — Telephone Encounter (Signed)
Noted ok.

## 2019-01-31 NOTE — Progress Notes (Signed)
Lee Hall DOB: 06-23-1951 Encounter date: 01/31/2019  This is a 68 y.o. male who presents with Chief Complaint  Patient presents with  . Follow-up    History of present illness: Mid January had sore throat which occurred after Nivolumab infusion ; that passed. After next infusion therapy didn't have sore throat - more sick, slightly feverish. Thought related to infusion (no sore throat). 3 days later he contacted [place], had visit, dx with pneumonia (seen on xray). Would feel fine if he didn't have cough and he also now feels short of breath. Not sure if related to pneumonia or what. But any activity makes him feel short of breath.   (Of note, he was noted on CXR to have RUL pneumonia with minimal right pleural effusion)  He is a "stroller" not a walker since becoming heart patient. Having to stop 4 times on 17 stairs to house. Usually just 1 stop. Feels like he is panting like dog. He coughs with deep breath. Cough is persistent day/night.  Hasn't slept in bed flat for 2 week. Waking up a couple of times/night gasping for air.   No productivity of cough. Not sure if he is supposed to be spitting something up.   Taking cough medication with codeine - only has noted a ten minute improvement in time between coughing episodes. Recently feels like he hears crackling that occurs just on occasion with cough. Describes this as wheeze sound versus crackling.  Energy is still poor; breathing affects this as well.   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   Not felt like he was feverish.   No appetite. Nothing smells good, sounds good, etc.   No lower extremity edema noted.   No Known Allergies Current Meds  Medication Sig  . acyclovir (ZOVIRAX) 800 MG tablet Take 400 mg by mouth See admin instructions. Take 400 mg by mouth in the evening every other day  . amiodarone (PACERONE) 200 MG tablet Take 1 tablet (200 mg total) by mouth daily.  Marland Kitchen aspirin  81 MG tablet Take 81 mg by mouth daily.    . carvedilol (COREG) 6.25 MG tablet Take 1 tablet (6.25 mg total) by mouth 2 (two) times daily with a meal.  . empagliflozin (JARDIANCE) 10 MG TABS tablet Take 10 mg by mouth daily.  Marland Kitchen guaiFENesin-codeine (CHERATUSSIN AC) 100-10 MG/5ML syrup Take 5 mLs by mouth 3 (three) times daily as needed for cough.  Marland Kitchen ipratropium (ATROVENT) 0.06 % nasal spray Place 2 sprays into both nostrils 3 (three) times daily.  . metFORMIN (GLUCOPHAGE-XR) 500 MG 24 hr tablet Take 1 tablet by mouth 2 (two) times daily.  Marland Kitchen mexiletine (MEXITIL) 150 MG capsule Take 1 capsule (150 mg total) by mouth 3 (three) times daily.  . rosuvastatin (CRESTOR) 10 MG tablet Take 10 mg by mouth every evening.   . sacubitril-valsartan (ENTRESTO) 24-26 MG Take 1 tablet by mouth 2 (two) times daily.  . [DISCONTINUED] benzonatate (TESSALON PERLES) 100 MG capsule Take 1 capsule (100 mg total) by mouth 3 (three) times daily as needed for cough.    Review of Systems  Constitutional: Positive for activity change, appetite change and fatigue. Negative for diaphoresis and fever.  HENT: Negative for congestion, sinus pressure, sinus pain and sore throat.   Respiratory: Positive for cough, chest tightness (slightl; improved after abuterol neb), shortness of breath and wheezing.   Cardiovascular: Positive for chest pain (just with coughing.).  Gastrointestinal: Positive for constipation (feels like BM decreased  because not eating). Negative for abdominal pain.  Psychiatric/Behavioral: Positive for sleep disturbance (with cough; sleeps with cough drop).    Objective:  BP 118/64 (BP Location: Left Arm, Patient Position: Sitting, Cuff Size: Normal)   Pulse 97   Temp 97.9 F (36.6 C) (Oral)   Wt 183 lb 8 oz (83.2 kg)   SpO2 97%   BMI 25.59 kg/m   Weight: 183 lb 8 oz (83.2 kg)   BP Readings from Last 3 Encounters:  01/31/19 118/64  01/23/19 102/64  01/19/19 110/60   Wt Readings from Last 3  Encounters:  01/31/19 183 lb 8 oz (83.2 kg)  01/23/19 179 lb 9.6 oz (81.5 kg)  01/19/19 179 lb 6.4 oz (81.4 kg)    Physical Exam Constitutional:      General: He is not in acute distress.    Appearance: He is well-developed. He is ill-appearing. He is not toxic-appearing or diaphoretic.  HENT:     Nose: Mucosal edema present.     Right Sinus: No maxillary sinus tenderness or frontal sinus tenderness.     Left Sinus: No maxillary sinus tenderness or frontal sinus tenderness.     Mouth/Throat:     Pharynx: Uvula midline.  Cardiovascular:     Rate and Rhythm: Normal rate and regular rhythm.     Heart sounds: Normal heart sounds, S1 normal and S2 normal.  Pulmonary:     Effort: Pulmonary effort is normal.     Breath sounds: Examination of the right-middle field reveals rales. Examination of the right-lower field reveals rales. Examination of the left-lower field reveals rales. Decreased breath sounds (mild; improved after albuterol) and rales present. No wheezing or rhonchi.     Comments: There is significant increase in rales right mid through lower lung fields; there are mild LLL rales. Musculoskeletal:     Right lower leg: No edema.     Left lower leg: No edema.  Lymphadenopathy:     Head:     Right side of head: Submandibular adenopathy present.     Left side of head: Submandibular adenopathy present.     Assessment/Plan 1. Cough Stat CXR in office does show worsening air space disease. Congestion appears increased on view of this. There are also bilat small pleural effusions noted by radiology.   I am concerned with his worsening of symptoms while on the doxycycline and worsening exam/xray. We discussed treatment options and are going to treat with levaquin, and we did discuss interaction of this with amiodarone, but at this point we need to work on improving infection and alternative treatment for CAP would include macrolide which comes with similar risk.   We will follow up  closely to ensure he is making progress. labwork obtained today for comparison to previous.   Would consider CT chest if not noting improvement with above and labwork is not revealing to further evaluate area of congestion. Since oncology has imaging pending I will discuss this with them if it seems medically indicated.  - DG Chest 2 View; Future - D-dimer, quantitative (not at [facility]); Future - Brain natriuretic peptide; Future - Brain natriuretic peptide - D-dimer, quantitative (not at [facility]) - albuterol (PROVENTIL) (2.5 MG/3ML) 0.083% nebulizer solution 2.5 mg  2. Shortness of breath See above - DG Chest 2 View; Future - PR INHAL RX, AIRWAY OBST/DX SPUTUM INDUCT - albuterol (PROVENTIL) (2.5 MG/3ML) 0.083% nebulizer solution 2.5 mg - Spacer/Aero-Holding Chambers (E-Z SPACER) inhaler; Use as instructed  Dispense: 1 each; Refill: 2  3. Pneumonia of right lower lobe due to infectious organism [facility]) See above. Starting levaquin. He had some symptomatic improvement with albuterol so will write for home use with spacer. Discussed how to use this in detail in the office with picture review. - CBC with Differential/Platelet; Future - CBC with Differential/Platelet - albuterol (PROVENTIL) (2.5 MG/3ML) 0.083% nebulizer solution 2.5 mg    Return in about 5 days (around 02/05/2019), or follow up sick visit.    Micheline Rough, MD

## 2019-01-31 NOTE — Patient Instructions (Signed)
I will call you once I receive your bloodwork results.   I will be sending a note to both your cardiologist and oncologist regarding your ongoing symptoms, xray results and to discuss follow up (including the scheduled CT scan).   Please proceed to ER if any worsening of symptoms in meanwhile.

## 2019-01-31 NOTE — Telephone Encounter (Signed)
Patient called in. Stated that his cough has not improved with the new cough medication. He is sore from coughing and can hear a "crackeling sound" when breathing. Patient requested an appointment ASAP. Appointment has been made for today at 3:00.  Will send to Dr. Ethlyn Gallery as Juluis Rainier.

## 2019-01-31 NOTE — Telephone Encounter (Signed)
This issue was addressed after he was called from [facility] 2 days ago.

## 2019-02-01 ENCOUNTER — Other Ambulatory Visit: Payer: Self-pay

## 2019-02-01 ENCOUNTER — Emergency Department (HOSPITAL_COMMUNITY): Payer: Medicare HMO

## 2019-02-01 ENCOUNTER — Encounter: Payer: Self-pay | Admitting: Family Medicine

## 2019-02-01 ENCOUNTER — Encounter (HOSPITAL_COMMUNITY): Payer: Self-pay

## 2019-02-01 ENCOUNTER — Inpatient Hospital Stay (HOSPITAL_COMMUNITY)
Admission: EM | Admit: 2019-02-01 | Discharge: 2019-02-14 | DRG: 871 | Disposition: A | Discharge status: Home Health | Payer: Medicare HMO | Attending: Internal Medicine | Admitting: Internal Medicine

## 2019-02-01 ENCOUNTER — Inpatient Hospital Stay (HOSPITAL_COMMUNITY): Payer: Medicare HMO

## 2019-02-01 DIAGNOSIS — Z9289 Personal history of other medical treatment: Secondary | ICD-10-CM

## 2019-02-01 DIAGNOSIS — J810 Acute pulmonary edema: Secondary | ICD-10-CM | POA: Diagnosis not present

## 2019-02-01 DIAGNOSIS — Y929 Unspecified place or not applicable: Secondary | ICD-10-CM | POA: Diagnosis not present

## 2019-02-01 DIAGNOSIS — G9341 Metabolic encephalopathy: Secondary | ICD-10-CM | POA: Diagnosis present

## 2019-02-01 DIAGNOSIS — I5021 Acute systolic (congestive) heart failure: Secondary | ICD-10-CM | POA: Diagnosis not present

## 2019-02-01 DIAGNOSIS — I083 Combined rheumatic disorders of mitral, aortic and tricuspid valves: Secondary | ICD-10-CM | POA: Diagnosis present

## 2019-02-01 DIAGNOSIS — K7200 Acute and subacute hepatic failure without coma: Secondary | ICD-10-CM | POA: Diagnosis not present

## 2019-02-01 DIAGNOSIS — Z4682 Encounter for fitting and adjustment of non-vascular catheter: Secondary | ICD-10-CM | POA: Diagnosis not present

## 2019-02-01 DIAGNOSIS — J9690 Respiratory failure, unspecified, unspecified whether with hypoxia or hypercapnia: Secondary | ICD-10-CM | POA: Diagnosis not present

## 2019-02-01 DIAGNOSIS — J9601 Acute respiratory failure with hypoxia: Secondary | ICD-10-CM | POA: Diagnosis present

## 2019-02-01 DIAGNOSIS — J704 Drug-induced interstitial lung disorders, unspecified: Secondary | ICD-10-CM | POA: Diagnosis present

## 2019-02-01 DIAGNOSIS — N170 Acute kidney failure with tubular necrosis: Secondary | ICD-10-CM | POA: Diagnosis not present

## 2019-02-01 DIAGNOSIS — I4891 Unspecified atrial fibrillation: Secondary | ICD-10-CM | POA: Diagnosis present

## 2019-02-01 DIAGNOSIS — R0602 Shortness of breath: Secondary | ICD-10-CM | POA: Diagnosis not present

## 2019-02-01 DIAGNOSIS — Z452 Encounter for adjustment and management of vascular access device: Secondary | ICD-10-CM | POA: Diagnosis not present

## 2019-02-01 DIAGNOSIS — I252 Old myocardial infarction: Secondary | ICD-10-CM

## 2019-02-01 DIAGNOSIS — C4361 Malignant melanoma of right upper limb, including shoulder: Secondary | ICD-10-CM | POA: Diagnosis present

## 2019-02-01 DIAGNOSIS — I340 Nonrheumatic mitral (valve) insufficiency: Secondary | ICD-10-CM

## 2019-02-01 DIAGNOSIS — R6520 Severe sepsis without septic shock: Secondary | ICD-10-CM

## 2019-02-01 DIAGNOSIS — D696 Thrombocytopenia, unspecified: Secondary | ICD-10-CM | POA: Diagnosis present

## 2019-02-01 DIAGNOSIS — I468 Cardiac arrest due to other underlying condition: Secondary | ICD-10-CM | POA: Diagnosis present

## 2019-02-01 DIAGNOSIS — Z7982 Long term (current) use of aspirin: Secondary | ICD-10-CM

## 2019-02-01 DIAGNOSIS — R6521 Severe sepsis with septic shock: Secondary | ICD-10-CM | POA: Diagnosis not present

## 2019-02-01 DIAGNOSIS — R339 Retention of urine, unspecified: Secondary | ICD-10-CM | POA: Diagnosis not present

## 2019-02-01 DIAGNOSIS — I2510 Atherosclerotic heart disease of native coronary artery without angina pectoris: Secondary | ICD-10-CM | POA: Diagnosis present

## 2019-02-01 DIAGNOSIS — Z820 Family history of epilepsy and other diseases of the nervous system: Secondary | ICD-10-CM

## 2019-02-01 DIAGNOSIS — Z87891 Personal history of nicotine dependence: Secondary | ICD-10-CM

## 2019-02-01 DIAGNOSIS — J90 Pleural effusion, not elsewhere classified: Secondary | ICD-10-CM | POA: Diagnosis not present

## 2019-02-01 DIAGNOSIS — J80 Acute respiratory distress syndrome: Secondary | ICD-10-CM | POA: Diagnosis not present

## 2019-02-01 DIAGNOSIS — E1165 Type 2 diabetes mellitus with hyperglycemia: Secondary | ICD-10-CM | POA: Diagnosis present

## 2019-02-01 DIAGNOSIS — Z833 Family history of diabetes mellitus: Secondary | ICD-10-CM

## 2019-02-01 DIAGNOSIS — T451X5A Adverse effect of antineoplastic and immunosuppressive drugs, initial encounter: Secondary | ICD-10-CM | POA: Diagnosis present

## 2019-02-01 DIAGNOSIS — I469 Cardiac arrest, cause unspecified: Secondary | ICD-10-CM

## 2019-02-01 DIAGNOSIS — I5023 Acute on chronic systolic (congestive) heart failure: Secondary | ICD-10-CM | POA: Diagnosis not present

## 2019-02-01 DIAGNOSIS — Z8679 Personal history of other diseases of the circulatory system: Secondary | ICD-10-CM | POA: Diagnosis not present

## 2019-02-01 DIAGNOSIS — T380X5A Adverse effect of glucocorticoids and synthetic analogues, initial encounter: Secondary | ICD-10-CM | POA: Diagnosis present

## 2019-02-01 DIAGNOSIS — R739 Hyperglycemia, unspecified: Secondary | ICD-10-CM

## 2019-02-01 DIAGNOSIS — Z79899 Other long term (current) drug therapy: Secondary | ICD-10-CM | POA: Diagnosis not present

## 2019-02-01 DIAGNOSIS — I361 Nonrheumatic tricuspid (valve) insufficiency: Secondary | ICD-10-CM | POA: Diagnosis not present

## 2019-02-01 DIAGNOSIS — I255 Ischemic cardiomyopathy: Secondary | ICD-10-CM | POA: Diagnosis present

## 2019-02-01 DIAGNOSIS — J181 Lobar pneumonia, unspecified organism: Secondary | ICD-10-CM | POA: Diagnosis not present

## 2019-02-01 DIAGNOSIS — Z7984 Long term (current) use of oral hypoglycemic drugs: Secondary | ICD-10-CM | POA: Diagnosis not present

## 2019-02-01 DIAGNOSIS — C439 Malignant melanoma of skin, unspecified: Secondary | ICD-10-CM | POA: Diagnosis not present

## 2019-02-01 DIAGNOSIS — Y92239 Unspecified place in hospital as the place of occurrence of the external cause: Secondary | ICD-10-CM | POA: Diagnosis not present

## 2019-02-01 DIAGNOSIS — Z811 Family history of alcohol abuse and dependence: Secondary | ICD-10-CM

## 2019-02-01 DIAGNOSIS — Z950 Presence of cardiac pacemaker: Secondary | ICD-10-CM

## 2019-02-01 DIAGNOSIS — A419 Sepsis, unspecified organism: Secondary | ICD-10-CM | POA: Diagnosis not present

## 2019-02-01 DIAGNOSIS — J189 Pneumonia, unspecified organism: Secondary | ICD-10-CM | POA: Diagnosis not present

## 2019-02-01 DIAGNOSIS — I5022 Chronic systolic (congestive) heart failure: Secondary | ICD-10-CM | POA: Diagnosis not present

## 2019-02-01 DIAGNOSIS — F419 Anxiety disorder, unspecified: Secondary | ICD-10-CM | POA: Diagnosis present

## 2019-02-01 DIAGNOSIS — R0603 Acute respiratory distress: Secondary | ICD-10-CM

## 2019-02-01 DIAGNOSIS — I447 Left bundle-branch block, unspecified: Secondary | ICD-10-CM | POA: Diagnosis present

## 2019-02-01 DIAGNOSIS — Z978 Presence of other specified devices: Secondary | ICD-10-CM

## 2019-02-01 DIAGNOSIS — I959 Hypotension, unspecified: Secondary | ICD-10-CM | POA: Diagnosis not present

## 2019-02-01 DIAGNOSIS — I248 Other forms of acute ischemic heart disease: Secondary | ICD-10-CM | POA: Diagnosis not present

## 2019-02-01 DIAGNOSIS — Z95810 Presence of automatic (implantable) cardiac defibrillator: Secondary | ICD-10-CM

## 2019-02-01 DIAGNOSIS — J984 Other disorders of lung: Secondary | ICD-10-CM | POA: Diagnosis not present

## 2019-02-01 DIAGNOSIS — R001 Bradycardia, unspecified: Secondary | ICD-10-CM | POA: Diagnosis not present

## 2019-02-01 DIAGNOSIS — R2689 Other abnormalities of gait and mobility: Secondary | ICD-10-CM | POA: Diagnosis not present

## 2019-02-01 DIAGNOSIS — G9340 Encephalopathy, unspecified: Secondary | ICD-10-CM | POA: Diagnosis not present

## 2019-02-01 HISTORY — DX: Sepsis, unspecified organism: A41.9

## 2019-02-01 HISTORY — DX: Pneumonia, unspecified organism: J18.9

## 2019-02-01 LAB — POCT I-STAT 7, (LYTES, BLD GAS, ICA,H+H)
ACID-BASE DEFICIT: 14 mmol/L — AB (ref 0.0–2.0)
Acid-base deficit: 21 mmol/L — ABNORMAL HIGH (ref 0.0–2.0)
Acid-base deficit: 7 mmol/L — ABNORMAL HIGH (ref 0.0–2.0)
Bicarbonate: 8.6 mmol/L — ABNORMAL LOW (ref 20.0–28.0)
Bicarbonate: 11.6 mmol/L — ABNORMAL LOW (ref 20.0–28.0)
Bicarbonate: 15.9 mmol/L — ABNORMAL LOW (ref 20.0–28.0)
CALCIUM ION: 0.91 mmol/L — AB (ref 1.15–1.40)
Calcium, Ion: 0.96 mmol/L — ABNORMAL LOW (ref 1.15–1.40)
Calcium, Ion: 0.93 mmol/L — ABNORMAL LOW (ref 1.15–1.40)
HCT: 38 % — ABNORMAL LOW (ref 39.0–52.0)
HCT: 39 % (ref 39.0–52.0)
HCT: 41 % (ref 39.0–52.0)
Hemoglobin: 12.9 g/dL — ABNORMAL LOW (ref 13.0–17.0)
Hemoglobin: 13.3 g/dL (ref 13.0–17.0)
Hemoglobin: 13.9 g/dL (ref 13.0–17.0)
O2 Saturation: 87 %
O2 Saturation: 100 %
O2 Saturation: 99 %
PO2 ART: 75 mmHg — AB (ref 83.0–108.0)
PO2 ART: 259 mmHg — AB (ref 83.0–108.0)
Patient temperature: 98.6
Patient temperature: 93.7
Patient temperature: 99.3
Potassium: 6 mmol/L — ABNORMAL HIGH (ref 3.5–5.1)
Potassium: 4.7 mmol/L (ref 3.5–5.1)
Potassium: 4.6 mmol/L (ref 3.5–5.1)
Sodium: 139 mmol/L (ref 135–145)
Sodium: 141 mmol/L (ref 135–145)
Sodium: 140 mmol/L (ref 135–145)
TCO2: 10 mmol/L — ABNORMAL LOW (ref 22–32)
TCO2: 12 mmol/L — ABNORMAL LOW (ref 22–32)
TCO2: 17 mmol/L — ABNORMAL LOW (ref 22–32)
pCO2 arterial: 31.7 mmHg — ABNORMAL LOW (ref 32.0–48.0)
pCO2 arterial: 23.6 mmHg — ABNORMAL LOW (ref 32.0–48.0)
pCO2 arterial: 26.7 mmHg — ABNORMAL LOW (ref 32.0–48.0)
pH, Arterial: 7.042 — CL (ref 7.350–7.450)
pH, Arterial: 7.288 — ABNORMAL LOW (ref 7.350–7.450)
pH, Arterial: 7.384 (ref 7.350–7.450)
pO2, Arterial: 153 mmHg — ABNORMAL HIGH (ref 83.0–108.0)

## 2019-02-01 LAB — TROPONIN I
Troponin I: 0.06 ng/mL (ref <0.03)
Troponin I: 0.19 ng/mL (ref <0.03)
Troponin I: 0.16 ng/mL (ref <0.03)

## 2019-02-01 LAB — COMPREHENSIVE METABOLIC PANEL
ALBUMIN: 2.3 g/dL — AB (ref 3.5–5.0)
ALT: 40 U/L (ref 0–44)
ALT: 3747 U/L — ABNORMAL HIGH (ref 0–44)
AST: 50 U/L — ABNORMAL HIGH (ref 15–41)
AST: 6759 U/L — ABNORMAL HIGH (ref 15–41)
Albumin: 2.7 g/dL — ABNORMAL LOW (ref 3.5–5.0)
Alkaline Phosphatase: 128 U/L — ABNORMAL HIGH (ref 38–126)
Alkaline Phosphatase: 162 U/L — ABNORMAL HIGH (ref 38–126)
Anion gap: 15 (ref 5–15)
Anion gap: 10 (ref 5–15)
BUN: 25 mg/dL — ABNORMAL HIGH (ref 8–23)
BUN: 39 mg/dL — ABNORMAL HIGH (ref 8–23)
CO2: 17 mmol/L — ABNORMAL LOW (ref 22–32)
CO2: 13 mmol/L — ABNORMAL LOW (ref 22–32)
Calcium: 8.5 mg/dL — ABNORMAL LOW (ref 8.9–10.3)
Calcium: 6.2 mg/dL — CL (ref 8.9–10.3)
Chloride: 103 mmol/L (ref 98–111)
Chloride: 115 mmol/L — ABNORMAL HIGH (ref 98–111)
Creatinine, Ser: 1.47 mg/dL — ABNORMAL HIGH (ref 0.61–1.24)
Creatinine, Ser: 2.23 mg/dL — ABNORMAL HIGH (ref 0.61–1.24)
GFR calc Af Amer: 57 mL/min — ABNORMAL LOW (ref >60)
GFR calc Af Amer: 35 mL/min — ABNORMAL LOW (ref >60)
GFR calc non Af Amer: 30 mL/min — ABNORMAL LOW (ref >60)
GFR, EST NON AFRICAN AMERICAN: 49 mL/min — AB (ref >60)
Glucose, Bld: 272 mg/dL — ABNORMAL HIGH (ref 70–99)
Glucose, Bld: 277 mg/dL — ABNORMAL HIGH (ref 70–99)
Potassium: 4 mmol/L (ref 3.5–5.1)
Potassium: 4.6 mmol/L (ref 3.5–5.1)
Sodium: 135 mmol/L (ref 135–145)
Sodium: 138 mmol/L (ref 135–145)
Total Bilirubin: 1 mg/dL (ref 0.3–1.2)
Total Bilirubin: 1.6 mg/dL — ABNORMAL HIGH (ref 0.3–1.2)
Total Protein: 6 g/dL — ABNORMAL LOW (ref 6.5–8.1)
Total Protein: 4.9 g/dL — ABNORMAL LOW (ref 6.5–8.1)

## 2019-02-01 LAB — CBC WITH DIFFERENTIAL/PLATELET
Abs Immature Granulocytes: 0.07 10*3/uL (ref 0.00–0.07)
Basophils Absolute: 0 10*3/uL (ref 0.0–0.1)
Basophils Absolute: 0 10*3/uL (ref 0.0–0.1)
Basophils Relative: 0.6 % (ref 0.0–3.0)
Basophils Relative: 0 %
EOS PCT: 2.9 % (ref 0.0–5.0)
EOS PCT: 0 %
Eosinophils Absolute: 0.2 10*3/uL (ref 0.0–0.7)
Eosinophils Absolute: 0 10*3/uL (ref 0.0–0.5)
HCT: 42.3 % (ref 39.0–52.0)
HEMATOCRIT: 43.8 % (ref 39.0–52.0)
Hemoglobin: 13.8 g/dL (ref 13.0–17.0)
Hemoglobin: 13.6 g/dL (ref 13.0–17.0)
Immature Granulocytes: 0 %
Lymphocytes Relative: 16.8 % (ref 12.0–46.0)
Lymphocytes Relative: 5 %
Lymphs Abs: 1 10*3/uL (ref 0.7–4.0)
Lymphs Abs: 0.8 10*3/uL (ref 0.7–4.0)
MCH: 29.4 pg (ref 26.0–34.0)
MCHC: 32.5 g/dL (ref 30.0–36.0)
MCHC: 31.1 g/dL (ref 30.0–36.0)
MCV: 91.9 fl (ref 78.0–100.0)
MCV: 94.8 fL (ref 80.0–100.0)
Monocytes Absolute: 0.6 10*3/uL (ref 0.1–1.0)
Monocytes Absolute: 0.7 10*3/uL (ref 0.1–1.0)
Monocytes Relative: 9.7 % (ref 3.0–12.0)
Monocytes Relative: 4 %
Neutro Abs: 4.3 10*3/uL (ref 1.4–7.7)
Neutro Abs: 14.1 10*3/uL — ABNORMAL HIGH (ref 1.7–7.7)
Neutrophils Relative %: 70 % (ref 43.0–77.0)
Neutrophils Relative %: 91 %
Platelets: 207 10*3/uL (ref 150.0–400.0)
Platelets: 185 10*3/uL (ref 150–400)
RBC: 4.61 Mil/uL (ref 4.22–5.81)
RBC: 4.62 MIL/uL (ref 4.22–5.81)
RDW: 15.2 % (ref 11.5–15.5)
RDW: 14.2 % (ref 11.5–15.5)
WBC: 6.2 10*3/uL (ref 4.0–10.5)
WBC: 15.8 10*3/uL — AB (ref 4.0–10.5)
nRBC: 0 % (ref 0.0–0.2)

## 2019-02-01 LAB — CORTISOL: Cortisol, Plasma: 50.3 ug/dL

## 2019-02-01 LAB — BASIC METABOLIC PANEL
Anion gap: 17 — ABNORMAL HIGH (ref 5–15)
BUN: 27 mg/dL — ABNORMAL HIGH (ref 8–23)
CO2: 12 mmol/L — ABNORMAL LOW (ref 22–32)
CREATININE: 1.79 mg/dL — AB (ref 0.61–1.24)
Calcium: 6.6 mg/dL — ABNORMAL LOW (ref 8.9–10.3)
Chloride: 112 mmol/L — ABNORMAL HIGH (ref 98–111)
GFR calc Af Amer: 45 mL/min — ABNORMAL LOW (ref >60)
GFR calc non Af Amer: 39 mL/min — ABNORMAL LOW (ref >60)
Glucose, Bld: 169 mg/dL — ABNORMAL HIGH (ref 70–99)
Potassium: 4.7 mmol/L (ref 3.5–5.1)
Sodium: 141 mmol/L (ref 135–145)

## 2019-02-01 LAB — ECHOCARDIOGRAM COMPLETE
Height: 71 in
Weight: 2934.76 oz

## 2019-02-01 LAB — RESPIRATORY PANEL BY PCR
Adenovirus: NOT DETECTED
BORDETELLA PERTUSSIS-RVPCR: NOT DETECTED
CORONAVIRUS HKU1-RVPPCR: NOT DETECTED
Chlamydophila pneumoniae: NOT DETECTED
Coronavirus 229E: NOT DETECTED
Coronavirus NL63: NOT DETECTED
Coronavirus OC43: NOT DETECTED
Influenza A: NOT DETECTED
Influenza B: NOT DETECTED
Metapneumovirus: NOT DETECTED
Mycoplasma pneumoniae: NOT DETECTED
Parainfluenza Virus 1: NOT DETECTED
Parainfluenza Virus 2: NOT DETECTED
Parainfluenza Virus 3: NOT DETECTED
Parainfluenza Virus 4: NOT DETECTED
Respiratory Syncytial Virus: NOT DETECTED
Rhinovirus / Enterovirus: NOT DETECTED

## 2019-02-01 LAB — BRAIN NATRIURETIC PEPTIDE: Pro B Natriuretic peptide (BNP): 1698 pg/mL — ABNORMAL HIGH (ref 0.0–100.0)

## 2019-02-01 LAB — LACTIC ACID, PLASMA
LACTIC ACID, VENOUS: 9.3 mmol/L — AB (ref 0.5–1.9)
Lactic Acid, Venous: 4.8 mmol/L (ref 0.5–1.9)
Lactic Acid, Venous: 7.7 mmol/L (ref 0.5–1.9)
Lactic Acid, Venous: 8.3 mmol/L (ref 0.5–1.9)

## 2019-02-01 LAB — GLUCOSE, CAPILLARY
Glucose-Capillary: 145 mg/dL — ABNORMAL HIGH (ref 70–99)
Glucose-Capillary: 203 mg/dL — ABNORMAL HIGH (ref 70–99)
Glucose-Capillary: 230 mg/dL — ABNORMAL HIGH (ref 70–99)
Glucose-Capillary: 239 mg/dL — ABNORMAL HIGH (ref 70–99)

## 2019-02-01 LAB — MRSA PCR SCREENING: MRSA by PCR: NEGATIVE

## 2019-02-01 LAB — PROCALCITONIN: Procalcitonin: 1.57 ng/mL

## 2019-02-01 MED ORDER — SODIUM CHLORIDE 0.9 % IV BOLUS
1000.0000 mL | Freq: Once | INTRAVENOUS | Status: AC
  Administered 2019-02-01: 1000 mL via INTRAVENOUS

## 2019-02-01 MED ORDER — SODIUM CHLORIDE 0.9 % IV SOLN
2.0000 g | Freq: Once | INTRAVENOUS | Status: AC
  Administered 2019-02-01: 2 g via INTRAVENOUS
  Filled 2019-02-01 (×2): qty 2

## 2019-02-01 MED ORDER — SODIUM BICARBONATE 8.4 % IV SOLN
INTRAVENOUS | Status: AC | PRN
  Administered 2019-02-01: 50 meq via INTRAVENOUS

## 2019-02-01 MED ORDER — MIDAZOLAM 50MG/50ML (1MG/ML) PREMIX INFUSION
0.5000 mg/h | INTRAVENOUS | Status: DC
  Administered 2019-02-01 – 2019-02-02 (×2): 0.5 mg/h via INTRAVENOUS
  Filled 2019-02-01 (×2): qty 50

## 2019-02-01 MED ORDER — SODIUM BICARBONATE 8.4 % IV SOLN
INTRAVENOUS | Status: DC
  Administered 2019-02-01 – 2019-02-03 (×4): via INTRAVENOUS
  Filled 2019-02-01 (×4): qty 150

## 2019-02-01 MED ORDER — SODIUM BICARBONATE 8.4 % IV SOLN
INTRAVENOUS | Status: AC
  Filled 2019-02-01: qty 50

## 2019-02-01 MED ORDER — PANTOPRAZOLE SODIUM 40 MG PO PACK
40.0000 mg | PACK | Freq: Every day | ORAL | Status: DC
  Administered 2019-02-01 – 2019-02-03 (×3): 40 mg
  Filled 2019-02-01 (×5): qty 20

## 2019-02-01 MED ORDER — SODIUM BICARBONATE 8.4 % IV SOLN
50.0000 meq | Freq: Once | INTRAVENOUS | Status: AC
  Administered 2019-02-01: 50 meq via INTRAVENOUS

## 2019-02-01 MED ORDER — MIDAZOLAM HCL 2 MG/2ML IJ SOLN
1.0000 mg | INTRAMUSCULAR | Status: AC | PRN
  Administered 2019-02-01 – 2019-02-02 (×3): 1 mg via INTRAVENOUS
  Filled 2019-02-01 (×2): qty 2

## 2019-02-01 MED ORDER — NOREPINEPHRINE 16 MG/250ML-% IV SOLN
0.0000 ug/min | INTRAVENOUS | Status: DC
  Administered 2019-02-01: 40 ug/min via INTRAVENOUS
  Administered 2019-02-01: 30 ug/min via INTRAVENOUS
  Administered 2019-02-02: 16 ug/min via INTRAVENOUS
  Administered 2019-02-04: 6 ug/min via INTRAVENOUS
  Filled 2019-02-01 (×5): qty 250

## 2019-02-01 MED ORDER — MIDAZOLAM HCL 2 MG/2ML IJ SOLN
INTRAMUSCULAR | Status: AC
  Administered 2019-02-01: 2 mg
  Filled 2019-02-01: qty 2

## 2019-02-01 MED ORDER — SODIUM CHLORIDE 0.9 % IV SOLN
500.0000 mg | INTRAVENOUS | Status: DC
  Administered 2019-02-01: 500 mg via INTRAVENOUS
  Filled 2019-02-01: qty 500

## 2019-02-01 MED ORDER — IPRATROPIUM-ALBUTEROL 0.5-2.5 (3) MG/3ML IN SOLN
3.0000 mL | Freq: Four times a day (QID) | RESPIRATORY_TRACT | Status: DC
  Administered 2019-02-01 – 2019-02-02 (×5): 3 mL via RESPIRATORY_TRACT
  Filled 2019-02-01 (×4): qty 3

## 2019-02-01 MED ORDER — VANCOMYCIN HCL 10 G IV SOLR
1200.0000 mg | INTRAVENOUS | Status: DC
  Filled 2019-02-01: qty 1200

## 2019-02-01 MED ORDER — SODIUM CHLORIDE 0.9 % IV SOLN
2.0000 g | INTRAVENOUS | Status: DC
  Administered 2019-02-01: 2 g via INTRAVENOUS
  Filled 2019-02-01: qty 20

## 2019-02-01 MED ORDER — SODIUM CHLORIDE 0.9 % IV SOLN
2.0000 g | INTRAVENOUS | Status: AC
  Administered 2019-02-02 – 2019-02-07 (×6): 2 g via INTRAVENOUS
  Filled 2019-02-01 (×6): qty 2

## 2019-02-01 MED ORDER — ORAL CARE MOUTH RINSE
15.0000 mL | OROMUCOSAL | Status: DC
  Administered 2019-02-01 – 2019-02-04 (×26): 15 mL via OROMUCOSAL

## 2019-02-01 MED ORDER — SODIUM CHLORIDE 0.9 % IV SOLN
2.0000 g | Freq: Two times a day (BID) | INTRAVENOUS | Status: DC
  Filled 2019-02-01: qty 2

## 2019-02-01 MED ORDER — HEPARIN SODIUM (PORCINE) 5000 UNIT/ML IJ SOLN
5000.0000 [IU] | Freq: Three times a day (TID) | INTRAMUSCULAR | Status: DC
  Administered 2019-02-01 – 2019-02-07 (×18): 5000 [IU] via SUBCUTANEOUS
  Filled 2019-02-01 (×18): qty 1

## 2019-02-01 MED ORDER — FENTANYL 2500MCG IN NS 250ML (10MCG/ML) PREMIX INFUSION
25.0000 ug/h | INTRAVENOUS | Status: DC
  Administered 2019-02-01: 300 ug/h via INTRAVENOUS
  Administered 2019-02-01: 100 ug/h via INTRAVENOUS
  Administered 2019-02-02: 200 ug/h via INTRAVENOUS
  Administered 2019-02-02: 225 ug/h via INTRAVENOUS
  Administered 2019-02-03 (×2): 250 ug/h via INTRAVENOUS
  Filled 2019-02-01 (×8): qty 250

## 2019-02-01 MED ORDER — VANCOMYCIN HCL 10 G IV SOLR
1500.0000 mg | Freq: Once | INTRAVENOUS | Status: AC
  Administered 2019-02-01: 1500 mg via INTRAVENOUS
  Filled 2019-02-01: qty 1500

## 2019-02-01 MED ORDER — FENTANYL CITRATE (PF) 100 MCG/2ML IJ SOLN
INTRAMUSCULAR | Status: AC
  Administered 2019-02-01: 100 ug
  Filled 2019-02-01: qty 2

## 2019-02-01 MED ORDER — METHYLPREDNISOLONE SODIUM SUCC 125 MG IJ SOLR
125.0000 mg | Freq: Once | INTRAMUSCULAR | Status: AC
  Administered 2019-02-01: 125 mg via INTRAVENOUS
  Filled 2019-02-01: qty 2

## 2019-02-01 MED ORDER — VASOPRESSIN 20 UNIT/ML IV SOLN
0.0300 [IU]/min | INTRAVENOUS | Status: DC
  Administered 2019-02-01 – 2019-02-04 (×4): 0.03 [IU]/min via INTRAVENOUS
  Filled 2019-02-01 (×6): qty 2

## 2019-02-01 MED ORDER — SUCCINYLCHOLINE CHLORIDE 20 MG/ML IJ SOLN
INTRAMUSCULAR | Status: AC | PRN
  Administered 2019-02-01: 100 mg via INTRAVENOUS

## 2019-02-01 MED ORDER — NOREPINEPHRINE 4 MG/250ML-% IV SOLN
0.0000 ug/min | INTRAVENOUS | Status: DC
  Administered 2019-02-01: 5 ug/min via INTRAVENOUS
  Administered 2019-02-01: 40 ug/min via INTRAVENOUS
  Filled 2019-02-01 (×2): qty 250

## 2019-02-01 MED ORDER — EPINEPHRINE PF 1 MG/10ML IJ SOSY
PREFILLED_SYRINGE | INTRAMUSCULAR | Status: AC | PRN
  Administered 2019-02-01: 1 via INTRAVENOUS

## 2019-02-01 MED ORDER — INSULIN ASPART 100 UNIT/ML ~~LOC~~ SOLN
2.0000 [IU] | SUBCUTANEOUS | Status: DC
  Administered 2019-02-01: 2 [IU] via SUBCUTANEOUS
  Administered 2019-02-01 – 2019-02-02 (×5): 6 [IU] via SUBCUTANEOUS

## 2019-02-01 MED ORDER — CALCIUM GLUCONATE-NACL 2-0.675 GM/100ML-% IV SOLN
2.0000 g | Freq: Once | INTRAVENOUS | Status: AC
  Administered 2019-02-01: 2000 mg via INTRAVENOUS
  Filled 2019-02-01: qty 100

## 2019-02-01 MED ORDER — ETOMIDATE 2 MG/ML IV SOLN
INTRAVENOUS | Status: AC | PRN
  Administered 2019-02-01: 20 mg via INTRAVENOUS

## 2019-02-01 MED ORDER — FENTANYL CITRATE (PF) 100 MCG/2ML IJ SOLN
50.0000 ug | Freq: Once | INTRAMUSCULAR | Status: AC
  Administered 2019-02-01: 50 ug via INTRAVENOUS
  Filled 2019-02-01: qty 2

## 2019-02-01 MED ORDER — FENTANYL BOLUS VIA INFUSION
25.0000 ug | INTRAVENOUS | Status: DC | PRN
  Administered 2019-02-01 – 2019-02-03 (×4): 25 ug via INTRAVENOUS
  Filled 2019-02-01: qty 25

## 2019-02-01 MED ORDER — CHLORHEXIDINE GLUCONATE 0.12% ORAL RINSE (MEDLINE KIT)
15.0000 mL | Freq: Two times a day (BID) | OROMUCOSAL | Status: DC
  Administered 2019-02-01 – 2019-02-04 (×6): 15 mL via OROMUCOSAL

## 2019-02-01 MED ORDER — EPINEPHRINE PF 1 MG/ML IJ SOLN
0.5000 ug/min | INTRAVENOUS | Status: DC
  Administered 2019-02-01: 1 ug/min via INTRAVENOUS
  Filled 2019-02-01: qty 4

## 2019-02-01 MED ORDER — VANCOMYCIN HCL 10 G IV SOLR: 1500.0000 mg | INTRAVENOUS | Status: DC

## 2019-02-01 MED ORDER — SODIUM CHLORIDE 0.9 % IV SOLN
500.0000 mg | INTRAVENOUS | Status: DC
  Administered 2019-02-02 – 2019-02-03 (×2): 500 mg via INTRAVENOUS
  Filled 2019-02-01 (×2): qty 500

## 2019-02-01 MED ORDER — EPINEPHRINE PF 1 MG/10ML IJ SOSY
PREFILLED_SYRINGE | INTRAMUSCULAR | Status: AC | PRN
  Administered 2019-02-01: 1 via INTRAVENOUS
  Administered 2019-02-01: 1 mg via INTRAVENOUS

## 2019-02-01 MED ORDER — MIDAZOLAM HCL 2 MG/2ML IJ SOLN
1.0000 mg | INTRAMUSCULAR | Status: DC | PRN
  Administered 2019-02-02 – 2019-02-03 (×2): 1 mg via INTRAVENOUS
  Filled 2019-02-01: qty 2

## 2019-02-01 NOTE — Code Documentation (Signed)
Pulse check - no pulse noted  CPR began

## 2019-02-01 NOTE — Code Documentation (Signed)
Femoral pulse check -  pulse detected

## 2019-02-01 NOTE — ED Triage Notes (Signed)
Pt comes via [facility], c/o SOB that started around 3am, hx of CHF, noticed swelling in his lower legs, pt hypotensive on EMS arrival 80 systolic, received [id number] fluid PTA.

## 2019-02-01 NOTE — H&P (Addendum)
NAME:  Lee Hall, MRN:  [id number], DOB:  1951/10/15, LOS: 0 ADMISSION DATE:  02/01/2019, CONSULTATION DATE:  3/5 REFERRING MD:  EDP, CHIEF COMPLAINT:  Shock, respiratory failure    Brief History   68 year old male with history of CAD, ischemic cardiomyopathy, chronic systolic heart failure admitted 3/5 with septic shock, right upper lobe pneumonia +/- pneumonitis related to immune modulator for his melanoma (nivolaub), brief cardiac arrest in ER.  History of present illness   68 year old male with history of CAD, ischemic cardiomyopathy, chronic systolic heart failure, history of VT now with indwelling pacemaker on chronic amiodarone, melanoma undergoing immune therapy.  He has been treated over the last 2 weeks as an outpatient for a right upper lobe pneumonia, initially treated with doxycycline but returned to PCP 3/4 with complaints of ongoing cough and shortness of breath and was changed to Levaquin.  He presented 3/5 to the ER with progressive shortness of breath, diaphoresis and dry cough.  ER he became increasingly dyspneic, with worsening air hunger and hypoxia.  He deteriorated rapidly requiring intubation and had brief cardiac arrest.  Postarrest he was slightly agitated but with purposeful movements, pulling at ET tube, opens eyes to name.  He remains significantly hypotensive with SBP in the 70s despite max Levophed and low-dose epi drip.  Lactate 4.8, WBC 15.8, creatinine 1.47.  He received 3 L total IV fluids, 125 mg IV Solu-Medrol, Rocephin and azithromycin IV in the ER.  Sister denies fever, purulent sputum, recent travel, exposure to people who have traveled, recent known sick contacts.  Past Medical History   has a past medical history of AICD (automatic cardioverter/defibrillator) present, Anxiety, CAD (coronary artery disease), CAD, NATIVE VESSEL (02/18/2009), Encounter for long-term (current) use of high-risk medication, Heart failure, systolic, acute on chronic ([place]),  ICD (implantable cardiac defibrillator), single, in situ, ICD - IN SITU (01/02/2009), ISCHEMIC CARDIOMYOPATHY (03/19/2009), LEFT VENTRICULAR MURAL THROMBUS (02/18/2009), Long term current use of aspirin, Melanoma ([place]) (2019), MI (myocardial infarction) ([place]) (08/2003), PAROXYSMAL VENTRICULAR TACHYCARDIA (03/19/2009), Pre-diabetes, SYSTOLIC HEART FAILURE, ACUTE ON CHRONIC (01/02/2009), TACHYCARDIA (01/02/2009), and Ventricular tachycardia ([place]).   Significant Hospital Events   3/5 >>cardiac arrest  Consults:    Procedures:    Significant Diagnostic Tests:  2D echo 3/5 >>  Micro Data:  Blood cultures X2 3/5 >> Urine 3/5 >> Sputum 3/5 >>  Antimicrobials:  Rocephin 3/5 x 1 Azithromycin 3/5>>> Cefepime 3/5>> Vancomycin 3/5 >>  Interim history/subjective:    Objective   Blood pressure (!) 89/66, pulse 60, temperature 99.4 F (37.4 C), temperature source Rectal, resp. rate (!) 30, height 5\' 11"  (1.803 m), weight 83.2 kg, SpO2 98 %.    Vent Mode: PRVC FiO2 (%):  [100 %] 100 % Set Rate:  [18 bmp-30 bmp] 30 bmp Vt Set:  [600 mL] 600 mL PEEP:  [5 cmH20-10 cmH20] 8 cmH20 Plateau Pressure:  [20 cmH20] 20 cmH20   Intake/Output Summary (Last 24 hours) at 02/01/2019 1740 Last data filed at 02/01/2019 8144 Gross per 24 hour  Intake 2000 ml  Output -  Net 2000 ml   Filed Weights   02/01/19 0837  Weight: 83.2 kg    Examination: General: Acutely ill-appearing male, post cardiac arrest on multiple pressors HENT: mucous membranes moist, ETT, no JVD Lungs: Diminished bases, coarse rhonchi right >left, respirations are even and nonlabored on the vent Cardiovascular: S1-S2 RRR, paced rhythm on the monitor Abdomen: Soft, nontender Extremities: Warm, dry, slightly mottled, dusky Neuro: Now sedated, RASS -1, was awake  and mildly agitated postarrest, not following commands but with purposeful movements, opens eyes   Resolved Hospital Problem list     Assessment & Plan:  Septic shock -  presumed related to severe CAP Plan- Volume resuscitation-has received 3 L total Trend lactate, procalcitonin IV antibiotics as above Levophed-titrate as needed for map rater than sign 67 Continue epi drip-wean off as able 1 amp bicarb-given Stat cortisol Monitor CVP's  Acute hypoxic respiratory failure RUL CAP-failed outpatient doxycycline and Levaquin Plan- Vent support - 8cc/kg  F/u CXR  F/u ABG PEEP 10, 100% FiO2 Broad-spectrum antibiotics as above Trend cultures Trend procalcitonin Some question as to the possibility of autoimmune pneumonitis related to nivoluab  Consider addition of high dose steroids-received 125 mg IV Solu-Medrol in ER Chest x-ray Bronchodilators every 6 hours  Lactic acidosis Plan- 1 amp bicarb given Follow-up chemistry Consider addition bicarb drip Volume as above Follow-up lactate  cardiac arrest- in setting of respiratory failure and septic shock History of VT-status post ICD/pacer placement Ischemic cardiomyopathy Some purposeful movement and eye opening postarrest Plan- No hypothermia protocol at this time Pressors as above Trend troponin 2D echo Consider cards input- is [name] as outpatient Holding home amiodarone, Entresto, Coreg, Mexitil  Chronic systolic heart failure Plan- Echo as above Hold further volume resuscitation at 3L for now Monitor CVP's   History of diabetes Plan- ICU hyperglycemia protocol Holding home metformin, Jardiance  Sedation needs on vent  plan- Fentanyl drip PRN Versed Daily wake up assessment    Best practice:  Diet: N.p.o. Pain/Anxiety/Delirium protocol (if indicated): Ordered-fentanyl drip, PRN Versed, RASS goal -1 VAP protocol (if indicated): Ordered DVT prophylaxis: SQ Heparin GI prophylaxis: Protonix Glucose control: ICU hyperglycemia protocol Mobility: Bedrest Code Status: Full Family Communication: Updated sister and brother-in-law at bedside, sister is his only family and  [facility] POA Disposition:  ICU  Labs   CBC: Recent Labs  Lab 02/01/19 0604 02/01/19 0832  WBC 15.8*  --   NEUTROABS 14.1*  --   HGB 13.6 12.9*  HCT 43.8 38.0*  MCV 94.8  --   PLT 185  --     Basic Metabolic Panel: Recent Labs  Lab 02/01/19 0604 02/01/19 0832  NA 135 139  K 4.0 6.0*  CL 103  --   CO2 17*  --   GLUCOSE 272*  --   BUN 25*  --   CREATININE 1.47*  --   CALCIUM 8.5*  --    GFR: Estimated Creatinine Clearance: 53.4 mL/min (A) (by C-G formula based on SCr of 1.47 mg/dL (H)). Recent Labs  Lab 02/01/19 0604 02/01/19 0612 02/01/19 0811  WBC 15.8*  --   --   LATICACIDVEN  --  4.8* 7.7*    Liver Function Tests: Recent Labs  Lab 02/01/19 0604  AST 50*  ALT 40  ALKPHOS 128*  BILITOT 1.0  PROT 6.0*  ALBUMIN 2.7*   No results for input(s): LIPASE, AMYLASE in the last 168 hours. No results for input(s): AMMONIA in the last 168 hours.  ABG    Component Value Date/Time   PHART 7.042 (LL) 02/01/2019 0832   PCO2ART 31.7 (L) 02/01/2019 0832   PO2ART 75.0 (L) 02/01/2019 0832   HCO3 8.6 (L) 02/01/2019 0832   TCO2 10 (L) 02/01/2019 0832   ACIDBASEDEF 21.0 (H) 02/01/2019 0832   O2SAT 87.0 02/01/2019 0832     Coagulation Profile: No results for input(s): INR, PROTIME in the last 168 hours.  Cardiac Enzymes: No results for input(s): CKTOTAL,  CKMB, CKMBINDEX, TROPONINI in the last 168 hours.  HbA1C: Hgb A1c MFr Bld  Date/Time Value Ref Range Status  01/19/2019 04:38 PM 7.0 (H) <5.7 % of total Hgb Final    Comment:    For someone without known diabetes, a hemoglobin A1c value of 6.5% or greater indicates that they may have  diabetes and this should be confirmed with a follow-up  test. . For someone with known diabetes, a value <7% indicates  that their diabetes is well controlled and a value  greater than or equal to 7% indicates suboptimal  control. A1c targets should be individualized based on  duration of diabetes, age, comorbid conditions, and    other considerations. . Currently, no consensus exists regarding use of hemoglobin A1c for diagnosis of diabetes for children. .   09/28/2018 09:11 AM 7.2 (H) 4.8 - 5.6 % Final    Comment:    (NOTE) Pre diabetes:          5.7%-6.4% Diabetes:              >6.4% Glycemic control for   <7.0% adults with diabetes     CBG: No results for input(s): GLUCAP in the last 168 hours.  Review of Systems:   As above obtained from sister, EDP and ER RN.   As per HPI - All other systems reviewed and were neg.     Past Medical History  He,  has a past medical history of AICD (automatic cardioverter/defibrillator) present, Anxiety, CAD (coronary artery disease), CAD, NATIVE VESSEL (02/18/2009), Encounter for long-term (current) use of high-risk medication, Heart failure, systolic, acute on chronic ([place]), ICD (implantable cardiac defibrillator), single, in situ, ICD - IN SITU (01/02/2009), ISCHEMIC CARDIOMYOPATHY (03/19/2009), LEFT VENTRICULAR MURAL THROMBUS (02/18/2009), Long term current use of aspirin, Melanoma ([place]) (2019), MI (myocardial infarction) ([place]) (08/2003), PAROXYSMAL VENTRICULAR TACHYCARDIA (03/19/2009), Pre-diabetes, SYSTOLIC HEART FAILURE, ACUTE ON CHRONIC (01/02/2009), TACHYCARDIA (01/02/2009), and Ventricular tachycardia ([place]).   Surgical History    Past Surgical History:  Procedure Laterality Date  . ABLATION  03/2018   IN [place]  . AXILLARY LYMPH NODE DISSECTION Right 10/05/2018   RIGHT AXILLARY LYMPH NODE DISSECTION   . AXILLARY LYMPH NODE DISSECTION Right 10/05/2018   Procedure: RIGHT AXILLARY LYMPH NODE DISSECTION;  Surgeon: Stark Klein, MD;  Location: [facility];  Service: General;  Laterality: Right;  . CARDIAC CATHETERIZATION     01/30/18 [facility]): Occluded LAD. Patent LCX and RCA. Normal LVEDP 14 mmHg. Medical managment.  Randolm Idol / REPLACE / REMOVE PACEMAKER    . SKIN BIOPSY  2019   melanoma R shoulder  . TONSILLECTOMY AND ADENOIDECTOMY  1969     Social History    reports that he has quit smoking. He has never used smokeless tobacco. He reports current alcohol use. He reports that he does not use drugs.   Family History   His family history includes Alcohol abuse in his father; Alzheimer's disease in his maternal grandmother and mother; Diabetes in his father; Kidney Stones in his father.   Allergies No Known Allergies   Home Medications  Prior to Admission medications   Medication Sig Start Date End Date Taking? Authorizing Provider  acyclovir (ZOVIRAX) 800 MG tablet Take 400 mg by mouth every other day.    Yes [provider]  albuterol (PROVENTIL HFA;VENTOLIN HFA) 108 (90 Base) MCG/ACT inhaler Inhale 1-2 puffs into the lungs every 6 (six) hours as needed for wheezing or shortness of breath.   Yes [provider]  amiodarone (PACERONE) 200 MG tablet Take 1 tablet (200 mg total) by mouth daily. 09/06/18  Yes Evans Lance, MD  aspirin 81 MG tablet Take 81 mg by mouth daily.     Yes [provider]  carvedilol (COREG) 6.25 MG tablet Take 1 tablet (6.25 mg total) by mouth 2 (two) times daily with a meal. 09/06/18  Yes Evans Lance, MD  empagliflozin (JARDIANCE) 10 MG TABS tablet Take 10 mg by mouth daily. 12/29/18  Yes [name], Junell C, MD  ipratropium (ATROVENT) 0.06 % nasal spray Place 2 sprays into both nostrils 3 (three) times daily. 09/11/18  Yes [name], [name], MD  levofloxacin (LEVAQUIN) 750 MG tablet Take 1 tablet (750 mg total) by mouth daily for 7 days. 01/31/19 02/07/19 Yes [name], [name], MD  metFORMIN (GLUCOPHAGE-XR) 500 MG 24 hr tablet Take 500 mg by mouth 2 (two) times daily.  11/24/18  Yes [provider]  mexiletine (MEXITIL) 150 MG capsule Take 1 capsule (150 mg total) by mouth 3 (three) times daily. 01/23/19  Yes Evans Lance, MD  rosuvastatin (CRESTOR) 10 MG tablet Take 10 mg by mouth every evening.  08/04/18  Yes [provider]  sacubitril-valsartan (ENTRESTO) 24-26 MG Take 1  tablet by mouth 2 (two) times daily. 07/14/18  Yes Evans Lance, MD  Spacer/Aero-Holding Chambers (E-Z SPACER) inhaler Use as instructed 01/31/19   [name], MD     Critical care time: 38mins      Katy Whiteheart, NP 02/01/2019  9:38 AM Pager: [phone] or [phone]  Attending Note:  68 year old male with extensive PMH on immune modulator for melanoma who presents with PNA,VDRF and cardiac arrest in the ER due to respiratory failure.  On exam, he is unresponsive with diffuse crackles.  I reviewed CXR myself, ETT is in a good position and infiltrate noted.  Discussed with PCCM-NP.  Patient is also in septic shock.  Will change to HCAP coverage with zithromax.  Continue full vent support.  Bicarb drip.  Adjust vent for ABG.  Epi and norepi, will add vaso and d/c epi.  Bicarb drip started.  PCCM will admit.  The patient is critically ill with multiple organ systems failure and requires high complexity decision making for assessment and support, frequent evaluation and titration of therapies, application of advanced monitoring technologies and extensive interpretation of multiple databases.   Critical Care Time devoted to patient care services described in this note is  45  Minutes. This time reflects time of care of this signee Dr Jennet Maduro. This critical care time does not reflect procedure time, or teaching time or supervisory time of PA/NP/Med student/Med Resident etc but could involve care discussion time.  Rush Farmer, M.D. [facility] Pulmonary/Critical Care Medicine. Pager: [phone]. After hours pager: [phone].

## 2019-02-01 NOTE — ED Notes (Addendum)
ED TO INPATIENT HANDOFF REPORT  ED Nurse Name and Phone #:  Jenny Reichmann RN [phone]  S Name/Age/Gender Lee Hall 68 y.o. male Room/Bed: RESUSC/RESUSC  Code Status   Code Status: Full Code  Home/SNF/Other  Patient oriented to: self Is this baseline? No   Triage Complete: Triage complete  Chief Complaint sob  Triage Note Pt comes via [facility], c/o SOB that started around 3am, hx of CHF, noticed swelling in his lower legs, pt hypotensive on EMS arrival 80 systolic, received [id number] fluid PTA.    Allergies No Known Allergies  Level of Care/Admitting Diagnosis ED Disposition    ED Disposition Condition [facility] Area: [place] [100100]  Level of Care: ICU [6]  Diagnosis: Septic shock [facility]) [[id number]]  Admitting Physician: Lady Saucier  Attending Physician: Rush Farmer [phone]  Estimated length of stay: 3 - 4 days  Certification:: I certify this patient will need inpatient services for at least 2 midnights  PT Class (Do Not Modify): Inpatient [101]  PT Acc Code (Do Not Modify): Private [1]       B Medical/Surgery History Past Medical History:  Diagnosis Date  . AICD (automatic cardioverter/defibrillator) present    [employer]  . Anxiety   . CAD (coronary artery disease)   . CAD, NATIVE VESSEL 02/18/2009   Qualifier: Diagnosis of  By: Stevie Kern NP-C, Sharyn Lull    . Encounter for long-term (current) use of high-risk medication   . Heart failure, systolic, acute on chronic ([place])   . ICD (implantable cardiac defibrillator), single, in situ   . ICD - IN SITU 01/02/2009   Qualifier: Diagnosis of  By: Peri Maris    . ISCHEMIC CARDIOMYOPATHY 03/19/2009   Qualifier: Diagnosis of  By: Boyce Medici RN, BSN, [place]    . LEFT VENTRICULAR MURAL THROMBUS 02/18/2009   Qualifier: Diagnosis of  By: Stevie Kern NP-C, Sharyn Lull    . Long term current use of aspirin   . Melanoma ([place]) 2019  . MI (myocardial infarction) ([place])  08/2003  . PAROXYSMAL VENTRICULAR TACHYCARDIA 03/19/2009   Qualifier: Diagnosis of  By: Boyce Medici, RN, BSN, [place]    . Pre-diabetes   . SYSTOLIC HEART FAILURE, ACUTE ON CHRONIC 01/02/2009   Qualifier: Diagnosis of  By: Stevie Kern NP-C, Sharyn Lull    . TACHYCARDIA 01/02/2009   Qualifier: Diagnosis of  By: Peri Maris    . Ventricular tachycardia [facility])    Past Surgical History:  Procedure Laterality Date  . ABLATION  03/2018   IN [place]  . AXILLARY LYMPH NODE DISSECTION Right 10/05/2018   RIGHT AXILLARY LYMPH NODE DISSECTION   . AXILLARY LYMPH NODE DISSECTION Right 10/05/2018   Procedure: RIGHT AXILLARY LYMPH NODE DISSECTION;  Surgeon: Stark Klein, MD;  Location: [place];  Service: General;  Laterality: Right;  . CARDIAC CATHETERIZATION     01/30/18 [facility]): Occluded LAD. Patent LCX and RCA. Normal LVEDP 14 mmHg. Medical managment.  Randolm Idol / REPLACE / REMOVE PACEMAKER    . SKIN BIOPSY  2019   melanoma R shoulder  . TONSILLECTOMY AND ADENOIDECTOMY  1969     A IV Location/Drains/Wounds Patient Lines/Drains/Airways Status   Active Line/Drains/Airways    Name:   Placement date:   Placement time:   Site:   Days:   Peripheral IV 02/01/19 Right Antecubital   02/01/19    0545    Antecubital   less than 1   Peripheral IV 02/01/19 Left Antecubital  02/01/19    0807    Antecubital   less than 1   Closed System Drain Lateral;Right RUQ Bulb (JP)   10/05/18    1638    RUQ   119   NG/OG Tube Orogastric 18 Fr.    02/01/19    0816    -   less than 1   Airway 7.5 mm   02/01/19    0754     less than 1   Incision (Closed) 10/05/18 Axilla   10/05/18    1236     119          Intake/Output Last 24 hours  Intake/Output Summary (Last 24 hours) at 02/01/2019 0926 Last data filed at 02/01/2019 0836 Gross per 24 hour  Intake 2000 ml  Output -  Net 2000 ml    Labs/Imaging Results for orders placed or performed during the hospital encounter of 02/01/19 (from the past 48 hour(s))   Comprehensive metabolic panel     Status: Abnormal   Collection Time: 02/01/19  6:04 AM  Result Value Ref Range   Sodium 135 135 - 145 mmol/L   Potassium 4.0 3.5 - 5.1 mmol/L   Chloride 103 98 - 111 mmol/L   CO2 17 (L) 22 - 32 mmol/L   Glucose, Bld 272 (H) 70 - 99 mg/dL   BUN 25 (H) 8 - 23 mg/dL   Creatinine, Ser 1.47 (H) 0.61 - 1.24 mg/dL   Calcium 8.5 (L) 8.9 - 10.3 mg/dL   Total Protein 6.0 (L) 6.5 - 8.1 g/dL   Albumin 2.7 (L) 3.5 - 5.0 g/dL   AST 50 (H) 15 - 41 U/L   ALT 40 0 - 44 U/L   Alkaline Phosphatase 128 (H) 38 - 126 U/L   Total Bilirubin 1.0 0.3 - 1.2 mg/dL   GFR calc non Af Amer 49 (L) >60 mL/min   GFR calc Af Amer 57 (L) >60 mL/min   Anion gap 15 5 - 15    Comment: Performed at [facility], [address] [place], [place] [address]  CBC WITH DIFFERENTIAL     Status: Abnormal   Collection Time: 02/01/19  6:04 AM  Result Value Ref Range   WBC 15.8 (H) 4.0 - 10.5 K/uL   RBC 4.62 4.22 - 5.81 MIL/uL   Hemoglobin 13.6 13.0 - 17.0 g/dL   HCT 43.8 39.0 - 52.0 %   MCV 94.8 80.0 - 100.0 fL   MCH 29.4 26.0 - 34.0 pg   MCHC 31.1 30.0 - 36.0 g/dL   RDW 14.2 11.5 - 15.5 %   Platelets 185 150 - 400 K/uL   nRBC 0.0 0.0 - 0.2 %   Neutrophils Relative % 91 %   Neutro Abs 14.1 (H) 1.7 - 7.7 K/uL   Lymphocytes Relative 5 %   Lymphs Abs 0.8 0.7 - 4.0 K/uL   Monocytes Relative 4 %   Monocytes Absolute 0.7 0.1 - 1.0 K/uL   Eosinophils Relative 0 %   Eosinophils Absolute 0.0 0.0 - 0.5 K/uL   Basophils Relative 0 %   Basophils Absolute 0.0 0.0 - 0.1 K/uL   Immature Granulocytes 0 %   Abs Immature Granulocytes 0.07 0.00 - 0.07 K/uL    Comment: Performed at [facility], [address], [place], [place] [address]  Lactic acid, plasma     Status: Abnormal   Collection Time: 02/01/19  6:12 AM  Result Value Ref Range   Lactic Acid, Venous  4.8 (HH) 0.5 - 1.9 mmol/L    Comment: CRITICAL RESULT CALLED TO, READ BACK BY AND VERIFIED WITH: Prajwal Fellner,C RN 02/01/2019 0727  JORDANS Performed at [facility], [place] [address] [place], [place] [address]   I-STAT 7, (LYTES, BLD GAS, ICA, H+H)     Status: Abnormal   Collection Time: 02/01/19  8:32 AM  Result Value Ref Range   pH, Arterial 7.042 (LL) 7.350 - 7.450   pCO2 arterial 31.7 (L) 32.0 - 48.0 mmHg   pO2, Arterial 75.0 (L) 83.0 - 108.0 mmHg   Bicarbonate 8.6 (L) 20.0 - 28.0 mmol/L   TCO2 10 (L) 22 - 32 mmol/L   O2 Saturation 87.0 %   Acid-base deficit 21.0 (H) 0.0 - 2.0 mmol/L   Sodium 139 135 - 145 mmol/L   Potassium 6.0 (H) 3.5 - 5.1 mmol/L   Calcium, Ion 0.96 (L) 1.15 - 1.40 mmol/L   HCT 38.0 (L) 39.0 - 52.0 %   Hemoglobin 12.9 (L) 13.0 - 17.0 g/dL   Patient temperature 98.6 F    Collection site BRACHIAL ARTERY    Drawn by Operator    Sample type ARTERIAL    Comment NOTIFIED PHYSICIAN    Dg Chest 2 View  Result Date: 01/31/2019 CLINICAL DATA:  Cough EXAM: CHEST - 2 VIEW COMPARISON:  01/19/2019 FINDINGS: Airspace disease throughout the right lung worsened. Increased linear markings at the left lung base. Small bilateral pleural effusions. Left subclavian AICD device. Borderline cardiomegaly. No pneumothorax. IMPRESSION: Worsening airspace disease at the right base. Increasing atelectasis versus airspace disease at the left base. Small bilateral pleural effusions. Electronically Signed   By: Marybelle Killings M.D.   On: 01/31/2019 15:53   Dg Chest Portable 1 View  Result Date: 02/01/2019 CLINICAL DATA:  Endotracheal tube placement EXAM: PORTABLE CHEST 1 VIEW COMPARISON:  Earlier today FINDINGS: New endotracheal tube with tip at the clavicular heads. Dual-chamber pacer leads from the left in stable position. Unchanged diffuse interstitial opacity with asymmetric airspace disease on the right. Cardiomegaly. IMPRESSION: 1. New endotracheal tube with tip at the clavicular heads. 2. Unchanged interstitial and airspace disease compared to earlier today. Electronically Signed   By: Monte Fantasia M.D.   On:  02/01/2019 08:37   Dg Chest Port 1 View  Result Date: 02/01/2019 CLINICAL DATA:  Shortness of breath EXAM: PORTABLE CHEST 1 VIEW COMPARISON:  Yesterday FINDINGS: Asymmetric right-sided airspace opacity. No lung opacity on PET-CT 09/04/2018. Small pleural effusions greater on the right. Diffuse interstitial coarsening with [employer]. Chronic cardiomegaly with dual-chamber pacer leads. There is duplicated right ventricular ICD leads. IMPRESSION: 1. Pulmonary edema and small right pleural effusion. 2. Asymmetric edema versus pneumonia on the right that has progressed from prior. Electronically Signed   By: Monte Fantasia M.D.   On: 02/01/2019 06:36    Pending Labs Unresulted Labs (From admission, onward)    Start     Ordered   02/02/19 0500  CBC  Tomorrow morning,   R     02/01/19 0909   02/02/19 3790  Basic metabolic panel  Tomorrow morning,   R     02/01/19 0909   02/02/19 0500  Magnesium  Tomorrow morning,   R     02/01/19 0909   02/02/19 0500  Phosphorus  Tomorrow morning,   R     02/01/19 0909   02/01/19 1000  Blood gas, arterial  Once,   R     02/01/19 0909   02/01/19 0912  Troponin I - Now Then Q6H  Now then every 6 hours,   R     02/01/19 0911   02/01/19 0907  Culture, respiratory (tracheal aspirate)  Once,   R     02/01/19 0909   02/01/19 0907  Respiratory Panel by PCR  (Respiratory virus panel with precautions)  Once,   R     02/01/19 0909   02/01/19 4098  Basic metabolic panel  ONCE - STAT,   R     02/01/19 0909   02/01/19 0906  Lactic acid, plasma  STAT Now then every 3 hours,   R     02/01/19 0909   02/01/19 0906  Procalcitonin  Once,   R     02/01/19 0909   02/01/19 0906  Cortisol  ONCE - STAT,   STAT     02/01/19 0909   02/01/19 0904  HIV antibody (Routine Testing)  Once,   R     02/01/19 0909   02/01/19 0604  Lactic acid, plasma  Now then every 2 hours,   STAT     02/01/19 0605   02/01/19 0604  Blood Culture (routine x 2)  BLOOD CULTURE X 2,   STAT     02/01/19  0605   02/01/19 0604  Urinalysis, Routine w reflex microscopic  ONCE - STAT,   STAT     02/01/19 0605          Vitals/Pain Today's Vitals   02/01/19 0908 02/01/19 0912 02/01/19 0915 02/01/19 0920  BP: (!) 72/62 (!) 71/62    Pulse: 60 60 60   Resp: (!) 30 (!) 30 (!) 30   Temp:      TempSrc:      SpO2: 100% 100% 100% 100%  Weight:      Height:      PainSc:        Isolation Precautions Droplet precaution  Medications Medications  norepinephrine (LEVOPHED) 4mg  in 252mL premix infusion (40 mcg/min Intravenous Rate/Dose Change 02/01/19 0832)  EPINEPHrine (ADRENALIN) 4 mg in dextrose 5 % 250 mL (0.016 mg/mL) infusion (5 mcg/min Intravenous Rate/Dose Change 02/01/19 0923)  vancomycin (VANCOCIN) 1,500 mg in sodium chloride 0.9 % 500 mL IVPB (has no administration in time range)  ceFEPIme (MAXIPIME) 2 g in sodium chloride 0.9 % 100 mL IVPB (has no administration in time range)  ceFEPIme (MAXIPIME) 2 g in sodium chloride 0.9 % 100 mL IVPB (has no administration in time range)  vancomycin (VANCOCIN) 1,500 mg in sodium chloride 0.9 % 500 mL IVPB (has no administration in time range)  heparin injection 5,000 Units (has no administration in time range)  pantoprazole sodium (PROTONIX) 40 mg/20 mL oral suspension 40 mg (has no administration in time range)  ipratropium-albuterol (DUONEB) 0.5-2.5 (3) MG/3ML nebulizer solution 3 mL (3 mLs Nebulization Given 02/01/19 0919)  fentaNYL (SUBLIMAZE) injection 50 mcg (has no administration in time range)  fentaNYL 2568mcg in NS 263mL (52mcg/ml) infusion-PREMIX (has no administration in time range)  fentaNYL (SUBLIMAZE) bolus via infusion 25 mcg (has no administration in time range)  midazolam (VERSED) injection 1 mg (has no administration in time range)  midazolam (VERSED) injection 1 mg (has no administration in time range)  insulin aspart (novoLOG) injection 2-6 Units (has no administration in time range)  sodium bicarbonate 1 mEq/mL injection (has no  administration in time range)  sodium chloride 0.9 % bolus 1,000 mL (0 mLs Intravenous Stopped 02/01/19 0709)  methylPREDNISolone sodium succinate (SOLU-MEDROL) 125 mg/2 mL injection 125  mg (125 mg Intravenous Given 02/01/19 0658)  sodium chloride 0.9 % bolus 1,000 mL (0 mLs Intravenous Stopped 02/01/19 0836)  sodium chloride 0.9 % bolus 1,000 mL (1,000 mLs Intravenous New Bag/Given 02/01/19 0739)  etomidate (AMIDATE) injection (20 mg Intravenous Given 02/01/19 0744)  succinylcholine (ANECTINE) injection (100 mg Intravenous Given 02/01/19 0745)  EPINEPHrine (ADRENALIN) 1 MG/10ML injection (1 Syringe Intravenous Given 02/01/19 0751)  sodium bicarbonate injection (50 mEq Intravenous Given 02/01/19 0754)  EPINEPHrine (ADRENALIN) 1 MG/10ML injection (1 Syringe Intravenous Given 02/01/19 0755)  fentaNYL (SUBLIMAZE) 100 MCG/2ML injection (100 mcg  Given 02/01/19 0848)  midazolam (VERSED) 2 MG/2ML injection (2 mg  Given 02/01/19 0850)  sodium bicarbonate injection 50 mEq (50 mEq Intravenous Given 02/01/19 0902)  sodium bicarbonate injection 50 mEq (50 mEq Intravenous Given by Other 02/01/19 0925)    Mobility Low fall risk   Focused Assessments Pulmonary Assessment Handoff:  Lung sounds: L Breath Sounds: Diminished R Breath Sounds: Clear, Diminished O2 Device: Ventilator        R Recommendations: See Admitting Provider Note  Report given to:   Additional Notes:  Central Line placement pending  Family at the bedside  Pt in non-violent restraints per MD

## 2019-02-01 NOTE — Consult Note (Addendum)
Cardiology Consultation:   Patient ID: Lee Hall MRN: [id number]; DOB: 1950/12/28  Admit date: 02/01/2019 Date of Consult: 02/01/2019  Primary Care Provider: [name], MD Primary Cardiologist: Will Meredith Leeds, MD  Primary Electrophysiologist:  None    Patient Profile:   Lee Hall is a 68 y.o. male with a hx of ICM s/p ICD/PPM, VT CAD with occluded LAD/collaterals, melanoma, who is being seen today for the evaluation of cardiac arrest and + troponin at the request of Dr. Nelda Marseille.  History of Present Illness:   Lee Hall is a 68 yo male with PMH noted above.  He is followed by Dr. Curt Bears and Dr. Lovena Le as an outpatient. He was living in [place] until the first of 2019. He has a history of coronary disease with an MI in 2005.  He has a chronic LAD occlusion with collaterals.  He had his initial ICD implanted in 2005 for VT as well as ischemic cardiomyopathy.  He was admitted to the hospital in February 2019 with VT where his sotalol was increased to 160 mg twice a day.  He developed recurrent episodes of VT and thus amiodarone was started, and sotalol stopped.  Notes indicated he had been on amiodarone in the past but was stopped secondary to thyroid issues.  Also on mexiletine.  His VT has been controlled with a combination of mexiletine and amiodarone.  At his office visit in August 2019 he reported he had been diagnosed with melanoma while in [place] several months prior and underwent excision.  He had a PET scan done with several areas that were abnormal and worrisome for local mets.  But he had not received any chemotherapy.  He was last seen in the office on 01/23/2019 and reported having upper respiratory symptoms.  He was continued on his home medications without changes.  He presented to the ED on 02/01/2019 with worsening shortness of breath and fatigue.  Notes indicate he was being treated for community-acquired pneumonia which was not responding  to outpatient medical treatment.  He was initially on doxycycline, but had a follow-up and was switched to levofloxacin.  In the ER he became increasingly diaphoretic, with worsening hypoxia.  He deteriorated rapidly ultimately requiring intubation and had a brief cardiac arrest with 1 cycle of CPR and epinephrine given.  Postarrest he was reported to be agitated with purposeful movement.  He was significantly hypotensive with systolic blood pressures in the 70s despite Levophed and low-dose epi.  Labs on admission showed lactic acid 4.8>> 7.7>> 9.3, stable electrolytes, creatinine 1.47>> 1.79, WBC 15.8, troponin 0.06.  EKG showed sinus rhythm with paced AV beats.  He received a total of 3 L of IV fluid, Solu-Medrol and IV antibiotics while in the ED.  He was admitted to pulmonary critical care for further management.  Past Medical History:  Diagnosis Date  . AICD (automatic cardioverter/defibrillator) present    [employer]  . Anxiety   . CAD (coronary artery disease)   . CAD, NATIVE VESSEL 02/18/2009   Qualifier: Diagnosis of  By: Stevie Kern NP-C, Sharyn Lull    . Encounter for long-term (current) use of high-risk medication   . Heart failure, systolic, acute on chronic ([place])   . ICD (implantable cardiac defibrillator), single, in situ   . ICD - IN SITU 01/02/2009   Qualifier: Diagnosis of  By: Peri Maris    . ISCHEMIC CARDIOMYOPATHY 03/19/2009   Qualifier: Diagnosis of  By: Boyce Medici RN, BSN, [place]    .  LEFT VENTRICULAR MURAL THROMBUS 02/18/2009   Qualifier: Diagnosis of  By: Stevie Kern NP-C, Sharyn Lull    . Long term current use of aspirin   . Melanoma ([place]) 2019  . MI (myocardial infarction) ([place]) 08/2003  . PAROXYSMAL VENTRICULAR TACHYCARDIA 03/19/2009   Qualifier: Diagnosis of  By: Boyce Medici, RN, BSN, [place]    . Pre-diabetes   . SYSTOLIC HEART FAILURE, ACUTE ON CHRONIC 01/02/2009   Qualifier: Diagnosis of  By: Stevie Kern NP-C, Sharyn Lull    . TACHYCARDIA 01/02/2009   Qualifier: Diagnosis of   By: Peri Maris    . Ventricular tachycardia [facility])     Past Surgical History:  Procedure Laterality Date  . ABLATION  03/2018   IN [place]  . AXILLARY LYMPH NODE DISSECTION Right 10/05/2018   RIGHT AXILLARY LYMPH NODE DISSECTION   . AXILLARY LYMPH NODE DISSECTION Right 10/05/2018   Procedure: RIGHT AXILLARY LYMPH NODE DISSECTION;  Surgeon: Stark Klein, MD;  Location: [place];  Service: General;  Laterality: Right;  . CARDIAC CATHETERIZATION     01/30/18 [facility]): Occluded LAD. Patent LCX and RCA. Normal LVEDP 14 mmHg. Medical managment.  Randolm Idol / REPLACE / REMOVE PACEMAKER    . SKIN BIOPSY  2019   melanoma R shoulder  . TONSILLECTOMY AND ADENOIDECTOMY  1969     Home Medications:  Prior to Admission medications   Medication Sig Start Date End Date Taking? Authorizing Provider  acyclovir (ZOVIRAX) 800 MG tablet Take 400 mg by mouth every other day.    Yes [provider]  albuterol (PROVENTIL HFA;VENTOLIN HFA) 108 (90 Base) MCG/ACT inhaler Inhale 1-2 puffs into the lungs every 6 (six) hours as needed for wheezing or shortness of breath.   Yes [provider]  amiodarone (PACERONE) 200 MG tablet Take 1 tablet (200 mg total) by mouth daily. 09/06/18  Yes Evans Lance, MD  aspirin 81 MG tablet Take 81 mg by mouth daily.     Yes [provider]  carvedilol (COREG) 6.25 MG tablet Take 1 tablet (6.25 mg total) by mouth 2 (two) times daily with a meal. 09/06/18  Yes Evans Lance, MD  empagliflozin (JARDIANCE) 10 MG TABS tablet Take 10 mg by mouth daily. 12/29/18  Yes [name], Junell C, MD  ipratropium (ATROVENT) 0.06 % nasal spray Place 2 sprays into both nostrils 3 (three) times daily. 09/11/18  Yes [name], [name], MD  levofloxacin (LEVAQUIN) 750 MG tablet Take 1 tablet (750 mg total) by mouth daily for 7 days. 01/31/19 02/07/19 Yes [name], [name], MD  metFORMIN (GLUCOPHAGE-XR) 500 MG 24 hr tablet Take 500 mg by mouth 2 (two) times  daily.  11/24/18  Yes [provider]  mexiletine (MEXITIL) 150 MG capsule Take 1 capsule (150 mg total) by mouth 3 (three) times daily. 01/23/19  Yes Evans Lance, MD  rosuvastatin (CRESTOR) 10 MG tablet Take 10 mg by mouth every evening.  08/04/18  Yes [provider]  sacubitril-valsartan (ENTRESTO) 24-26 MG Take 1 tablet by mouth 2 (two) times daily. 07/14/18  Yes Evans Lance, MD  Spacer/Aero-Holding Chambers (E-Z SPACER) inhaler Use as instructed 01/31/19   [name], MD    Inpatient Medications: Scheduled Meds: . heparin  5,000 Units Subcutaneous Q8H  . insulin aspart  2-6 Units Subcutaneous Q4H  . ipratropium-albuterol  3 mL Nebulization Q6H  . pantoprazole sodium  40 mg Per Tube Daily   Continuous Infusions: . [START ON 02/02/2019] azithromycin    . ceFEPime (MAXIPIME)  IV 2 g (02/01/19 1216)  . ceFEPime (MAXIPIME) IV    . epinephrine 2 mcg/min (02/01/19 1232)  . fentaNYL infusion INTRAVENOUS 200 mcg/hr (02/01/19 1053)  . midazolam 0.5 mg/hr (02/01/19 1226)  . norepinephrine (LEVOPHED) Adult infusion    .  sodium bicarbonate  infusion 1000 mL 100 mL/hr at 02/01/19 1213  . vancomycin    . [START ON 02/02/2019] vancomycin    . vasopressin (PITRESSIN) infusion - *FOR SHOCK* 0.03 Units/min (02/01/19 1154)   PRN Meds: fentaNYL, midazolam, midazolam  Allergies:   No Known Allergies  Social History:   Social History   Socioeconomic History  . Marital status: Single    Spouse name: Not on file  . Number of children: Not on file  . Years of education: Not on file  . Highest education level: Not on file  Occupational History  . Occupation: Unemployed since 10/2008  Social Needs  . Financial resource strain: Not on file  . Food insecurity:    Worry: Not on file    Inability: Not on file  . Transportation needs:    Medical: Not on file    Non-medical: Not on file  Tobacco Use  . Smoking status: Former Research scientist (life sciences)  . Smokeless tobacco: Never Used    Substance and Sexual Activity  . Alcohol use: Yes    Comment: 3 x week  . Drug use: No  . Sexual activity: Not Currently  Lifestyle  . Physical activity:    Days per week: Not on file    Minutes per session: Not on file  . Stress: Not on file  Relationships  . Social connections:    Talks on phone: Not on file    Gets together: Not on file    Attends religious service: Not on file    Active member of club or organization: Not on file    Attends meetings of clubs or organizations: Not on file    Relationship status: Not on file  . Intimate partner violence:    Fear of current or ex partner: Not on file    Emotionally abused: Not on file    Physically abused: Not on file    Forced sexual activity: Not on file  Other Topics Concern  . Not on file  Social History Narrative   Single   Lives with sister   No regular exercise    Family History:    Family History  Problem Relation Age of Onset  . Alzheimer's disease Mother   . Kidney Stones Father   . Diabetes Father   . Alcohol abuse Father   . Alzheimer's disease Maternal Grandmother      ROS:  Please see the history of present illness.   All other ROS reviewed and negative.     Physical Exam/Data:   Vitals:   02/01/19 0942 02/01/19 0945 02/01/19 1144 02/01/19 1213  BP: (!) 87/63  (!) 71/46   Pulse: (!) 58 (!) 59 60   Resp: (!) 27 (!) 25 (!) 30   Temp:    (!) 93.9 F (34.4 C)  TempSrc:    Rectal  SpO2: 92% 96% 100%   Weight:      Height:        Intake/Output Summary (Last 24 hours) at 02/01/2019 1235 Last data filed at 02/01/2019 0836 Gross per 24 hour  Intake 2000 ml  Output -  Net 2000 ml   Last 3 Weights 02/01/2019 01/31/2019 01/23/2019  Weight (lbs) 183 lb 6.8 oz 183  lb 8 oz 179 lb 9.6 oz  Weight (kg) 83.2 kg 83.235 kg 81.466 kg     Body mass index is 25.58 kg/m.  General:  Intubated, sedated older WM HEENT: ETT in place Neck: no JVD Endocrine:  No thryomegaly Cardiac:  normal S1, S2; RRR; no  murmur  Lungs: diminished with course bilateral breath sounds, wheezing/rhonchi R>L Abd: soft, nontender, no hepatomegaly  Ext: pale Musculoskeletal:  No deformities, BUE and BLE strength normal and equal Skin: warm and dry  Neuro: sedated  EKG:  The EKG was personally reviewed and demonstrates:  SR AV paced Telemetry:  Telemetry was personally reviewed and demonstrates:  V paced  Relevant CV Studies:   [place]  01/30/18 Findings: 1.  Left main: This was a large vessel, which bifurcated into the LAD and left circumflex.  There was no high-grade stenosis seen on angiography. 2.  Left circumflex: This was a moderate-sized vessel, which transverse through the AV groove as a moderate-sized vessel and then discontinued.  There was no high-grade stenosis seen on angiography. 3.  Left anterior descending artery: This artery was completely occluded at the ostium. 4.  Right coronary artery: This was a large vessel, which was dominant and gave rise to both PDA and the PLB.  There was no high-grade stenosis seen on angiography. Recommendations: 1.  Occluded left anterior descending artery 2.  Patent left circumflex and RCA 3.  Normal LVEDP at 14 mmHg 4.  Continue aggressive medical management  TTE: 12/09  SUMMARY - The left ventricle was mildly dilated. Overall left ventricular    systolic function was markedly decreased. Left ventricular    ejection fraction was estimated , range being 25 % to 35 %.    There was aneurysmal deformity of the entire periapical wall.    There was akinesis of the mid-distal septal wall. There was    akinesis of the mid-distal inferior wall. - Aortic valve thickness was mildly increased. There was mild    aortic valvular regurgitation. - There was mild mitral annular calcification. There was mild    mitral valvular regurgitation. - The left atrium was mildly dilated. - There was the appearance of a catheter  or pacing wire in the    right ventricle.  Laboratory Data:  Chemistry Recent Labs  Lab 02/01/19 0604 02/01/19 0832 02/01/19 1058 02/01/19 1208  NA 135 139 141 141  K 4.0 6.0* 4.7 4.7  CL 103  --  112*  --   CO2 17*  --  12*  --   GLUCOSE 272*  --  169*  --   BUN 25*  --  27*  --   CREATININE 1.47*  --  1.79*  --   CALCIUM 8.5*  --  6.6*  --   GFRNONAA 49*  --  39*  --   GFRAA 57*  --  45*  --   ANIONGAP 15  --  17*  --     Recent Labs  Lab 02/01/19 0604  PROT 6.0*  ALBUMIN 2.7*  AST 50*  ALT 40  ALKPHOS 128*  BILITOT 1.0   Hematology Recent Labs  Lab 02/01/19 0604 02/01/19 0832 02/01/19 1208  WBC 15.8*  --   --   RBC 4.62  --   --   HGB 13.6 12.9* 13.3  HCT 43.8 38.0* 39.0  MCV 94.8  --   --   MCH 29.4  --   --   MCHC 31.1  --   --  RDW 14.2  --   --   PLT 185  --   --    Cardiac Enzymes Recent Labs  Lab 02/01/19 1058  TROPONINI 0.06*   No results for input(s): TROPIPOC in the last 168 hours.  BNPNo results for input(s): BNP, PROBNP in the last 168 hours.  DDimer No results for input(s): DDIMER in the last 168 hours.  Radiology/Studies:  Dg Chest 2 View  Result Date: 01/31/2019 CLINICAL DATA:  Cough EXAM: CHEST - 2 VIEW COMPARISON:  01/19/2019 FINDINGS: Airspace disease throughout the right lung worsened. Increased linear markings at the left lung base. Small bilateral pleural effusions. Left subclavian AICD device. Borderline cardiomegaly. No pneumothorax. IMPRESSION: Worsening airspace disease at the right base. Increasing atelectasis versus airspace disease at the left base. Small bilateral pleural effusions. Electronically Signed   By: Marybelle Killings M.D.   On: 01/31/2019 15:53   Dg Chest Port 1 View  Result Date: 02/01/2019 CLINICAL DATA:  Follow-up right jugular central line placement EXAM: PORTABLE CHEST 1 VIEW COMPARISON:  02/01/2019 FINDINGS: Endotracheal tube and gastric catheter are seen in satisfactory position. Defibrillator is again  noted. Calcific changes along the cardiac margin are seen. Right jugular central line is noted without evidence of pneumothorax. Persistent opacity in the right lung is noted and stable. Increasing consolidation in the left retrocardiac region is noted. IMPRESSION: Status post gastric catheter placement and right jugular central line without evidence of pneumothorax. Stable airspace opacity within the right lung. Increasing consolidation left retrocardiac region is noted. Electronically Signed   By: Inez Catalina M.D.   On: 02/01/2019 11:30   Dg Chest Portable 1 View  Result Date: 02/01/2019 CLINICAL DATA:  Endotracheal tube placement EXAM: PORTABLE CHEST 1 VIEW COMPARISON:  Earlier today FINDINGS: New endotracheal tube with tip at the clavicular heads. Dual-chamber pacer leads from the left in stable position. Unchanged diffuse interstitial opacity with asymmetric airspace disease on the right. Cardiomegaly. IMPRESSION: 1. New endotracheal tube with tip at the clavicular heads. 2. Unchanged interstitial and airspace disease compared to earlier today. Electronically Signed   By: Monte Fantasia M.D.   On: 02/01/2019 08:37   Dg Chest Port 1 View  Result Date: 02/01/2019 CLINICAL DATA:  Shortness of breath EXAM: PORTABLE CHEST 1 VIEW COMPARISON:  Yesterday FINDINGS: Asymmetric right-sided airspace opacity. No lung opacity on PET-CT 09/04/2018. Small pleural effusions greater on the right. Diffuse interstitial coarsening with [employer]. Chronic cardiomegaly with dual-chamber pacer leads. There is duplicated right ventricular ICD leads. IMPRESSION: 1. Pulmonary edema and small right pleural effusion. 2. Asymmetric edema versus pneumonia on the right that has progressed from prior. Electronically Signed   By: Monte Fantasia M.D.   On: 02/01/2019 06:36    Assessment and Plan:   Lee Hall is a 68 y.o. male with a hx of ICM s/p ICD/PPM, VT CAD with occluded LAD/collaterals, melanoma, who is being  seen today for the evaluation of cardiac arrest and + troponin at the request of Dr. Nelda Marseille.  1. Septic Shock: felt to be 2/2 to CAP. Attempted treatment with outpatient therapy, but worsening symptoms presented to the ED. SBP 70s while on pressers in the ED. Lactic up to 9, WBC 15. CXR with worsening PNA. Given 3L in the ED -- antibiotics per primary -- remains on levo and epi gtts, blood pressures still soft  2. Acute respiratory failure: decompensated while in the ED requiring intubation.  -- PEEP 10, and weaned to FiO2 60 -- management per  PCCM  3. Elevated Troponin: Initial troponin 0.06. No reported chest pain prior to admission. Suspect demand ischemia in the setting of sepsis and known CAD.   4. ICM: Cath in 2019 with occluded LAD, collaterals from the PDA to dLAD. Coreg as an outpatient, held with hypotension. Monitor for signs of volume overload with need for fluid resuscitation, and IV meds -- echo 2/19 EF of 25%. -- echo pending this admission  5. VT s/p ICD: Not seen in the clinic for quite some time, but reestablished back in 2019. Hx of mexiletine and amio. On hold for now given his hypotension. No ectopy noted on telemetry.  6. HL: statin on hold as he is intubated at this time. Would resume when appropriate.  For questions or updates, please contact [place] Please consult [email or web address] for contact info under   Signed, Reino Bellis, NP  02/01/2019 12:35 PM   History and all data above reviewed.  Patient examined.  I agree with the findings as above.  Out patient records reviewed including cath from [facility] 2019.  Ischemic cardiomyopathy as above.  Now presents with sepsis and acute respiratory failure with intubation and presumed sepsis.  I spoke with patient's family.  They said he was coughing yesterday but did not appear to be in distress.  He called emergently in the morning with acute shortness of breath.  Events that unfolded as above after his presentation.    Currently he is intubated and sedated.  CVP is about 18.  There is been no urine output with Foley placed.  He is on vasopressors to include Levophed and epinephrine.  Echocardiogram is being performed.  He has had a known reduced ejection fraction as above but it appears to be even worse probably at 15%.  He has anterior apical akinesis is well is global hypokinesis.  He appears to have severe central MR.  The patient exam reveals COR:RRR  ,  Lungs: Decreased bowel sounds  ,  Abd:  Positive bowel sounds, no rebound no guarding, Ext No edema  .  All available labs, radiology testing, previous records reviewed. Agree with documented assessment and plan.  ACUTE RESPIRATORY FAILURE:   CXR with appearance of pneumonia rather than failure.  Hypotension likely related to sepsis.  Chronic LBBB.  No reason to expect acute coronary syndrome.  Troponins are nondiagnostic.  We will continue to trend.  Continue supportive care per CCM.  We did increase his pacing rate to 100 to try to improve cardiac output.  However, prognosis is very poor.  I discussed this with his sister and brother-in-law at the bedside.     Jeneen Rinks Cashlynn Yearwood  2:24 PM  02/01/2019

## 2019-02-01 NOTE — Code Documentation (Signed)
Called for STAT portable XR

## 2019-02-01 NOTE — Progress Notes (Signed)
CRITICAL VALUE ALERT  Critical Value: Lactic acid 9.3 troponin 0.06  Date & Time Notied:  02/01/19 1150  Provider Notified: Dr. Nelda Marseille  Orders Received/Actions taken: No new orders given at this time

## 2019-02-01 NOTE — Progress Notes (Signed)
[place] Progress Note Patient Name: Lee Hall DOB: 1951/03/13 MRN: [id number]   Date of Service  02/01/2019  HPI/Events of Note  Hypocalcemia  eICU Interventions  Order for calcium gluconate 2 gm iv x 1 placed        [place]oron[place]wo U Ogan 02/01/2019, 11:07 PM

## 2019-02-01 NOTE — Code Documentation (Signed)
Pulse check - pulse noted  CPR stopped

## 2019-02-01 NOTE — ED Provider Notes (Signed)
[facility] EMERGENCY DEPARTMENT Provider Note   CSN: [id number] Arrival date & time: 02/01/19  0544    History   Chief Complaint Chief Complaint  Patient presents with  . Shortness of Breath    HPI Lee Hall is a 68 y.o. male.   The history is provided by the patient.  Shortness of Breath  He has history of systolic heart failure with the presence of AICD and comes in because of dyspnea and weakness.  He has had a nonproductive cough for approximately the last 10 days.  He had a chest x-ray which showed pneumonia and he was put on an antibiotic.  He saw his PCP yesterday who repeated x-ray which showed worsening pneumonia and he was switched to a different antibiotic.  At about 3 AM, he started having sweats and severe dyspnea.  He denies chest pain and denies fever.  He denies recent travel or exposure to people who have traveled.  Past Medical History:  Diagnosis Date  . AICD (automatic cardioverter/defibrillator) present    [employer]  . Anxiety   . CAD (coronary artery disease)   . CAD, NATIVE VESSEL 02/18/2009   Qualifier: Diagnosis of  By: Stevie Kern NP-C, Sharyn Lull    . Encounter for long-term (current) use of high-risk medication   . Heart failure, systolic, acute on chronic ([place])   . ICD (implantable cardiac defibrillator), single, in situ   . ICD - IN SITU 01/02/2009   Qualifier: Diagnosis of  By: Peri Maris    . ISCHEMIC CARDIOMYOPATHY 03/19/2009   Qualifier: Diagnosis of  By: Boyce Medici RN, BSN, [place]    . LEFT VENTRICULAR MURAL THROMBUS 02/18/2009   Qualifier: Diagnosis of  By: Stevie Kern NP-C, Sharyn Lull    . Long term current use of aspirin   . Melanoma ([place]) 2019  . MI (myocardial infarction) ([place]) 08/2003  . PAROXYSMAL VENTRICULAR TACHYCARDIA 03/19/2009   Qualifier: Diagnosis of  By: Boyce Medici, RN, BSN, [place]    . Pre-diabetes   . SYSTOLIC HEART FAILURE, ACUTE ON CHRONIC 01/02/2009   Qualifier: Diagnosis of  By: Stevie Kern NP-C,  Sharyn Lull    . TACHYCARDIA 01/02/2009   Qualifier: Diagnosis of  By: Peri Maris    . Ventricular tachycardia [facility])     Patient Active Problem List   Diagnosis Date Noted  . Malignant melanoma metastatic to lymph node ([place]) 10/05/2018  . Tremor of right hand 08/07/2018  . Melanoma of skin ([place]) 08/07/2018  . Abnormal positron emission tomography (PET) scan 08/07/2018  . ISCHEMIC CARDIOMYOPATHY 03/19/2009  . PAROXYSMAL VENTRICULAR TACHYCARDIA 03/19/2009  . CAD, NATIVE VESSEL 02/18/2009  . LEFT VENTRICULAR MURAL THROMBUS 02/18/2009  . Chronic systolic heart failure ([place]) 01/02/2009  . TACHYCARDIA 01/02/2009  . Implantable cardioverter-defibrillator (ICD) in situ 01/02/2009    Past Surgical History:  Procedure Laterality Date  . ABLATION  03/2018   IN [place]  . AXILLARY LYMPH NODE DISSECTION Right 10/05/2018   RIGHT AXILLARY LYMPH NODE DISSECTION   . AXILLARY LYMPH NODE DISSECTION Right 10/05/2018   Procedure: RIGHT AXILLARY LYMPH NODE DISSECTION;  Surgeon: Stark Klein, MD;  Location: [facility];  Service: General;  Laterality: Right;  . CARDIAC CATHETERIZATION     01/30/18 [facility]): Occluded LAD. Patent LCX and RCA. Normal LVEDP 14 mmHg. Medical managment.  Randolm Idol / REPLACE / REMOVE PACEMAKER    . SKIN BIOPSY  2019   melanoma R shoulder  . Manuel Garcia  Home Medications    Prior to Admission medications   Medication Sig Start Date End Date Taking? Authorizing Provider  acyclovir (ZOVIRAX) 800 MG tablet Take 400 mg by mouth See admin instructions. Take 400 mg by mouth in the evening every other day    [provider]  amiodarone (PACERONE) 200 MG tablet Take 1 tablet (200 mg total) by mouth daily. 09/06/18   Evans Lance, MD  aspirin 81 MG tablet Take 81 mg by mouth daily.      [provider]  carvedilol (COREG) 6.25 MG tablet Take 1 tablet (6.25 mg total) by mouth 2 (two) times daily with a meal. 09/06/18    Evans Lance, MD  empagliflozin (JARDIANCE) 10 MG TABS tablet Take 10 mg by mouth daily. 12/29/18   [name], [name], MD  guaiFENesin-codeine (CHERATUSSIN AC) 100-10 MG/5ML syrup Take 5 mLs by mouth 3 (three) times daily as needed for cough. 01/29/19   [name], [name], MD  ipratropium (ATROVENT) 0.06 % nasal spray Place 2 sprays into both nostrils 3 (three) times daily. 09/11/18   [name], MD  levofloxacin (LEVAQUIN) 750 MG tablet Take 1 tablet (750 mg total) by mouth daily for 7 days. 01/31/19 02/07/19  [name], MD  metFORMIN (GLUCOPHAGE-XR) 500 MG 24 hr tablet Take 1 tablet by mouth 2 (two) times daily. 11/24/18   [provider]  mexiletine (MEXITIL) 150 MG capsule Take 1 capsule (150 mg total) by mouth 3 (three) times daily. 01/23/19   Evans Lance, MD  rosuvastatin (CRESTOR) 10 MG tablet Take 10 mg by mouth every evening.  08/04/18   [provider]  sacubitril-valsartan (ENTRESTO) 24-26 MG Take 1 tablet by mouth 2 (two) times daily. 07/14/18   Evans Lance, MD  Spacer/Aero-Holding Chambers (E-Z SPACER) inhaler Use as instructed 01/31/19   [name], MD    Family History Family History  Problem Relation Age of Onset  . Alzheimer's disease Mother   . Kidney Stones Father   . Diabetes Father   . Alcohol abuse Father   . Alzheimer's disease Maternal Grandmother     Social History Social History   Tobacco Use  . Smoking status: Former Research scientist (life sciences)  . Smokeless tobacco: Never Used  Substance Use Topics  . Alcohol use: Yes    Comment: 3 x week  . Drug use: No     Allergies   Patient has no known allergies.   Review of Systems Review of Systems  Respiratory: Positive for shortness of breath.   All other systems reviewed and are negative.    Physical Exam Updated Vital Signs BP (!) 72/56   Pulse 67   Temp 99.4 F (37.4 C) (Rectal)   Resp (!) 27   SpO2 100%   Physical Exam Vitals signs and nursing note reviewed.     68 year old male, resting comfortably and in no acute distress. Vital signs are significant for low blood pressure and rapid respiratory rate. Oxygen saturation is 100%, which is normal. Head is normocephalic and atraumatic. PERRLA, EOMI. Oropharynx is clear. Neck is nontender and supple without adenopathy or JVD. Back is nontender and there is no CVA tenderness. Lungs have rales diffusely throughout the right lung.  There are no wheezes or rhonchi. Chest is nontender. Heart has regular rate and rhythm without murmur. Abdomen is soft, flat, nontender without masses or hepatosplenomegaly and peristalsis is normoactive. Extremities have no cyanosis or edema, full range of motion is present.  Skin is somewhat clammy without rash. Neurologic: Mental status is normal, cranial nerves are intact, there are no motor or sensory deficits.  ED Treatments / Results  Labs (all labs ordered are listed, but only abnormal results are displayed) Labs Reviewed - No data to display  EKG EKG Interpretation  Date/Time:  Thursday February 01 2019 05:54:55 EST Ventricular Rate:  66 PR Interval:    QRS Duration: 182 QT Interval:  481 QTC Calculation: 504 R Axis:   66 Text Interpretation:  Sinus rhythm Ventricular premature complex Probable left atrial enlargement IVCD, consider atypical LBBB Baseline wander in lead(s) V6 When compared with ECG of 11/28/2008, Left bundle branch block is now present Confirmed by Delora Fuel ([id number]) on 02/01/2019 6:02:58 AM   Radiology Dg Chest 2 View  Result Date: 01/31/2019 CLINICAL DATA:  Cough EXAM: CHEST - 2 VIEW COMPARISON:  01/19/2019 FINDINGS: Airspace disease throughout the right lung worsened. Increased linear markings at the left lung base. Small bilateral pleural effusions. Left subclavian AICD device. Borderline cardiomegaly. No pneumothorax. IMPRESSION: Worsening airspace disease at the right base. Increasing atelectasis versus airspace disease at the left base.  Small bilateral pleural effusions. Electronically Signed   By: Marybelle Killings M.D.   On: 01/31/2019 15:53    Procedures Procedure Name: Intubation Date/Time: 02/01/2019 7:54 AM Performed by: Delora Fuel, MD Pre-anesthesia Checklist: Patient identified, Patient being monitored, Emergency Drugs available, Timeout performed and Suction available Oxygen Delivery Method: Non-rebreather mask Preoxygenation: Pre-oxygenation with 100% oxygen Induction Type: Rapid sequence Ventilation: Mask ventilation without difficulty Laryngoscope Size: 3 Grade View: Grade I Tube size: 7.5 mm Number of attempts: 1 Airway Equipment and Method: Video-laryngoscopy and Stylet Placement Confirmation: ETT inserted through vocal cords under direct vision,  CO2 detector and Breath sounds checked- equal and bilateral Secured at: 26 cm Tube secured with: ETT holder Dental Injury: Teeth and Oropharynx as per pre-operative assessment        Cardiopulmonary Resuscitation (CPR) Procedure Note Directed/Performed by: Delora Fuel I personally directed ancillary staff and/or performed CPR in an effort to regain return of spontaneous circulation and to maintain cardiac, neuro and systemic perfusion.   CRITICAL CARE Performed by: Delora Fuel Total critical care time: 145 minutes Critical care time was exclusive of separately billable procedures and treating other patients. Critical care was necessary to treat or prevent imminent or life-threatening deterioration. Critical care was time spent personally by me on the following activities: development of treatment plan with patient and/or surrogate as well as nursing, discussions with consultants, evaluation of patient's response to treatment, examination of patient, obtaining history from patient or surrogate, ordering and performing treatments and interventions, ordering and review of laboratory studies, ordering and review of radiographic studies, pulse oximetry and  re-evaluation of patient's condition.  Medications Ordered in ED Medications  norepinephrine (LEVOPHED) 4mg  in 278mL premix infusion (40 mcg/min Intravenous Rate/Dose Change 02/01/19 0832)  EPINEPHrine (ADRENALIN) 4 mg in dextrose 5 % 250 mL (0.016 mg/mL) infusion (2 mcg/min Intravenous Rate/Dose Change 02/01/19 0832)  fentaNYL (SUBLIMAZE) 100 MCG/2ML injection (has no administration in time range)  vancomycin (VANCOCIN) 1,500 mg in sodium chloride 0.9 % 500 mL IVPB (has no administration in time range)  ceFEPIme (MAXIPIME) 2 g in sodium chloride 0.9 % 100 mL IVPB (has no administration in time range)  midazolam (VERSED) 2 MG/2ML injection (has no administration in time range)  ceFEPIme (MAXIPIME) 2 g in sodium chloride 0.9 % 100 mL IVPB (has no administration in time range)  sodium chloride 0.9 %  bolus 1,000 mL (0 mLs Intravenous Stopped 02/01/19 0709)  methylPREDNISolone sodium succinate (SOLU-MEDROL) 125 mg/2 mL injection 125 mg (125 mg Intravenous Given 02/01/19 0658)  sodium chloride 0.9 % bolus 1,000 mL (0 mLs Intravenous Stopped 02/01/19 0836)  sodium chloride 0.9 % bolus 1,000 mL (1,000 mLs Intravenous New Bag/Given 02/01/19 0739)  etomidate (AMIDATE) injection (20 mg Intravenous Given 02/01/19 0744)  succinylcholine (ANECTINE) injection (100 mg Intravenous Given 02/01/19 0745)  EPINEPHrine (ADRENALIN) 1 MG/10ML injection (1 Syringe Intravenous Given 02/01/19 0751)  sodium bicarbonate injection (50 mEq Intravenous Given 02/01/19 0754)  EPINEPHrine (ADRENALIN) 1 MG/10ML injection (1 Syringe Intravenous Given 02/01/19 0755)     Initial Impression / Assessment and Plan / ED Course  I have reviewed the triage vital signs and the nursing notes.  Pertinent labs & imaging results that were available during my care of the patient were reviewed by me and considered in my medical decision making (see chart for details).  Community-acquired pneumonia which is not responding to outpatient management.  I have  reviewed the x-rays that had been done prior to coming to the ED, and there is significant worsening and some suggestion of perhaps some cavitation.  His initial antibiotic was doxycycline, and he was switched to levofloxacin.  Because of hypotension, he will be given aggressive fluids in spite of his history of heart failure.  Sepsis work-up is initiated and he is started on antibiotics for community-acquired pneumonia.  Chest x-ray is read by radiologist as showing pulmonary edema, but I believe it shows progression of pneumonia.  Clinically, he is not in heart failure.  Lactic acid level has come back elevated at 4.8, so additional fluids are given to achieve early, goal-directed fluid management.  Blood pressure initially came up to 91 systolic with fluids, but then dropped down to 62.  It did not respond to additional fluids and he was started on norepinephrine drip.  Patient became progressively more air hungry and became hypoxic in the spite of advancing his oxygen all the way up to a nonrebreather mask.  He was intubated.  Following intubation, he had an episode where pulses were not palpable in spite of ongoing paced rhythm.  CPR was initiated and he was given epinephrine, sodium bicarbonate with restoration of spontaneous circulation.  He was placed on an epinephrine drip.  Family had been present and were informed of the above-noted events.  Blood pressure was maintained at an adequate level with above-noted medications.  Post intubation x-ray showed adequate tube placement.  Labs showed elevated WBC of 15.8 with 91% neutrophils.  Family had mentioned that he is on an immune modulator because of his melanoma.  I looked that information up in past records and he is on nivoluab, and there is a risk of autoimmune pneumonitis from this medication.  He was given a dose of methylprednisolone to treat that.  Critical care service was consulted for inpatient management and may have come to see the patient and  admit him to ICU.  At this time, comprehensive metabolic panel was still pending.  Final Clinical Impressions(s) / ED Diagnoses   Final diagnoses:  Acute hypoxemic respiratory failure ([place])  Community acquired pneumonia of right lung, unspecified part of lung  Cardiac arrest ([place])  Severe sepsis [facility])    ED Discharge Orders    None       Delora Fuel, MD 82/50/53 0900

## 2019-02-01 NOTE — Procedures (Signed)
Arterial Catheter Insertion Procedure Note Wilmer Santillo [id number] Apr 01, 1951  Procedure: Insertion of Arterial Catheter  Indications: Blood pressure monitoring and Frequent blood sampling  Procedure Details Consent: Risks of procedure as well as the alternatives and risks of each were explained to the (patient/caregiver).  Consent for procedure obtained. Time Out: Verified patient identification, verified procedure, site/side was marked, verified correct patient position, special equipment/implants available, medications/allergies/relevent history reviewed, required imaging and test results available.  Performed  Maximum sterile technique was used including antiseptics, cap, gloves, gown, hand hygiene, mask and sheet. Skin prep: Chlorhexidine; local anesthetic administered 20 gauge catheter was inserted into left radial artery using the Seldinger technique. ULTRASOUND GUIDANCE USED: YES Evaluation Blood flow good; BP tracing good. Complications: No apparent complications.   Jennet Maduro 02/01/2019

## 2019-02-01 NOTE — Code Documentation (Signed)
Pulse check - no pulse noted  CPR resumed

## 2019-02-01 NOTE — Progress Notes (Signed)
CRITICAL VALUE ALERT  Critical Value:  Ca 6.2  Date & Time Notied:  02/01/19 2246  Provider Notified: Josie Saunders  Orders Received/Actions taken: Will look up pt and place orders.

## 2019-02-01 NOTE — ED Notes (Signed)
Pt thrashing all over the bed Agitated Pulling at tubes and lines

## 2019-02-01 NOTE — Progress Notes (Signed)
  Echocardiogram 2D Echocardiogram has been performed.  Tywan Siever G Cesareo Vickrey 02/01/2019, 3:09 PM

## 2019-02-01 NOTE — Procedures (Signed)
Central Venous Catheter Insertion Procedure Note Lee Hall [id number] 07/02/1951  Procedure: Insertion of Central Venous Catheter Indications: Assessment of intravascular volume, Drug and/or fluid administration and Frequent blood sampling  Procedure Details Consent: Risks of procedure as well as the alternatives and risks of each were explained to the (patient/caregiver).  Consent for procedure obtained. Time Out: Verified patient identification, verified procedure, site/side was marked, verified correct patient position, special equipment/implants available, medications/allergies/relevent history reviewed, required imaging and test results available.  Performed  Maximum sterile technique was used including antiseptics, cap, gloves, gown, hand hygiene, mask and sheet. Skin prep: Chlorhexidine; local anesthetic administered A antimicrobial bonded/coated triple lumen catheter was placed in the right internal jugular vein using the Seldinger technique.  Evaluation Blood flow good Complications: No apparent complications Patient did tolerate procedure well. Chest X-ray ordered to verify placement.  CXR: pending.  U/S used in placement  Rutledge Selsor 02/01/2019, 10:58 AM

## 2019-02-01 NOTE — ED Notes (Signed)
Pt resting, on vent. Levo going at 40, epi at 2. Pt's BP 95/65, HR paced at 60. Family at bedside.

## 2019-02-01 NOTE — Progress Notes (Signed)
PHARMACY NOTE:  ANTIMICROBIAL RENAL DOSAGE ADJUSTMENT  Current antimicrobial regimen includes a mismatch between antimicrobial dosage and estimated renal function.  As per policy approved by the Pharmacy & Therapeutics and Medical Executive Committees, the antimicrobial dosage will be adjusted accordingly.  Current antimicrobial dosage:   Cefepime 2g IV Q12hrs Vancomycin 1250mg  IV Q24hrs  Indication: Pneumonia   Renal Function:  Estimated Creatinine Clearance: 43.8 mL/min (A) (by C-G formula based on SCr of 1.79 mg/dL (H)).     Antimicrobial dosage has been changed to:   Cefepime 2g IV Q24hrs Vancomycin 1250mg  IV Q24hrs Calculated AUC = 511.8 Calculated Cmax = 33.4 Calculated Cmin = 13.4 SCR used = 1.79  Additional comments: Will continue to monitor for renal function, C&S, and clinical status    Thank you for allowing pharmacy to be a part of this patient's care.   Juanell Fairly, PharmD PGY1 Pharmacy Resident Phone [phone] 02/01/2019 3:50 PM

## 2019-02-01 NOTE — Progress Notes (Signed)
Pharmacy Antibiotic Note  Lee Hall is a 68 y.o. male admitted on 02/01/2019 with pneumonia. Pharmacy has been consulted for vancomycin and cefepime dosing. Pt is afebrile and WBC is elevated at 15.8. SCr is elevated above baseline at 1.47. Lactic acid is elevated. Pt was on levaquin PTA.   Plan: Vancomycin 1500mg  IV Q24H Cefepime 2gm IV Q12H F/u renal fxn, C&S, clinical status and peak/trough at SS  Height: 5\' 11"  (180.3 cm) Weight: 183 lb 6.8 oz (83.2 kg) IBW/kg (Calculated) : 75.3  Temp (24hrs), Avg:98.7 F (37.1 C), Min:97.9 F (36.6 C), Max:99.4 F (37.4 C)  Recent Labs  Lab 02/01/19 0604 02/01/19 0612  WBC 15.8*  --   CREATININE 1.47*  --   LATICACIDVEN  --  4.8*    Estimated Creatinine Clearance: 53.4 mL/min (A) (by C-G formula based on SCr of 1.47 mg/dL (H)).    No Known Allergies  Antimicrobials this admission: Vanc 3/5>> Cefepime 3/5>> Azithro x 1 3/5 CTX x 1 3/5  Dose adjustments this admission: N/A  Microbiology results: Pending  Thank you for allowing pharmacy to be a part of this patient's care.  Efe Fazzino, Rande Lawman 02/01/2019 8:58 AM

## 2019-02-02 ENCOUNTER — Telehealth: Payer: Self-pay

## 2019-02-02 ENCOUNTER — Inpatient Hospital Stay (HOSPITAL_COMMUNITY): Payer: Medicare HMO

## 2019-02-02 ENCOUNTER — Inpatient Hospital Stay: Payer: Medicare HMO

## 2019-02-02 ENCOUNTER — Inpatient Hospital Stay: Payer: Medicare HMO | Admitting: Oncology

## 2019-02-02 DIAGNOSIS — I248 Other forms of acute ischemic heart disease: Secondary | ICD-10-CM

## 2019-02-02 DIAGNOSIS — R6521 Severe sepsis with septic shock: Secondary | ICD-10-CM

## 2019-02-02 DIAGNOSIS — I5023 Acute on chronic systolic (congestive) heart failure: Secondary | ICD-10-CM

## 2019-02-02 DIAGNOSIS — A419 Sepsis, unspecified organism: Principal | ICD-10-CM

## 2019-02-02 LAB — URINALYSIS, ROUTINE W REFLEX MICROSCOPIC
Bilirubin Urine: NEGATIVE
Glucose, UA: >=500 mg/dL — AB
KETONES UR: NEGATIVE mg/dL
Leukocytes,Ua: NEGATIVE
Nitrite: NEGATIVE
PROTEIN: 100 mg/dL — AB
Specific Gravity, Urine: 1.024 (ref 1.005–1.030)
pH: 5 (ref 5.0–8.0)

## 2019-02-02 LAB — CBC WITH DIFFERENTIAL/PLATELET
Abs Immature Granulocytes: 0.07 10*3/uL (ref 0.00–0.07)
Basophils Absolute: 0 10*3/uL (ref 0.0–0.1)
Basophils Relative: 0 %
EOS PCT: 0 %
Eosinophils Absolute: 0 10*3/uL (ref 0.0–0.5)
HCT: 41.2 % (ref 39.0–52.0)
HEMOGLOBIN: 13.3 g/dL (ref 13.0–17.0)
Immature Granulocytes: 0 %
Lymphocytes Relative: 3 %
Lymphs Abs: 0.6 10*3/uL — ABNORMAL LOW (ref 0.7–4.0)
MCH: 28.9 pg (ref 26.0–34.0)
MCHC: 32.3 g/dL (ref 30.0–36.0)
MCV: 89.6 fL (ref 80.0–100.0)
Monocytes Absolute: 0.6 10*3/uL (ref 0.1–1.0)
Monocytes Relative: 4 %
Neutro Abs: 15 10*3/uL — ABNORMAL HIGH (ref 1.7–7.7)
Neutrophils Relative %: 93 %
Platelets: 126 10*3/uL — ABNORMAL LOW (ref 150–400)
RBC: 4.6 MIL/uL (ref 4.22–5.81)
RDW: 14.4 % (ref 11.5–15.5)
WBC: 16.2 10*3/uL — ABNORMAL HIGH (ref 4.0–10.5)
nRBC: 0.2 % (ref 0.0–0.2)

## 2019-02-02 LAB — BLOOD GAS, ARTERIAL
Acid-base deficit: 5.7 mmol/L — ABNORMAL HIGH (ref 0.0–2.0)
Bicarbonate: 17.4 mmol/L — ABNORMAL LOW (ref 20.0–28.0)
Drawn by: 550662
FIO2: 50
MECHVT: 600 mL
O2 Saturation: 98.6 %
PATIENT TEMPERATURE: 99.8
PEEP: 8 cmH2O
PO2 ART: 178 mmHg — AB (ref 83.0–108.0)
RATE: 30 resp/min
pCO2 arterial: 25.6 mmHg — ABNORMAL LOW (ref 32.0–48.0)
pH, Arterial: 7.452 — ABNORMAL HIGH (ref 7.350–7.450)

## 2019-02-02 LAB — GLUCOSE, CAPILLARY
GLUCOSE-CAPILLARY: 236 mg/dL — AB (ref 70–99)
GLUCOSE-CAPILLARY: 232 mg/dL — AB (ref 70–99)
Glucose-Capillary: 227 mg/dL — ABNORMAL HIGH (ref 70–99)
Glucose-Capillary: 230 mg/dL — ABNORMAL HIGH (ref 70–99)
Glucose-Capillary: 231 mg/dL — ABNORMAL HIGH (ref 70–99)
Glucose-Capillary: 237 mg/dL — ABNORMAL HIGH (ref 70–99)
Glucose-Capillary: 245 mg/dL — ABNORMAL HIGH (ref 70–99)

## 2019-02-02 LAB — PHOSPHORUS
Phosphorus: 5.8 mg/dL — ABNORMAL HIGH (ref 2.5–4.6)
Phosphorus: 5.4 mg/dL — ABNORMAL HIGH (ref 2.5–4.6)
Phosphorus: 4.5 mg/dL (ref 2.5–4.6)

## 2019-02-02 LAB — CBC
HCT: 43 % (ref 39.0–52.0)
Hemoglobin: 14 g/dL (ref 13.0–17.0)
MCH: 29.4 pg (ref 26.0–34.0)
MCHC: 32.6 g/dL (ref 30.0–36.0)
MCV: 90.1 fL (ref 80.0–100.0)
Platelets: 133 10*3/uL — ABNORMAL LOW (ref 150–400)
RBC: 4.77 MIL/uL (ref 4.22–5.81)
RDW: 14.4 % (ref 11.5–15.5)
WBC: 14.9 10*3/uL — ABNORMAL HIGH (ref 4.0–10.5)
nRBC: 0.2 % (ref 0.0–0.2)

## 2019-02-02 LAB — BASIC METABOLIC PANEL
Anion gap: 16 — ABNORMAL HIGH (ref 5–15)
BUN: 46 mg/dL — ABNORMAL HIGH (ref 8–23)
CO2: 15 mmol/L — ABNORMAL LOW (ref 22–32)
CREATININE: 2.55 mg/dL — AB (ref 0.61–1.24)
Calcium: 7.1 mg/dL — ABNORMAL LOW (ref 8.9–10.3)
Chloride: 107 mmol/L (ref 98–111)
GFR calc Af Amer: 29 mL/min — ABNORMAL LOW (ref >60)
GFR calc non Af Amer: 25 mL/min — ABNORMAL LOW (ref >60)
Glucose, Bld: 287 mg/dL — ABNORMAL HIGH (ref 70–99)
Potassium: 4.3 mmol/L (ref 3.5–5.1)
Sodium: 138 mmol/L (ref 135–145)

## 2019-02-02 LAB — MAGNESIUM
Magnesium: 2 mg/dL (ref 1.7–2.4)
Magnesium: 2.1 mg/dL (ref 1.7–2.4)
Magnesium: 2.1 mg/dL (ref 1.7–2.4)

## 2019-02-02 LAB — D-DIMER, QUANTITATIVE: D-Dimer, Quant: 0.49 ug{FEU}/mL (ref <0.50)

## 2019-02-02 LAB — HIV ANTIBODY (ROUTINE TESTING W REFLEX): HIV Screen 4th Generation wRfx: NONREACTIVE

## 2019-02-02 MED ORDER — VANCOMYCIN HCL 10 G IV SOLR
1500.0000 mg | INTRAVENOUS | Status: DC
  Administered 2019-02-02 – 2019-02-04 (×2): 1500 mg via INTRAVENOUS
  Filled 2019-02-02 (×2): qty 1500

## 2019-02-02 MED ORDER — INSULIN GLARGINE 100 UNIT/ML ~~LOC~~ SOLN
20.0000 [IU] | Freq: Every day | SUBCUTANEOUS | Status: DC
  Administered 2019-02-02 – 2019-02-03 (×2): 20 [IU] via SUBCUTANEOUS
  Filled 2019-02-02 (×3): qty 0.2

## 2019-02-02 MED ORDER — IPRATROPIUM-ALBUTEROL 0.5-2.5 (3) MG/3ML IN SOLN
3.0000 mL | RESPIRATORY_TRACT | Status: DC | PRN
  Administered 2019-02-05: 3 mL via RESPIRATORY_TRACT
  Filled 2019-02-02: qty 3

## 2019-02-02 MED ORDER — INSULIN ASPART 100 UNIT/ML ~~LOC~~ SOLN
3.0000 [IU] | SUBCUTANEOUS | Status: DC
  Administered 2019-02-02 (×4): 9 [IU] via SUBCUTANEOUS
  Administered 2019-02-03: 6 [IU] via SUBCUTANEOUS
  Administered 2019-02-03: 9 [IU] via SUBCUTANEOUS
  Administered 2019-02-03: 6 [IU] via SUBCUTANEOUS
  Administered 2019-02-03: 9 [IU] via SUBCUTANEOUS
  Administered 2019-02-03: 3 [IU] via SUBCUTANEOUS
  Administered 2019-02-03 – 2019-02-04 (×3): 9 [IU] via SUBCUTANEOUS
  Administered 2019-02-04: 3 [IU] via SUBCUTANEOUS
  Administered 2019-02-04: 6 [IU] via SUBCUTANEOUS
  Administered 2019-02-05: 3 [IU] via SUBCUTANEOUS
  Administered 2019-02-05: 6 [IU] via SUBCUTANEOUS
  Administered 2019-02-05: 3 [IU] via SUBCUTANEOUS
  Administered 2019-02-06: 6 [IU] via SUBCUTANEOUS
  Administered 2019-02-06 – 2019-02-07 (×3): 3 [IU] via SUBCUTANEOUS
  Administered 2019-02-07 (×2): 6 [IU] via SUBCUTANEOUS
  Administered 2019-02-07: 3 [IU] via SUBCUTANEOUS
  Administered 2019-02-07 (×2): 6 [IU] via SUBCUTANEOUS

## 2019-02-02 MED ORDER — METHYLPREDNISOLONE SODIUM SUCC 125 MG IJ SOLR
60.0000 mg | Freq: Four times a day (QID) | INTRAMUSCULAR | Status: DC
  Administered 2019-02-02 – 2019-02-04 (×8): 60 mg via INTRAVENOUS
  Filled 2019-02-02 (×8): qty 2

## 2019-02-02 MED ORDER — PRO-STAT SUGAR FREE PO LIQD
30.0000 mL | Freq: Two times a day (BID) | ORAL | Status: DC
  Administered 2019-02-02: 30 mL
  Filled 2019-02-02: qty 30

## 2019-02-02 MED ORDER — VITAL AF 1.2 CAL PO LIQD
1000.0000 mL | ORAL | Status: DC
  Administered 2019-02-02 – 2019-02-04 (×4): 1000 mL

## 2019-02-02 MED ORDER — VITAL HIGH PROTEIN PO LIQD: 1000.0000 mL | ORAL | Status: DC

## 2019-02-02 NOTE — Telephone Encounter (Signed)
-----   Message from Patsey Berthold, NP sent at 01/30/2019 12:52 PM EST ----- Elevated heart logic score, please enroll in ICM.  Thanks!

## 2019-02-02 NOTE — Progress Notes (Addendum)
Progress Note  Patient Name: Lee Hall Date of Encounter: 02/02/2019  Primary Cardiologist:   Will Meredith Leeds, MD   Subjective   Patient is intubated and sedated but moves and seems to cooperate with simple directions.  Inpatient Medications    Scheduled Meds: . chlorhexidine gluconate (MEDLINE KIT)  15 mL Mouth Rinse BID  . feeding supplement (PRO-STAT SUGAR FREE 64)  30 mL Per Tube BID  . feeding supplement (VITAL HIGH PROTEIN)  1,000 mL Per Tube Q24H  . heparin  5,000 Units Subcutaneous Q8H  . insulin aspart  3-9 Units Subcutaneous Q4H  . mouth rinse  15 mL Mouth Rinse 10 times per day  . methylPREDNISolone (SOLU-MEDROL) injection  60 mg Intravenous Q6H  . pantoprazole sodium  40 mg Per Tube Daily   Continuous Infusions: . azithromycin Stopped (02/02/19 0703)  . ceFEPime (MAXIPIME) IV    . fentaNYL infusion INTRAVENOUS 100 mcg/hr (02/02/19 1000)  . midazolam 0.5 mg/hr (02/02/19 1000)  . norepinephrine (LEVOPHED) Adult infusion 12 mcg/min (02/02/19 1000)  .  sodium bicarbonate  infusion 1000 mL 50 mL/hr at 02/02/19 1107  . [START ON 02/03/2019] vancomycin    . vasopressin (PITRESSIN) infusion - *FOR SHOCK* 0.03 Units/min (02/02/19 1000)   PRN Meds: fentaNYL, ipratropium-albuterol, midazolam   Vital Signs    Vitals:   02/02/19 0800 02/02/19 0900 02/02/19 1000 02/02/19 1100  BP:      Pulse: 100 (!) 101 (!) 103   Resp:   19   Temp: 99.5 F (37.5 C)   98.5 F (36.9 C)  TempSrc: Oral   Oral  SpO2: 96% 100% 97%   Weight:      Height:        Intake/Output Summary (Last 24 hours) at 02/02/2019 1132 Last data filed at 02/02/2019 1000 Gross per 24 hour  Intake 5181.01 ml  Output 775 ml  Net 4406.01 ml   Filed Weights   02/01/19 0837 02/02/19 0500  Weight: 83.2 kg 93 kg    Telemetry    Paced rhythm rate 100 - Personally Reviewed  ECG    NA - Personally Reviewed  Physical Exam   GEN: No acute distress.   Neck: No  JVD Cardiac: RRR, no  murmurs (distant heart sounds) Respiratory:    Bilateral decreased breath sounds with fine crackles GI:    Absent bowel sounds with mild distention.   MS:    Mild diffuse edema Neuro:   Intubated sedated.   Psych:    Unable to assess.    Labs    Chemistry Recent Labs  Lab 02/01/19 0604  02/01/19 1058  02/01/19 1950 02/01/19 2138 02/02/19 0528  NA 135   < > 141   < > 140 138 138  K 4.0   < > 4.7   < > 4.6 4.6 4.3  CL 103  --  112*  --   --  115* 107  CO2 17*  --  12*  --   --  13* 15*  GLUCOSE 272*  --  169*  --   --  277* 287*  BUN 25*  --  27*  --   --  39* 46*  CREATININE 1.47*  --  1.79*  --   --  2.23* 2.55*  CALCIUM 8.5*  --  6.6*  --   --  6.2* 7.1*  PROT 6.0*  --   --   --   --  4.9*  --   ALBUMIN 2.7*  --   --   --   --  2.3*  --   AST 50*  --   --   --   --  6,759*  --   ALT 40  --   --   --   --  3,747*  --   ALKPHOS 128*  --   --   --   --  162*  --   BILITOT 1.0  --   --   --   --  1.6*  --   GFRNONAA 49*  --  39*  --   --  30* 25*  GFRAA 57*  --  45*  --   --  35* 29*  ANIONGAP 15  --  17*  --   --  10 16*   < > = values in this interval not displayed.     Hematology Recent Labs  Lab 01/31/19 1628 02/01/19 0604  02/01/19 1208 02/01/19 1950 02/02/19 0528  WBC 6.2 15.8*  --   --   --  14.9*  RBC 4.61 4.62  --   --   --  4.77  HGB 13.8 13.6   < > 13.3 13.9 14.0  HCT 42.3 43.8   < > 39.0 41.0 43.0  MCV 91.9 94.8  --   --   --  90.1  MCH  --  29.4  --   --   --  29.4  MCHC 32.5 31.1  --   --   --  32.6  RDW 15.2 14.2  --   --   --  14.4  PLT 207.0 185  --   --   --  133*   < > = values in this interval not displayed.    Cardiac Enzymes Recent Labs  Lab 02/01/19 1058 02/01/19 1424 02/01/19 2138  TROPONINI 0.06* 0.19* 0.16*   No results for input(s): TROPIPOC in the last 168 hours.   BNP Recent Labs  Lab 01/31/19 1628  PROBNP 1,698.0*     DDimer  Recent Labs  Lab 01/31/19 1628  DDIMER 0.49     Radiology    Dg Chest 2  View  Result Date: 01/31/2019 CLINICAL DATA:  Cough EXAM: CHEST - 2 VIEW COMPARISON:  01/19/2019 FINDINGS: Airspace disease throughout the right lung worsened. Increased linear markings at the left lung base. Small bilateral pleural effusions. Left subclavian AICD device. Borderline cardiomegaly. No pneumothorax. IMPRESSION: Worsening airspace disease at the right base. Increasing atelectasis versus airspace disease at the left base. Small bilateral pleural effusions. Electronically Signed   By: Marybelle Killings M.D.   On: 01/31/2019 15:53   Dg Chest Port 1 View  Result Date: 02/02/2019 CLINICAL DATA:  Respiratory failure EXAM: PORTABLE CHEST 1 VIEW COMPARISON:  02/01/2019 FINDINGS: Left AICD, endotracheal tube, right central line and NG tube remain in place, unchanged. Cardiomegaly. Bilateral airspace disease again noted, right greater than left. Small bilateral effusions. No acute bony abnormality. IMPRESSION: Stable bilateral airspace disease, right greater than left and small effusions. Electronically Signed   By: Rolm Baptise M.D.   On: 02/02/2019 07:28   Dg Chest Port 1 View  Result Date: 02/01/2019 CLINICAL DATA:  Follow-up right jugular central line placement EXAM: PORTABLE CHEST 1 VIEW COMPARISON:  02/01/2019 FINDINGS: Endotracheal tube and gastric catheter are seen in satisfactory position. Defibrillator is again noted. Calcific changes along the cardiac margin are seen. Right jugular central line is noted without evidence of pneumothorax. Persistent opacity in the right lung is noted and stable. Increasing consolidation in the left retrocardiac region  is noted. IMPRESSION: Status post gastric catheter placement and right jugular central line without evidence of pneumothorax. Stable airspace opacity within the right lung. Increasing consolidation left retrocardiac region is noted. Electronically Signed   By: Inez Catalina M.D.   On: 02/01/2019 11:30   Dg Chest Portable 1 View  Result Date:  02/01/2019 CLINICAL DATA:  Endotracheal tube placement EXAM: PORTABLE CHEST 1 VIEW COMPARISON:  Earlier today FINDINGS: New endotracheal tube with tip at the clavicular heads. Dual-chamber pacer leads from the left in stable position. Unchanged diffuse interstitial opacity with asymmetric airspace disease on the right. Cardiomegaly. IMPRESSION: 1. New endotracheal tube with tip at the clavicular heads. 2. Unchanged interstitial and airspace disease compared to earlier today. Electronically Signed   By: Monte Fantasia M.D.   On: 02/01/2019 08:37   Dg Chest Port 1 View  Result Date: 02/01/2019 CLINICAL DATA:  Shortness of breath EXAM: PORTABLE CHEST 1 VIEW COMPARISON:  Yesterday FINDINGS: Asymmetric right-sided airspace opacity. No lung opacity on PET-CT 09/04/2018. Small pleural effusions greater on the right. Diffuse interstitial coarsening with [employer]. Chronic cardiomegaly with dual-chamber pacer leads. There is duplicated right ventricular ICD leads. IMPRESSION: 1. Pulmonary edema and small right pleural effusion. 2. Asymmetric edema versus pneumonia on the right that has progressed from prior. Electronically Signed   By: Monte Fantasia M.D.   On: 02/01/2019 06:36    Cardiac Studies   Echo 02/01/19  1. The left ventricle has severely reduced systolic function, with an ejection fraction of 20-25%. The cavity size was moderately dilated. Left ventricular diastology could not be evaluated due to nondiagnostic images. Elevated left atrial and left  ventricular end-diastolic pressures.  2. There is akinesis of the mid to apical inferior, anterior, lateral, inferolateral, apical and apical septal walls. There is akinesis of the mid anteroseptal and inferoseptal wall.  3. The right ventricle has normal systolic function. The cavity was normal. There is no increase in right ventricular wall thickness. Right ventricular systolic pressure is moderately elevated with an estimated pressure of 55.4 mmHg.  4.  Left atrial size was severely dilated.  5. The mitral valve is normal in structure. There is mild mitral annular calcification present. Mitral valve regurgitation is moderate to severe by color flow Doppler.  6. The tricuspid valve is normal in structure. Tricuspid valve regurgitation is mild-moderate.  7. The aortic valve is tricuspid Moderate thickening of the aortic valve Moderate calcification of the aortic valve. Aortic valve regurgitation is moderate by color flow Doppler.  8. The pulmonic valve was normal in structure.  9. The inferior vena cava was dilated in size with <50% respiratory variability  Patient Profile     68 y.o. male with a hx of ICM s/p ICD/PPM, VT CAD with occluded LAD/collaterals, melanoma, who is being seen for the evaluation of cardiac arrest and + troponin at the request of Dr. Nelda Marseille.  Assessment & Plan    SEPTIC SHOCK/RESPIRATORY FAILURE:  Continue maximal support per primary team.    ACUTE ON CHRONIC CHF:   EF reported as above.  I thought it was lower than this and lower than that reported on echo from 2019 in [place].  Note on that very brief echo report there was no mention of MR.  All prior meds on hold with shock.   Despite this he seems to have made progress.  Concerns are elevated liver enzymes from shock and progressive renal dysfunction.  Note:  We reprogrammed his pacer to increase his rate yesterday  when he was critically ill.  This should be turned back down and I will discuss with critical care the timing of this as he weans pressors.  Addendum:  Discussed with Dr. Halford Chessman and I will have the pacer turned back down.   ELEVATED TROPONIN:  Known CAD.  I suspect that this is demand ischemia.      For questions or updates, please contact [place] Please consult [email or web address] for contact info under Cardiology/STEMI.   Signed, Minus Breeding, MD  02/02/2019, 11:32 AM

## 2019-02-02 NOTE — Progress Notes (Addendum)
Inpatient Diabetes Program Recommendations  AACE/ADA: New Consensus Statement on Inpatient Glycemic Control (2015)  Target Ranges:  Prepandial:   less than 140 mg/dL      Peak postprandial:   less than 180 mg/dL (1-2 hours)      Critically ill patients:  140 - 180 mg/dL   Lab Results  Component Value Date   GLUCAP 230 (H) 02/02/2019   HGBA1C 7.0 (H) 01/19/2019    Results for ROMNEY, COMPEAN" (MRN [id number]) as of 02/02/2019 12:47  Ref. Range 02/01/2019 19:32 02/01/2019 23:35 02/02/2019 04:05 02/02/2019 07:38 02/02/2019 07:41 02/02/2019 11:05  Glucose-Capillary Latest Ref Range: 70 - 99 mg/dL 239 (H) 203 (H) 236 (H) 237 (H) 245 (H) 230 (H)     Review of Glycemic Control  Diabetes history: DM2  Outpatient Diabetes medications: Jardiance 10mg  daily; Metformin 500 mg BID   Current orders for Inpatient glycemic control: ICU phase 1 Novolog correction (3-9 units) Q4    Tube feeds starting on patient (currently 25 ml/hour with a goal of 75 ml/hour).     Per ICU Glycemic Control Protocol,  patient should be transitioned from phase 1 (sq insulin) to phase 2 (iv insulin) as past several blood sugars have all been over 200. Expect it to increase with tf being started.   Will make patient's RN aware to please move from phase 1 to phase 2 of this protocol.  Thank you.  -- Will follow during hospitalization.--  Jonna Clark RN, MSN Diabetes Coordinator Inpatient Glycemic Control Team Team Pager: [phone] (8am-5pm)

## 2019-02-02 NOTE — Progress Notes (Signed)
PHARMACY NOTE:  ANTIMICROBIAL RENAL DOSAGE ADJUSTMENT  Current antimicrobial regimen includes a mismatch between antimicrobial dosage and estimated renal function.  As per policy approved by the Pharmacy & Therapeutics and Medical Executive Committees, the antimicrobial dosage will be adjusted accordingly.  Current antimicrobial dosage:   Cefepime 2g IV Q12hrs Vancomycin 1250mg  IV Q24hrs Azithromycin 500mg  IV Q24hrs  Indication: Pneumonia   Renal Function:  Estimated Creatinine Clearance: 33.7 mL/min (A) (by C-G formula based on SCr of 2.55 mg/dL (H)).     Antimicrobial dosage has been changed to:   Vancomycin 1500mg  IV Q36hrs Calculated AUC = 499 Calculated Cmax = 34 Calculated Cmin = 12 SCR used = 2.55  Continue  Cefepime 2g IV Q24hrs  Azithromycin 500mg  IV Q24hrs  Additional comments: Will continue to monitor for renal function, C&S, and clinical status    Thank you for allowing pharmacy to be a part of this patient's care.   Juanell Fairly, PharmD PGY1 Pharmacy Resident Phone [phone] 02/02/2019 10:34 AM

## 2019-02-02 NOTE — Telephone Encounter (Signed)
Patient referred to [facility] clinic by Chanetta Marshall, NP. No ICM intro call to patient today due current hospitalization for pneumonia.  Will attempt intro call after hospital discharge.

## 2019-02-02 NOTE — Progress Notes (Signed)
Initial Nutrition Assessment  DOCUMENTATION CODES:   Not applicable  INTERVENTION:    Vital AF 1.2 at 75 ml/h (1800 ml per day)   Provides 2160 kcal, 135 gm protein, 1460 ml free water daily  NUTRITION DIAGNOSIS:   Inadequate oral intake related to inability to eat as evidenced by NPO status.  GOAL:   Patient will meet greater than or equal to 90% of their needs  MONITOR:   Vent status, TF tolerance, Labs, Skin, I & O's  REASON FOR ASSESSMENT:   Ventilator, Consult Enteral/tube feeding initiation and management  ASSESSMENT:   68 yo male with PMH of ICD, HF, V tach, ICM, melanoma on immunotherapy who was admitted with septic shock, RUL PNA, cardiac arrest in ED, requiring intubation.   Received MD Consult for TF initiation and management. OGT in place.  Patient is currently intubated on ventilator support MV: 13.8 L/min Temp (24hrs), Avg:98.3 F (36.8 C), Min:93.9 F (34.4 C), Max:99.8 F (37.7 C)   Labs reviewed. Phosphorus 5.8 (H), BUN 46 (H), creatinine 2.55 (H) CBG's: 237-245-230 Medications reviewed and include novolog, levophed, vasopressin.    NUTRITION - FOCUSED PHYSICAL EXAM:    Most Recent Value  Orbital Region  No depletion  Upper Arm Region  Unable to assess  Thoracic and Lumbar Region  Unable to assess  Buccal Region  No depletion  Temple Region  No depletion  Clavicle Bone Region  No depletion  Clavicle and Acromion Bone Region  No depletion  Scapular Bone Region  Unable to assess  Dorsal Hand  No depletion  Patellar Region  No depletion  Anterior Thigh Region  No depletion  Posterior Calf Region  No depletion  Edema (RD Assessment)  Mild  Hair  Reviewed  Eyes  Unable to assess  Mouth  Unable to assess  Skin  Reviewed  Nails  Reviewed       Diet Order:   Diet Order            Diet NPO time specified  Diet effective now              EDUCATION NEEDS:   No education needs have been identified at this time  Skin:  Skin  Assessment: Reviewed RN Assessment  Last BM:  none documented since admission  Height:   Ht Readings from Last 1 Encounters:  02/01/19 5\' 11"  (1.803 m)    Weight:   Wt Readings from Last 1 Encounters:  02/02/19 93 kg    Ideal Body Weight:  78.2 kg  BMI:  Body mass index is 28.6 kg/m.  Estimated Nutritional Needs:   Kcal:  2183  Protein:  135-150 gm  Fluid:  2.2 L    Molli Barrows, RD, LDN, [place] Pager [phone] After Hours Pager [phone]

## 2019-02-02 NOTE — Progress Notes (Signed)
NAME:  Lee Hall, MRN:  [id number], DOB:  05/06/1951, LOS: 1 ADMISSION DATE:  02/01/2019, CONSULTATION DATE:  3/5 REFERRING MD:  EDP, CHIEF COMPLAINT:  Shock, respiratory failure    Brief History   68 yo male presented with dyspnea, leg swelling, and hypotension.  Had brief cardiac arrest in ER.  Found to have septic shock from RUL PNA.  Had been on outpt ABx prior to admission.    Past Medical History  CAD, ischemic CM with systolic CHF EF 78%, VT s/p ICD/PM on amiodarone, Melanoma on immune therapy (nivolaub)  Significant Hospital Events   3/5 cardiac arrest  Consults:  Cardiology  Procedures:  ETT 3/05 >> Rt IJ CVL 3/05 >> Lt radial aline 3/05 >>   Significant Diagnostic Tests:  2D echo 3/5 >> EF 20 to 25%, mild/mod MR, mild/mod TR, mod AR  Micro Data:  Blood cultures X2 3/5 >> Urine 3/5 >> Sputum 3/5 >> Respiratory viral panel 3/05 >> negative  Antimicrobials:  Rocephin 3/5 x 1 Azithromycin 3/5>>> Cefepime 3/5>> Vancomycin 3/5 >>  Interim history/subjective:  Remains on pressors, full vent support.  Objective   Blood pressure (!) 89/57, pulse 100, temperature 98.3 F (36.8 C), temperature source Oral, resp. rate (!) 21, height 5\' 11"  (1.803 m), weight 93 kg, SpO2 96 %. CVP:  [12 mmHg-21 mmHg] 16 mmHg  Vent Mode: PRVC FiO2 (%):  [40 %-100 %] 40 % Set Rate:  [30 bmp] 30 bmp Vt Set:  [600 mL] 600 mL PEEP:  [8 cmH20] 8 cmH20 Pressure Support:  [10 cmH20] 10 cmH20 Plateau Pressure:  [22 cmH20-23 cmH20] 22 cmH20   Intake/Output Summary (Last 24 hours) at 02/02/2019 9381 Last data filed at 02/02/2019 0800 Gross per 24 hour  Intake 4914.3 ml  Output 775 ml  Net 4139.3 ml   Filed Weights   02/01/19 0837 02/02/19 0500  Weight: 83.2 kg 93 kg    Examination:  General - sedated Eyes - pupils reactive ENT - ETT in place Cardiac - regular rate/rhythm, no murmur Chest - b/l rhonchi Abdomen - soft, non tender, + bowel sounds Extremities - 1+  edema Skin - no rashes Neuro - RASS -2   CXR (reviewed by me) - Rt > Lt ASD  Resolved Hospital Problem list     Assessment & Plan:   Septic shock from pneumonia. Discussion: Was recently on ABx as outpt.  Has been on immunomodulatory therapy for melanoma. Plan - continue vancomycin, cefepime, zithromax - continue pressors to keep MAP > 65  Acute hypoxic respiratory failure with pulmonary infiltrates. Discussion: Likely from pneumonia.  Other possibility is from immunotherapy induced pneumonitis. Plan - full vent support - oxygen/PEEP to keep SpO2 90 to 95% - f/u CXR, ABG - add solumedrol 3/06 - prn BDs  Acute renal failure from ATN in setting of hypoxia, hypotension (baseline creatinine 1.07 from 01/19/19). Urine retention. Lactic acidosis. Plan - f/u BMET, ABG, lactic acid -  monitor urine outpt - continue HCO3 in IV fluid - insert foley - if renal fx continues to get worse, then will need nephrology consult  Elevated LFTs likely from Shock. Plan - f/u LFTs  Cardiac arrest in setting of respiratory failure and septic shock. Hx CAD, ischemic CM with chronic systolic CHF, VT, valvular heart disease. Plan - hold outpt amiodarone, entresto, coreg, mexitil - cardiology following  DM type II with hyperglycemia. Plan - SSI - Holding home metformin, Jardiance  Acute metabolic encephalopathy. Plan - RASS goal 0 to -  1  Hx of T3, N3 melanoma Rt shoulder dx January 2019. Discussion: Followed by Dr. Alen Blew.  Has been on Nivolumab since January 2020. Plan - hold nivolumab  Best practice:  Diet: tube feeds DVT prophylaxis: SQ Heparin GI prophylaxis: Protonix Mobility: Bedrest Code Status: Full Family Communication: updated family at bedside Disposition:  ICU  Labs    CMP Latest Ref Rng & Units 02/02/2019 02/01/2019 02/01/2019  Glucose 70 - 99 mg/dL 287(H) 277(H) -  BUN 8 - 23 mg/dL 46(H) 39(H) -  Creatinine 0.61 - 1.24 mg/dL 2.55(H) 2.23(H) -  Sodium 135 - 145  mmol/L 138 138 140  Potassium 3.5 - 5.1 mmol/L 4.3 4.6 4.6  Chloride 98 - 111 mmol/L 107 115(H) -  CO2 22 - 32 mmol/L 15(L) 13(L) -  Calcium 8.9 - 10.3 mg/dL 7.1(L) 6.2(LL) -  Total Protein 6.5 - 8.1 g/dL - 4.9(L) -  Total Bilirubin 0.3 - 1.2 mg/dL - 1.6(H) -  Alkaline Phos 38 - 126 U/L - 162(H) -  AST 15 - 41 U/L - 6,759(H) -  ALT 0 - 44 U/L - 3,747(H) -   CBC Latest Ref Rng & Units 02/02/2019 02/01/2019 02/01/2019  WBC 4.0 - 10.5 K/uL 14.9(H) - -  Hemoglobin 13.0 - 17.0 g/dL 14.0 13.9 13.3  Hematocrit 39.0 - 52.0 % 43.0 41.0 39.0  Platelets 150 - 400 K/uL 133(L) - -   ABG    Component Value Date/Time   PHART 7.452 (H) 02/02/2019 0333   PCO2ART 25.6 (L) 02/02/2019 0333   PO2ART 178 (H) 02/02/2019 0333   HCO3 17.4 (L) 02/02/2019 0333   TCO2 17 (L) 02/01/2019 1950   ACIDBASEDEF 5.7 (H) 02/02/2019 0333   O2SAT 98.6 02/02/2019 0333   CBG (last 3)  Recent Labs    02/02/19 0405 02/02/19 0738 02/02/19 0741  GLUCAP 236* 237* 245*   CC time 38 minutes  Chesley Mires, MD [facility] Pulmonary/Critical Care 02/02/2019, 10:03 AM

## 2019-02-03 ENCOUNTER — Inpatient Hospital Stay (HOSPITAL_COMMUNITY): Payer: Medicare HMO

## 2019-02-03 LAB — POCT I-STAT 7, (LYTES, BLD GAS, ICA,H+H)
Acid-Base Excess: 2 mmol/L (ref 0.0–2.0)
Bicarbonate: 25.6 mmol/L (ref 20.0–28.0)
CALCIUM ION: 1.03 mmol/L — AB (ref 1.15–1.40)
HCT: 38 % — ABNORMAL LOW (ref 39.0–52.0)
Hemoglobin: 12.9 g/dL — ABNORMAL LOW (ref 13.0–17.0)
O2 Saturation: 99 %
Patient temperature: 98.1
Potassium: 3.9 mmol/L (ref 3.5–5.1)
Sodium: 143 mmol/L (ref 135–145)
TCO2: 27 mmol/L (ref 22–32)
pCO2 arterial: 35.2 mmHg (ref 32.0–48.0)
pH, Arterial: 7.468 — ABNORMAL HIGH (ref 7.350–7.450)
pO2, Arterial: 107 mmHg (ref 83.0–108.0)

## 2019-02-03 LAB — MAGNESIUM
Magnesium: 2.3 mg/dL (ref 1.7–2.4)
Magnesium: 2.4 mg/dL (ref 1.7–2.4)

## 2019-02-03 LAB — COMPREHENSIVE METABOLIC PANEL
ALBUMIN: 2.2 g/dL — AB (ref 3.5–5.0)
ALT: 6738 U/L — ABNORMAL HIGH (ref 0–44)
AST: 8443 U/L — ABNORMAL HIGH (ref 15–41)
Alkaline Phosphatase: 179 U/L — ABNORMAL HIGH (ref 38–126)
Anion gap: 12 (ref 5–15)
BILIRUBIN TOTAL: 1.6 mg/dL — AB (ref 0.3–1.2)
BUN: 54 mg/dL — ABNORMAL HIGH (ref 8–23)
CO2: 23 mmol/L (ref 22–32)
Calcium: 7.4 mg/dL — ABNORMAL LOW (ref 8.9–10.3)
Chloride: 108 mmol/L (ref 98–111)
Creatinine, Ser: 2.36 mg/dL — ABNORMAL HIGH (ref 0.61–1.24)
GFR calc Af Amer: 32 mL/min — ABNORMAL LOW (ref >60)
GFR calc non Af Amer: 28 mL/min — ABNORMAL LOW (ref >60)
GLUCOSE: 228 mg/dL — AB (ref 70–99)
Potassium: 3.8 mmol/L (ref 3.5–5.1)
Sodium: 143 mmol/L (ref 135–145)
Total Protein: 4.9 g/dL — ABNORMAL LOW (ref 6.5–8.1)

## 2019-02-03 LAB — GLUCOSE, CAPILLARY
GLUCOSE-CAPILLARY: 214 mg/dL — AB (ref 70–99)
Glucose-Capillary: 131 mg/dL — ABNORMAL HIGH (ref 70–99)
Glucose-Capillary: 168 mg/dL — ABNORMAL HIGH (ref 70–99)
Glucose-Capillary: 187 mg/dL — ABNORMAL HIGH (ref 70–99)
Glucose-Capillary: 210 mg/dL — ABNORMAL HIGH (ref 70–99)
Glucose-Capillary: 222 mg/dL — ABNORMAL HIGH (ref 70–99)

## 2019-02-03 LAB — LACTIC ACID, PLASMA: Lactic Acid, Venous: 2.7 mmol/L (ref 0.5–1.9)

## 2019-02-03 LAB — PHOSPHORUS
Phosphorus: 3.9 mg/dL (ref 2.5–4.6)
Phosphorus: 4 mg/dL (ref 2.5–4.6)

## 2019-02-03 MED ORDER — SODIUM CHLORIDE 0.9 % IV SOLN
INTRAVENOUS | Status: DC
  Administered 2019-02-03 – 2019-02-05 (×4): via INTRAVENOUS

## 2019-02-03 NOTE — Progress Notes (Signed)
Attempted to wean on PSV again.  Pt had periodic episodes of apnea x 3 w/ vent going in to back-up apnea mode.  Pt placed back on full vent support.  RN at bedside and aware.

## 2019-02-03 NOTE — Progress Notes (Signed)
Vent changed back to full vent support w/ RR at 16- done by CCM MD d/t pt still sedated/sleepy.  RN in room and aware.

## 2019-02-03 NOTE — Progress Notes (Signed)
Art line catheter dislodged. Dr. Halford Chessman informed: No need to re-insert.

## 2019-02-03 NOTE — Progress Notes (Signed)
NAME:  Vincient Vanaman, MRN:  [id number], DOB:  Nov 29, 1951, LOS: 2 ADMISSION DATE:  02/01/2019, CONSULTATION DATE:  3/5 REFERRING MD:  EDP, CHIEF COMPLAINT:  Shock, respiratory failure    Brief History   68 yo male presented with dyspnea, leg swelling, and hypotension.  Had brief cardiac arrest in ER.  Found to have septic shock from RUL PNA.  Had been on outpt ABx prior to admission.    Past Medical History  CAD, ischemic CM with systolic CHF EF 67%, VT s/p ICD/PM on amiodarone, Melanoma on immune therapy (nivolaub)  Significant Hospital Events   3/5 cardiac arrest  Consults:  Cardiology  Procedures:  ETT 3/05 >> Rt IJ CVL 3/05 >> Lt radial aline 3/05 >>   Significant Diagnostic Tests:  2D echo 3/5 >> EF 20 to 25%, mild/mod MR, mild/mod TR, mod AR  Micro Data:  Blood cultures X2 3/5 >> Sputum 3/5 >> Respiratory viral panel 3/05 >> negative  Antimicrobials:  Rocephin 3/5 x 1 Azithromycin 3/5>>> 3/7 Cefepime 3/5>> Vancomycin 3/5 >>  Interim history/subjective:  Low RR with SBT.  Objective   Blood pressure (!) 101/54, pulse 60, temperature (!) 96.8 F (36 C), temperature source Axillary, resp. rate 11, height 5\' 11"  (1.803 m), weight 90.1 kg, SpO2 97 %. CVP:  [15 mmHg-19 mmHg] 17 mmHg  Vent Mode: PSV;CPAP FiO2 (%):  [40 %] 40 % Set Rate:  [24 bmp] 24 bmp Vt Set:  [600 mL] 600 mL PEEP:  [5 cmH20] 5 cmH20 Pressure Support:  [12 cmH20] 12 cmH20 Plateau Pressure:  [17 cmH20-19 cmH20] 19 cmH20   Intake/Output Summary (Last 24 hours) at 02/03/2019 2094 Last data filed at 02/03/2019 0800 Gross per 24 hour  Intake 3606.83 ml  Output 2475 ml  Net 1131.83 ml   Filed Weights   02/01/19 0837 02/02/19 0500 02/03/19 0500  Weight: 83.2 kg 93 kg 90.1 kg    Examination:  General - sedated Eyes - pupils reactive ENT - ETT in place Cardiac - regular rate/rhythm, no murmur Chest - scattered rhonchi Abdomen - soft, non tender, + bowel sounds Extremities - 1+  edema Skin - no rashes Neuro - RASS -1, follows simple commands  CXR (reviewed by me) - b/l ASD Rt > [facility] Problem list     Assessment & Plan:   Septic shock from pneumonia. Discussion: Was recently on ABx as outpt.  Has been on immunomodulatory therapy for melanoma. Plan - continue vancomycin, cefepime - will d/c zithromax - pressors to keep MAP > 65  Acute hypoxic respiratory failure with pulmonary infiltrates. Discussion: Likely from pneumonia.  Other possibility is from immunotherapy induced pneumonitis. Plan - adjust respiratory rate to 16 - continue vent support - goal SpO2 90 to 95% - continue solumedrol - prn BDs - f/u CXR  Acute renal failure from ATN in setting of hypoxia, hypotension (baseline creatinine 1.07 from 01/19/19). Urine retention. Lactic acidosis. Plan - f/u BMET - d/c HCO3 from IV fluid - keep foley in - defer nephrology consult for now  Elevated LFTs likely from Shock. Plan - f/u LFTs  Cardiac arrest in setting of respiratory failure and septic shock. Hx CAD, ischemic CM with chronic systolic CHF, VT, valvular heart disease. Plan - hold outpt amiodarone, entresto, coreg, mexitil - cardiology following  DM type II with hyperglycemia. Plan - SSI - added lantus 3/06 - Holding home metformin, Jardiance  Acute metabolic encephalopathy. Plan - RASS goal 0 to -1  Hx of  T3, N3 melanoma Rt shoulder dx January 2019. Discussion: Followed by Dr. Alen Blew.  Has been on Nivolumab since January 2020. Plan - hold nivolumab  Best practice:  Diet: tube feeds DVT prophylaxis: SQ Heparin GI prophylaxis: Protonix Mobility: Bedrest Code Status: Full Family Communication: no family at bedside Disposition:  ICU  Labs    CMP Latest Ref Rng & Units 02/03/2019 02/03/2019 02/02/2019  Glucose 70 - 99 mg/dL 228(H) - 287(H)  BUN 8 - 23 mg/dL 54(H) - 46(H)  Creatinine 0.61 - 1.24 mg/dL 2.36(H) - 2.55(H)  Sodium 135 - 145 mmol/L 143 143  138  Potassium 3.5 - 5.1 mmol/L 3.8 3.9 4.3  Chloride 98 - 111 mmol/L 108 - 107  CO2 22 - 32 mmol/L 23 - 15(L)  Calcium 8.9 - 10.3 mg/dL 7.4(L) - 7.1(L)  Total Protein 6.5 - 8.1 g/dL 4.9(L) - -  Total Bilirubin 0.3 - 1.2 mg/dL 1.6(H) - -  Alkaline Phos 38 - 126 U/L 179(H) - -  AST 15 - 41 U/L 8,443(H) - -  ALT 0 - 44 U/L 6,738(H) - -   CBC Latest Ref Rng & Units 02/03/2019 02/02/2019 02/02/2019  WBC 4.0 - 10.5 K/uL - 16.2(H) 14.9(H)  Hemoglobin 13.0 - 17.0 g/dL 12.9(L) 13.3 14.0  Hematocrit 39.0 - 52.0 % 38.0(L) 41.2 43.0  Platelets 150 - 400 K/uL - 126(L) 133(L)   ABG    Component Value Date/Time   PHART 7.468 (H) 02/03/2019 0409   PCO2ART 35.2 02/03/2019 0409   PO2ART 107.0 02/03/2019 0409   HCO3 25.6 02/03/2019 0409   TCO2 27 02/03/2019 0409   ACIDBASEDEF 5.7 (H) 02/02/2019 0333   O2SAT 99.0 02/03/2019 0409   CBG (last 3)  Recent Labs    02/02/19 2350 02/03/19 0347 02/03/19 0727  GLUCAP 227* 214* 222*   CC time 32 minutes  Chesley Mires, MD [facility] Pulmonary/Critical Care 02/03/2019, 8:23 AM

## 2019-02-04 ENCOUNTER — Inpatient Hospital Stay (HOSPITAL_COMMUNITY): Payer: Medicare HMO

## 2019-02-04 LAB — BASIC METABOLIC PANEL
ANION GAP: 9 (ref 5–15)
BUN: 60 mg/dL — ABNORMAL HIGH (ref 8–23)
CO2: 27 mmol/L (ref 22–32)
Calcium: 7.6 mg/dL — ABNORMAL LOW (ref 8.9–10.3)
Chloride: 110 mmol/L (ref 98–111)
Creatinine, Ser: 2.04 mg/dL — ABNORMAL HIGH (ref 0.61–1.24)
GFR calc non Af Amer: 33 mL/min — ABNORMAL LOW (ref >60)
GFR, EST AFRICAN AMERICAN: 38 mL/min — AB (ref >60)
Glucose, Bld: 255 mg/dL — ABNORMAL HIGH (ref 70–99)
Potassium: 4.1 mmol/L (ref 3.5–5.1)
Sodium: 146 mmol/L — ABNORMAL HIGH (ref 135–145)

## 2019-02-04 LAB — CBC
HCT: 38.6 % — ABNORMAL LOW (ref 39.0–52.0)
HEMOGLOBIN: 12.2 g/dL — AB (ref 13.0–17.0)
MCH: 29.5 pg (ref 26.0–34.0)
MCHC: 31.6 g/dL (ref 30.0–36.0)
MCV: 93.2 fL (ref 80.0–100.0)
Platelets: 83 10*3/uL — ABNORMAL LOW (ref 150–400)
RBC: 4.14 MIL/uL — AB (ref 4.22–5.81)
RDW: 14.4 % (ref 11.5–15.5)
WBC: 14.3 10*3/uL — ABNORMAL HIGH (ref 4.0–10.5)
nRBC: 1 % — ABNORMAL HIGH (ref 0.0–0.2)

## 2019-02-04 LAB — GLUCOSE, CAPILLARY
GLUCOSE-CAPILLARY: 221 mg/dL — AB (ref 70–99)
Glucose-Capillary: 104 mg/dL — ABNORMAL HIGH (ref 70–99)
Glucose-Capillary: 116 mg/dL — ABNORMAL HIGH (ref 70–99)
Glucose-Capillary: 146 mg/dL — ABNORMAL HIGH (ref 70–99)
Glucose-Capillary: 173 mg/dL — ABNORMAL HIGH (ref 70–99)
Glucose-Capillary: 217 mg/dL — ABNORMAL HIGH (ref 70–99)

## 2019-02-04 MED ORDER — ORAL CARE MOUTH RINSE
15.0000 mL | Freq: Two times a day (BID) | OROMUCOSAL | Status: DC
  Administered 2019-02-04 – 2019-02-06 (×5): 15 mL via OROMUCOSAL

## 2019-02-04 MED ORDER — BISACODYL 10 MG RE SUPP: 10.0000 mg | Freq: Every day | RECTAL | Status: DC | PRN

## 2019-02-04 MED ORDER — PANTOPRAZOLE SODIUM 40 MG IV SOLR
40.0000 mg | INTRAVENOUS | Status: DC
  Administered 2019-02-04 – 2019-02-09 (×6): 40 mg via INTRAVENOUS
  Filled 2019-02-04 (×6): qty 40

## 2019-02-04 MED ORDER — METHYLPREDNISOLONE SODIUM SUCC 125 MG IJ SOLR
60.0000 mg | Freq: Three times a day (TID) | INTRAMUSCULAR | Status: DC
  Administered 2019-02-04 – 2019-02-08 (×12): 60 mg via INTRAVENOUS
  Filled 2019-02-04 (×12): qty 2

## 2019-02-04 MED ORDER — DOCUSATE SODIUM 100 MG PO CAPS
100.0000 mg | ORAL_CAPSULE | Freq: Two times a day (BID) | ORAL | Status: DC
  Administered 2019-02-04 – 2019-02-09 (×5): 100 mg via ORAL
  Filled 2019-02-04 (×10): qty 1

## 2019-02-04 NOTE — Progress Notes (Signed)
NAME:  Lee Hall, MRN:  [id number], DOB:  06-04-1951, LOS: 3 ADMISSION DATE:  02/01/2019, CONSULTATION DATE:  3/5 REFERRING MD:  EDP, CHIEF COMPLAINT:  Shock, respiratory failure    Brief History   68 yo male presented with dyspnea, leg swelling, and hypotension.  Had brief cardiac arrest in ER.  Found to have septic shock from RUL PNA.  Had been on outpt ABx prior to admission.    Past Medical History  CAD, ischemic CM with systolic CHF EF 75%, VT s/p ICD/PM on amiodarone, Melanoma on immune therapy (nivolaub)  Significant Hospital Events   3/5 cardiac arrest  Consults:  Cardiology  Procedures:  ETT 3/05 >> Rt IJ CVL 3/05 >> Lt radial aline 3/05 >> 3/07  Significant Diagnostic Tests:  2D echo 3/5 >> EF 20 to 25%, mild/mod MR, mild/mod TR, mod AR  Micro Data:  Blood cultures X2 3/5 >> Sputum 3/5 >> Respiratory viral panel 3/05 >> negative  Antimicrobials:  Rocephin 3/5 x 1 Azithromycin 3/5>>> 3/7 Cefepime 3/5>> Vancomycin 3/5 >>  Interim history/subjective:  Remains on pressors.  Objective   Blood pressure 101/73, pulse 79, temperature 97.8 F (36.6 C), temperature source Axillary, resp. rate 20, height 5\' 11"  (1.803 m), weight 91 kg, SpO2 99 %. CVP:  [13 mmHg-22 mmHg] 20 mmHg  Vent Mode: PRVC FiO2 (%):  [40 %] 40 % Set Rate:  [16 bmp] 16 bmp Vt Set:  [600 mL] 600 mL PEEP:  [5 cmH20] 5 cmH20 Plateau Pressure:  [15 cmH20-20 cmH20] 20 cmH20   Intake/Output Summary (Last 24 hours) at 02/04/2019 0900 Last data filed at 02/04/2019 0800 Gross per 24 hour  Intake 2533.01 ml  Output 1675 ml  Net 858.01 ml   Filed Weights   02/02/19 0500 02/03/19 0500 02/04/19 0409  Weight: 93 kg 90.1 kg 91 kg    Examination:  General - sedated Eyes - pupils reactive ENT - ETT in place Cardiac - regular rate/rhythm, no murmur Chest - equal breath sounds b/l, no wheezing or rales Abdomen - soft, non tender, + bowel sounds Extremities - 1+ edema Skin - no  rashes Neuro - follows commands, moves extremities  CXR (reviewed by me) - b/l effusion and ASD  Resolved Hospital Problem list   Lactic acidosis  Assessment & Plan:   Septic shock from pneumonia. Discussion: Was recently on ABx as outpt.  Has been on immunomodulatory therapy for melanoma. Plan - day 4 vancomycin, cefepime - wean off pressors to keep MAP > 65  Acute hypoxic respiratory failure with pulmonary infiltrates. Discussion: Likely from pneumonia.  Other possibility is from immunotherapy induced pneumonitis. Plan - extubation trial 3/08 - oxygen to keep SpO2 > 92% - change solumedrol to 40 mg q8h - prn BDs - f/u CXR intermittently  Acute renal failure from ATN in setting of hypoxia, hypotension (baseline creatinine 1.07 from 01/19/19). Urine retention. Lactic acidosis. Plan - f/u BMET - keep foley in for now - defer nephrology consult for now  Elevated LFTs likely from Shock. Plan - f/u LFTs  Cardiac arrest in setting of respiratory failure and septic shock. Hx CAD, ischemic CM with chronic systolic CHF, VT, valvular heart disease. Plan - hold outpt amiodarone, entresto, coreg, mexitil - cardiology following  DM type II with hyperglycemia. Plan - SSI - hold lantus now that he is off tube feeds after extubation - Holding home metformin, Jardiance  Acute metabolic encephalopathy. Plan - monitor mental status after extubation  Hx of T3, N3  melanoma Rt shoulder dx January 2019. Discussion: Followed by Dr. Alen Blew.  Has been on Nivolumab since January 2020. Plan - hold nivolumab  Best practice:  Diet: NPO DVT prophylaxis: SQ Heparin GI prophylaxis: Protonix Mobility: Up to chair Code Status: Full Family Communication: no family at bedside Disposition:  ICU  Labs    CMP Latest Ref Rng & Units 02/04/2019 02/03/2019 02/03/2019  Glucose 70 - 99 mg/dL 255(H) 228(H) -  BUN 8 - 23 mg/dL 60(H) 54(H) -  Creatinine 0.61 - 1.24 mg/dL 2.04(H) 2.36(H) -   Sodium 135 - 145 mmol/L 146(H) 143 143  Potassium 3.5 - 5.1 mmol/L 4.1 3.8 3.9  Chloride 98 - 111 mmol/L 110 108 -  CO2 22 - 32 mmol/L 27 23 -  Calcium 8.9 - 10.3 mg/dL 7.6(L) 7.4(L) -  Total Protein 6.5 - 8.1 g/dL - 4.9(L) -  Total Bilirubin 0.3 - 1.2 mg/dL - 1.6(H) -  Alkaline Phos 38 - 126 U/L - 179(H) -  AST 15 - 41 U/L - 8,443(H) -  ALT 0 - 44 U/L - 6,738(H) -   CBC Latest Ref Rng & Units 02/04/2019 02/03/2019 02/02/2019  WBC 4.0 - 10.5 K/uL 14.3(H) - 16.2(H)  Hemoglobin 13.0 - 17.0 g/dL 12.2(L) 12.9(L) 13.3  Hematocrit 39.0 - 52.0 % 38.6(L) 38.0(L) 41.2  Platelets 150 - 400 K/uL 83(L) - 126(L)   ABG    Component Value Date/Time   PHART 7.468 (H) 02/03/2019 0409   PCO2ART 35.2 02/03/2019 0409   PO2ART 107.0 02/03/2019 0409   HCO3 25.6 02/03/2019 0409   TCO2 27 02/03/2019 0409   ACIDBASEDEF 5.7 (H) 02/02/2019 0333   O2SAT 99.0 02/03/2019 0409   CBG (last 3)  Recent Labs    02/03/19 2350 02/04/19 0347 02/04/19 0722  GLUCAP 210* 217* 221*   CC time 34 minutes  Chesley Mires, MD [place] 02/04/2019, 9:00 AM

## 2019-02-04 NOTE — Procedures (Signed)
Extubation Procedure Note  Patient Details:   Name: Lee Hall DOB: [birth year] MRN: [id number]   Airway Documentation:    Vent end date: 02/04/19 Vent end time: 0909   Evaluation  O2 sats: stable throughout Complications: No apparent complications Patient did tolerate procedure well. Bilateral Breath Sounds: Clear, Diminished   Yes   Patient extubated to a 4L [place]. Cuff leak was heard. No stridor was noted. RN at bedside with RT during extubation. Patient appears to be tolerating well at this time.  Tiburcio Bash 02/04/2019, 9:11 AM

## 2019-02-05 ENCOUNTER — Inpatient Hospital Stay (HOSPITAL_COMMUNITY): Payer: Medicare HMO

## 2019-02-05 ENCOUNTER — Ambulatory Visit: Payer: Medicare HMO | Admitting: Family Medicine

## 2019-02-05 DIAGNOSIS — R6520 Severe sepsis without septic shock: Secondary | ICD-10-CM

## 2019-02-05 DIAGNOSIS — G9340 Encephalopathy, unspecified: Secondary | ICD-10-CM

## 2019-02-05 DIAGNOSIS — J9601 Acute respiratory failure with hypoxia: Secondary | ICD-10-CM

## 2019-02-05 DIAGNOSIS — I469 Cardiac arrest, cause unspecified: Secondary | ICD-10-CM

## 2019-02-05 DIAGNOSIS — A419 Sepsis, unspecified organism: Secondary | ICD-10-CM

## 2019-02-05 DIAGNOSIS — J189 Pneumonia, unspecified organism: Secondary | ICD-10-CM

## 2019-02-05 LAB — GLUCOSE, CAPILLARY
Glucose-Capillary: 159 mg/dL — ABNORMAL HIGH (ref 70–99)
Glucose-Capillary: 111 mg/dL — ABNORMAL HIGH (ref 70–99)
Glucose-Capillary: 111 mg/dL — ABNORMAL HIGH (ref 70–99)
Glucose-Capillary: 129 mg/dL — ABNORMAL HIGH (ref 70–99)
Glucose-Capillary: 144 mg/dL — ABNORMAL HIGH (ref 70–99)
Glucose-Capillary: 122 mg/dL — ABNORMAL HIGH (ref 70–99)

## 2019-02-05 LAB — CBC
HCT: 42.1 % (ref 39.0–52.0)
Hemoglobin: 13.1 g/dL (ref 13.0–17.0)
MCH: 29.5 pg (ref 26.0–34.0)
MCHC: 31.1 g/dL (ref 30.0–36.0)
MCV: 94.8 fL (ref 80.0–100.0)
Platelets: 50 10*3/uL — ABNORMAL LOW (ref 150–400)
RBC: 4.44 MIL/uL (ref 4.22–5.81)
RDW: 14.5 % (ref 11.5–15.5)
WBC: 12.9 10*3/uL — ABNORMAL HIGH (ref 4.0–10.5)
nRBC: 0.5 % — ABNORMAL HIGH (ref 0.0–0.2)

## 2019-02-05 LAB — COMPREHENSIVE METABOLIC PANEL
ALT: 2457 U/L — ABNORMAL HIGH (ref 0–44)
AST: 616 U/L — ABNORMAL HIGH (ref 15–41)
Albumin: 2.5 g/dL — ABNORMAL LOW (ref 3.5–5.0)
Alkaline Phosphatase: 157 U/L — ABNORMAL HIGH (ref 38–126)
Anion gap: 6 (ref 5–15)
BUN: 61 mg/dL — ABNORMAL HIGH (ref 8–23)
CO2: 26 mmol/L (ref 22–32)
Calcium: 8.1 mg/dL — ABNORMAL LOW (ref 8.9–10.3)
Chloride: 111 mmol/L (ref 98–111)
Creatinine, Ser: 1.84 mg/dL — ABNORMAL HIGH (ref 0.61–1.24)
GFR calc Af Amer: 44 mL/min — ABNORMAL LOW (ref >60)
GFR calc non Af Amer: 38 mL/min — ABNORMAL LOW (ref >60)
Glucose, Bld: 152 mg/dL — ABNORMAL HIGH (ref 70–99)
Potassium: 4.1 mmol/L (ref 3.5–5.1)
Sodium: 143 mmol/L (ref 135–145)
Total Bilirubin: 1.4 mg/dL — ABNORMAL HIGH (ref 0.3–1.2)
Total Protein: 5 g/dL — ABNORMAL LOW (ref 6.5–8.1)

## 2019-02-05 LAB — CULTURE, RESPIRATORY

## 2019-02-05 LAB — CULTURE, RESPIRATORY W GRAM STAIN: Culture: NORMAL

## 2019-02-05 MED ORDER — VANCOMYCIN HCL 10 G IV SOLR
1250.0000 mg | INTRAVENOUS | Status: AC
  Administered 2019-02-05 – 2019-02-07 (×3): 1250 mg via INTRAVENOUS
  Filled 2019-02-05 (×3): qty 1250

## 2019-02-05 MED ORDER — FUROSEMIDE 10 MG/ML IJ SOLN
40.0000 mg | Freq: Every day | INTRAMUSCULAR | Status: DC
  Administered 2019-02-05: 40 mg via INTRAVENOUS
  Filled 2019-02-05 (×2): qty 4

## 2019-02-05 MED ORDER — FENTANYL CITRATE (PF) 100 MCG/2ML IJ SOLN
INTRAMUSCULAR | Status: AC
  Administered 2019-02-06: 100 ug via INTRAVENOUS
  Filled 2019-02-05: qty 2

## 2019-02-05 MED ORDER — FUROSEMIDE 10 MG/ML IJ SOLN
80.0000 mg | Freq: Once | INTRAMUSCULAR | Status: AC
  Administered 2019-02-05: 80 mg via INTRAVENOUS
  Filled 2019-02-05: qty 8

## 2019-02-05 MED ORDER — ASPIRIN EC 81 MG PO TBEC
81.0000 mg | DELAYED_RELEASE_TABLET | Freq: Every day | ORAL | Status: DC
  Administered 2019-02-05 – 2019-02-14 (×10): 81 mg via ORAL
  Filled 2019-02-05 (×10): qty 1

## 2019-02-05 MED ORDER — FUROSEMIDE 10 MG/ML IJ SOLN
40.0000 mg | Freq: Once | INTRAMUSCULAR | Status: AC
  Administered 2019-02-05: 40 mg via INTRAVENOUS

## 2019-02-05 NOTE — Progress Notes (Signed)
Rehab Admissions Coordinator Note:  Patient was screened by Cleatrice Burke for appropriateness for an Inpatient Acute Rehab Consult per OT recommendations. Noted eval limited by fatigue. Await further progress with therapy before requesting an inpt rehab consult. I will follow.  Cleatrice Burke RN MSN 02/05/2019, 4:48 PM  I can be reached at [phone].

## 2019-02-05 NOTE — Progress Notes (Signed)
Pharmacy Antibiotic Note  Lee Hall is a 68 y.o. male admitted on 02/01/2019 with pneumonia.  Pharmacy has been consulted for vancomycin and cefepime dosing. On D#5 of abx. SCr has improved 1.84. WBC down to 12.9.   Plan: -Increase vancomycin to 1250 mg IV Q 24 hours. -Continue Cefepime 2 gm IV Q 24 hours -Complete 7 day abx course per CCM -Will hold off on vancomycin levels unless SCr bumps   Height: 5\' 11"  (180.3 cm) Weight: 200 lb 9.9 oz (91 kg) IBW/kg (Calculated) : 75.3  Temp (24hrs), Avg:97.3 F (36.3 C), Min:96.3 F (35.7 C), Max:97.6 F (36.4 C)  Recent Labs  Lab 02/01/19 0604 02/01/19 0612 02/01/19 0811 02/01/19 1057  02/01/19 1206 02/01/19 2138 02/02/19 0528 02/02/19 1005 02/03/19 0453 02/04/19 0452 02/05/19 0432 02/05/19 0557  WBC 15.8*  --   --   --   --   --   --  14.9* 16.2*  --  14.3*  --  12.9*  CREATININE 1.47*  --   --   --    < >  --  2.23* 2.55*  --  2.36* 2.04* 1.84*  --   LATICACIDVEN  --  4.8* 7.7* 9.3*  --  8.3*  --   --   --  2.7*  --   --   --    < > = values in this interval not displayed.    Estimated Creatinine Clearance: 46.2 mL/min (A) (by C-G formula based on SCr of 1.84 mg/dL (H)).    No Known Allergies  Vanc 3/5>> Cefepime 3/5>> CTX x 1 3/5 Azithro 3/5>>3/7  RVP 3/5>>neg Trach aspirate 3/5>>rare GPC rare GNR MRSA neg BCX 3/5>>  Thank you for allowing pharmacy to be a part of this patient's care.  Albertina Parr, PharmD., BCPS Clinical Pharmacist Clinical phone for 02/05/19 until 3:30pm: [phone] If after 3:30pm, please refer to [facility] for unit-specific pharmacist

## 2019-02-05 NOTE — Progress Notes (Signed)
PT Cancellation Note  Patient Details Name: Lee Hall MRN: [id number] DOB: [birth year]   Cancelled Treatment:    Reason Eval/Treat Not Completed: Fatigue/lethargy limiting ability to participate.  Pt just transferred back to bed and per OT who is in the room with him is very fatigued.  PT to check back tomorrow.  Thanks,  Barbarann Ehlers. Keaten Mashek, PT, DPT  Acute Rehabilitation [phone] pager [phone]) [phone] office     Barbarann Ehlers Lindley Hiney 02/05/2019, 4:24 PM

## 2019-02-05 NOTE — Progress Notes (Signed)
[facility] Progress Note Patient Name: Lee Hall DOB: [birth year] MRN: [id number]   Date of Service  02/05/2019  HPI/Events of Note  Sats have decreased to 87% and respirations labored.   eICU Interventions  Will 0rder: 1. BiPAP. 2. Lasix 80 mg IV now. 3. Ground team notified to evaluate at bedside.      Intervention Category Major Interventions: Hypoxemia - evaluation and management  [name] 02/05/2019, 9:42 PM

## 2019-02-05 NOTE — Progress Notes (Signed)
NAME:  Lee Hall, MRN:  [id number], DOB:  Dec 14, 1950, LOS: 4 ADMISSION DATE:  02/01/2019, CONSULTATION DATE:  3/5 REFERRING MD:  EDP, CHIEF COMPLAINT:  Shock, respiratory failure    Brief History   68 yo male presented with dyspnea, leg swelling, and hypotension.  Had brief cardiac arrest in ER.  Found to have septic shock from RUL PNA.  Had been on outpt ABx prior to admission.    Past Medical History  CAD, ischemic CM with systolic CHF EF 57%, VT s/p ICD/PM on amiodarone, Melanoma on immune therapy (nivolaub)  Significant Hospital Events   3/5 cardiac arrest  Consults:  Cardiology  Procedures:  ETT 3/05 >>3/8 Rt IJ CVL 3/05 >> Lt radial aline 3/05 >> 3/07  Significant Diagnostic Tests:  2D echo 3/5 >> EF 20 to 25%, mild/mod MR, mild/mod TR, mod AR  Micro Data:  Blood cultures X2 3/5 >> Sputum 3/5 >> Respiratory viral panel 3/05 >> negative  Antimicrobials:  Rocephin 3/5 x 1 Azithromycin 3/5>>> 3/7 Cefepime 3/5>> Vancomycin 3/5 >>  Interim history/subjective:  Extubated and well tolerated No events overnight, no new complaints  Objective   Blood pressure 96/66, pulse 61, temperature 97.6 F (36.4 C), temperature source Oral, resp. rate 20, height 5\' 11"  (1.803 m), weight 91 kg, SpO2 94 %. CVP:  [11 mmHg-14 mmHg] 13 mmHg  Vent Mode: PSV;CPAP FiO2 (%):  [40 %] 40 % PEEP:  [5 cmH20] 5 cmH20 Pressure Support:  [5 cmH20] 5 cmH20   Intake/Output Summary (Last 24 hours) at 02/05/2019 0820 Last data filed at 02/05/2019 0800 Gross per 24 hour  Intake 3620.51 ml  Output 1050 ml  Net 2570.51 ml   Filed Weights   02/02/19 0500 02/03/19 0500 02/04/19 0409  Weight: 93 kg 90.1 kg 91 kg    Examination:  General - Well appearing, NAD Eyes - PERRL, OEM-I ENT - -LAN and supple Cardiac - RRR, Nl S1/S2 and -M/R/G Chest - Bibasilar crackles Abdomen - Soft, NT, ND and +BS Extremities - -edema and -tenderness Skin - no rashes Neuro - follows commands, moves  extremities  I reviewed CXR myself, infiltrate noted  Resolved Hospital Problem list   Lactic acidosis  Assessment & Plan:   Septic shock from pneumonia. Discussion: Was recently on ABx as outpt.  Has been on immunomodulatory therapy for melanoma. Plan - Day 5/7 vancomycin, cefepime, stop date in place - D/C pressors  Acute hypoxic respiratory failure with pulmonary infiltrates. Discussion: Likely from pneumonia.  Other possibility is from immunotherapy induced pneumonitis. Plan - Titrate O2 for sat of 88-92% - IS - Flutter valve - Changed solumedrol to 40 mg q8h, will continue for now - PRN BDs - F/u CXR to PRN at this point  Acute renal failure from ATN in setting of hypoxia, hypotension (baseline creatinine 1.07 from 01/19/19). Urine retention. Lactic acidosis. Plan - F/u BMET - Keep foley in for now - Defer nephrology consult for now - [facility] IVF  Elevated LFTs likely from Shock. Plan - F/u LFTs in AM  Cardiac arrest in setting of respiratory failure and septic shock. Hx CAD, ischemic CM with chronic systolic CHF, VT, valvular heart disease. Plan - Hold outpt amiodarone, entresto, coreg, mexitil - Cardiology following  DM type II with hyperglycemia. Plan - SSI - Hold lantus now that he is off tube feeds after extubation - Holding home metformin, Jardiance  Acute metabolic encephalopathy. Plan - Minimize sedation - Reorient - Transfer out  Hx of T3, N3  melanoma Rt shoulder dx January 2019. Discussion: Followed by Dr. Alen Blew.  Has been on Nivolumab since January 2020. Plan - Hold nivolumab, will start after out of the ICU  Discussed with PCCM-NP  Transfer to progressive care, transfer to [facility] with PCCM off 3/10  Best practice:  Diet: NPO DVT prophylaxis: SQ Heparin GI prophylaxis: Protonix Mobility: Up to chair Code Status: Full Family Communication: no family at bedside Disposition:  ICU  Labs    CMP Latest Ref Rng & Units 02/05/2019 02/04/2019  02/03/2019  Glucose 70 - 99 mg/dL 152(H) 255(H) 228(H)  BUN 8 - 23 mg/dL 61(H) 60(H) 54(H)  Creatinine 0.61 - 1.24 mg/dL 1.84(H) 2.04(H) 2.36(H)  Sodium 135 - 145 mmol/L 143 146(H) 143  Potassium 3.5 - 5.1 mmol/L 4.1 4.1 3.8  Chloride 98 - 111 mmol/L 111 110 108  CO2 22 - 32 mmol/L 26 27 23   Calcium 8.9 - 10.3 mg/dL 8.1(L) 7.6(L) 7.4(L)  Total Protein 6.5 - 8.1 g/dL 5.0(L) - 4.9(L)  Total Bilirubin 0.3 - 1.2 mg/dL 1.4(H) - 1.6(H)  Alkaline Phos 38 - 126 U/L 157(H) - 179(H)  AST 15 - 41 U/L 616(H) - 8,443(H)  ALT 0 - 44 U/L 2,457(H) - 6,738(H)   CBC Latest Ref Rng & Units 02/05/2019 02/04/2019 02/03/2019  WBC 4.0 - 10.5 K/uL 12.9(H) 14.3(H) -  Hemoglobin 13.0 - 17.0 g/dL 13.1 12.2(L) 12.9(L)  Hematocrit 39.0 - 52.0 % 42.1 38.6(L) 38.0(L)  Platelets 150 - 400 K/uL 50(L) 83(L) -   ABG    Component Value Date/Time   PHART 7.468 (H) 02/03/2019 0409   PCO2ART 35.2 02/03/2019 0409   PO2ART 107.0 02/03/2019 0409   HCO3 25.6 02/03/2019 0409   TCO2 27 02/03/2019 0409   ACIDBASEDEF 5.7 (H) 02/02/2019 0333   O2SAT 99.0 02/03/2019 0409   CBG (last 3)  Recent Labs    02/04/19 2352 02/05/19 0331 02/05/19 0715  GLUCAP 173* 159* 111*   Rush Farmer, M.D. [facility] Pulmonary/Critical Care Medicine. Pager: [phone]. After hours pager: [phone].

## 2019-02-05 NOTE — Progress Notes (Signed)
eLink Physician-Brief Progress Note Patient Name: Lee Hall DOB: June 04, 1951 MRN: [id number]   Date of Service  02/05/2019  HPI/Events of Note  Pulled off BiPAP and dropped sat into mid 80's. Breathing labored at RR = 32-36.  eICU Interventions  Will ask ground team to re-evaluate. Intubation?     Intervention Category Major Interventions: Hypoxemia - evaluation and management  Jeralynn Vaquera [place] 02/05/2019, 11:06 PM

## 2019-02-05 NOTE — Progress Notes (Signed)
Progress Note  Patient Name: Lee Hall Date of Encounter: 02/05/2019  Primary Cardiologist:   Will Meredith Leeds, MD   Subjective   Extubated and doing better. Sitting up in the chair today - he seemed somewhat slow of thought and I had to repeatedly tell him things we just covered, suggesting some impairment of attention or short-term memory. He is recorded 10L positive (+2L overnight) - off of all CHF medications given septic shock. No complaints of chest pain.   Inpatient Medications    Scheduled Meds: . docusate sodium  100 mg Oral BID  . heparin  5,000 Units Subcutaneous Q8H  . insulin aspart  3-9 Units Subcutaneous Q4H  . mouth rinse  15 mL Mouth Rinse BID  . methylPREDNISolone (SOLU-MEDROL) injection  60 mg Intravenous Q8H  . pantoprazole (PROTONIX) IV  40 mg Intravenous Q24H   Continuous Infusions: . ceFEPime (MAXIPIME) IV Stopped (02/04/19 1159)  . vancomycin     PRN Meds: bisacodyl, ipratropium-albuterol   Vital Signs    Vitals:   02/05/19 0600 02/05/19 0609 02/05/19 0716 02/05/19 0800  BP:  103/78  96/66  Pulse: 65 65  61  Resp: 17 19  20   Temp:   97.6 F (36.4 C)   TempSrc:   Oral   SpO2: 95% 98%  94%  Weight:      Height:        Intake/Output Summary (Last 24 hours) at 02/05/2019 0915 Last data filed at 02/05/2019 0800 Gross per 24 hour  Intake 3536.8 ml  Output 1050 ml  Net 2486.8 ml   Filed Weights   02/02/19 0500 02/03/19 0500 02/04/19 0409  Weight: 93 kg 90.1 kg 91 kg    Telemetry    Paced rhythm- Personally Reviewed  ECG    NA - Personally Reviewed  Physical Exam   General appearance: alert, no distress, slowed mentation and sitting up in the chair Neck: JVD - 3 cm above sternal notch, no carotid bruit and thyroid not enlarged, symmetric, no tenderness/mass/nodules Lungs: diminished breath sounds bibasilar Heart: regular rate and rhythm Abdomen: soft, non-tender; bowel sounds normal; no masses,  no  organomegaly Extremities: edema 1+ edema Pulses: 2+ and symmetric Skin: pale, warm, dry Neurologic: Mental status: Awake, converses, impaired attention or possible short-term memory defecit, MAEW Psych: Pleasant   Labs    Chemistry Recent Labs  Lab 02/01/19 2138  02/03/19 0453 02/04/19 0452 02/05/19 0432  NA 138   < > 143 146* 143  K 4.6   < > 3.8 4.1 4.1  CL 115*   < > 108 110 111  CO2 13*   < > 23 27 26   GLUCOSE 277*   < > 228* 255* 152*  BUN 39*   < > 54* 60* 61*  CREATININE 2.23*   < > 2.36* 2.04* 1.84*  CALCIUM 6.2*   < > 7.4* 7.6* 8.1*  PROT 4.9*  --  4.9*  --  5.0*  ALBUMIN 2.3*  --  2.2*  --  2.5*  AST 6,759*  --  8,443*  --  616*  ALT 3,747*  --  6,738*  --  2,457*  ALKPHOS 162*  --  179*  --  157*  BILITOT 1.6*  --  1.6*  --  1.4*  GFRNONAA 30*   < > 28* 33* 38*  GFRAA 35*   < > 32* 38* 44*  ANIONGAP 10   < > 12 9 6    < > = values in  this interval not displayed.     Hematology Recent Labs  Lab 02/02/19 1005 02/03/19 0409 02/04/19 0452 02/05/19 0557  WBC 16.2*  --  14.3* 12.9*  RBC 4.60  --  4.14* 4.44  HGB 13.3 12.9* 12.2* 13.1  HCT 41.2 38.0* 38.6* 42.1  MCV 89.6  --  93.2 94.8  MCH 28.9  --  29.5 29.5  MCHC 32.3  --  31.6 31.1  RDW 14.4  --  14.4 14.5  PLT 126*  --  83* 50*    Cardiac Enzymes Recent Labs  Lab 02/01/19 1058 02/01/19 1424 02/01/19 2138  TROPONINI 0.06* 0.19* 0.16*   No results for input(s): TROPIPOC in the last 168 hours.   BNP Recent Labs  Lab 01/31/19 1628  PROBNP 1,698.0*     DDimer  Recent Labs  Lab 01/31/19 1628  DDIMER 0.49     Radiology    Dg Chest Port 1 View  Result Date: 02/05/2019 CLINICAL DATA:  Respiratory failure EXAM: PORTABLE CHEST 1 VIEW COMPARISON:  02/04/2019 FINDINGS: Chronic cardiomegaly. Dual-chamber ICD/pacer leads from the left. Right IJ line with tip at the SVC. Interval extubation. Asymmetric hazy opacity in the right lung which could be infection or asymmetric edema. There is  generalized interstitial coarsening and pleural fluid. No pneumothorax. IMPRESSION: Probable inferior flow pleural fluid. Otherwise stable chest with asymmetric interstitial and airspace disease. Electronically Signed   By: Monte Fantasia M.D.   On: 02/05/2019 07:25   Dg Chest Port 1 View  Result Date: 02/04/2019 CLINICAL DATA:  Respiratory failure EXAM: PORTABLE CHEST 1 VIEW COMPARISON:  02/03/2019 FINDINGS: Cardiac shadow is enlarged but stable. Defibrillator is again noted. Right jugular central line, endotracheal tube and gastric catheter are noted. Persistent right mid lung opacification is noted. Diffuse increased density is noted on the right consistent with posteriorly layering effusion. Left retrocardiac density is noted and stable. No new focal abnormality is seen. IMPRESSION: Overall stable appearance of the chest with bilateral opacities and posteriorly layering right effusion. Electronically Signed   By: Inez Catalina M.D.   On: 02/04/2019 07:21    Cardiac Studies   Echo 02/01/19  1. The left ventricle has severely reduced systolic function, with an ejection fraction of 20-25%. The cavity size was moderately dilated. Left ventricular diastology could not be evaluated due to nondiagnostic images. Elevated left atrial and left  ventricular end-diastolic pressures.  2. There is akinesis of the mid to apical inferior, anterior, lateral, inferolateral, apical and apical septal walls. There is akinesis of the mid anteroseptal and inferoseptal wall.  3. The right ventricle has normal systolic function. The cavity was normal. There is no increase in right ventricular wall thickness. Right ventricular systolic pressure is moderately elevated with an estimated pressure of 55.4 mmHg.  4. Left atrial size was severely dilated.  5. The mitral valve is normal in structure. There is mild mitral annular calcification present. Mitral valve regurgitation is moderate to severe by color flow Doppler.  6. The  tricuspid valve is normal in structure. Tricuspid valve regurgitation is mild-moderate.  7. The aortic valve is tricuspid Moderate thickening of the aortic valve Moderate calcification of the aortic valve. Aortic valve regurgitation is moderate by color flow Doppler.  8. The pulmonic valve was normal in structure.  9. The inferior vena cava was dilated in size with <50% respiratory variability  Patient Profile     68 y.o. male with a hx of ICM s/p ICD/PPM, VT CAD with occluded LAD/collaterals, melanoma, who  is being seen for the evaluation of cardiac arrest and + troponin at the request of Dr. Nelda Marseille.  Assessment & Plan    SEPTIC SHOCK/RESPIRATORY FAILURE:  Now off pressors, extubated. BP remains in the 51'I systolic. Markedly elevated AST/ALT - improving. Leukocytosis improved to 12.9K today, creatinine improving.    ACUTE ON CHRONIC CHF:   LVEF 20-25% - he is +10L with shock resuscitation. Will need gentle diuresis. Does report being dyspneic, especially lying down and is more comfortable in the chair. JVP mildly elevated. BP will still not support restarting Entresto or carvedilol. Holding amiodarone 200 mg daily given elevated AST/ALT. Start gentle diuresis today.  ELEVATED TROPONIN:  Known CAD.  I suspect that this is demand ischemia.  Will not pursue invasive work-up given known underlying disease including occluded LAD with collaterals. Restart low dose aspirin.  ICD/PPM : No active issues - history of VT, but none noted currently.   For questions or updates, please contact [facility] Please consult [email or web address] for contact info under Cardiology/STEMI.   Pixie Casino, MD, [facility] [place] Director of the Advanced Lipid Disorders &  Cardiovascular Risk Reduction Clinic Diplomate of the American Board of Clinical Lipidology Attending Cardiologist  Direct Dial: [phone]  Fax: [phone]  Website:  [email or web address]  Pixie Casino, MD  02/05/2019, 9:15 AM

## 2019-02-05 NOTE — Progress Notes (Signed)
Occupational Therapy Evaluation Patient Details Name: Lee Hall MRN: [id number] DOB: September 28, 1951 Today's Date: 02/05/2019    History of Present Illness 68 yo male presented with dyspnea, leg swelling, and hypotension.  Had brief cardiac arrest in ER.  Found to have septic shock from RUL PNA.  Had been on outpt ABx prior to admission.     Clinical Impression   PTA, pt lived alone and was independent with ADL, IADL and drove. Pt has a sister nearby who can assist at DC. Eval limited due to fatigue - pt had sat up @ 6hrs and just returned to bed with nsg. Nsg reports min A +2 with bed - chair transfers, mod A with LB ADL. VSS during session on 2L. Pt agreeable to progress mobility tomorrow. At this time recommend rehab at [facility]. Pt is in agreement for need for post acute rehab.     Follow Up Recommendations  Supervision/Assistance - 24 hour;CIR    Equipment Recommendations  3 in 1 bedside commode    Recommendations for Other Services PT consult     Precautions / Restrictions Precautions Precautions: Fall;ICD/Pacemaker(watch O2 sats)      Mobility Bed Mobility Overal bed mobility: Needs Assistance Bed Mobility: Rolling Rolling: Supervision         General bed mobility comments: Pt declined OOB due to just returning to bed  Transfers                 General transfer comment: Per nsg +2 min A to chair    Balance                                           ADL either performed or assessed with clinical judgement   ADL Overall ADL's : Needs assistance/impaired     Grooming: Set up;Sitting   Upper Body Bathing: Minimal assistance;Sitting   Lower Body Bathing: Moderate assistance;Sit to/from stand   Upper Body Dressing : Minimal assistance;Sitting   Lower Body Dressing: Moderate assistance;Sit to/from stand               Functional mobility during ADLs: Minimal assistance;+2 for physical assistance(per nsg)       Vision  Baseline Vision/History: Wears glasses Wears Glasses: At all times       Perception     Praxis      Pertinent Vitals/Pain Pain Assessment: 0-10 Pain Score: 10-Worst pain ever Pain Location: struggle to breath Pain Descriptors / Indicators: Discomfort Pain Intervention(s): Limited activity within patient's tolerance     Hand Dominance Right   Extremity/Trunk Assessment Upper Extremity Assessment Upper Extremity Assessment: Generalized weakness   Lower Extremity Assessment Lower Extremity Assessment: Generalized weakness   Cervical / Trunk Assessment Cervical / Trunk Assessment: Normal   Communication Communication Communication: No difficulties   Cognition Arousal/Alertness: Awake/alert Behavior During Therapy: Flat affect Overall Cognitive Status: No family/caregiver present to determine baseline cognitive functioning    Not oriented to month (February); slow processing; will further assess                             General Comments: slow processing; will further assess   General Comments  R forearm edematous    Exercises Exercises: Other exercises Other Exercises Other Exercises: BUE AROM through full AROM; FF x 10; ABd x 10; gross grasp/release Encouraged exercises while in  bed. Pt verbalized understanding.   Shoulder Instructions      Home Living Family/patient expects to be discharged to:: Private residence Living Arrangements: Alone Available Help at Discharge: Family;Available PRN/intermittently Type of Home: Apartment Home Access: Stairs to enter Entrance Stairs-Number of Steps: 14 Entrance Stairs-Rails: Right;Left;Can reach both Home Layout: One level     Bathroom Shower/Tub: Corporate investment banker: Standard Bathroom Accessibility: Yes How Accessible: Accessible via walker Home Equipment: None          Prior Functioning/Environment Level of Independence: Independent        Comments: drives; independent  with financial and medicaiton managment        OT Problem List: Decreased strength;Decreased activity tolerance;Impaired balance (sitting and/or standing);Decreased safety awareness;Decreased knowledge of use of DME or AE;Cardiopulmonary status limiting activity;Pain      OT Treatment/Interventions: Self-care/ADL training;Therapeutic exercise;Energy conservation;DME and/or AE instruction;Therapeutic activities;Cognitive remediation/compensation;Patient/family education;Balance training    OT Goals(Current goals can be found in the care plan section) Acute Rehab OT Goals Patient Stated Goal: to get stonger OT Goal Formulation: With patient Time For Goal Achievement: 02/19/19 Potential to Achieve Goals: Good  OT Frequency: Min 2X/week   Barriers to D/C:            Co-evaluation              AM-PAC OT "6 Clicks" Daily Activity     Outcome Measure Help from another person eating meals?: None Help from another person taking care of personal grooming?: A Little Help from another person toileting, which includes using toliet, bedpan, or urinal?: A Little Help from another person bathing (including washing, rinsing, drying)?: A Lot Help from another person to put on and taking off regular upper body clothing?: A Little Help from another person to put on and taking off regular lower body clothing?: A Lot 6 Click Score: 17   End of Session Equipment Utilized During Treatment: Oxygen(2L) Nurse Communication: Mobility status  Activity Tolerance: Patient limited by fatigue Patient left: in bed;with call bell/phone within reach;with SCD's reapplied;with family/visitor present  OT Visit Diagnosis: Unsteadiness on feet (R26.81);Muscle weakness (generalized) (M62.81);Other symptoms and signs involving cognitive function                Time: 8016-5537 OT Time Calculation (min): 21 min Charges:  OT General Charges $OT Visit: 1 Visit OT Evaluation $OT Eval Moderate Complexity: [place], OT/L   Acute OT Clinical Specialist Acute Rehabilitation Services Pager [phone] Office [phone]   [facility] 02/05/2019, 4:33 PM

## 2019-02-05 NOTE — Progress Notes (Addendum)
PCCM INTERVAL PROGRESS NOTE  Called to bedside to evaluate patient for hypoxemia and AMS. Briefly this is a 68 year old male with cardiac history and melanoma admitted 3/5 with septic shocks secondary to PNA. He required mechanical ventilation and pressors. He was extubated 3/8 and had initially tolerated well, but in the evening hours of 3/9 he developed worsening hypoxia requiring escalation to BiPAP. Also had alteration in mental status. He was given a total of 120mg  IV lasix, and made some reasonable output, however, his respiratory status continued to decline. He will not keep BiPAP on and sats maintaining in mid 80s on 100%. Cough productive for pink frothy sputum.   Acute hypoxemic respiratory failure: Pulmonary edema favored, but has not improved much with diuresis radiographically, which does raise some concern for a pneumonitis secondary to limnologic therapy.    Plan - Emergent intubation - Has been diuresed, monitor output.  - Propofol for RASS goal -1 to -2 - VAP prevention protocol - Continue steroids  Family updated via telephone [name])  Critical care time 34 minutes  Georgann Housekeeper, AGACNP-BC [place] Pager [phone] or [phone]  02/05/2019 11:58 PM

## 2019-02-06 ENCOUNTER — Inpatient Hospital Stay (HOSPITAL_COMMUNITY): Payer: Medicare HMO

## 2019-02-06 DIAGNOSIS — J810 Acute pulmonary edema: Secondary | ICD-10-CM

## 2019-02-06 LAB — GLUCOSE, CAPILLARY
Glucose-Capillary: 110 mg/dL — ABNORMAL HIGH (ref 70–99)
Glucose-Capillary: 113 mg/dL — ABNORMAL HIGH (ref 70–99)
Glucose-Capillary: 115 mg/dL — ABNORMAL HIGH (ref 70–99)
Glucose-Capillary: 131 mg/dL — ABNORMAL HIGH (ref 70–99)
Glucose-Capillary: 169 mg/dL — ABNORMAL HIGH (ref 70–99)

## 2019-02-06 LAB — CBC
HCT: 42.5 % (ref 39.0–52.0)
Hemoglobin: 13.6 g/dL (ref 13.0–17.0)
MCH: 28.8 pg (ref 26.0–34.0)
MCHC: 32 g/dL (ref 30.0–36.0)
MCV: 89.9 fL (ref 80.0–100.0)
Platelets: 47 10*3/uL — ABNORMAL LOW (ref 150–400)
RBC: 4.73 MIL/uL (ref 4.22–5.81)
RDW: 14.2 % (ref 11.5–15.5)
WBC: 9.9 10*3/uL (ref 4.0–10.5)
nRBC: 0.4 % — ABNORMAL HIGH (ref 0.0–0.2)

## 2019-02-06 LAB — CULTURE, BLOOD (ROUTINE X 2)
Culture: NO GROWTH
Culture: NO GROWTH
Special Requests: ADEQUATE

## 2019-02-06 LAB — BASIC METABOLIC PANEL
Anion gap: 13 (ref 5–15)
BUN: 65 mg/dL — ABNORMAL HIGH (ref 8–23)
CO2: 20 mmol/L — ABNORMAL LOW (ref 22–32)
CREATININE: 1.97 mg/dL — AB (ref 0.61–1.24)
Calcium: 8.2 mg/dL — ABNORMAL LOW (ref 8.9–10.3)
Chloride: 109 mmol/L (ref 98–111)
GFR calc Af Amer: 40 mL/min — ABNORMAL LOW (ref >60)
GFR, EST NON AFRICAN AMERICAN: 35 mL/min — AB (ref >60)
Glucose, Bld: 125 mg/dL — ABNORMAL HIGH (ref 70–99)
Potassium: 3.8 mmol/L (ref 3.5–5.1)
Sodium: 142 mmol/L (ref 135–145)

## 2019-02-06 LAB — POCT I-STAT 7, (LYTES, BLD GAS, ICA,H+H)
Acid-Base Excess: 4 mmol/L — ABNORMAL HIGH (ref 0.0–2.0)
Bicarbonate: 28.6 mmol/L — ABNORMAL HIGH (ref 20.0–28.0)
Calcium, Ion: 1.12 mmol/L — ABNORMAL LOW (ref 1.15–1.40)
HCT: 40 % (ref 39.0–52.0)
Hemoglobin: 13.6 g/dL (ref 13.0–17.0)
O2 Saturation: 100 %
Patient temperature: 98
Potassium: 3.6 mmol/L (ref 3.5–5.1)
Sodium: 144 mmol/L (ref 135–145)
TCO2: 30 mmol/L (ref 22–32)
pCO2 arterial: 42.6 mmHg (ref 32.0–48.0)
pH, Arterial: 7.433 (ref 7.350–7.450)
pO2, Arterial: 438 mmHg — ABNORMAL HIGH (ref 83.0–108.0)

## 2019-02-06 LAB — PHOSPHORUS: Phosphorus: 5.2 mg/dL — ABNORMAL HIGH (ref 2.5–4.6)

## 2019-02-06 LAB — TRIGLYCERIDES: Triglycerides: 117 mg/dL (ref <150)

## 2019-02-06 LAB — MAGNESIUM: Magnesium: 2.5 mg/dL — ABNORMAL HIGH (ref 1.7–2.4)

## 2019-02-06 MED ORDER — FENTANYL CITRATE (PF) 100 MCG/2ML IJ SOLN
INTRAMUSCULAR | Status: AC
  Administered 2019-02-06: 100 ug
  Filled 2019-02-06: qty 2

## 2019-02-06 MED ORDER — ETOMIDATE 2 MG/ML IV SOLN: 20.0000 mg | Freq: Once | INTRAVENOUS | Status: AC

## 2019-02-06 MED ORDER — SODIUM CHLORIDE 0.9 % IV BOLUS
1000.0000 mL | Freq: Once | INTRAVENOUS | Status: AC
  Administered 2019-02-06: 1000 mL via INTRAVENOUS

## 2019-02-06 MED ORDER — FENTANYL CITRATE (PF) 100 MCG/2ML IJ SOLN: 50.0000 ug | INTRAMUSCULAR | Status: DC | PRN

## 2019-02-06 MED ORDER — PROPOFOL 1000 MG/100ML IV EMUL
0.0000 ug/kg/min | INTRAVENOUS | Status: DC
  Administered 2019-02-06: 10 ug/kg/min via INTRAVENOUS
  Administered 2019-02-06: 15 ug/kg/min via INTRAVENOUS
  Administered 2019-02-06: 25 ug/kg/min via INTRAVENOUS
  Administered 2019-02-07: 20 ug/kg/min via INTRAVENOUS
  Filled 2019-02-06 (×4): qty 100

## 2019-02-06 MED ORDER — MIDAZOLAM HCL 2 MG/2ML IJ SOLN
INTRAMUSCULAR | Status: AC
  Administered 2019-02-06: 2 mg
  Filled 2019-02-06: qty 2

## 2019-02-06 MED ORDER — PROPOFOL 1000 MG/100ML IV EMUL
INTRAVENOUS | Status: AC
  Administered 2019-02-06: 10 ug/kg/min via INTRAVENOUS
  Filled 2019-02-06: qty 100

## 2019-02-06 MED ORDER — METOLAZONE 5 MG PO TABS
5.0000 mg | ORAL_TABLET | Freq: Every day | ORAL | Status: AC
  Administered 2019-02-06: 5 mg via ORAL
  Filled 2019-02-06: qty 1

## 2019-02-06 MED ORDER — POTASSIUM CHLORIDE 20 MEQ/15ML (10%) PO SOLN
40.0000 meq | Freq: Three times a day (TID) | ORAL | Status: AC
  Administered 2019-02-06 (×2): 40 meq
  Filled 2019-02-06 (×2): qty 30

## 2019-02-06 MED ORDER — ETOMIDATE 2 MG/ML IV SOLN
20.0000 mg | Freq: Once | INTRAVENOUS | Status: AC
  Administered 2019-02-06: 20 mg via INTRAVENOUS

## 2019-02-06 MED ORDER — FENTANYL CITRATE (PF) 100 MCG/2ML IJ SOLN
100.0000 ug | Freq: Once | INTRAMUSCULAR | Status: AC
  Administered 2019-02-06: 100 ug via INTRAVENOUS

## 2019-02-06 MED ORDER — FUROSEMIDE 10 MG/ML IJ SOLN
5.0000 mg/h | INTRAVENOUS | Status: AC
  Administered 2019-02-06: 5 mg/h via INTRAVENOUS
  Filled 2019-02-06: qty 20

## 2019-02-06 MED ORDER — CHLORHEXIDINE GLUCONATE 0.12% ORAL RINSE (MEDLINE KIT)
15.0000 mL | Freq: Two times a day (BID) | OROMUCOSAL | Status: DC
  Administered 2019-02-06 (×2): 15 mL via OROMUCOSAL

## 2019-02-06 MED ORDER — ROCURONIUM BROMIDE 50 MG/5ML IV SOLN
50.0000 mg | Freq: Once | INTRAVENOUS | Status: AC
  Administered 2019-02-06: 50 mg via INTRAVENOUS

## 2019-02-06 MED ORDER — FENTANYL CITRATE (PF) 100 MCG/2ML IJ SOLN
50.0000 ug | INTRAMUSCULAR | Status: DC | PRN
  Administered 2019-02-06: 50 ug via INTRAVENOUS
  Filled 2019-02-06: qty 2

## 2019-02-06 NOTE — Progress Notes (Signed)
Patient self extubated at 1605. Sedation paused Dr.Yacoub present on unit patient in no respiratory distress. Dr Nelda Marseille reintubated patient immediately. Patient tolerated well. Family notified. Patient stable. Family verbalized understanding.

## 2019-02-06 NOTE — Procedures (Signed)
OGT Placement By MD  OGT placed under direct laryngoscopy and confirmed by auscultation  Rush Farmer, M.D. [facility] Pulmonary/Critical Care Medicine. Pager: [phone]. After hours pager: [phone].

## 2019-02-06 NOTE — Progress Notes (Signed)
[facility] Progress Note Patient Name: Guerin Lashomb DOB: 1951-05-01 MRN: [id number]   Date of Service  02/06/2019  HPI/Events of Note  Hypotension - BP = 77/54 with MAP = 63. CVP = 4.   eICU Interventions  Will order: 1. Bolus with 0.9 NaCl 1 liter IV over 1 hour now.      Intervention Category Major Interventions: Hypotension - evaluation and management  Cortina Vultaggio [place] 02/06/2019, 1:28 AM

## 2019-02-06 NOTE — Progress Notes (Signed)
PT Cancellation Note  Patient Details Name: Lee Hall MRN: [id number] DOB: Dec 18, 1950   Cancelled Treatment:    Reason Eval/Treat Not Completed: Medical issues which prohibited therapy per RN patient still intubated. Will cont to hold and await readiness.   Reinaldo Berber, PT, DPT Acute Rehabilitation Services Pager: [phone] Office: [phone]     Reinaldo Berber 02/06/2019, 3:40 PM

## 2019-02-06 NOTE — Progress Notes (Signed)
Progress Note  Patient Name: Lee Hall Date of Encounter: 02/06/2019  Primary Cardiologist:   Will Meredith Leeds, MD   Subjective   Events noted overnight, including respiratory decompensation. Now re-intubated. Aggressively diuresed about 3L Negative.   Inpatient Medications    Scheduled Meds: . aspirin EC  81 mg Oral Daily  . chlorhexidine gluconate (MEDLINE KIT)  15 mL Mouth Rinse BID  . docusate sodium  100 mg Oral BID  . heparin  5,000 Units Subcutaneous Q8H  . insulin aspart  3-9 Units Subcutaneous Q4H  . mouth rinse  15 mL Mouth Rinse BID  . methylPREDNISolone (SOLU-MEDROL) injection  60 mg Intravenous Q8H  . pantoprazole (PROTONIX) IV  40 mg Intravenous Q24H  . potassium chloride  40 mEq Per Tube TID   Continuous Infusions: . ceFEPime (MAXIPIME) IV 2 g (02/06/19 1325)  . furosemide (LASIX) infusion 5 mg/hr (02/06/19 1300)  . propofol (DIPRIVAN) infusion 15 mcg/kg/min (02/06/19 1440)  . vancomycin 1,250 mg (02/06/19 1445)   PRN Meds: bisacodyl, fentaNYL (SUBLIMAZE) injection, fentaNYL (SUBLIMAZE) injection, ipratropium-albuterol   Vital Signs    Vitals:   02/06/19 1300 02/06/19 1400 02/06/19 1500 02/06/19 1542  BP: 99/66 100/67 97/61   Pulse: 60 (!) 26 (!) 57 (!) 59  Resp: '15 16 15 16  ' Temp:      TempSrc:      SpO2: 100% (!) 79% 100% 99%  Weight:      Height:        Intake/Output Summary (Last 24 hours) at 02/06/2019 1603 Last data filed at 02/06/2019 1300 Gross per 24 hour  Intake 1441.39 ml  Output 5605 ml  Net -4163.61 ml   Filed Weights   02/03/19 0500 02/04/19 0409 02/06/19 0430  Weight: 90.1 kg 91 kg 90.9 kg    Telemetry    Paced rhythm- Personally Reviewed  ECG    NA - Personally Reviewed  Physical Exam   General appearance: intubated, lightly sedated, opens eyes to stimulus Neck: JVD - 5 cm above sternal notch, no carotid bruit and thyroid not enlarged, symmetric, no tenderness/mass/nodules Lungs: diminished breath  sounds bibasilar Heart: regular rate and rhythm Abdomen: soft, non-tender; bowel sounds normal; no masses,  no organomegaly Extremities: edema 1+ edema Pulses: 2+ and symmetric Skin: pale, warm, dry Neurologic: Mental status: intubated, lightly sedated on vent Psych: Cannot assess   Labs    Chemistry Recent Labs  Lab 02/01/19 2138  02/03/19 0453 02/04/19 0452 02/05/19 0432 02/06/19 0122 02/06/19 0125  NA 138   < > 143 146* 143 144 142  K 4.6   < > 3.8 4.1 4.1 3.6 3.8  CL 115*   < > 108 110 111  --  109  CO2 13*   < > '23 27 26  ' --  20*  GLUCOSE 277*   < > 228* 255* 152*  --  125*  BUN 39*   < > 54* 60* 61*  --  65*  CREATININE 2.23*   < > 2.36* 2.04* 1.84*  --  1.97*  CALCIUM 6.2*   < > 7.4* 7.6* 8.1*  --  8.2*  PROT 4.9*  --  4.9*  --  5.0*  --   --   ALBUMIN 2.3*  --  2.2*  --  2.5*  --   --   AST 6,759*  --  8,443*  --  616*  --   --   ALT 3,747*  --  5,035*  --  2,457*  --   --  ALKPHOS 162*  --  179*  --  157*  --   --   BILITOT 1.6*  --  1.6*  --  1.4*  --   --   GFRNONAA 30*   < > 28* 33* 38*  --  35*  GFRAA 35*   < > 32* 38* 44*  --  40*  ANIONGAP 10   < > '12 9 6  ' --  13   < > = values in this interval not displayed.     Hematology Recent Labs  Lab 02/04/19 0452 02/05/19 0557 02/06/19 0122 02/06/19 0125  WBC 14.3* 12.9*  --  9.9  RBC 4.14* 4.44  --  4.73  HGB 12.2* 13.1 13.6 13.6  HCT 38.6* 42.1 40.0 42.5  MCV 93.2 94.8  --  89.9  MCH 29.5 29.5  --  28.8  MCHC 31.6 31.1  --  32.0  RDW 14.4 14.5  --  14.2  PLT 83* 50*  --  47*    Cardiac Enzymes Recent Labs  Lab 02/01/19 1058 02/01/19 1424 02/01/19 2138  TROPONINI 0.06* 0.19* 0.16*   No results for input(s): TROPIPOC in the last 168 hours.   BNP Recent Labs  Lab 01/31/19 1628  PROBNP 1,698.0*     DDimer  Recent Labs  Lab 01/31/19 1628  DDIMER 0.49     Radiology    Dg Chest 1 View  Result Date: 02/05/2019 CLINICAL DATA:  Respiratory difficulty. EXAM: CHEST  1 VIEW COMPARISON:   Earlier same day FINDINGS: Right internal jugular central line tip in the SVC above the right atrium. Pacemaker/AICD appears the same. Slight worsening of bilateral interstitial and alveolar edema right worse than left. Probable small effusions. Coexistent pneumonia could certainly be present. IMPRESSION: Slight radiographic worsening since earlier today. Interstitial and alveolar density most consistent with edema. Coexistent pneumonia not excluded. Electronically Signed   By: Nelson Chimes M.D.   On: 02/05/2019 19:18   Portable Chest X-ray  Result Date: 02/06/2019 CLINICAL DATA:  Intubation. EXAM: PORTABLE CHEST 1 VIEW COMPARISON:  Chest radiograph February 05, 2019 at 1902 hours. FINDINGS: Endotracheal tube tip projects 7 cm above the carina. Nasogastric tube past proximal stomach, distal tip out of field of view. RIGHT internal jugular central venous catheter distal tip projects in mid superior vena cava. No pneumothorax. Interstitial and alveolar airspace opacities similar to prior examination. Retrocardiac air bronchograms. Small pleural effusions. Cardiac silhouette is mildly enlarged. Mediastinal silhouette is not suspicious. LEFT AICD in situ. Soft tissue planes included osseous structures are unchanged, surgical clips RIGHT axilla. IMPRESSION: 1. Endotracheal tube tip projects 7 cm above the carina. Nasogastric tube past proximal stomach. RIGHT internal jugular central venous catheter distal tip projects in mid superior vena cava. 2. Similar interstitial and alveolar airspace opacities seen with pulmonary edema and/or pneumonia. Small pleural effusions. 3. Mild cardiomegaly. Electronically Signed   By: Elon Alas M.D.   On: 02/06/2019 00:26   Dg Chest Port 1 View  Result Date: 02/05/2019 CLINICAL DATA:  Respiratory failure EXAM: PORTABLE CHEST 1 VIEW COMPARISON:  02/04/2019 FINDINGS: Chronic cardiomegaly. Dual-chamber ICD/pacer leads from the left. Right IJ line with tip at the SVC. Interval  extubation. Asymmetric hazy opacity in the right lung which could be infection or asymmetric edema. There is generalized interstitial coarsening and pleural fluid. No pneumothorax. IMPRESSION: Probable inferior flow pleural fluid. Otherwise stable chest with asymmetric interstitial and airspace disease. Electronically Signed   By: Monte Fantasia M.D.   On: 02/05/2019  07:25    Cardiac Studies   Echo 02/01/19  1. The left ventricle has severely reduced systolic function, with an ejection fraction of 20-25%. The cavity size was moderately dilated. Left ventricular diastology could not be evaluated due to nondiagnostic images. Elevated left atrial and left  ventricular end-diastolic pressures.  2. There is akinesis of the mid to apical inferior, anterior, lateral, inferolateral, apical and apical septal walls. There is akinesis of the mid anteroseptal and inferoseptal wall.  3. The right ventricle has normal systolic function. The cavity was normal. There is no increase in right ventricular wall thickness. Right ventricular systolic pressure is moderately elevated with an estimated pressure of 55.4 mmHg.  4. Left atrial size was severely dilated.  5. The mitral valve is normal in structure. There is mild mitral annular calcification present. Mitral valve regurgitation is moderate to severe by color flow Doppler.  6. The tricuspid valve is normal in structure. Tricuspid valve regurgitation is mild-moderate.  7. The aortic valve is tricuspid Moderate thickening of the aortic valve Moderate calcification of the aortic valve. Aortic valve regurgitation is moderate by color flow Doppler.  8. The pulmonic valve was normal in structure.  9. The inferior vena cava was dilated in size with <50% respiratory variability  Patient Profile     68 y.o. male with a hx of ICM s/p ICD/PPM, VT CAD with occluded LAD/collaterals, melanoma, who is being seen for the evaluation of cardiac arrest and + troponin at the  request of Dr. Nelda Marseille.  Assessment & Plan    SEPTIC SHOCK/RESPIRATORY FAILURE:  Now off pressors, extubated. BP remains in the 47'M systolic. Markedly elevated AST/ALT - improving. Leukocytosis improved to 12.9K today, creatinine improving.  Developed worsening respiratory picture yesterday requiring re-intubation. Management per PCCM.  ACUTE ON CHRONIC CHF:   LVEF 20-25% - he is now on a lasix gtts and zaroxlyn with good diuresis. Holding additional HF medications due to hypotension.  ELEVATED TROPONIN:  Known CAD.  I suspect that this is demand ischemia.  Will not pursue invasive work-up given known underlying disease including occluded LAD with collaterals. Restart low dose aspirin.  ICD/PPM : No active issues - history of VT, but none noted currently.   For questions or updates, please contact [place] Please consult [email or web address] for contact info under Cardiology/STEMI.   Pixie Casino, MD, [facility], [place] Director of the [facility] Lipid Disorders &  Cardiovascular Risk Reduction Clinic Diplomate of the American Board of Clinical Lipidology Attending Cardiologist  Direct Dial: [phone]  Fax: [phone]  Website:  [email or web address]  Pixie Casino, MD  02/06/2019, 4:03 PM

## 2019-02-06 NOTE — Procedures (Signed)
Intubation Procedure Note Lee Hall  [id number]  May 14, 1951    Procedure: Intubation Indications: Respiratory insufficiency   Procedure Details Consent: Risks of procedure as well as the alternatives and risks of each were explained to the (patient/caregiver).  Consent for procedure obtained. Time Out: Performed   Maximum clean technique was used including gloves, hand hygiene and mask  Laryngoscopy equipment used: MAC 4 Glidescope   Medications: Fentanyl 100 mcg, Etomidate 20 mg   Grade 1 airway view   Evaluation Hemodynamic Status: BP stable throughout, transiently fell during during procedure Patient's Current Condition: stable Patient did tolerate procedure well Chest X-ray ordered to verify placement.  tube position acceptable  Georgann Housekeeper, AGACNP-BC [place] Pager [phone] or [phone]  02/06/2019 12:34 AM

## 2019-02-06 NOTE — Procedures (Signed)
Intubation Procedure Note Keng Jewel [id number] 11/09/1951  Procedure: Intubation Indications: Airway protection and maintenance  Procedure Details Consent: Unable to obtain consent because of emergent medical necessity. Time Out: Verified patient identification, verified procedure, site/side was marked, verified correct patient position, special equipment/implants available, medications/allergies/relevent history reviewed, required imaging and test results available.  Performed  Maximum sterile technique was used including gloves, hand hygiene and mask.  MAC    Evaluation Hemodynamic Status: BP stable throughout; O2 sats: stable throughout Patient's Current Condition: stable Complications: No apparent complications Patient did tolerate procedure well. Chest X-ray ordered to verify placement.  CXR: pending.   Terius Jacuinde 02/06/2019

## 2019-02-06 NOTE — Progress Notes (Signed)
NAME:  Lee Hall, MRN:  [id number], DOB:  May 20, 1951, LOS: 5 ADMISSION DATE:  02/01/2019, CONSULTATION DATE:  3/5 REFERRING MD:  EDP, CHIEF COMPLAINT:  Shock, respiratory failure    Brief History   68 yo male presented with dyspnea, leg swelling, and hypotension.  Had brief cardiac arrest in ER.  Found to have septic shock from RUL PNA.  Had been on outpt ABx prior to admission.    Past Medical History  CAD, ischemic CM with systolic CHF EF 21%, VT s/p ICD/PM on amiodarone, Melanoma on immune therapy (nivolaub)  Significant Hospital Events   3/5 cardiac arrest  Consults:  Cardiology  Procedures:  ETT 3/05 >>3/8 ETT 3/9>>> Rt IJ CVL 3/05 >> Lt radial aline 3/05 >> 3/07  Significant Diagnostic Tests:  2D echo 3/5 >> EF 20 to 25%, mild/mod MR, mild/mod TR, mod AR  Micro Data:  Blood cultures X2 3/5 >> Sputum 3/5 >> Respiratory viral panel 3/05 >> negative  Antimicrobials:  Rocephin 3/5 x 1 Azithromycin 3/5>>> 3/7 Cefepime 3/5>> Vancomycin 3/5 >>  Interim history/subjective:  Reintubated overnight due to evolving pulmonary   Objective   Blood pressure 99/70, pulse 60, temperature (!) 97.5 F (36.4 C), temperature source Axillary, resp. rate 14, height 5\' 11"  (1.803 m), weight 90.9 kg, SpO2 100 %. CVP:  [4 mmHg-6 mmHg] 6 mmHg  Vent Mode: PRVC FiO2 (%):  [40 %-100 %] 40 % Set Rate:  [16 bmp] 16 bmp Vt Set:  [600 mL] 600 mL PEEP:  [8 cmH20] 8 cmH20 Plateau Pressure:  [14 cmH20-19 cmH20] 14 cmH20   Intake/Output Summary (Last 24 hours) at 02/06/2019 0908 Last data filed at 02/06/2019 0744 Gross per 24 hour  Intake 1666.38 ml  Output 5330 ml  Net -3663.62 ml   Filed Weights   02/03/19 0500 02/04/19 0409 02/06/19 0430  Weight: 90.1 kg 91 kg 90.9 kg    Examination:  General - Acutely ill appearing male, NAD Eyes - PERRL, EOM-I ENT - Supple and -LAN Cardiac - RRR, Nl S1/S2 and -M/R/G Chest - Bibasilar crackles Abdomen - Soft, NT, ND and +BS, flank  edema noted Extremities - -edema and -tenderness Skin - no rashes Neuro - follows commands, moves extremities  I reviewed CXR myself, pulmonary edema  Resolved Hospital Problem list   Lactic acidosis  Assessment & Plan:   Septic shock from pneumonia. Discussion: Was recently on ABx as outpt.  Has been on immunomodulatory therapy for melanoma. Plan - Day 6/7 vancomycin, cefepime, stop date in place - D/C pressors  Acute hypoxic respiratory failure with pulmonary infiltrates. Discussion: Likely from pneumonia.  Other possibility is from immunotherapy induced pneumonitis. Plan - Titrate O2 for sat of 88-92% - Full vent support - Hold off weaning for now - IS - Flutter valve - Continue solumedrol to 40 mg q8h, will continue for now - PRN BDs - Active diureses as below  Acute renal failure from ATN in setting of hypoxia, hypotension (baseline creatinine 1.07 from 01/19/19). Urine retention. Lactic acidosis. Plan - F/u BMET - Keep foley in for now - Defer nephrology consult for now - Lasix 5 mg IV drip - Zaroxolyn 5 mg PO x1 - KCl PO - KVO IVF  Elevated LFTs likely from Shock. Plan - F/u LFTs in AM  Cardiac arrest in setting of respiratory failure and septic shock. Hx CAD, ischemic CM with chronic systolic CHF, VT, valvular heart disease. Plan - Hold outpt amiodarone, entresto, coreg, mexitil - Cardiology following, appreciate  input  DM type II with hyperglycemia. Plan - SSI - Hold lantus now that he is off tube feeds after extubation - Holding home metformin, Jardiance  Acute metabolic encephalopathy. Plan - Minimize sedation - Reorient - Transfer out  Hx of T3, N3 melanoma Rt shoulder dx January 2019. Discussion: Followed by Dr. Alen Blew.  Has been on Nivolumab since January 2020. Plan - Hold nivolumab, will start after out of the ICU  Discussed with PCCM-NP.  Will hold in the ICU.  Full vent support.  Diureses.  PCCM will assume care.  Best  practice:  Diet: NPO DVT prophylaxis: SQ Heparin GI prophylaxis: Protonix Mobility: Up to chair Code Status: Full Family Communication: no family at bedside Disposition:  ICU  Labs    CMP Latest Ref Rng & Units 02/06/2019 02/06/2019 02/05/2019  Glucose 70 - 99 mg/dL 125(H) - 152(H)  BUN 8 - 23 mg/dL 65(H) - 61(H)  Creatinine 0.61 - 1.24 mg/dL 1.97(H) - 1.84(H)  Sodium 135 - 145 mmol/L 142 144 143  Potassium 3.5 - 5.1 mmol/L 3.8 3.6 4.1  Chloride 98 - 111 mmol/L 109 - 111  CO2 22 - 32 mmol/L 20(L) - 26  Calcium 8.9 - 10.3 mg/dL 8.2(L) - 8.1(L)  Total Protein 6.5 - 8.1 g/dL - - 5.0(L)  Total Bilirubin 0.3 - 1.2 mg/dL - - 1.4(H)  Alkaline Phos 38 - 126 U/L - - 157(H)  AST 15 - 41 U/L - - 616(H)  ALT 0 - 44 U/L - - 2,457(H)   CBC Latest Ref Rng & Units 02/06/2019 02/06/2019 02/05/2019  WBC 4.0 - 10.5 K/uL 9.9 - 12.9(H)  Hemoglobin 13.0 - 17.0 g/dL 13.6 13.6 13.1  Hematocrit 39.0 - 52.0 % 42.5 40.0 42.1  Platelets 150 - 400 K/uL 47(L) - 50(L)   ABG    Component Value Date/Time   PHART 7.433 02/06/2019 0122   PCO2ART 42.6 02/06/2019 0122   PO2ART 438.0 (H) 02/06/2019 0122   HCO3 28.6 (H) 02/06/2019 0122   TCO2 30 02/06/2019 0122   ACIDBASEDEF 5.7 (H) 02/02/2019 0333   O2SAT 100.0 02/06/2019 0122   CBG (last 3)  Recent Labs    02/05/19 2323 02/06/19 0312 02/06/19 0744  GLUCAP 122* 110* 113*   The patient is critically ill with multiple organ systems failure and requires high complexity decision making for assessment and support, frequent evaluation and titration of therapies, application of advanced monitoring technologies and extensive interpretation of multiple databases.   Critical Care Time devoted to patient care services described in this note is  33  Minutes. This time reflects time of care of this signee Dr Jennet Maduro. This critical care time does not reflect procedure time, or teaching time or supervisory time of PA/NP/Med student/Med Resident etc but could involve  care discussion time.  Rush Farmer, M.D. [facility] Pulmonary/Critical Care Medicine. Pager: [phone]. After hours pager: [phone].

## 2019-02-07 ENCOUNTER — Inpatient Hospital Stay (HOSPITAL_COMMUNITY): Payer: Medicare HMO

## 2019-02-07 LAB — CBC
HCT: 44 % (ref 39.0–52.0)
Hemoglobin: 14.1 g/dL (ref 13.0–17.0)
MCH: 28.6 pg (ref 26.0–34.0)
MCHC: 32 g/dL (ref 30.0–36.0)
MCV: 89.2 fL (ref 80.0–100.0)
Platelets: 46 10*3/uL — ABNORMAL LOW (ref 150–400)
RBC: 4.93 MIL/uL (ref 4.22–5.81)
RDW: 14.3 % (ref 11.5–15.5)
WBC: 9.5 10*3/uL (ref 4.0–10.5)
nRBC: 0.3 % — ABNORMAL HIGH (ref 0.0–0.2)

## 2019-02-07 LAB — POCT I-STAT 7, (LYTES, BLD GAS, ICA,H+H)
ACID-BASE EXCESS: 12 mmol/L — AB (ref 0.0–2.0)
Bicarbonate: 34.5 mmol/L — ABNORMAL HIGH (ref 20.0–28.0)
Calcium, Ion: 1.13 mmol/L — ABNORMAL LOW (ref 1.15–1.40)
HEMATOCRIT: 38 % — AB (ref 39.0–52.0)
Hemoglobin: 12.9 g/dL — ABNORMAL LOW (ref 13.0–17.0)
O2 Saturation: 99 %
Patient temperature: 98
Potassium: 3.5 mmol/L (ref 3.5–5.1)
SODIUM: 144 mmol/L (ref 135–145)
TCO2: 36 mmol/L — ABNORMAL HIGH (ref 22–32)
pCO2 arterial: 35 mmHg (ref 32.0–48.0)
pH, Arterial: 7.601 (ref 7.350–7.450)
pO2, Arterial: 109 mmHg — ABNORMAL HIGH (ref 83.0–108.0)

## 2019-02-07 LAB — BASIC METABOLIC PANEL
Anion gap: 11 (ref 5–15)
BUN: 68 mg/dL — AB (ref 8–23)
CO2: 30 mmol/L (ref 22–32)
Calcium: 8.6 mg/dL — ABNORMAL LOW (ref 8.9–10.3)
Chloride: 105 mmol/L (ref 98–111)
Creatinine, Ser: 2.08 mg/dL — ABNORMAL HIGH (ref 0.61–1.24)
GFR calc Af Amer: 38 mL/min — ABNORMAL LOW (ref >60)
GFR calc non Af Amer: 32 mL/min — ABNORMAL LOW (ref >60)
GLUCOSE: 200 mg/dL — AB (ref 70–99)
Potassium: 3.7 mmol/L (ref 3.5–5.1)
Sodium: 146 mmol/L — ABNORMAL HIGH (ref 135–145)

## 2019-02-07 LAB — MAGNESIUM: Magnesium: 2.5 mg/dL — ABNORMAL HIGH (ref 1.7–2.4)

## 2019-02-07 LAB — GLUCOSE, CAPILLARY
Glucose-Capillary: 148 mg/dL — ABNORMAL HIGH (ref 70–99)
Glucose-Capillary: 150 mg/dL — ABNORMAL HIGH (ref 70–99)
Glucose-Capillary: 153 mg/dL — ABNORMAL HIGH (ref 70–99)
Glucose-Capillary: 164 mg/dL — ABNORMAL HIGH (ref 70–99)
Glucose-Capillary: 174 mg/dL — ABNORMAL HIGH (ref 70–99)
Glucose-Capillary: 187 mg/dL — ABNORMAL HIGH (ref 70–99)
Glucose-Capillary: 99 mg/dL (ref 70–99)

## 2019-02-07 LAB — PHOSPHORUS: Phosphorus: 5.5 mg/dL — ABNORMAL HIGH (ref 2.5–4.6)

## 2019-02-07 MED ORDER — CHLORHEXIDINE GLUCONATE 0.12% ORAL RINSE (MEDLINE KIT)
15.0000 mL | Freq: Two times a day (BID) | OROMUCOSAL | Status: DC
  Administered 2019-02-07 – 2019-02-08 (×3): 15 mL via OROMUCOSAL

## 2019-02-07 MED ORDER — ORAL CARE MOUTH RINSE
15.0000 mL | OROMUCOSAL | Status: DC
  Administered 2019-02-07 – 2019-02-08 (×12): 15 mL via OROMUCOSAL

## 2019-02-07 MED ORDER — SODIUM CHLORIDE 0.9% FLUSH: 3.0000 mL | INTRAVENOUS | Status: DC | PRN

## 2019-02-07 MED ORDER — SODIUM CHLORIDE 0.9% FLUSH
3.0000 mL | Freq: Two times a day (BID) | INTRAVENOUS | Status: DC
  Administered 2019-02-07 – 2019-02-13 (×13): 3 mL via INTRAVENOUS

## 2019-02-07 MED ORDER — SODIUM CHLORIDE 0.9 % IV SOLN: 250.0000 mL | INTRAVENOUS | Status: DC | PRN

## 2019-02-07 NOTE — Progress Notes (Signed)
NAME:  Lee Hall, MRN:  [id number], DOB:  12/06/1950, LOS: 6 ADMISSION DATE:  02/01/2019, CONSULTATION DATE:  3/5 REFERRING MD:  EDP, CHIEF COMPLAINT:  Shock, respiratory failure    Brief History   68 yo male presented with dyspnea, leg swelling, and hypotension.  Had brief cardiac arrest in ER.  Found to have septic shock from RUL PNA.  Had been on outpt ABx prior to admission.    Past Medical History  CAD, ischemic CM with systolic CHF EF 37%, VT s/p ICD/PM on amiodarone, Melanoma on immune therapy (nivolaub)  Significant Hospital Events   3/5 cardiac arrest  Consults:  Cardiology  Procedures:  ETT 3/05 >>3/8 ETT 3/9>>> Rt IJ CVL 3/05 >> Lt radial aline 3/05 >> 3/07  Significant Diagnostic Tests:  2D echo 3/5 >> EF 20 to 25%, mild/mod MR, mild/mod TR, mod AR  Micro Data:  Blood cultures X2 3/5 >> Sputum 3/5 >> Respiratory viral panel 3/05 >> negative  Antimicrobials:  Rocephin 3/5 x 1 Azithromycin 3/5>>> 3/7 Cefepime 3/5>> Vancomycin 3/5 >>  Interim history/subjective:  No events overnight, no new complaints  Objective   Blood pressure 102/74, pulse (!) 59, temperature (!) 97.5 F (36.4 C), temperature source Axillary, resp. rate 18, height 5\' 11"  (1.803 m), weight 82.1 kg, SpO2 100 %. CVP:  [4 mmHg] 4 mmHg  Vent Mode: PRVC FiO2 (%):  [40 %] 40 % Set Rate:  [16 bmp] 16 bmp Vt Set:  [600 mL] 600 mL PEEP:  [5 cmH20] 5 cmH20 Pressure Support:  [10 cmH20] 10 cmH20 Plateau Pressure:  [15 cmH20-18 cmH20] 17 cmH20   Intake/Output Summary (Last 24 hours) at 02/07/2019 1032 Last data filed at 02/07/2019 0858 Gross per 24 hour  Intake 699.46 ml  Output 7250 ml  Net -6550.54 ml   Filed Weights   02/04/19 0409 02/06/19 0430 02/07/19 0438  Weight: 91 kg 90.9 kg 82.1 kg    Examination:  General - Acutely ill appearing, NAD Eyes - PERRL, EOM-I ENT - Supple, -LAN Cardiac - RRR, Nl S1/S2 and -M/R/G Chest - Coarse BS diffusely Abdomen - Soft, NT, ND  and +BS Extremities - -edema and -tenderness Skin - no rashes Neuro - follows commands, moves extremities  I reviewed CXR myself, ETT is in a good position  Resolved Hospital Problem list   Lactic acidosis  Assessment & Plan:   Septic shock from pneumonia. Discussion: Was recently on ABx as outpt.  Has been on immunomodulatory therapy for melanoma. Plan - Day 7/7 vancomycin, cefepime, stop date in place - D/C pressors  Acute hypoxic respiratory failure with pulmonary infiltrates. Discussion: Likely from pneumonia.  Other possibility is from immunotherapy induced pneumonitis. Plan - Titrate O2 for sat of 88-92% - Extubate today - IS - Flutter valve - Continue solumedrol to 40 mg q8h, will continue for now - PRN BDs - Hold off further diureses today  Acute renal failure from ATN in setting of hypoxia, hypotension (baseline creatinine 1.07 from 01/19/19). Urine retention. Lactic acidosis. Plan - F/u BMET - Keep foley in for now - Defer nephrology consult for now - [facility] IVF  Elevated LFTs likely from Shock. Plan - LFTs to PRN at this point  Cardiac arrest in setting of respiratory failure and septic shock. Hx CAD, ischemic CM with chronic systolic CHF, VT, valvular heart disease. Plan - Hold outpt amiodarone, entresto, coreg, mexitil - Cardiology following, appreciate input  DM type II with hyperglycemia. Plan - SSI - Hold lantus now  that he is off tube feeds after extubation - Holding home metformin, Jardiance  Acute metabolic encephalopathy. Plan - Minimize sedation - Reorient - Transfer out  Hx of T3, N3 melanoma Rt shoulder dx January 2019. Discussion: Followed by Dr. Alen Blew.  Has been on Nivolumab since January 2020. Plan - Hold nivolumab, will start after out of the ICU  Extubate today, hold further diureses, monitor clinically, replace electrolytes, PCCM will continue to manage for now  Best practice:  Diet: NPO DVT prophylaxis: SQ Heparin GI  prophylaxis: Protonix Mobility: Up to chair Code Status: Full Family Communication: no family at bedside Disposition:  ICU  Labs    CMP Latest Ref Rng & Units 02/07/2019 02/07/2019 02/06/2019  Glucose 70 - 99 mg/dL 200(H) - 125(H)  BUN 8 - 23 mg/dL 68(H) - 65(H)  Creatinine 0.61 - 1.24 mg/dL 2.08(H) - 1.97(H)  Sodium 135 - 145 mmol/L 146(H) 144 142  Potassium 3.5 - 5.1 mmol/L 3.7 3.5 3.8  Chloride 98 - 111 mmol/L 105 - 109  CO2 22 - 32 mmol/L 30 - 20(L)  Calcium 8.9 - 10.3 mg/dL 8.6(L) - 8.2(L)  Total Protein 6.5 - 8.1 g/dL - - -  Total Bilirubin 0.3 - 1.2 mg/dL - - -  Alkaline Phos 38 - 126 U/L - - -  AST 15 - 41 U/L - - -  ALT 0 - 44 U/L - - -   CBC Latest Ref Rng & Units 02/07/2019 02/07/2019 02/06/2019  WBC 4.0 - 10.5 K/uL 9.5 - 9.9  Hemoglobin 13.0 - 17.0 g/dL 14.1 12.9(L) 13.6  Hematocrit 39.0 - 52.0 % 44.0 38.0(L) 42.5  Platelets 150 - 400 K/uL 46(L) - 47(L)   ABG    Component Value Date/Time   PHART 7.601 (HH) 02/07/2019 0319   PCO2ART 35.0 02/07/2019 0319   PO2ART 109.0 (H) 02/07/2019 0319   HCO3 34.5 (H) 02/07/2019 0319   TCO2 36 (H) 02/07/2019 0319   ACIDBASEDEF 5.7 (H) 02/02/2019 0333   O2SAT 99.0 02/07/2019 0319   CBG (last 3)  Recent Labs    02/06/19 2355 02/07/19 0409 02/07/19 0730  GLUCAP 148* 187* 174*   The patient is critically ill with multiple organ systems failure and requires high complexity decision making for assessment and support, frequent evaluation and titration of therapies, application of advanced monitoring technologies and extensive interpretation of multiple databases.   Critical Care Time devoted to patient care services described in this note is  32  Minutes. This time reflects time of care of this signee Dr Jennet Maduro. This critical care time does not reflect procedure time, or teaching time or supervisory time of PA/NP/Med student/Med Resident etc but could involve care discussion time.  Rush Farmer, M.D. [facility]  Pulmonary/Critical Care Medicine. Pager: [phone]. After hours pager: [phone].

## 2019-02-07 NOTE — Progress Notes (Addendum)
PT Cancellation Note  Patient Details Name: Lee Hall MRN: [id number] DOB: 1951-06-08   Cancelled Treatment:    Reason Eval/Treat Not Completed: Medical issues which prohibited therapy Pt re-intubated due to respiratory decompensation. Will continue with therapy when medically appropriate.      Reinaldo Berber, PT, DPT Acute Rehabilitation Services Pager: [phone] Office: [phone]    Reinaldo Berber 02/07/2019, 9:20 AM

## 2019-02-07 NOTE — Progress Notes (Signed)
  Speech Language Pathology  Patient Details Name: Lee Hall MRN: [id number] DOB: 1951-04-06 Today's Date: 02/07/2019 Time:  -     Order received. Extubated around 11 this am, intubated>extubated x 2 for total of 7 days. Pt would benefit from deferral of swallow eval until tomorrow morning.                       GO                [name] 02/07/2019, 2:35 PM   Orbie Pyo Colvin Caroli.Ed Risk analyst [phone] Office [phone]

## 2019-02-07 NOTE — Progress Notes (Signed)
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 male being evaluated for HIT. Heparin was started 02/01/19 for DVT prophylaxis, and baseline platelets were 207. Of note, patient has been on Nivolumab since January 2020.  HIT labs were ordered on 02/07/19 when platelets dropped to 46.  Auto-populate labs: No results found for: HEPINDPLTAB, SRALOWDOSEHP, SRAHIGHDOSEH    CALCULATE SCORE:  4Ts (see the HIT Algorithm) Score  Thrombocytopenia 2  Timing 0  Thrombosis 0  Other causes of thrombocytopenia 0  Total 2     Recommendations (A or B or C) are based on available lab results:  A. No lab results available (HIT antibody and/or SRA ordered): -Low HIT probability: consider discontinuing HIT labs, continue heparin/LMWH, SRA not recommended; no heparin allergy documentation needed  B. HIT antibody result available: N/A   C. SRA result available: N/A  Name of MD Contacted: [place] (Discussed with provider) Labs ordered: -Heparin antibody Heparin allergy: -no allergy documentation needed Anticoagulation plans -discontinue heparin / LMWH   Jackson Latino, PharmD PGY1 Pharmacy Resident Phone [phone] 02/07/2019     1:51 PM

## 2019-02-07 NOTE — Progress Notes (Signed)
Patient refused bath multiple times today sister aware. Patient agreed to CHG bath.

## 2019-02-07 NOTE — Procedures (Signed)
Extubation Procedure Note  Patient Details:   Name: Lee Hall DOB: [birth year] MRN: [id number]   Airway Documentation:    Vent end date: 02/07/19 Vent end time: 1104   Evaluation  O2 sats: stable throughout Complications: No apparent complications Patient did tolerate procedure well. Bilateral Breath Sounds: Clear, Diminished   Yes   Pt was extubated and placed onto 2 L [place] per order of MD. Pt's vitals are stable. RT will continue to monitor.   Ronaldo Miyamoto 02/07/2019, 11:19 AM

## 2019-02-07 NOTE — Progress Notes (Signed)
OT Cancellation Note  Patient Details Name: Lee Hall MRN: [id number] DOB: 07/31/1951   Cancelled Treatment:    Reason Eval/Treat Not Completed: Medical issues which prohibited therapy. Pt re-intubated due to respiratory decompensation. Will continue with therapy when medically appropriate.   Ramond Dial, OT/L   Acute OT Clinical Specialist Acute Rehabilitation Services Pager [phone] Office [phone]  02/07/2019, 9:14 AM

## 2019-02-07 NOTE — TOC Initial Note (Signed)
Transition of Care [facility]) - Initial/Assessment Note    Patient Details  Name: Lee Hall MRN: [id number] Date of Birth: 22-Feb-1951  Transition of Care [facility]) CM/SW Contact:    Maryclare Labrador, RN Phone Number: 02/07/2019, 10:10 AM  Clinical Narrative:     Pt admitted with dyspnea, leg swelling and hypotension.  Pt required intubation on admit/unsuccessful extubation/  Pt currently intubate.  PTA pt was independent from home alone.  Pts sister serves as support person.           Expected Discharge Plan: [place], CSW will also follow for SNF as back up plan when appropriate     Patient Goals and CMS Choice        Expected Discharge Plan and Services Expected Discharge Plan: [place] Discharge Planning Services: CM Consult   Living arrangements for the past 2 months: Single Family Home                          Prior Living Arrangements/Services Living arrangements for the past 2 months: Single Family Home Lives with:: Self            Care giver support system in place?: Yes (comment)(pts sister)   Criminal Activity/Legal Involvement Pertinent to Current Situation/Hospitalization: No - Comment as needed  Activities of Daily Living      Permission Sought/Granted                  Emotional Assessment         Alcohol / Substance Use: Tobacco Use, Alcohol Use Psych Involvement: No (comment)  Admission diagnosis:  Cardiac arrest ([place]) [I46.9] Severe sepsis ([place]) [A41.9, R65.20] Acute hypoxemic respiratory failure ([place]) [J96.01] Community acquired pneumonia of right lung, unspecified part of lung [J18.9] Patient Active Problem List   Diagnosis Date Noted  . Acute hypoxemic respiratory failure ([place])   . Cardiac arrest ([place])   . Community acquired pneumonia of right lung   . Severe sepsis ([place])   . Septic shock ([place]) 02/01/2019  . Malignant melanoma metastatic to lymph node ([place]) 10/05/2018  . Tremor of right hand  08/07/2018  . Melanoma of skin ([place]) 08/07/2018  . Abnormal positron emission tomography (PET) scan 08/07/2018  . ISCHEMIC CARDIOMYOPATHY 03/19/2009  . PAROXYSMAL VENTRICULAR TACHYCARDIA 03/19/2009  . CAD, NATIVE VESSEL 02/18/2009  . LEFT VENTRICULAR MURAL THROMBUS 02/18/2009  . Chronic systolic heart failure ([place]) 01/02/2009  . TACHYCARDIA 01/02/2009  . Implantable cardioverter-defibrillator (ICD) in situ 01/02/2009   PCP:  [name], MD Pharmacy:   [facility], [place] [address] Phone: [phone] Fax: [place] [address], [place] [address] [place] [address] Phone: [phone] Fax: [phone]     Social Determinants of Health ([place]) Interventions    Readmission Risk Interventions 30 Day Unplanned Readmission Risk Score     ED to Hosp-Admission (Current) from 02/01/2019 in [facility] 21M MEDICAL ICU  30 Day Unplanned Readmission Risk Score (%)  26 Filed at 02/07/2019 0801     This score is the patient's risk of an unplanned readmission within 30 days of being discharged (0 -100%). The score is based on dignosis, age, lab data, medications, orders, and past utilization.   Low:  0-14.9   Medium: 15-21.9   High: 22-29.9   Extreme: 30 and  above       No flowsheet data found.

## 2019-02-08 ENCOUNTER — Encounter (HOSPITAL_COMMUNITY): Payer: Self-pay | Admitting: General Practice

## 2019-02-08 ENCOUNTER — Ambulatory Visit (HOSPITAL_COMMUNITY): Admission: RE | Admit: 2019-02-08 | Payer: Medicare HMO | Source: Ambulatory Visit

## 2019-02-08 DIAGNOSIS — J189 Pneumonia, unspecified organism: Secondary | ICD-10-CM

## 2019-02-08 DIAGNOSIS — R6520 Severe sepsis without septic shock: Secondary | ICD-10-CM

## 2019-02-08 HISTORY — DX: Pneumonia, unspecified organism: J18.9

## 2019-02-08 LAB — BASIC METABOLIC PANEL
Anion gap: 11 (ref 5–15)
BUN: 65 mg/dL — AB (ref 8–23)
CO2: 33 mmol/L — ABNORMAL HIGH (ref 22–32)
Calcium: 8.5 mg/dL — ABNORMAL LOW (ref 8.9–10.3)
Chloride: 99 mmol/L (ref 98–111)
Creatinine, Ser: 1.89 mg/dL — ABNORMAL HIGH (ref 0.61–1.24)
GFR calc Af Amer: 42 mL/min — ABNORMAL LOW (ref >60)
GFR calc non Af Amer: 36 mL/min — ABNORMAL LOW (ref >60)
GLUCOSE: 143 mg/dL — AB (ref 70–99)
Potassium: 3.6 mmol/L (ref 3.5–5.1)
Sodium: 143 mmol/L (ref 135–145)

## 2019-02-08 LAB — CBC
HCT: 44.6 % (ref 39.0–52.0)
HEMOGLOBIN: 14.2 g/dL (ref 13.0–17.0)
MCH: 28.4 pg (ref 26.0–34.0)
MCHC: 31.8 g/dL (ref 30.0–36.0)
MCV: 89.2 fL (ref 80.0–100.0)
Platelets: 40 10*3/uL — ABNORMAL LOW (ref 150–400)
RBC: 5 MIL/uL (ref 4.22–5.81)
RDW: 14.4 % (ref 11.5–15.5)
WBC: 11.1 10*3/uL — ABNORMAL HIGH (ref 4.0–10.5)
nRBC: 0 % (ref 0.0–0.2)

## 2019-02-08 LAB — GLUCOSE, CAPILLARY
GLUCOSE-CAPILLARY: 118 mg/dL — AB (ref 70–99)
Glucose-Capillary: 117 mg/dL — ABNORMAL HIGH (ref 70–99)
Glucose-Capillary: 298 mg/dL — ABNORMAL HIGH (ref 70–99)
Glucose-Capillary: 312 mg/dL — ABNORMAL HIGH (ref 70–99)
Glucose-Capillary: 234 mg/dL — ABNORMAL HIGH (ref 70–99)
Glucose-Capillary: 242 mg/dL — ABNORMAL HIGH (ref 70–99)

## 2019-02-08 LAB — HEPARIN INDUCED PLATELET AB (HIT ANTIBODY): Heparin Induced Plt Ab: 0.083 OD (ref 0.000–0.400)

## 2019-02-08 MED ORDER — METHYLPREDNISOLONE SODIUM SUCC 125 MG IJ SOLR
40.0000 mg | Freq: Two times a day (BID) | INTRAMUSCULAR | Status: DC
  Administered 2019-02-08 – 2019-02-09 (×2): 40 mg via INTRAVENOUS
  Filled 2019-02-08 (×2): qty 2

## 2019-02-08 MED ORDER — INSULIN ASPART 100 UNIT/ML ~~LOC~~ SOLN
0.0000 [IU] | Freq: Three times a day (TID) | SUBCUTANEOUS | Status: DC
  Administered 2019-02-08: 11 [IU] via SUBCUTANEOUS
  Administered 2019-02-08 – 2019-02-09 (×4): 5 [IU] via SUBCUTANEOUS
  Administered 2019-02-10: 3 [IU] via SUBCUTANEOUS
  Administered 2019-02-10: 8 [IU] via SUBCUTANEOUS
  Administered 2019-02-10: 3 [IU] via SUBCUTANEOUS
  Administered 2019-02-11: 5 [IU] via SUBCUTANEOUS
  Administered 2019-02-11: 3 [IU] via SUBCUTANEOUS
  Administered 2019-02-12: 2 [IU] via SUBCUTANEOUS
  Administered 2019-02-12: 8 [IU] via SUBCUTANEOUS
  Administered 2019-02-13: 3 [IU] via SUBCUTANEOUS
  Administered 2019-02-13: 11 [IU] via SUBCUTANEOUS
  Administered 2019-02-13: 2 [IU] via SUBCUTANEOUS
  Administered 2019-02-14: 3 [IU] via SUBCUTANEOUS
  Administered 2019-02-14: 11 [IU] via SUBCUTANEOUS

## 2019-02-08 MED ORDER — INSULIN REGULAR BOLUS VIA INFUSION: 0.0000 [IU] | Freq: Three times a day (TID) | INTRAVENOUS | Status: DC

## 2019-02-08 MED ORDER — INSULIN ASPART 100 UNIT/ML ~~LOC~~ SOLN: 0.0000 [IU] | Freq: Three times a day (TID) | SUBCUTANEOUS | Status: DC

## 2019-02-08 MED ORDER — INSULIN REGULAR(HUMAN) IN NACL 100-0.9 UT/100ML-% IV SOLN: INTRAVENOUS | Status: DC

## 2019-02-08 MED ORDER — SODIUM CHLORIDE 0.9 % IV SOLN: INTRAVENOUS | Status: DC

## 2019-02-08 MED ORDER — DEXTROSE-NACL 5-0.45 % IV SOLN: INTRAVENOUS | Status: DC

## 2019-02-08 MED ORDER — DEXTROSE 50 % IV SOLN: 25.0000 mL | INTRAVENOUS | Status: DC | PRN

## 2019-02-08 MED ORDER — ENSURE MAX PROTEIN PO LIQD
11.0000 [oz_av] | Freq: Two times a day (BID) | ORAL | Status: DC
  Administered 2019-02-08 – 2019-02-14 (×13): 11 [oz_av] via ORAL
  Filled 2019-02-08 (×13): qty 330

## 2019-02-08 NOTE — Evaluation (Signed)
Clinical/Bedside Swallow Evaluation Patient Details  Name: Lee Hall MRN: [id number] Date of Birth: 11-07-1951  Today's Date: 02/08/2019 Time: SLP Start Time (ACUTE ONLY): 0950 SLP Stop Time (ACUTE ONLY): 1015 SLP Time Calculation (min) (ACUTE ONLY): 25 min  Past Medical History:  Past Medical History:  Diagnosis Date  . Anxiety   . CAD (coronary artery disease)   . Heart failure, systolic, acute on chronic ([place])   . ICD (implantable cardiac defibrillator), single, in situ   . ISCHEMIC CARDIOMYOPATHY 03/19/2009   Qualifier: Diagnosis of  By: Boyce Medici RN, BSN, [place]    . LEFT VENTRICULAR MURAL THROMBUS 02/18/2009   Qualifier: Diagnosis of  By: Stevie Kern NP-C, Sharyn Lull    . Melanoma ([place]) 2019  . MI (myocardial infarction) ([place]) 08/2003  . PAROXYSMAL VENTRICULAR TACHYCARDIA 03/19/2009   Qualifier: Diagnosis of  By: Boyce Medici, RN, BSN, [place]    . Pre-diabetes   . SYSTOLIC HEART FAILURE, ACUTE ON CHRONIC 01/02/2009   Qualifier: Diagnosis of  By: Stevie Kern NP-C, Sharyn Lull    . Ventricular tachycardia [facility])    Past Surgical History:  Past Surgical History:  Procedure Laterality Date  . ABLATION  03/2018   IN [place]  . AXILLARY LYMPH NODE DISSECTION Right 10/05/2018   RIGHT AXILLARY LYMPH NODE DISSECTION   . AXILLARY LYMPH NODE DISSECTION Right 10/05/2018   Procedure: RIGHT AXILLARY LYMPH NODE DISSECTION;  Surgeon: Stark Klein, MD;  Location: [place];  Service: General;  Laterality: Right;  . CARDIAC CATHETERIZATION     01/30/18 [facility]): [place] LAD. Patent LCX and RCA. Normal LVEDP 14 mmHg. Medical managment.  Randolm Idol / REPLACE / REMOVE PACEMAKER    . SKIN BIOPSY  2019   melanoma R shoulder  . TONSILLECTOMY AND ADENOIDECTOMY  19659   HPI:  68 year old male with history of CAD, ischemic cardiomyopathy, chronic systolic heart failure admitted 3/5 with septic shock, right upper lobe pneumonia +/- pneumonitis related to immune modulator for his melanoma (nivolaub), brief  cardiac arrest in ER.   Assessment / Plan / Recommendation Clinical Impression  RN reports full liquid diet had been started, and pt appeared to tolerate grits and thin liquids without overt difficulty. RN was observed providing whole meds with thin liquids. No overt s/s aspiration observed. Following administration of meds, pt accepted trials of thin liquid, puree, and solid consistencies without change in voice quality, cough response or change in respiratory status. Pt passed 3oz water challenge. Pt is at increased risk for dysphagia following intubation for 7 days, so SLP will continue to follow for assessment of diet tolerance and education. Recommend advancing to regular diet with thin liquids, meds whole with liquid.    SLP Visit Diagnosis: Dysphagia, unspecified (R13.10)    Aspiration Risk  Mild aspiration risk    Diet Recommendation Thin liquid;Regular   Liquid Administration via: Cup Medication Administration: Whole meds with liquid Supervision: Patient able to self feed Compensations: Slow rate;Small sips/bites;Minimize environmental distractions Postural Changes: Seated upright at 90 degrees;Remain upright for at least 30 minutes after po intake    Other  Recommendations Oral Care Recommendations: Oral care BID   Follow up Recommendations (TBD, not anticipated however)      Frequency and Duration min 1 x/week  1 week;2 weeks       Prognosis Prognosis for Safe Diet Advancement: Good      Swallow Study   General Date of Onset: 02/05/19 HPI: 68 year old male with history of CAD, ischemic cardiomyopathy, chronic systolic heart failure admitted 3/5 with septic  shock, right upper lobe pneumonia +/- pneumonitis related to immune modulator for his melanoma (nivolaub), brief cardiac arrest in ER. Type of Study: Bedside Swallow Evaluation Previous Swallow Assessment: none found Diet Prior to this Study: (full liquid) Temperature Spikes Noted: No Respiratory Status: Nasal  cannula History of Recent Intubation: Yes Length of Intubations (days): 7 days Date extubated: 02/07/19 Behavior/Cognition: Cooperative;Pleasant mood;Alert Oral Cavity Assessment: Within Functional Limits Oral Cavity - Dentition: Adequate natural dentition Vision: Functional for self-feeding Self-Feeding Abilities: Able to feed self Patient Positioning: Upright in bed Baseline Vocal Quality: Normal Volitional Cough: Strong Volitional Swallow: Able to elicit    Oral/Motor/Sensory Function Overall Oral Motor/Sensory Function: Within functional limits   Ice Chips Ice chips: Not tested   Thin Liquid Thin Liquid: Within functional limits Presentation: Cup;Self Fed    Nectar Thick Nectar Thick Liquid: Not tested   Honey Thick Honey Thick Liquid: Not tested   Puree Puree: Within functional limits Presentation: Self Fed;Spoon   Solid     Solid: Within functional limits Presentation: [place] [facility], [place] Speech Language Pathologist [phone]  Shonna Chock 02/08/2019,10:23 AM

## 2019-02-08 NOTE — Progress Notes (Signed)
Nutrition Follow-up  DOCUMENTATION CODES:   Not applicable  INTERVENTION:    Ensure Max po BID, each supplement provides 150 kcal and 30 grams of protein.   NUTRITION DIAGNOSIS:   Inadequate oral intake related to poor appetite as evidenced by per patient/family report.  Ongoing   GOAL:   Patient will meet greater than or equal to 90% of their needs  Progressing  MONITOR:   PO intake, Supplement acceptance  REASON FOR ASSESSMENT:   Ventilator, Consult Enteral/tube feeding initiation and management  ASSESSMENT:   68 yo male with PMH of ICD, HF, V tach, ICM, melanoma on immunotherapy who was admitted with septic shock, RUL PNA, cardiac arrest in ED, requiring intubation.   Extubated 3/11.   Diet has been advanced to heart healthy, CHO modified. Patient c/o poor appetite. He's eating what he can, but doesn't really like the hospital food. His sister brought in some mac & cheese and fries from [place] for lunch today. He only took a few bites.  Patient reports that he checks his blood sugar at home and it runs ~140. He says the reason his glucose was high earlier today was because they checked it right after he ate.    CBG's: [phone] today   Diet Order:   Diet Order            Diet heart healthy/carb modified Room service appropriate? Yes; Fluid consistency: Thin  Diet effective now              EDUCATION NEEDS:   No education needs have been identified at this time  Skin:  Skin Assessment: Reviewed RN Assessment  Last BM:  3/9  Height:   Ht Readings from Last 1 Encounters:  02/01/19 _0  (1.803 m)    Weight:   Wt Readings from Last 1 Encounters:  02/08/19 80.5 kg    Ideal Body Weight:  78.2 kg  BMI:  Body mass index is 24.75 kg/m.  Estimated Nutritional Needs:   Kcal:  2000-2200  Protein:  95-115 gm  Fluid:  2-2.2 L    Molli Barrows, RD, LDN, [place] Pager [phone] After Hours Pager [phone]

## 2019-02-08 NOTE — Progress Notes (Signed)
Un able to draw labs from pt's CVC. Phlebotomy called to come draw morning labs.

## 2019-02-08 NOTE — Evaluation (Signed)
Physical Therapy Evaluation Patient Details Name: Lee Hall MRN: [id number] DOB: September 08, 1951 Today's Date: 02/08/2019   History of Present Illness  68 year old male with history of CAD, ischemic cardiomyopathy, chronic systolic heart failure admitted 3/5 with septic shock, right upper lobe pneumonia +/- pneumonitis related to immune modulator for his melanoma (nivolaub), brief cardiac arrest in ER    Clinical Impression  Pt admitted with above diagnosis. Pt currently with functional limitations due to the deficits listed below (see PT Problem List). PTA pt living alone independent with all mobility. [place] patient weaker than baseline, requiring hands on assistance to ambulate short distance, VSS on RA. Slow processing, imbalance, strength, and tolerance deficits noted. Discussed role of post acute rehab, will progress OOB mobility and update recs if appropriate.  Pt will benefit from skilled PT to increase their independence and safety with mobility to allow discharge to the venue listed below.       Follow Up Recommendations CIR    Equipment Recommendations  Rolling walker with 5" wheels    Recommendations for Other Services OT consult;Rehab consult     Precautions / Restrictions Precautions Precautions: Fall;ICD/Pacemaker(watch O2 sats) Restrictions Weight Bearing Restrictions: No      Mobility  Bed Mobility               General bed mobility comments: in chair at entry  Transfers Overall transfer level: Needs assistance Equipment used: Rolling walker (2 wheeled)             General transfer comment: min guard to stand cues for hand placement  Ambulation/Gait Ambulation/Gait assistance: Min assist Gait Distance (Feet): 30 Feet Assistive device: Rolling walker (2 wheeled) Gait Pattern/deviations: Step-to pattern;Step-through pattern Gait velocity: decreased   General Gait Details: patient weak and unsteady, ambulating slow and cautious. VSS on  RA.   Stairs            Wheelchair Mobility    Modified Rankin (Stroke Patients Only)       Balance Overall balance assessment: Needs assistance   Sitting balance-Leahy Scale: Fair       Standing balance-Leahy Scale: Poor                               Pertinent Vitals/Pain Pain Assessment: No/denies pain Pain Location: No pain, but reports back discomfort Pain Descriptors / Indicators: Discomfort Pain Intervention(s): Monitored during session;Limited activity within patient's tolerance    Home Living Family/patient expects to be discharged to:: Private residence Living Arrangements: Alone Available Help at Discharge: Family;Available PRN/intermittently Type of Home: Apartment Home Access: Stairs to enter Entrance Stairs-Rails: Right;Left;Can reach both Entrance Stairs-Number of Steps: 14 Home Layout: One level Home Equipment: None      Prior Function Level of Independence: Independent         Comments: drives; independent with financial and medicaiton managment     Hand Dominance   Dominant Hand: Right    Extremity/Trunk Assessment   Upper Extremity Assessment Upper Extremity Assessment: Generalized weakness    Lower Extremity Assessment Lower Extremity Assessment: Generalized weakness(gross 3+/5)       Communication   Communication: No difficulties  Cognition Arousal/Alertness: Awake/alert Behavior During Therapy: Flat affect Overall Cognitive Status: No family/caregiver present to determine baseline cognitive functioning                                 General  Comments: slow processing; will further assess      General Comments      Exercises General Exercises - Lower Extremity Long Arc Quad: 20 reps Hip ABduction/ADduction: 10 reps Straight Leg Raises: 10 reps   Assessment/Plan    PT Assessment Patient needs continued PT services  PT Problem List Decreased strength       PT Treatment  Interventions DME instruction;Stair training;Gait training;Functional mobility training;Therapeutic activities;Therapeutic exercise;Balance training    PT Goals (Current goals can be found in the Care Plan section)  Acute Rehab PT Goals Patient Stated Goal: to get stonger PT Goal Formulation: With patient Time For Goal Achievement: 02/22/19 Potential to Achieve Goals: Good    Frequency Min 3X/week   Barriers to discharge   lives alone with family PRN    Co-evaluation               AM-PAC PT "6 Clicks" Mobility  Outcome Measure Help needed turning from your back to your side while in a flat bed without using bedrails?: A Little Help needed moving from lying on your back to sitting on the side of a flat bed without using bedrails?: A Little Help needed moving to and from a bed to a chair (including a wheelchair)?: A Little Help needed standing up from a chair using your arms (e.g., wheelchair or bedside chair)?: A Little Help needed to walk in hospital room?: A Lot Help needed climbing 3-5 steps with a railing? : Total 6 Click Score: 15    End of Session Equipment Utilized During Treatment: Gait belt Activity Tolerance: Patient tolerated treatment well Patient left: in chair;with nursing/sitter in room;with call bell/phone within reach Nurse Communication: Mobility status PT Visit Diagnosis: Unsteadiness on feet (R26.81)    Time: 0093-8182 PT Time Calculation (min) (ACUTE ONLY): 30 min   Charges:   PT Evaluation $PT Eval Moderate Complexity: 1 Mod PT Treatments $Gait Training: 8-22 mins       Reinaldo Berber, PT, DPT Acute Rehabilitation Services Pager: [phone] Office: [place] 02/08/2019, 5:03 PM

## 2019-02-08 NOTE — Progress Notes (Signed)
Inpatient Rehabilitation Admissions Coordinator  Pt has progressed with therapy. Recommend an inpt rehab consult. Please place order if pt would like to be considered for admit.  Danne Baxter, RN, MSN Rehab Admissions Coordinator [phone] 02/08/2019 8:42 PM

## 2019-02-08 NOTE — Progress Notes (Signed)
Pt arrived from 12M.  CHG bath completed.  CCMD notified tele box #01.  Oriented to room.  Call bell within reach.   Sharyn Lull, RN

## 2019-02-08 NOTE — Progress Notes (Signed)
NAME:  Lee Hall, MRN:  [id number], DOB:  [birth year], LOS: 7 ADMISSION DATE:  02/01/2019, CONSULTATION DATE:  3/5 REFERRING MD:  EDP, CHIEF COMPLAINT:  Shock, respiratory failure    Brief History   68 yo male presented with dyspnea, leg swelling, and hypotension.  Had brief cardiac arrest in ER.  Found to have septic shock from RUL PNA.  Had been on outpt ABx prior to admission.    Past Medical History  CAD, ischemic CM with systolic CHF EF 69%, VT s/p ICD/PM on amiodarone, Melanoma on immune therapy (nivolaub)  Significant Hospital Events   3/5 cardiac arrest  Consults:  Cardiology  Procedures:  ETT 3/05 >>3/8 ETT 3/9>>> Rt IJ CVL 3/05 >> Lt radial aline 3/05 >> 3/07  Significant Diagnostic Tests:  2D echo 3/5 >> EF 20 to 25%, mild/mod MR, mild/mod TR, mod AR  Micro Data:  Blood cultures X2 3/5 >> Sputum 3/5 >> Respiratory viral panel 3/05 >> negative  Antimicrobials:  Rocephin 3/5 x 1 Azithromycin 3/5>>> 3/7 Cefepime 3/5>> Vancomycin 3/5 >>  Interim history/subjective:  Tolerated extubation No events No new complaints  Objective   Blood pressure 110/73, pulse (!) 59, temperature (!) 96.6 F (35.9 C), temperature source Axillary, resp. rate 13, height 5\' 11"  (1.803 m), weight 80.5 kg, SpO2 100 %. CVP:  [6 mmHg] 6 mmHg      Intake/Output Summary (Last 24 hours) at 02/08/2019 1024 Last data filed at 02/08/2019 0600 Gross per 24 hour  Intake 477 ml  Output 3780 ml  Net -3303 ml   Filed Weights   02/06/19 0430 02/07/19 0438 02/08/19 0500  Weight: 90.9 kg 82.1 kg 80.5 kg    Examination:  General - Well appearing, NAD Eyes - PERRL, EOM-I ENT - Supple, -LAN Cardiac - RRR, Nl S1/S2 and -M/R/G Chest - Coarse BS diffusely Abdomen - Soft, NT, ND and +BS Extremities - -edema and -tenderness Skin - no rashes Neuro - follows commands, moves extremities  I reviewed CXR myself, infiltrate noted  Discussed with TRH-MD and [facility] Problem list   Lactic acidosis  Assessment & Plan:   Septic shock from pneumonia. Discussion: Was recently on ABx as outpt.  Has been on immunomodulatory therapy for melanoma. Plan - Completed 7 days of vanc/cefepime off 3/11 - D/C pressors  Acute hypoxic respiratory failure with pulmonary infiltrates. Discussion: Likely from pneumonia.  Other possibility is from immunotherapy induced pneumonitis. Plan - Titrate O2 for sat of 88-92% - Ambulate - IS - Flutter valve - Continue solumedrol to 40 mg q8h, will continue for now - PRN BDs - Hold further diureses today  Acute renal failure from ATN in setting of hypoxia, hypotension (baseline creatinine 1.07 from 01/19/19). Urine retention. Lactic acidosis. Plan - BMET in AM - D/C TLC and foley - Defer nephrology consult for now - KVO IVF - Hold further diuretics for now  Elevated LFTs likely from Shock. Plan - LFTs to PRN at this point  Cardiac arrest in setting of respiratory failure and septic shock. Hx CAD, ischemic CM with chronic systolic CHF, VT, valvular heart disease. Plan - Hold outpt amiodarone, entresto, coreg, mexitil - Cardiology following, appreciate input  DM type II with hyperglycemia. Plan - SSI - Diabetic coordinator consult - Holding home metformin, Jardiance  Acute metabolic encephalopathy. Plan - Minimize sedation - Reorient - Transfer out  Hx of T3, N3 melanoma Rt shoulder dx January 2019. Discussion: Followed by Dr. Alen Blew.  Has been on  Nivolumab since January 2020. Plan - Hold nivolumab, will start after out of the ICU  Transfer to SDU and to [facility] with PCCM off 3/13  Best practice:  Diet: NPO DVT prophylaxis: SQ Heparin GI prophylaxis: Protonix Mobility: Up to chair Code Status: Full Family Communication: no family at bedside Disposition:  ICU  Labs    CMP Latest Ref Rng & Units 02/08/2019 02/07/2019 02/07/2019  Glucose 70 - 99 mg/dL 143(H) 200(H) -  BUN 8 - 23 mg/dL  65(H) 68(H) -  Creatinine 0.61 - 1.24 mg/dL 1.89(H) 2.08(H) -  Sodium 135 - 145 mmol/L 143 146(H) 144  Potassium 3.5 - 5.1 mmol/L 3.6 3.7 3.5  Chloride 98 - 111 mmol/L 99 105 -  CO2 22 - 32 mmol/L 33(H) 30 -  Calcium 8.9 - 10.3 mg/dL 8.5(L) 8.6(L) -  Total Protein 6.5 - 8.1 g/dL - - -  Total Bilirubin 0.3 - 1.2 mg/dL - - -  Alkaline Phos 38 - 126 U/L - - -  AST 15 - 41 U/L - - -  ALT 0 - 44 U/L - - -   CBC Latest Ref Rng & Units 02/08/2019 02/07/2019 02/07/2019  WBC 4.0 - 10.5 K/uL 11.1(H) 9.5 -  Hemoglobin 13.0 - 17.0 g/dL 14.2 14.1 12.9(L)  Hematocrit 39.0 - 52.0 % 44.6 44.0 38.0(L)  Platelets 150 - 400 K/uL 40(L) 46(L) -   ABG    Component Value Date/Time   PHART 7.601 (HH) 02/07/2019 0319   PCO2ART 35.0 02/07/2019 0319   PO2ART 109.0 (H) 02/07/2019 0319   HCO3 34.5 (H) 02/07/2019 0319   TCO2 36 (H) 02/07/2019 0319   ACIDBASEDEF 5.7 (H) 02/02/2019 0333   O2SAT 99.0 02/07/2019 0319   CBG (last 3)  Recent Labs    02/07/19 2344 02/08/19 0338 02/08/19 0720  GLUCAP 99 117* 118*   Rush Farmer, M.D. [facility] Pulmonary/Critical Care Medicine. Pager: [phone]. After hours pager: [phone].

## 2019-02-08 NOTE — Progress Notes (Signed)
Progress Note  Patient Name: Lee Hall Date of Encounter: 02/08/2019  Primary Cardiologist:   Will Meredith Leeds, MD   Subjective   Extubated. 4.5L negative yesterday on lasix gtts - now -3L total since admit. BP improved into the low 381'R systolic, off pressors. Creatinine improved 2.08 -> 1.89.  He feels better today.  Inpatient Medications    Scheduled Meds: . aspirin EC  81 mg Oral Daily  . chlorhexidine gluconate (MEDLINE KIT)  15 mL Mouth Rinse BID  . docusate sodium  100 mg Oral BID  . insulin aspart  3-9 Units Subcutaneous Q4H  . mouth rinse  15 mL Mouth Rinse 10 times per day  . methylPREDNISolone (SOLU-MEDROL) injection  40 mg Intravenous Q12H  . pantoprazole (PROTONIX) IV  40 mg Intravenous Q24H  . sodium chloride flush  3 mL Intravenous Q12H   Continuous Infusions: . sodium chloride    . propofol (DIPRIVAN) infusion Stopped (02/07/19 0940)   PRN Meds: sodium chloride, bisacodyl, fentaNYL (SUBLIMAZE) injection, fentaNYL (SUBLIMAZE) injection, ipratropium-albuterol, sodium chloride flush   Vital Signs    Vitals:   02/08/19 0724 02/08/19 0800 02/08/19 0900 02/08/19 1000  BP:    100/65  Pulse:  (!) 59 (!) 59 (!) 59  Resp:  (!) _0 Temp: (!) 96.6 F (35.9 C)     TempSrc: Axillary     SpO2:  100% 99% 99%  Weight:      Height:        Intake/Output Summary (Last 24 hours) at 02/08/2019 1111 Last data filed at 02/08/2019 1000 Gross per 24 hour  Intake 512 ml  Output 4200 ml  Net -3688 ml   Filed Weights   02/06/19 0430 02/07/19 0438 02/08/19 0500  Weight: 90.9 kg 82.1 kg 80.5 kg    Telemetry    Paced rhythm in 60's- Personally Reviewed  ECG    NA - Personally Reviewed  Physical Exam   General appearance: alert and no distress Neck: JVD - 2 cm above sternal notch, no carotid bruit and thyroid not enlarged, symmetric, no tenderness/mass/nodules Lungs: diminished breath sounds bibasilar Heart: regular rate and rhythm  Abdomen: soft, non-tender; bowel sounds normal; no masses,  no organomegaly Extremities: edema trace edema Pulses: 2+ and symmetric Skin: Skin color, texture, turgor normal. No rashes or lesions Neurologic: Grossly normal Psych: Pleasant   Labs    Chemistry Recent Labs  Lab 02/01/19 2138  02/03/19 0453  02/05/19 0432  02/06/19 0125 02/07/19 0319 02/07/19 0502 02/08/19 0616  NA 138   < > 143   < > 143   < > 142 144 146* 143  K 4.6   < > 3.8   < > 4.1   < > 3.8 3.5 3.7 3.6  CL 115*   < > 108   < > 111  --  109  --  105 99  CO2 13*   < > 23   < > 26  --  20*  --  30 33*  GLUCOSE 277*   < > 228*   < > 152*  --  125*  --  200* 143*  BUN 39*   < > 54*   < > 61*  --  65*  --  68* 65*  CREATININE 2.23*   < > 2.36*   < > 1.84*  --  1.97*  --  2.08* 1.89*  CALCIUM 6.2*   < > 7.4*   < > 8.1*  --  8.2*  --  8.6* 8.5*  PROT 4.9*  --  4.9*  --  5.0*  --   --   --   --   --   ALBUMIN 2.3*  --  2.2*  --  2.5*  --   --   --   --   --   AST 6,759*  --  8,443*  --  616*  --   --   --   --   --   ALT 3,747*  --  3,419*  --  2,457*  --   --   --   --   --   ALKPHOS 162*  --  179*  --  157*  --   --   --   --   --   BILITOT 1.6*  --  1.6*  --  1.4*  --   --   --   --   --   GFRNONAA 30*   < > 28*   < > 38*  --  35*  --  32* 36*  GFRAA 35*   < > 32*   < > 44*  --  40*  --  38* 42*  ANIONGAP 10   < > 12   < > 6  --  13  --  11 11   < > = values in this interval not displayed.     Hematology Recent Labs  Lab 02/06/19 0125 02/07/19 0319 02/07/19 0502 02/08/19 0732  WBC 9.9  --  9.5 11.1*  RBC 4.73  --  4.93 5.00  HGB 13.6 12.9* 14.1 14.2  HCT 42.5 38.0* 44.0 44.6  MCV 89.9  --  89.2 89.2  MCH 28.8  --  28.6 28.4  MCHC 32.0  --  32.0 31.8  RDW 14.2  --  14.3 14.4  PLT 47*  --  46* 40*    Cardiac Enzymes Recent Labs  Lab 02/01/19 1058 02/01/19 1424 02/01/19 2138  TROPONINI 0.06* 0.19* 0.16*   No results for input(s): TROPIPOC in the last 168 hours.   BNP No results for  input(s): BNP, PROBNP in the last 168 hours.   DDimer  No results for input(s): DDIMER in the last 168 hours.   Radiology    Dg Chest Port 1 View  Result Date: 02/07/2019 CLINICAL DATA:  History of ETT. EXAM: PORTABLE CHEST 1 VIEW COMPARISON:  02/06/2019. FINDINGS: Unchanged tubes and lines. ET tube 5.5 cm above carina. Multi lead defibrillator appears stable. No change in mild RIGHT-sided airspace opacities. Retrocardiac density likely atelectasis persists. Small LEFT effusion. IMPRESSION: Stable aeration. Electronically Signed   By: Staci Righter M.D.   On: 02/07/2019 07:40   Portable Chest X-ray  Result Date: 02/06/2019 CLINICAL DATA:  Intubated patient.  Airspace disease. EXAM: PORTABLE CHEST 1 VIEW COMPARISON:  Radiographs 02/05/2019 and 02/06/2019. FINDINGS: 1607 hours. Tip of the endotracheal tube is unchanged in the mid trachea. Enteric tube projects below the diaphragm, tip not visualized. Left subclavian AICD leads appear unchanged. Multiple other tubes and lines overlie the chest. 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 The right-greater-than-left airspace opacities appear minimally improved. There are probable small bilateral pleural effusions. No pneumothorax. IMPRESSION: 1. Stable support system and lines. 2. Minimal improvement in right-greater-than-left airspace opacities. Persistent bilateral pleural effusions. Electronically Signed   By: Richardean Sale M.D.   On: 02/06/2019 16:24    Cardiac Studies   Echo 02/01/19  1. The left ventricle has severely reduced systolic function, with an ejection fraction of 20-25%. The cavity size was moderately dilated. Left ventricular diastology could not be evaluated due to nondiagnostic images. Elevated left atrial and left  ventricular end-diastolic pressures.  2. There is akinesis of the mid to apical inferior, anterior, lateral, inferolateral, apical and apical septal walls.  There is akinesis of the mid anteroseptal and inferoseptal wall.  3. The right ventricle has normal systolic function. The cavity was normal. There is no increase in right ventricular wall thickness. Right ventricular systolic pressure is moderately elevated with an estimated pressure of 55.4 mmHg.  4. Left atrial size was severely dilated.  5. The mitral valve is normal in structure. There is mild mitral annular calcification present. Mitral valve regurgitation is moderate to severe by color flow Doppler.  6. The tricuspid valve is normal in structure. Tricuspid valve regurgitation is mild-moderate.  7. The aortic valve is tricuspid Moderate thickening of the aortic valve Moderate calcification of the aortic valve. Aortic valve regurgitation is moderate by color flow Doppler.  8. The pulmonic valve was normal in structure.  9. The inferior vena cava was dilated in size with <50% respiratory variability  Patient Profile     68 y.o. male with a hx of ICM s/p ICD/PPM, VT CAD with occluded LAD/collaterals, melanoma, who is being seen for the evaluation of cardiac arrest and + troponin at the request of Dr. Nelda Marseille.  Assessment & Plan    SEPTIC SHOCK/RESPIRATORY FAILURE:  Now off pressors, extubated. BP remains in the 94'T systolic. Markedly elevated AST/ALT - improving. Now extubated - improving with diuresis. Leukocytosis improved. Creatinine improving.  ACUTE ON CHRONIC CHF:   LVEF 20-25% - diuresed well on lasix gtts. Off pressors. BP improved. Holding diuretics - monitor volume status, but will likely need to be back on diuretics.  ELEVATED TROPONIN:  Known CAD.  I suspect that this is demand ischemia.  Will not pursue invasive work-up given known underlying disease including occluded LAD with collaterals. Restart low dose aspirin.  ICD/PPM : No active issues - history of VT, but none noted currently.    For questions or updates, please contact [place] Please consult [email or web address] for  contact info under Cardiology/STEMI.   Pixie Casino, MD, [facility], [place] Director of the Advanced Lipid Disorders &  Cardiovascular Risk Reduction Clinic Diplomate of the American Board of Clinical Lipidology Attending Cardiologist  Direct Dial: [phone]  Fax: [phone]  Website:  [email or web address]  Pixie Casino, MD  02/08/2019, 11:11 AM

## 2019-02-08 NOTE — Plan of Care (Signed)
  Problem: Education: Goal: Knowledge of General Education information will improve Description Including pain rating scale, medication(s)/side effects and non-pharmacologic comfort measures Outcome: Progressing   

## 2019-02-09 LAB — CBC
HEMATOCRIT: 41.7 % (ref 39.0–52.0)
HEMOGLOBIN: 14 g/dL (ref 13.0–17.0)
MCH: 29.2 pg (ref 26.0–34.0)
MCHC: 33.6 g/dL (ref 30.0–36.0)
MCV: 86.9 fL (ref 80.0–100.0)
PLATELETS: 36 10*3/uL — AB (ref 150–400)
RBC: 4.8 MIL/uL (ref 4.22–5.81)
RDW: 13.9 % (ref 11.5–15.5)
WBC: 11.9 10*3/uL — ABNORMAL HIGH (ref 4.0–10.5)
nRBC: 0 % (ref 0.0–0.2)

## 2019-02-09 LAB — GLUCOSE, CAPILLARY
Glucose-Capillary: 248 mg/dL — ABNORMAL HIGH (ref 70–99)
Glucose-Capillary: 208 mg/dL — ABNORMAL HIGH (ref 70–99)
Glucose-Capillary: 210 mg/dL — ABNORMAL HIGH (ref 70–99)
Glucose-Capillary: 203 mg/dL — ABNORMAL HIGH (ref 70–99)

## 2019-02-09 LAB — BASIC METABOLIC PANEL
Anion gap: 9 (ref 5–15)
BUN: 64 mg/dL — ABNORMAL HIGH (ref 8–23)
CHLORIDE: 94 mmol/L — AB (ref 98–111)
CO2: 30 mmol/L (ref 22–32)
Calcium: 8.2 mg/dL — ABNORMAL LOW (ref 8.9–10.3)
Creatinine, Ser: 1.54 mg/dL — ABNORMAL HIGH (ref 0.61–1.24)
GFR calc Af Amer: 54 mL/min — ABNORMAL LOW (ref >60)
GFR calc non Af Amer: 47 mL/min — ABNORMAL LOW (ref >60)
Glucose, Bld: 243 mg/dL — ABNORMAL HIGH (ref 70–99)
POTASSIUM: 3.6 mmol/L (ref 3.5–5.1)
Sodium: 133 mmol/L — ABNORMAL LOW (ref 135–145)

## 2019-02-09 LAB — PHOSPHORUS: Phosphorus: 4.6 mg/dL (ref 2.5–4.6)

## 2019-02-09 LAB — MAGNESIUM: Magnesium: 2.4 mg/dL (ref 1.7–2.4)

## 2019-02-09 MED ORDER — FENTANYL CITRATE (PF) 100 MCG/2ML IJ SOLN: 12.5000 ug | INTRAMUSCULAR | Status: DC | PRN

## 2019-02-09 MED ORDER — METHYLPREDNISOLONE SODIUM SUCC 125 MG IJ SOLR
40.0000 mg | Freq: Two times a day (BID) | INTRAMUSCULAR | Status: AC
  Administered 2019-02-09: 40 mg via INTRAVENOUS
  Filled 2019-02-09: qty 2

## 2019-02-09 MED ORDER — SENNOSIDES-DOCUSATE SODIUM 8.6-50 MG PO TABS
1.0000 | ORAL_TABLET | Freq: Two times a day (BID) | ORAL | Status: DC
  Administered 2019-02-09 – 2019-02-11 (×4): 1 via ORAL
  Filled 2019-02-09 (×8): qty 1

## 2019-02-09 MED ORDER — INSULIN GLARGINE 100 UNIT/ML ~~LOC~~ SOLN
10.0000 [IU] | Freq: Every day | SUBCUTANEOUS | Status: DC
  Administered 2019-02-09 – 2019-02-13 (×5): 10 [IU] via SUBCUTANEOUS
  Filled 2019-02-09 (×6): qty 0.1

## 2019-02-09 MED ORDER — PREDNISONE 20 MG PO TABS
40.0000 mg | ORAL_TABLET | Freq: Two times a day (BID) | ORAL | Status: AC
  Administered 2019-02-10 – 2019-02-13 (×8): 40 mg via ORAL
  Filled 2019-02-09 (×8): qty 2

## 2019-02-09 MED ORDER — POLYETHYLENE GLYCOL 3350 17 G PO PACK
17.0000 g | PACK | Freq: Every day | ORAL | Status: DC | PRN
  Administered 2019-02-11: 17 g via ORAL
  Filled 2019-02-09 (×2): qty 1

## 2019-02-09 NOTE — Progress Notes (Signed)
Progress Note  Patient Name: Lee Hall Date of Encounter: 02/09/2019  Primary Cardiologist:   Will Meredith Leeds, MD   Subjective   Diuresed another 2.1L negative overnight - now overall negative 5.1L. Creatinine improving - 1.89 -> 1.54. Sodium declined today to 133 (from 143). Weight yesterday was 80.5 kg. Not currently on diuretics. BP remains low normal.  Inpatient Medications    Scheduled Meds: . aspirin EC  81 mg Oral Daily  . docusate sodium  100 mg Oral BID  . insulin aspart  0-15 Units Subcutaneous TID WC  . methylPREDNISolone (SOLU-MEDROL) injection  40 mg Intravenous Q12H  . pantoprazole (PROTONIX) IV  40 mg Intravenous Q24H  . Ensure Max Protein  11 oz Oral BID  . sodium chloride flush  3 mL Intravenous Q12H   Continuous Infusions: . sodium chloride     PRN Meds: sodium chloride, bisacodyl, fentaNYL (SUBLIMAZE) injection, fentaNYL (SUBLIMAZE) injection, ipratropium-albuterol, sodium chloride flush   Vital Signs    Vitals:   02/08/19 2028 02/09/19 0024 02/09/19 0440 02/09/19 0842  BP: 109/61 102/61 105/63 100/64  Pulse: (!) 58 (!) 59 60 60  Resp: 18 18 18  (!) 21  Temp: 97.6 F (36.4 C) (!) 97.4 F (36.3 C) 97.7 F (36.5 C) 97.7 F (36.5 C)  TempSrc: Oral Oral Oral Oral  SpO2: 97% 95% 93% 94%  Weight:      Height:        Intake/Output Summary (Last 24 hours) at 02/09/2019 0913 Last data filed at 02/09/2019 0846 Gross per 24 hour  Intake 130 ml  Output 2100 ml  Net -1970 ml   Filed Weights   02/06/19 0430 02/07/19 0438 02/08/19 0500  Weight: 90.9 kg 82.1 kg 80.5 kg    Telemetry    Paced rhythm in 60's- Personally Reviewed  ECG    NA - Personally Reviewed  Physical Exam   General appearance: alert and no distress Neck: no carotid bruit, no JVD and thyroid not enlarged, symmetric, no tenderness/mass/nodules Lungs: diminished breath sounds RLL and rales RLL Heart: regular rate and rhythm Abdomen: soft, non-tender; bowel  sounds normal; no masses,  no organomegaly Extremities: edema trace edema Pulses: 2+ and symmetric Skin: pale, cool extremities Neurologic: Grossly normal Psych:  Anxious  Labs    Chemistry Recent Labs  Lab 02/03/19 0453  02/05/19 0432  02/07/19 0502 02/08/19 0616 02/09/19 0258  NA 143   < > 143   < > 146* 143 133*  K 3.8   < > 4.1   < > 3.7 3.6 3.6  CL 108   < > 111   < > 105 99 94*  CO2 23   < > 26   < > 30 33* 30  GLUCOSE 228*   < > 152*   < > 200* 143* 243*  BUN 54*   < > 61*   < > 68* 65* 64*  CREATININE 2.36*   < > 1.84*   < > 2.08* 1.89* 1.54*  CALCIUM 7.4*   < > 8.1*   < > 8.6* 8.5* 8.2*  PROT 4.9*  --  5.0*  --   --   --   --   ALBUMIN 2.2*  --  2.5*  --   --   --   --   AST 8,443*  --  616*  --   --   --   --   ALT 8,101*  --  2,457*  --   --   --   --  ALKPHOS 179*  --  157*  --   --   --   --   BILITOT 1.6*  --  1.4*  --   --   --   --   GFRNONAA 28*   < > 38*   < > 32* 36* 47*  GFRAA 32*   < > 44*   < > 38* 42* 54*  ANIONGAP 12   < > 6   < > 11 11 9    < > = values in this interval not displayed.     Hematology Recent Labs  Lab 02/07/19 0502 02/08/19 0732 02/09/19 0258  WBC 9.5 11.1* 11.9*  RBC 4.93 5.00 4.80  HGB 14.1 14.2 14.0  HCT 44.0 44.6 41.7  MCV 89.2 89.2 86.9  MCH 28.6 28.4 29.2  MCHC 32.0 31.8 33.6  RDW 14.3 14.4 13.9  PLT 46* 40* 36*    Cardiac Enzymes No results for input(s): TROPONINI in the last 168 hours. No results for input(s): TROPIPOC in the last 168 hours.   BNP No results for input(s): BNP, PROBNP in the last 168 hours.   DDimer  No results for input(s): DDIMER in the last 168 hours.   Radiology    No results found.  Cardiac Studies   Echo 02/01/19  1. The left ventricle has severely reduced systolic function, with an ejection fraction of 20-25%. The cavity size was moderately dilated. Left ventricular diastology could not be evaluated due to nondiagnostic images. Elevated left atrial and left  ventricular  end-diastolic pressures.  2. There is akinesis of the mid to apical inferior, anterior, lateral, inferolateral, apical and apical septal walls. There is akinesis of the mid anteroseptal and inferoseptal wall.  3. The right ventricle has normal systolic function. The cavity was normal. There is no increase in right ventricular wall thickness. Right ventricular systolic pressure is moderately elevated with an estimated pressure of 55.4 mmHg.  4. Left atrial size was severely dilated.  5. The mitral valve is normal in structure. There is mild mitral annular calcification present. Mitral valve regurgitation is moderate to severe by color flow Doppler.  6. The tricuspid valve is normal in structure. Tricuspid valve regurgitation is mild-moderate.  7. The aortic valve is tricuspid Moderate thickening of the aortic valve Moderate calcification of the aortic valve. Aortic valve regurgitation is moderate by color flow Doppler.  8. The pulmonic valve was normal in structure.  9. The inferior vena cava was dilated in size with <50% respiratory variability  Patient Profile     68 y.o. male with a hx of ICM s/p ICD/PPM, VT CAD with occluded LAD/collaterals, melanoma, who is being seen for the evaluation of cardiac arrest and + troponin at the request of Dr. Nelda Marseille.  Assessment & Plan    SEPTIC SHOCK/RESPIRATORY FAILURE:  Resolving - BP remains soft. Unable to restart HF meds at this time. Extremities remain cool.  ACUTE ON CHRONIC CHF:   LVEF 20-25% - diuresed well on lasix gtts. Now auto-diuresing. Creatinine improving.  ELEVATED TROPONIN:  Known CAD.  I suspect that this is demand ischemia.  Will not pursue invasive work-up given known underlying disease including occluded LAD with collaterals. Restart low dose aspirin.  ICD/PPM : No active issues - history of VT, but none noted currently.     For questions or updates, please contact [place] Please consult [email or web address] for contact info under  Cardiology/STEMI.   Pixie Casino, MD, [facility], [place]  Medical Director of the Advanced Lipid Disorders &  Cardiovascular Risk Reduction Clinic Diplomate of the American Board of Clinical Lipidology Attending Cardiologist  Direct Dial: [phone]  Fax: [phone]  Website:  [email or web address]  Pixie Casino, MD  02/09/2019, 9:13 AM

## 2019-02-09 NOTE — Care Management Important Message (Signed)
Important Message  Patient Details  Name: Lee Hall MRN: [id number] Date of Birth: 1950/12/21   Medicare Important Message Given:  Yes    [name] 02/09/2019, 2:55 PM

## 2019-02-09 NOTE — Progress Notes (Addendum)
[name] TEAM 1 - Stepdown/ICU TEAM  Lee Hall  OZH:[id number] DOB: [birth year] DOA: 02/01/2019 PCP: [name], MD    Brief Narrative:  64XB who presented with dyspnea, leg swelling, and hypotension, and suffered a brief cardiac arrest in the ER.  He was found to have septic shock from a RUL PNA.    Significant Events: 3/5 admit - cardiac arrest in ED   Subjective: The patient is sitting up in bed.  He denies chest pain shortness of breath nausea or vomiting.  He is not requiring oxygen support at this time.  He has many questions about his treatment course which I have done my best answer.  There is significant edema of his legs and arms appreciable on exam.  Assessment & Plan:  Septic shock w/ acute hypoxic resp failure due to pneumonia v/s pneumonitis  has been on immunomodulatory therapy for melanoma - completed 7 days of vanc/cefepime - plan for very slow steroid taper -clinically much improved at this time  Acute renal failure - ATN  Creatinine is slowly but steadily improving -continue to follow trend  Urinary retention Foley cath to remain until pt more mobile  Transaminitis - Shock Liver  Follow-up LFTs in a.m.  Cardiac arrest due to respiratory failure and septic shock Cardiology following  CAD - elevated troponin Felt to be demand ischemia - known occluded LAD w/ collaterals   Ischemic Chronic systolic CHF - hx of VT EF 20-25% - diuretic dosing per Cardiology - has ICD/PPM  Valvular heart disease  DM2 CBG not at goal -adjust treatment and follow  Acute metabolic encephalopathy Due to above -appears to have returned to his baseline mental status at this time  Thrombocytopenia  No evidence of HIT - hold lovenox/hep - likely due to sepsis alone - follow   T3, N3 melanoma Rt shoulder dx January 2019 - followed by Dr. Alen Blew - on Nivolumab since January 2020  DVT prophylaxis: SCDs Code Status: FULL CODE Family Communication: no  family present at time of exam  Disposition Plan: Hopeful for eventual transfer to CIR  Consultants:  Cardiology   Antimicrobials:  Rocephin 3/5  Azithromycin 3/5 > 3/7 Cefepime 3/5 > Vancomycin 3/5 >  Objective: Blood pressure 100/64, pulse 60, temperature 97.7 F (36.5 C), temperature source Oral, resp. rate (!) 21, height 5\' 11"  (1.803 m), weight 80.5 kg, SpO2 94 %.  Intake/Output Summary (Last 24 hours) at 02/09/2019 1408 Last data filed at 02/09/2019 1100 Gross per 24 hour  Intake 120 ml  Output 2225 ml  Net -2105 ml   Filed Weights   02/06/19 0430 02/07/19 0438 02/08/19 0500  Weight: 90.9 kg 82.1 kg 80.5 kg    Examination: General: No acute respiratory distress Lungs: Clear to auscultation bilaterally without wheezes or crackles Cardiovascular: Regular rate and rhythm without murmur gallop or rub normal S1 and S2 Abdomen: Nontender, nondistended, soft, bowel sounds positive, no rebound, no ascites, no appreciable mass Extremities: 2+ diffuse edema of arms and legs  CBC: Recent Labs  Lab 02/07/19 0502 02/08/19 0732 02/09/19 0258  WBC 9.5 11.1* 11.9*  HGB 14.1 14.2 14.0  HCT 44.0 44.6 41.7  MCV 89.2 89.2 86.9  PLT 46* 40* 36*   Basic Metabolic Panel: Recent Labs  Lab 02/06/19 0125  02/07/19 0502 02/08/19 0616 02/09/19 0258  NA 142   < > 146* 143 133*  K 3.8   < > 3.7 3.6 3.6  CL 109  --  105 99 94*  CO2 20*  --  30 33* 30  GLUCOSE 125*  --  200* 143* 243*  BUN 65*  --  68* 65* 64*  CREATININE 1.97*  --  2.08* 1.89* 1.54*  CALCIUM 8.2*  --  8.6* 8.5* 8.2*  MG 2.5*  --  2.5*  --  2.4  PHOS 5.2*  --  5.5*  --  4.6   < > = values in this interval not displayed.   GFR: Estimated Creatinine Clearance: 50.9 mL/min (A) (by C-G formula based on SCr of 1.54 mg/dL (H)).  Liver Function Tests: Recent Labs  Lab 02/03/19 0453 02/05/19 0432  AST 8,443* 616*  ALT 6,738* 2,457*  ALKPHOS 179* 157*  BILITOT 1.6* 1.4*  PROT 4.9* 5.0*  ALBUMIN 2.2* 2.5*     HbA1C: Hgb A1c MFr Bld  Date/Time Value Ref Range Status  01/19/2019 04:38 PM 7.0 (H) <5.7 % of total Hgb Final    Comment:    For someone without known diabetes, a hemoglobin A1c value of 6.5% or greater indicates that they may have  diabetes and this should be confirmed with a follow-up  test. . For someone with known diabetes, a value <7% indicates  that their diabetes is well controlled and a value  greater than or equal to 7% indicates suboptimal  control. A1c targets should be individualized based on  duration of diabetes, age, comorbid conditions, and  other considerations. . Currently, no consensus exists regarding use of hemoglobin A1c for diagnosis of diabetes for children. .   09/28/2018 09:11 AM 7.2 (H) 4.8 - 5.6 % Final    Comment:    (NOTE) Pre diabetes:          5.7%-6.4% Diabetes:              >6.4% Glycemic control for   <7.0% adults with diabetes     CBG: Recent Labs  Lab 02/08/19 1447 02/08/19 1650 02/08/19 2150 02/09/19 0648 02/09/19 1113  GLUCAP 312* 234* 242* 248* 208*    Recent Results (from the past 240 hour(s))  Blood Culture (routine x 2)     Status: None   Collection Time: 02/01/19  6:12 AM  Result Value Ref Range Status   Specimen Description BLOOD BLOOD RIGHT HAND  Final   Special Requests   Final    BOTTLES DRAWN AEROBIC AND ANAEROBIC Blood Culture results may not be optimal due to an inadequate volume of blood received in culture bottles   Culture   Final    NO GROWTH 5 DAYS Performed at [facility], [place] [address] [place], [place] [address]    Report Status 02/06/2019 FINAL  Final  Blood Culture (routine x 2)     Status: None   Collection Time: 02/01/19  6:12 AM  Result Value Ref Range Status   Specimen Description BLOOD BLOOD LEFT FOREARM  Final   Special Requests AEROBIC BOTTLE ONLY Blood Culture adequate volume  Final   Culture   Final    NO GROWTH 5 DAYS Performed at [facility], [place] [address],  [place], [place] [address]    Report Status 02/06/2019 FINAL  Final  Respiratory Panel by PCR     Status: None   Collection Time: 02/01/19  9:07 AM  Result Value Ref Range Status   Adenovirus NOT DETECTED NOT DETECTED Final   Coronavirus 229E NOT DETECTED NOT DETECTED Final    Comment: (NOTE) The Coronavirus on the Respiratory Panel, DOES NOT test for  the novel  Coronavirus (2019 nCoV)    Coronavirus HKU1 NOT DETECTED NOT DETECTED Final   Coronavirus NL63 NOT DETECTED NOT DETECTED Final   Coronavirus OC43 NOT DETECTED NOT DETECTED Final   Metapneumovirus NOT DETECTED NOT DETECTED Final   Rhinovirus / Enterovirus NOT DETECTED NOT DETECTED Final   Influenza A NOT DETECTED NOT DETECTED Final   Influenza B NOT DETECTED NOT DETECTED Final   Parainfluenza Virus 1 NOT DETECTED NOT DETECTED Final   Parainfluenza Virus 2 NOT DETECTED NOT DETECTED Final   Parainfluenza Virus 3 NOT DETECTED NOT DETECTED Final   Parainfluenza Virus 4 NOT DETECTED NOT DETECTED Final   Respiratory Syncytial Virus NOT DETECTED NOT DETECTED Final   Bordetella pertussis NOT DETECTED NOT DETECTED Final   Chlamydophila pneumoniae NOT DETECTED NOT DETECTED Final   Mycoplasma pneumoniae NOT DETECTED NOT DETECTED Final    Comment: Performed at [facility], [place] [address] [place], [place] [address]  MRSA PCR Screening     Status: None   Collection Time: 02/01/19  9:07 AM  Result Value Ref Range Status   MRSA by PCR NEGATIVE NEGATIVE Final    Comment:        The GeneXpert MRSA Assay (FDA approved for NASAL specimens only), is one component of a comprehensive MRSA colonization surveillance program. It is not intended to diagnose MRSA infection nor to guide or monitor treatment for MRSA infections. Performed at [facility], [place] [address], [place], [place] [address]   Culture, respiratory (tracheal aspirate)     Status: None   Collection Time: 02/01/19  3:53 PM  Result Value Ref Range Status   Specimen  Description TRACHEAL ASPIRATE  Final   Special Requests NONE  Final   Gram Stain   Final    ABUNDANT WBC PRESENT,BOTH PMN AND MONONUCLEAR RARE GRAM POSITIVE COCCI RARE GRAM VARIABLE ROD    Culture   Final    RARE Consistent with normal respiratory flora. Performed at [facility], [place] [address] [place], [place] [address]    Report Status 02/05/2019 FINAL  Final     Scheduled Meds: . aspirin EC  81 mg Oral Daily  . docusate sodium  100 mg Oral BID  . insulin aspart  0-15 Units Subcutaneous TID WC  . methylPREDNISolone (SOLU-MEDROL) injection  40 mg Intravenous Q12H  . Ensure Max Protein  11 oz Oral BID  . sodium chloride flush  3 mL Intravenous Q12H     LOS: 8 days   Cherene Altes, MD [facility] Office  [phone] Pager - Text Page per Amion  If 7PM-7AM, please contact night-coverage per Amion 02/09/2019, 2:08 PM

## 2019-02-09 NOTE — Progress Notes (Signed)
  Speech Language Pathology Treatment: Dysphagia  Patient Details Name: Lee Hall MRN: [id number] DOB: 1951/02/01 Today's Date: 02/09/2019 Time: 6734-1937 SLP Time Calculation (min) (ACUTE ONLY): 10 min  Assessment / Plan / Recommendation Clinical Impression  Pt was seen for skilled dysphagia treatment this morning. Pt's vocal quality remains slightly hoarse and pt reported mild soreness during consumption of harder/dry solids. SLP educated pt regarding his ability to choose preferred POs from [facility] menu to increase comfort during meals. During skilled observation, pt utilized swallow precautions with Mod I assist from clinician. One instance of delayed throat clear (out of ~ 10 trials) noted after consumption of regular texture cookie. Recommend continue regular diet, thin liquids, and ST will follow up once more to provide education and treatment for safety and efficiency with least restrictive diet.   HPI HPI: 68 year old male with history of CAD, ischemic cardiomyopathy, chronic systolic heart failure admitted 3/5 with septic shock, right upper lobe pneumonia +/- pneumonitis related to immune modulator for his melanoma (nivolaub), brief cardiac arrest in ER.      SLP Plan  Continue with current plan of care       Recommendations  Diet recommendations: Regular;Thin liquid Liquids provided via: Cup;Straw Medication Administration: Whole meds with liquid Supervision: Patient able to self feed Compensations: Slow rate;Small sips/bites;Minimize environmental distractions                Oral Care Recommendations: Oral care BID Follow up Recommendations: None SLP Visit Diagnosis: Dysphagia, unspecified (R13.10) Plan: Continue with current plan of care       Jettie Booze, Student SLP                 Jettie Booze 02/09/2019, 8:58 AM

## 2019-02-09 NOTE — Progress Notes (Signed)
Occupational Therapy Treatment Patient Details Name: Lee Hall MRN: [id number] DOB: 1951-05-10 Today's Date: 02/09/2019    History of present illness 68 year old male with history of CAD, ischemic cardiomyopathy, chronic systolic heart failure admitted 3/5 with septic shock, right upper lobe pneumonia +/- pneumonitis related to immune modulator for his melanoma (nivolaub), brief cardiac arrest in ER. Intubated 3/10-3/11.   OT comments  Pt reports having been in chair all morning and walking in hall with NT and RW. Pt able to perform grooming with set up seated at sink. Encouraged to participate maximally in ADL to increase endurance and independence. Educated in edema management of B UEs. Continues to be an excellent rehab candidate.  Follow Up Recommendations  CIR;Supervision/Assistance - 24 hour    Equipment Recommendations  3 in 1 bedside commode    Recommendations for Other Services      Precautions / Restrictions Precautions Precautions: Fall;Other (comment)(ICD/pacemaker)       Mobility Bed Mobility Overal bed mobility: Needs Assistance Bed Mobility: Supine to Sit;Sit to Supine     Supine to sit: Min guard;HOB elevated Sit to supine: Min assist   General bed mobility comments: assist for LEs into bed, HOB up, use of rail  Transfers Overall transfer level: Needs assistance Equipment used: Rolling walker (2 wheeled) Transfers: Sit to/from Stand Sit to Stand: Min assist         General transfer comment: steadying assist, cues for hand placement    Balance Overall balance assessment: Needs assistance   Sitting balance-Leahy Scale: Fair       Standing balance-Leahy Scale: Poor                             ADL either performed or assessed with clinical judgement   ADL Overall ADL's : Needs assistance/impaired Eating/Feeding: Independent;Sitting Eating/Feeding Details (indicate cue type and reason): struggles, but is able to open  containers Grooming: Oral care;Wash/dry hands;Wash/dry face;Brushing hair;Sitting;Set up Grooming Details (indicate cue type and reason): at sink Upper Body Bathing: Minimal assistance;Sitting Upper Body Bathing Details (indicate cue type and reason): for back Lower Body Bathing: Moderate assistance;Sit to/from stand   Upper Body Dressing : Minimal assistance;Sitting   Lower Body Dressing: Moderate assistance;Sit to/from stand   Toilet Transfer: Minimal assistance;RW;Ambulation   Toileting- Clothing Manipulation and Hygiene: Moderate assistance;Sit to/from stand       Functional mobility during ADLs: Minimal assistance;Rolling walker       Vision       Perception     Praxis      Cognition Arousal/Alertness: Awake/alert Behavior During Therapy: Flat affect Overall Cognitive Status: Within Functional Limits for tasks assessed                                 General Comments: pt very pragmatic, labile at times        Exercises     Shoulder Instructions       General Comments      Pertinent Vitals/ Pain       Pain Assessment: Faces Faces Pain Scale: Hurts a little bit Pain Location: chest (from compressions) Pain Descriptors / Indicators: Sore Pain Intervention(s): Monitored during session  Home Living  Prior Functioning/Environment              Frequency  Min 2X/week        Progress Toward Goals  OT Goals(current goals can now be found in the care plan section)  Progress towards OT goals: Progressing toward goals  Acute Rehab OT Goals Patient Stated Goal: to get stonger OT Goal Formulation: With patient Time For Goal Achievement: 02/19/19 Potential to Achieve Goals: Good  Plan Discharge plan remains appropriate    Co-evaluation                 AM-PAC OT "6 Clicks" Daily Activity     Outcome Measure   Help from another person eating meals?: None Help from  another person taking care of personal grooming?: A Little Help from another person toileting, which includes using toliet, bedpan, or urinal?: A Lot Help from another person bathing (including washing, rinsing, drying)?: A Lot Help from another person to put on and taking off regular upper body clothing?: A Little Help from another person to put on and taking off regular lower body clothing?: A Lot 6 Click Score: 16    End of Session Equipment Utilized During Treatment: Rolling walker;Gait belt  OT Visit Diagnosis: Unsteadiness on feet (R26.81);Muscle weakness (generalized) (M62.81);Other symptoms and signs involving cognitive function   Activity Tolerance Patient tolerated treatment well   Patient Left in bed;with call bell/phone within reach;with SCD's reapplied   Nurse Communication Mobility status        Time: 1350-1420 OT Time Calculation (min): 30 min  Charges: OT General Charges $OT Visit: 1 Visit OT Treatments $Self Care/Home Management : 23-37 mins  Nestor Lewandowsky, OTR/L Acute Rehabilitation Services Pager: [phone] Office: [phone]   Lee Hall 02/09/2019, 2:37 PM

## 2019-02-09 NOTE — Progress Notes (Signed)
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 male being evaluated for HIT. Heparin was started 02/01/19 for DVT prophylaxis, and baseline platelets were 207. Of note, patient has been on Nivolumab since January 2020.  HIT labs were ordered on 02/07/19 when platelets dropped to 46.  Auto-populate labs:  Heparin Induced Plt Ab  Date/Time Value Ref Range Status  02/07/2019 12:43 PM 0.083 0.000 - 0.400 OD Final    Comment:    (NOTE) Performed At: [facility] [place], [place] [id number] Rush Farmer MD GF:[id number]       CALCULATE SCORE:  4Ts (see the HIT Algorithm) Score  Thrombocytopenia 2  Timing 0  Thrombosis 0  Other causes of thrombocytopenia 0  Total 2     Recommendations (A or B or C) are based on available lab results:  A. No lab results available (HIT antibody and/or SRA ordered): -N/A - Labs available  B. HIT antibody result available: -Possible or Low Risk of HIT:    -Order SRA:  No.    -Discontinue heparin/LMWH:  No.    -Initiate alternative anticoagulant:  No.    -Document heparin allergy:  No.  C. SRA result available -SRA not available  Name of MD Contacted: Dr. Thereasa Solo  Plan (Discussed with provider) Labs ordered: -HIT labs not needed Heparin allergy: -documented or updated Anticoagulation plans - no alternative anticoagulation needed. Holding heparin due to low platelets   Comments (List any alternative plans or if there are contraindications to therapy)  Hildred Laser, PharmD Clinical Pharmacist **Pharmacist phone directory can now be found on [email or web address] ([email or web address]).  Listed under [place].

## 2019-02-09 NOTE — Progress Notes (Signed)
Patient walked for about 240 ft and tolerated well. Used walker.

## 2019-02-10 LAB — CBC
HCT: 42.2 % (ref 39.0–52.0)
Hemoglobin: 14.3 g/dL (ref 13.0–17.0)
MCH: 29 pg (ref 26.0–34.0)
MCHC: 33.9 g/dL (ref 30.0–36.0)
MCV: 85.6 fL (ref 80.0–100.0)
Platelets: 35 10*3/uL — ABNORMAL LOW (ref 150–400)
RBC: 4.93 MIL/uL (ref 4.22–5.81)
RDW: 13.9 % (ref 11.5–15.5)
WBC: 11.9 10*3/uL — ABNORMAL HIGH (ref 4.0–10.5)
nRBC: 0 % (ref 0.0–0.2)

## 2019-02-10 LAB — MAGNESIUM: Magnesium: 2.4 mg/dL (ref 1.7–2.4)

## 2019-02-10 LAB — COMPREHENSIVE METABOLIC PANEL
ALT: 558 U/L — ABNORMAL HIGH (ref 0–44)
AST: 55 U/L — ABNORMAL HIGH (ref 15–41)
Albumin: 2.5 g/dL — ABNORMAL LOW (ref 3.5–5.0)
Alkaline Phosphatase: 131 U/L — ABNORMAL HIGH (ref 38–126)
Anion gap: 9 (ref 5–15)
BUN: 56 mg/dL — ABNORMAL HIGH (ref 8–23)
CHLORIDE: 96 mmol/L — AB (ref 98–111)
CO2: 26 mmol/L (ref 22–32)
Calcium: 8.1 mg/dL — ABNORMAL LOW (ref 8.9–10.3)
Creatinine, Ser: 1.61 mg/dL — ABNORMAL HIGH (ref 0.61–1.24)
GFR calc Af Amer: 51 mL/min — ABNORMAL LOW (ref >60)
GFR calc non Af Amer: 44 mL/min — ABNORMAL LOW (ref >60)
GLUCOSE: 266 mg/dL — AB (ref 70–99)
Potassium: 4.1 mmol/L (ref 3.5–5.1)
Sodium: 131 mmol/L — ABNORMAL LOW (ref 135–145)
Total Bilirubin: 1.5 mg/dL — ABNORMAL HIGH (ref 0.3–1.2)
Total Protein: 5.2 g/dL — ABNORMAL LOW (ref 6.5–8.1)

## 2019-02-10 LAB — GLUCOSE, CAPILLARY
Glucose-Capillary: 199 mg/dL — ABNORMAL HIGH (ref 70–99)
Glucose-Capillary: 199 mg/dL — ABNORMAL HIGH (ref 70–99)
Glucose-Capillary: 272 mg/dL — ABNORMAL HIGH (ref 70–99)
Glucose-Capillary: 256 mg/dL — ABNORMAL HIGH (ref 70–99)

## 2019-02-10 LAB — PHOSPHORUS: Phosphorus: 3.8 mg/dL (ref 2.5–4.6)

## 2019-02-10 MED ORDER — FUROSEMIDE 10 MG/ML IJ SOLN
40.0000 mg | Freq: Once | INTRAMUSCULAR | Status: AC
  Administered 2019-02-10: 40 mg via INTRAVENOUS
  Filled 2019-02-10: qty 4

## 2019-02-10 NOTE — Progress Notes (Signed)
TEAM 1 - Stepdown/ICU TEAM  Theodor Mustin Karney  NFA:[id number] DOB: 30-Aug-1951 DOA: 02/01/2019 PCP: [name], MD    Brief Narrative:  48NG who presented with dyspnea, leg swelling, and hypotension, and suffered a brief cardiac arrest in the ER.  He was found to have septic shock from a RUL PNA.    Significant Events: 3/5 admit - cardiac arrest in ED   Subjective: No new complaints. Continues to be bothered by anasarca. Denies cp, n/v, or abdom pain. Is motivated to get moving/get stronger.   Assessment & Plan:  Septic shock w/ acute hypoxic resp failure due to pneumonia v/s pneumonitis  has been on immuno-therapy for melanoma - completed 7 days of vanc/cefepime - plan for very slow steroid taper -clinically much improved at this time - stable on RA   Acute renal failure - ATN  Creatinine has climbed a bit this morning - Na is falling - gently diurese and follow   Urinary retention Foley cath to remain until pt more mobile  Transaminitis - Shock Liver  LFTs much improved   Cardiac arrest due to respiratory failure and septic shock Cardiology following  CAD - elevated troponin Felt to be demand ischemia - known occluded LAD w/ collaterals   Ischemic Chronic systolic CHF - hx of VT EF 20-25% - diuretic dosing again today - has ICD/PPM - net negative ~8.3L thus far   Mod/Severe mitral regurgitation Noted on TTE 02/01/19  Mod Aortic valve regurgitation  Noted on TTE 02/01/19  DM2 CBG improving - meds adjusted 3/13 - follow w/o change today   Acute metabolic encephalopathy Due to above - appears to have returned to his baseline mental status at this time  Thrombocytopenia  No evidence of HIT - holding lovenox/hep - likely due to sepsis alone - following   T3, N3 melanoma Rt shoulder dx January 2019 - followed by Dr. Alen Blew - on Nivolumab since January 2020  DVT prophylaxis: SCDs Code Status: FULL CODE Family Communication: no family present  at time of exam  Disposition Plan: Hopeful for eventual transfer to CIR  Consultants:  Cardiology   Antimicrobials:  Rocephin 3/5  Azithromycin 3/5 > 3/7 Cefepime 3/5 > 3/11 Vancomycin 3/5 > 3/11  Objective: Blood pressure 111/62, pulse 60, temperature (!) 97.5 F (36.4 C), temperature source Oral, resp. rate 16, height 5\' 11"  (1.803 m), weight 82.1 kg, SpO2 96 %.  Intake/Output Summary (Last 24 hours) at 02/10/2019 1119 Last data filed at 02/10/2019 0553 Gross per 24 hour  Intake 160 ml  Output 2450 ml  Net -2290 ml   Filed Weights   02/07/19 0438 02/08/19 0500 02/10/19 0553  Weight: 82.1 kg 80.5 kg 82.1 kg    Examination: General: No acute respiratory distress - A&O Lungs: Clear to auscultation B - no wheeze  Cardiovascular: RRR - no M  Abdomen: Nontender, nondistended, soft, bowel sounds positive Extremities: 2+ diffuse edema of arms and legs / anasarca   CBC: Recent Labs  Lab 02/08/19 0732 02/09/19 0258 02/10/19 0331  WBC 11.1* 11.9* 11.9*  HGB 14.2 14.0 14.3  HCT 44.6 41.7 42.2  MCV 89.2 86.9 85.6  PLT 40* 36* 35*   Basic Metabolic Panel: Recent Labs  Lab 02/07/19 0502 02/08/19 0616 02/09/19 0258 02/10/19 0331  NA 146* 143 133* 131*  K 3.7 3.6 3.6 4.1  CL 105 99 94* 96*  CO2 30 33* 30 26  GLUCOSE 200* 143* 243* 266*  BUN 68* 65* 64* 56*  CREATININE 2.08* 1.89* 1.54* 1.61*  CALCIUM 8.6* 8.5* 8.2* 8.1*  MG 2.5*  --  2.4 2.4  PHOS 5.5*  --  4.6 3.8   GFR: Estimated Creatinine Clearance: 48.7 mL/min (A) (by C-G formula based on SCr of 1.61 mg/dL (H)).  Liver Function Tests: Recent Labs  Lab 02/05/19 0432 02/10/19 0331  AST 616* 55*  ALT 2,457* 558*  ALKPHOS 157* 131*  BILITOT 1.4* 1.5*  PROT 5.0* 5.2*  ALBUMIN 2.5* 2.5*    HbA1C: Hgb A1c MFr Bld  Date/Time Value Ref Range Status  01/19/2019 04:38 PM 7.0 (H) <5.7 % of total Hgb Final    Comment:    For someone without known diabetes, a hemoglobin A1c value of 6.5% or greater  indicates that they may have  diabetes and this should be confirmed with a follow-up  test. . For someone with known diabetes, a value <7% indicates  that their diabetes is well controlled and a value  greater than or equal to 7% indicates suboptimal  control. A1c targets should be individualized based on  duration of diabetes, age, comorbid conditions, and  other considerations. . Currently, no consensus exists regarding use of hemoglobin A1c for diagnosis of diabetes for children. .   09/28/2018 09:11 AM 7.2 (H) 4.8 - 5.6 % Final    Comment:    (NOTE) Pre diabetes:          5.7%-6.4% Diabetes:              >6.4% Glycemic control for   <7.0% adults with diabetes     CBG: Recent Labs  Lab 02/09/19 0648 02/09/19 1113 02/09/19 1557 02/09/19 2102 02/10/19 0616  GLUCAP 248* 208* 210* 203* 199*    Recent Results (from the past 240 hour(s))  Blood Culture (routine x 2)     Status: None   Collection Time: 02/01/19  6:12 AM  Result Value Ref Range Status   Specimen Description BLOOD BLOOD RIGHT HAND  Final   Special Requests   Final    BOTTLES DRAWN AEROBIC AND ANAEROBIC Blood Culture results may not be optimal due to an inadequate volume of blood received in culture bottles   Culture   Final    NO GROWTH 5 DAYS Performed at [facility], [place] [address], [place], [place] [address]    Report Status 02/06/2019 FINAL  Final  Blood Culture (routine x 2)     Status: None   Collection Time: 02/01/19  6:12 AM  Result Value Ref Range Status   Specimen Description BLOOD BLOOD LEFT FOREARM  Final   Special Requests AEROBIC BOTTLE ONLY Blood Culture adequate volume  Final   Culture   Final    NO GROWTH 5 DAYS Performed at [facility], [place] [address], [place], [place] [address]    Report Status 02/06/2019 FINAL  Final  Respiratory Panel by PCR     Status: None   Collection Time: 02/01/19  9:07 AM  Result Value Ref Range Status   Adenovirus NOT DETECTED NOT  DETECTED Final   Coronavirus 229E NOT DETECTED NOT DETECTED Final    Comment: (NOTE) The Coronavirus on the Respiratory Panel, DOES NOT test for the novel  Coronavirus (2019 nCoV)    Coronavirus HKU1 NOT DETECTED NOT DETECTED Final   Coronavirus NL63 NOT DETECTED NOT DETECTED Final   Coronavirus OC43 NOT DETECTED NOT DETECTED Final   Metapneumovirus NOT DETECTED NOT DETECTED Final   Rhinovirus / Enterovirus NOT DETECTED NOT  DETECTED Final   Influenza A NOT DETECTED NOT DETECTED Final   Influenza B NOT DETECTED NOT DETECTED Final   Parainfluenza Virus 1 NOT DETECTED NOT DETECTED Final   Parainfluenza Virus 2 NOT DETECTED NOT DETECTED Final   Parainfluenza Virus 3 NOT DETECTED NOT DETECTED Final   Parainfluenza Virus 4 NOT DETECTED NOT DETECTED Final   Respiratory Syncytial Virus NOT DETECTED NOT DETECTED Final   Bordetella pertussis NOT DETECTED NOT DETECTED Final   Chlamydophila pneumoniae NOT DETECTED NOT DETECTED Final   Mycoplasma pneumoniae NOT DETECTED NOT DETECTED Final    Comment: Performed at [facility], [place] [address] [place], [place] [address]  MRSA PCR Screening     Status: None   Collection Time: 02/01/19  9:07 AM  Result Value Ref Range Status   MRSA by PCR NEGATIVE NEGATIVE Final    Comment:        The GeneXpert MRSA Assay (FDA approved for NASAL specimens only), is one component of a comprehensive MRSA colonization surveillance program. It is not intended to diagnose MRSA infection nor to guide or monitor treatment for MRSA infections. Performed at [facility], [place] [address], [place], [place] [address]   Culture, respiratory (tracheal aspirate)     Status: None   Collection Time: 02/01/19  3:53 PM  Result Value Ref Range Status   Specimen Description TRACHEAL ASPIRATE  Final   Special Requests NONE  Final   Gram Stain   Final    ABUNDANT WBC PRESENT,BOTH PMN AND MONONUCLEAR RARE GRAM POSITIVE COCCI RARE GRAM VARIABLE ROD    Culture    Final    RARE Consistent with normal respiratory flora. Performed at [facility], [place] [address], [place], [place] [address]    Report Status 02/05/2019 FINAL  Final     Scheduled Meds: . aspirin EC  81 mg Oral Daily  . insulin aspart  0-15 Units Subcutaneous TID WC  . insulin glargine  10 Units Subcutaneous QHS  . predniSONE  40 mg Oral BID WC  . Ensure Max Protein  11 oz Oral BID  . senna-docusate  1 tablet Oral BID  . sodium chloride flush  3 mL Intravenous Q12H     LOS: 9 days   Cherene Altes, MD [facility] Office  [phone] Pager - Text Page per Amion  If 7PM-7AM, please contact night-coverage per Amion 02/10/2019, 11:19 AM

## 2019-02-11 LAB — CBC
HCT: 46.3 % (ref 39.0–52.0)
Hemoglobin: 15.4 g/dL (ref 13.0–17.0)
MCH: 29.1 pg (ref 26.0–34.0)
MCHC: 33.3 g/dL (ref 30.0–36.0)
MCV: 87.4 fL (ref 80.0–100.0)
Platelets: 40 K/uL — ABNORMAL LOW (ref 150–400)
RBC: 5.3 MIL/uL (ref 4.22–5.81)
RDW: 14.2 % (ref 11.5–15.5)
WBC: 12.7 K/uL — ABNORMAL HIGH (ref 4.0–10.5)
nRBC: 0 % (ref 0.0–0.2)

## 2019-02-11 LAB — GLUCOSE, CAPILLARY
GLUCOSE-CAPILLARY: 250 mg/dL — AB (ref 70–99)
GLUCOSE-CAPILLARY: 190 mg/dL — AB (ref 70–99)
Glucose-Capillary: 120 mg/dL — ABNORMAL HIGH (ref 70–99)
Glucose-Capillary: 295 mg/dL — ABNORMAL HIGH (ref 70–99)

## 2019-02-11 LAB — RENAL FUNCTION PANEL
Albumin: 2.7 g/dL — ABNORMAL LOW (ref 3.5–5.0)
Anion gap: 10 (ref 5–15)
BUN: 52 mg/dL — ABNORMAL HIGH (ref 8–23)
CO2: 25 mmol/L (ref 22–32)
Calcium: 8.4 mg/dL — ABNORMAL LOW (ref 8.9–10.3)
Chloride: 98 mmol/L (ref 98–111)
Creatinine, Ser: 1.36 mg/dL — ABNORMAL HIGH (ref 0.61–1.24)
GFR calc Af Amer: >60 mL/min (ref >60)
GFR calc non Af Amer: 54 mL/min — ABNORMAL LOW (ref >60)
GLUCOSE: 221 mg/dL — AB (ref 70–99)
PHOSPHORUS: 3.7 mg/dL (ref 2.5–4.6)
Potassium: 4.1 mmol/L (ref 3.5–5.1)
Sodium: 133 mmol/L — ABNORMAL LOW (ref 135–145)

## 2019-02-11 MED ORDER — ALBUTEROL SULFATE (2.5 MG/3ML) 0.083% IN NEBU: 2.5000 mg | INHALATION_SOLUTION | RESPIRATORY_TRACT | Status: DC | PRN

## 2019-02-11 MED ORDER — TRAMADOL HCL 50 MG PO TABS: 50.0000 mg | ORAL_TABLET | Freq: Four times a day (QID) | ORAL | Status: DC | PRN

## 2019-02-11 MED ORDER — FUROSEMIDE 10 MG/ML IJ SOLN
40.0000 mg | Freq: Every day | INTRAMUSCULAR | Status: DC
  Administered 2019-02-11 – 2019-02-13 (×3): 40 mg via INTRAVENOUS
  Filled 2019-02-11 (×3): qty 4

## 2019-02-11 MED ORDER — OXYCODONE HCL 5 MG PO TABS: 5.0000 mg | ORAL_TABLET | ORAL | Status: DC | PRN

## 2019-02-11 MED ORDER — ACETAMINOPHEN 325 MG PO TABS: 650.0000 mg | ORAL_TABLET | Freq: Four times a day (QID) | ORAL | Status: DC | PRN

## 2019-02-11 MED ORDER — ZOLPIDEM TARTRATE 5 MG PO TABS
5.0000 mg | ORAL_TABLET | Freq: Every day | ORAL | Status: DC
  Administered 2019-02-11 – 2019-02-13 (×3): 5 mg via ORAL
  Filled 2019-02-11 (×3): qty 1

## 2019-02-11 NOTE — Progress Notes (Signed)
[place] TEAM 1 - Stepdown/ICU TEAM  TRUE Garciamartinez Buechele  QPY:[id number] DOB: Apr 06, 1951 DOA: 02/01/2019 PCP: [name], MD    Brief Narrative:  14WP who presented with dyspnea, leg swelling, and hypotension, and suffered a brief cardiac arrest in the ER.  He was found to have septic shock from a RUL PNA.    Significant Events: 3/5 admit - cardiac arrest in ED   Subjective: Appears dramatically improved today.  Continues to have anasarca.  Denies shortness of breath chest pain nausea or vomiting.  Reports strong motivation to participate in rehab.  Foley catheter remains in place due to problems with urinary retention previously.  Assessment & Plan:  Septic shock w/ acute hypoxic resp failure due to pneumonia v/s pneumonitis  has been on immuno-therapy for melanoma - completed 7 days of vanc/cefepime - plan for very slow steroid taper -clinically much improved at this time - stable on RA   Acute renal failure - ATN  Creatinine responded favorably to diuresis - cont same and increase dose gently  Urinary retention Attempt to remove Foley catheter today -watch for recurrent retention -if retention recurs catheter will be replaced and a course of Urecholine will be initiated  Transaminitis - Shock Liver  LFTs much improved   Cardiac arrest due to respiratory failure and septic shock Cardiology has evaluated  CAD - elevated troponin Felt to be demand ischemia - known occluded LAD w/ collaterals   Ischemic Chronic systolic CHF - hx of VT EF 20-25% - diuresis to continue - has ICD/PPM - net negative ~11.5L thus far   [employer]   02/08/19 0500 02/10/19 0553 02/11/19 0629  Weight: 80.5 kg 82.1 kg 81 kg     Mod/Severe mitral regurgitation Noted on TTE 02/01/19  Mod Aortic valve regurgitation  Noted on TTE 02/01/19  DM2 CBG improving - meds adjusted 3/13 - follow w/o change again today   Acute metabolic encephalopathy Due to above - appears to have  returned to his baseline mental status at this time  Thrombocytopenia  No evidence of HIT - holding lovenox/hep - likely due to sepsis alone - PLT count may be improving at this time   T3, N3 melanoma Rt shoulder dx January 2019 - followed by Dr. Alen Blew - on Nivolumab since January 2020  DVT prophylaxis: SCDs Code Status: FULL CODE Family Communication: no family present at time of exam  Disposition Plan: Hopeful for eventual transfer to CIR -medically appears to be stable for this at this time as diuresis can continue on the rehab unit  Consultants:  Cardiology   Antimicrobials:  Rocephin 3/5  Azithromycin 3/5 > 3/7 Cefepime 3/5 > 3/11 Vancomycin 3/5 > 3/11  Objective: Blood pressure (!) 104/56, pulse 61, temperature 97.6 F (36.4 C), temperature source Oral, resp. rate 19, height 5\' 11"  (1.803 m), weight 81 kg, SpO2 90 %.  Intake/Output Summary (Last 24 hours) at 02/11/2019 1450 Last data filed at 02/11/2019 1300 Gross per 24 hour  Intake 840 ml  Output 4300 ml  Net -3460 ml   Filed Weights   02/08/19 0500 02/10/19 0553 02/11/19 0629  Weight: 80.5 kg 82.1 kg 81 kg    Examination: General: No acute respiratory distress  Lungs: Clear to auscultation B w/o wheezing  Cardiovascular: RRR w/o M or rub  Abdomen: NT/ND, soft, bowel sounds positive Extremities: 2+ diffuse edema of arms and legs / anasarca w/o signif change   CBC: Recent Labs  Lab 02/09/19 0258 02/10/19 0331 02/11/19 0339  WBC 11.9* 11.9* 12.7*  HGB 14.0 14.3 15.4  HCT 41.7 42.2 46.3  MCV 86.9 85.6 87.4  PLT 36* 35* 40*   Basic Metabolic Panel: Recent Labs  Lab 02/07/19 0502  02/09/19 0258 02/10/19 0331 02/11/19 0339  NA 146*   < > 133* 131* 133*  K 3.7   < > 3.6 4.1 4.1  CL 105   < > 94* 96* 98  CO2 30   < > 30 26 25   GLUCOSE 200*   < > 243* 266* 221*  BUN 68*   < > 64* 56* 52*  CREATININE 2.08*   < > 1.54* 1.61* 1.36*  CALCIUM 8.6*   < > 8.2* 8.1* 8.4*  MG 2.5*  --  2.4 2.4  --   PHOS  5.5*  --  4.6 3.8 3.7   < > = values in this interval not displayed.   GFR: Estimated Creatinine Clearance: 57.7 mL/min (A) (by C-G formula based on SCr of 1.36 mg/dL (H)).  Liver Function Tests: Recent Labs  Lab 02/05/19 0432 02/10/19 0331 02/11/19 0339  AST 616* 55*  --   ALT 2,457* 558*  --   ALKPHOS 157* 131*  --   BILITOT 1.4* 1.5*  --   PROT 5.0* 5.2*  --   ALBUMIN 2.5* 2.5* 2.7*    HbA1C: Hgb A1c MFr Bld  Date/Time Value Ref Range Status  01/19/2019 04:38 PM 7.0 (H) <5.7 % of total Hgb Final    Comment:    For someone without known diabetes, a hemoglobin A1c value of 6.5% or greater indicates that they may have  diabetes and this should be confirmed with a follow-up  test. . For someone with known diabetes, a value <7% indicates  that their diabetes is well controlled and a value  greater than or equal to 7% indicates suboptimal  control. A1c targets should be individualized based on  duration of diabetes, age, comorbid conditions, and  other considerations. . Currently, no consensus exists regarding use of hemoglobin A1c for diagnosis of diabetes for children. .   09/28/2018 09:11 AM 7.2 (H) 4.8 - 5.6 % Final    Comment:    (NOTE) Pre diabetes:          5.7%-6.4% Diabetes:              >6.4% Glycemic control for   <7.0% adults with diabetes     CBG: Recent Labs  Lab 02/10/19 1158 02/10/19 1620 02/10/19 2110 02/11/19 0615 02/11/19 1113  GLUCAP 199* 272* 256* 250* 120*    Recent Results (from the past 240 hour(s))  Culture, respiratory (tracheal aspirate)     Status: None   Collection Time: 02/01/19  3:53 PM  Result Value Ref Range Status   Specimen Description TRACHEAL ASPIRATE  Final   Special Requests NONE  Final   Gram Stain   Final    ABUNDANT WBC PRESENT,BOTH PMN AND MONONUCLEAR RARE GRAM POSITIVE COCCI RARE GRAM VARIABLE ROD    Culture   Final    RARE Consistent with normal respiratory flora. Performed at [facility],  [place] [address] [place], [place] [address]    Report Status 02/05/2019 FINAL  Final     Scheduled Meds: . aspirin EC  81 mg Oral Daily  . insulin aspart  0-15 Units Subcutaneous TID WC  . insulin glargine  10 Units Subcutaneous QHS  . predniSONE  40 mg Oral BID WC  . Ensure Max Protein  11  oz Oral BID  . senna-docusate  1 tablet Oral BID  . sodium chloride flush  3 mL Intravenous Q12H     LOS: 10 days   Cherene Altes, MD [facility] Office  [phone] Pager - Text Page per Amion  If 7PM-7AM, please contact night-coverage per Amion 02/11/2019, 2:50 PM

## 2019-02-11 NOTE — Progress Notes (Signed)
Patient walked for about 360 ft and tolerated it well with a walker.

## 2019-02-12 DIAGNOSIS — T380X5A Adverse effect of glucocorticoids and synthetic analogues, initial encounter: Secondary | ICD-10-CM

## 2019-02-12 DIAGNOSIS — I4891 Unspecified atrial fibrillation: Secondary | ICD-10-CM

## 2019-02-12 DIAGNOSIS — Z8679 Personal history of other diseases of the circulatory system: Secondary | ICD-10-CM

## 2019-02-12 DIAGNOSIS — Z950 Presence of cardiac pacemaker: Secondary | ICD-10-CM

## 2019-02-12 DIAGNOSIS — R739 Hyperglycemia, unspecified: Secondary | ICD-10-CM

## 2019-02-12 DIAGNOSIS — J181 Lobar pneumonia, unspecified organism: Secondary | ICD-10-CM

## 2019-02-12 DIAGNOSIS — I5022 Chronic systolic (congestive) heart failure: Secondary | ICD-10-CM

## 2019-02-12 DIAGNOSIS — I2510 Atherosclerotic heart disease of native coronary artery without angina pectoris: Secondary | ICD-10-CM

## 2019-02-12 DIAGNOSIS — C439 Malignant melanoma of skin, unspecified: Secondary | ICD-10-CM

## 2019-02-12 LAB — CBC
HCT: 44.2 % (ref 39.0–52.0)
Hemoglobin: 14.4 g/dL (ref 13.0–17.0)
MCH: 28.5 pg (ref 26.0–34.0)
MCHC: 32.6 g/dL (ref 30.0–36.0)
MCV: 87.4 fL (ref 80.0–100.0)
PLATELETS: 54 10*3/uL — AB (ref 150–400)
RBC: 5.06 MIL/uL (ref 4.22–5.81)
RDW: 14.2 % (ref 11.5–15.5)
WBC: 9.7 10*3/uL (ref 4.0–10.5)
nRBC: 0 % (ref 0.0–0.2)

## 2019-02-12 LAB — GLUCOSE, CAPILLARY
GLUCOSE-CAPILLARY: 150 mg/dL — AB (ref 70–99)
Glucose-Capillary: 130 mg/dL — ABNORMAL HIGH (ref 70–99)
Glucose-Capillary: 113 mg/dL — ABNORMAL HIGH (ref 70–99)
Glucose-Capillary: 290 mg/dL — ABNORMAL HIGH (ref 70–99)
Glucose-Capillary: 218 mg/dL — ABNORMAL HIGH (ref 70–99)

## 2019-02-12 LAB — BASIC METABOLIC PANEL
ANION GAP: 10 (ref 5–15)
BUN: 48 mg/dL — ABNORMAL HIGH (ref 8–23)
CO2: 24 mmol/L (ref 22–32)
Calcium: 8.4 mg/dL — ABNORMAL LOW (ref 8.9–10.3)
Chloride: 100 mmol/L (ref 98–111)
Creatinine, Ser: 1.2 mg/dL (ref 0.61–1.24)
GFR calc Af Amer: >60 mL/min (ref >60)
GFR calc non Af Amer: >60 mL/min (ref >60)
Glucose, Bld: 212 mg/dL — ABNORMAL HIGH (ref 70–99)
Potassium: 4.1 mmol/L (ref 3.5–5.1)
Sodium: 134 mmol/L — ABNORMAL LOW (ref 135–145)

## 2019-02-12 LAB — MAGNESIUM: Magnesium: 2.2 mg/dL (ref 1.7–2.4)

## 2019-02-12 MED ORDER — IPRATROPIUM BROMIDE 0.06 % NA SOLN
2.0000 | Freq: Three times a day (TID) | NASAL | Status: DC
  Administered 2019-02-12 – 2019-02-14 (×6): 2 via NASAL
  Filled 2019-02-12: qty 15

## 2019-02-12 MED ORDER — CARVEDILOL 3.125 MG PO TABS
3.1250 mg | ORAL_TABLET | Freq: Two times a day (BID) | ORAL | Status: DC
  Administered 2019-02-12 – 2019-02-14 (×4): 3.125 mg via ORAL
  Filled 2019-02-12 (×4): qty 1

## 2019-02-12 NOTE — Progress Notes (Signed)
Inpatient Rehabilitation Admissions Coordinator  I met with patient and his sister at bedside. We discussed an inpt rehab admit goals and expectations . Pt does not want SNF. I will begin insurance authorization with [facility] for a possible inpt rehab admit pending insurance. I will follow up tomorrow.  Danne Baxter, RN, MSN Rehab Admissions Coordinator [phone] 02/12/2019 1:17 PM

## 2019-02-12 NOTE — Progress Notes (Signed)
[place] TEAM 1 - Stepdown/ICU TEAM  Lee Hall  ZOX:[id number] DOB: 01/15/1951 DOA: 02/01/2019 PCP: [name], MD    Brief Narrative:  83XB who presented with dyspnea, leg swelling, and hypotension, and suffered a brief cardiac arrest in the ER.  He was found to have septic shock from a RUL PNA.    Significant Events: 3/5 admit - cardiac arrest in ED   Subjective: Sitting up in chair. Highly motivated to participate in rehab. Feels weak in general but very motivated to return home. Appears to be ideal candidate for CIR. Denies cp, n/v, abdom pain. Edema slowly improving.   Assessment & Plan:  Septic shock w/ acute hypoxic resp failure due to pneumonia v/s pneumonitis  has been on immuno-therapy for melanoma - completed 7 days of vanc/cefepime - plan for very slow steroid taper - clinically much improved at this time - stable on RA - medically ready for CIR   Acute renal failure - ATN  Creatinine responding favorably to diuresis - cont same   Urinary retention Resolved - has done well last 24hrs post foley removal   Transaminitis - Shock Liver  LFTs have been improving - f/u in AM   Cardiac arrest due to respiratory failure and septic shock Cardiology has evaluated  CAD - elevated troponin Felt to be demand ischemia - known occluded LAD w/ collaterals   Ischemic Chronic systolic CHF - hx of VT EF 20-25% - diuresis to continue - has ICD/PPM - net negative ~15L thus far - HF meds being adjusted by Cards   Filed Weights   02/10/19 0553 02/11/19 0629 02/12/19 0412  Weight: 82.1 kg 81 kg 78.1 kg    Mod/Severe mitral regurgitation Noted on TTE 02/01/19  Mod Aortic valve regurgitation  Noted on TTE 02/01/19  DM2 CBG improving - meds adjusted 3/13 - follow w/o change   Acute metabolic encephalopathy Due to above - resolved   Thrombocytopenia  No evidence of HIT - holding lovenox/hep - likely due to sepsis alone - PLT count slowly climbing   T3,  N3 melanoma Rt shoulder dx January 2019 - followed by Dr. Alen Blew - on Nivolumab since January 2020  DVT prophylaxis: SCDs Code Status: FULL CODE Family Communication: no family present at time of exam  Disposition Plan: medically ready for CIR if his insurance will allow it (cleary what is best for him medically)  Consultants:  Cardiology   Antimicrobials:  Rocephin 3/5  Azithromycin 3/5 > 3/7 Cefepime 3/5 > 3/11 Vancomycin 3/5 > 3/11  Objective: Blood pressure 97/62, pulse 60, temperature (!) 96.3 F (35.7 C), temperature source Axillary, resp. rate 20, height 5\' 11"  (1.803 m), weight 78.1 kg, SpO2 100 %.  Intake/Output Summary (Last 24 hours) at 02/12/2019 1509 Last data filed at 02/12/2019 1202 Gross per 24 hour  Intake 240 ml  Output 4235 ml  Net -3995 ml   Filed Weights   02/10/19 0553 02/11/19 0629 02/12/19 0412  Weight: 82.1 kg 81 kg 78.1 kg    Examination: General: No acute respiratory distress - A&O Lungs: Clear to auscultation B - no wheezing  Cardiovascular: RRR  Abdomen: NT/ND, soft, bowel sounds positive Extremities: 2+ diffuse edema of arms and legs / anasarca w/ modest improvement on exam   CBC: Recent Labs  Lab 02/10/19 0331 02/11/19 0339 02/12/19 0242  WBC 11.9* 12.7* 9.7  HGB 14.3 15.4 14.4  HCT 42.2 46.3 44.2  MCV 85.6 87.4 87.4  PLT 35* 40* 54*  Basic Metabolic Panel: Recent Labs  Lab 02/09/19 0258 02/10/19 0331 02/11/19 0339 02/12/19 0242  NA 133* 131* 133* 134*  K 3.6 4.1 4.1 4.1  CL 94* 96* 98 100  CO2 30 26 25 24   GLUCOSE 243* 266* 221* 212*  BUN 64* 56* 52* 48*  CREATININE 1.54* 1.61* 1.36* 1.20  CALCIUM 8.2* 8.1* 8.4* 8.4*  MG 2.4 2.4  --  2.2  PHOS 4.6 3.8 3.7  --    GFR: Estimated Creatinine Clearance: 65.4 mL/min (by C-G formula based on SCr of 1.2 mg/dL).  Liver Function Tests: Recent Labs  Lab 02/10/19 0331 02/11/19 0339  AST 55*  --   ALT 558*  --   ALKPHOS 131*  --   BILITOT 1.5*  --   PROT 5.2*  --    ALBUMIN 2.5* 2.7*    HbA1C: Hgb A1c MFr Bld  Date/Time Value Ref Range Status  01/19/2019 04:38 PM 7.0 (H) <5.7 % of total Hgb Final    Comment:    For someone without known diabetes, a hemoglobin A1c value of 6.5% or greater indicates that they may have  diabetes and this should be confirmed with a follow-up  test. . For someone with known diabetes, a value <7% indicates  that their diabetes is well controlled and a value  greater than or equal to 7% indicates suboptimal  control. A1c targets should be individualized based on  duration of diabetes, age, comorbid conditions, and  other considerations. . Currently, no consensus exists regarding use of hemoglobin A1c for diagnosis of diabetes for children. .   09/28/2018 09:11 AM 7.2 (H) 4.8 - 5.6 % Final    Comment:    (NOTE) Pre diabetes:          5.7%-6.4% Diabetes:              >6.4% Glycemic control for   <7.0% adults with diabetes     CBG: Recent Labs  Lab 02/11/19 1603 02/11/19 2129 02/12/19 0631 02/12/19 0813 02/12/19 1141  GLUCAP 190* 295* 130* 113* 150*     Scheduled Meds: . aspirin EC  81 mg Oral Daily  . carvedilol  3.125 mg Oral BID WC  . furosemide  40 mg Intravenous Daily  . insulin aspart  0-15 Units Subcutaneous TID WC  . insulin glargine  10 Units Subcutaneous QHS  . ipratropium  2 spray Each Nare TID  . predniSONE  40 mg Oral BID WC  . Ensure Max Protein  11 oz Oral BID  . senna-docusate  1 tablet Oral BID  . sodium chloride flush  3 mL Intravenous Q12H  . zolpidem  5 mg Oral QHS     LOS: 11 days   Cherene Altes, MD [facility] Office  [phone] Pager - Text Page per [name]  If 7PM-7AM, please contact night-coverage per Amion 02/12/2019, 3:09 PM

## 2019-02-12 NOTE — Progress Notes (Signed)
Occupational Therapy Treatment Patient Details Name: Solly Derasmo MRN: [id number] DOB: 02/06/1951 Today's Date: 02/12/2019    History of present illness 68 year old male with history of CAD, ischemic cardiomyopathy, chronic systolic heart failure admitted 3/5 with septic shock, right upper lobe pneumonia +/- pneumonitis related to immune modulator for his melanoma (nivolaub), brief cardiac arrest in ER. Intubated 3/10-3/11.   OT comments  Pt progressing well. Performed standing grooming with supervision and dressing with set up. Ambulated in hall with RW and min guard assist.   Follow Up Recommendations  CIR;Supervision/Assistance - 24 hour    Equipment Recommendations  3 in 1 bedside commode    Recommendations for Other Services      Precautions / Restrictions Precautions Precautions: Fall;Other (comment)(icd/pacemaker)       Mobility Bed Mobility Overal bed mobility: Modified Independent             General bed mobility comments: HOB up, no difficulty  Transfers   Equipment used: Rolling walker (2 wheeled) Transfers: Sit to/from Stand Sit to Stand: Supervision         General transfer comment: cues for hand placement, no assist    Balance Overall balance assessment: Needs assistance   Sitting balance-Leahy Scale: Good Sitting balance - Comments: no LOB with donning socks     Standing balance-Leahy Scale: Fair Standing balance comment: can release walker in static standing at sink without LOB                           ADL either performed or assessed with clinical judgement   ADL Overall ADL's : Needs assistance/impaired     Grooming: Wash/dry face;Wash/dry hands;Standing;Supervision/safety Grooming Details (indicate cue type and reason): at sink         Upper Body Dressing : Set up;Sitting Upper Body Dressing Details (indicate cue type and reason): front opening gown Lower Body Dressing: Set up;Sitting/lateral leans Lower  Body Dressing Details (indicate cue type and reason): socks   Toilet Transfer Details (indicate cue type and reason): pt reports he has been taking himself to the bathroom with RW         Functional mobility during ADLs: Min guard;Rolling walker(likely supervision, but insists on min guard)       Vision       Perception     Praxis      Cognition Arousal/Alertness: Awake/alert Behavior During Therapy: Flat affect Overall Cognitive Status: Within Functional Limits for tasks assessed                                 General Comments: pt very pragmatic        Exercises     Shoulder Instructions       General Comments      Pertinent Vitals/ Pain       Pain Assessment: Faces Faces Pain Scale: Hurts a little bit Pain Location: chest (from compressions) Pain Descriptors / Indicators: Sore Pain Intervention(s): Monitored during session  Home Living                                          Prior Functioning/Environment              Frequency  Min 2X/week        Progress Toward Goals  OT Goals(current goals can now be found in the care plan section)  Progress towards OT goals: Progressing toward goals  Acute Rehab OT Goals Patient Stated Goal: to go to rehab and gain more energy OT Goal Formulation: With patient Time For Goal Achievement: 02/19/19 Potential to Achieve Goals: Good  Plan Discharge plan remains appropriate    Co-evaluation                 AM-PAC OT "6 Clicks" Daily Activity     Outcome Measure   Help from another person eating meals?: None Help from another person taking care of personal grooming?: A Little Help from another person toileting, which includes using toliet, bedpan, or urinal?: A Little Help from another person bathing (including washing, rinsing, drying)?: A Little Help from another person to put on and taking off regular upper body clothing?: None Help from another person to put  on and taking off regular lower body clothing?: A Little 6 Click Score: 20    End of Session Equipment Utilized During Treatment: Gait belt;Rolling walker  OT Visit Diagnosis: Unsteadiness on feet (R26.81);Muscle weakness (generalized) (M62.81);Other symptoms and signs involving cognitive function   Activity Tolerance Patient tolerated treatment well   Patient Left in chair;with call bell/phone within reach   Nurse Communication          Time: 1660-6004 OT Time Calculation (min): 41 min  Charges: OT General Charges $OT Visit: 1 Visit OT Treatments $Self Care/Home Management : 23-37 mins $Therapeutic Activity: 8-22 mins  Nestor Lewandowsky, OTR/L Acute Rehabilitation Services Pager: [phone] Office: [phone]   Malka So 02/12/2019, 12:31 PM

## 2019-02-12 NOTE — Progress Notes (Signed)
Physical Therapy Treatment Patient Details Name: Lee Hall MRN: [id number] DOB: [birth year] Today's Date: 02/12/2019    History of Present Illness 68 year old male with history of CAD, ischemic cardiomyopathy, chronic systolic heart failure admitted 3/5 with septic shock, right upper lobe pneumonia +/- pneumonitis related to immune modulator for his melanoma (nivolaub), brief cardiac arrest in ER. Intubated 3/10-3/11.    PT Comments    Patient progressing well towards PT goals. Tolerated gait training without use of DME today and required Min A and rail in hallway due to weakness, balance deficits and the fact that his "feet feel like they are stuck in mud." Sp02 remained >93% on RA. Pt with pitting edema BLEs and into feet, 2-3+. Encouraged elevation and ankle AROM/pumps to help. Discussed walking program and encouraged walking to bathroom when able. Highly motivated to improve function. Great CIR candidate. Will follow.    Follow Up Recommendations  CIR     Equipment Recommendations  Rolling walker with 5" wheels    Recommendations for Other Services       Precautions / Restrictions Precautions Precautions: Fall;Other (comment) Restrictions Weight Bearing Restrictions: No    Mobility  Bed Mobility Overal bed mobility: Modified Independent             General bed mobility comments: up in chair upon PT arrival.   Transfers Overall transfer level: Needs assistance Equipment used: Rolling walker (2 wheeled) Transfers: Sit to/from Stand Sit to Stand: Supervision         General transfer comment: Supervision for safety.   Ambulation/Gait Ambulation/Gait assistance: Min assist Gait Distance (Feet): 100 Feet Assistive device: None;Rolling walker (2 wheeled) Gait Pattern/deviations: Step-through pattern;Decreased stride length Gait velocity: decreased   General Gait Details: Slow, unsteady gait holding onto rail in hallway requiring Min A for balance  -"my feet feel like they are stuck in the mud," able toprogress to min guard with use of RW. Cues to look straight ahead. Sp02 remained >93% on RA.    Stairs             Wheelchair Mobility    Modified Rankin (Stroke Patients Only)       Balance Overall balance assessment: Needs assistance   Sitting balance-Leahy Scale: Good Sitting balance - Comments: Able to donn socks sitting in chair without difficulty.      Standing balance-Leahy Scale: Fair Standing balance comment: can release walker in static standing at sink without LOB                            Cognition Arousal/Alertness: Awake/alert Behavior During Therapy: Flat affect Overall Cognitive Status: Within Functional Limits for tasks assessed                                 General Comments: pt very pragmatic      Exercises      General Comments General comments (skin integrity, edema, etc.): Sister present during session. Pitting edema present BLEs 2-3+.  Encouraged ankle pumps and elevation.      Pertinent Vitals/Pain Pain Assessment: Faces Faces Pain Scale: Hurts a little bit Pain Location: chest (from compressions) Pain Descriptors / Indicators: Sore Pain Intervention(s): Monitored during session    Home Living                      Prior Function  PT Goals (current goals can now be found in the care plan section) Acute Rehab PT Goals Patient Stated Goal: to go to rehab and gain more energy Progress towards PT goals: Progressing toward goals    Frequency    Min 3X/week      PT Plan Current plan remains appropriate    Co-evaluation              AM-PAC PT "6 Clicks" Mobility   Outcome Measure  Help needed turning from your back to your side while in a flat bed without using bedrails?: A Little Help needed moving from lying on your back to sitting on the side of a flat bed without using bedrails?: A Little Help needed moving to  and from a bed to a chair (including a wheelchair)?: A Little Help needed standing up from a chair using your arms (e.g., wheelchair or bedside chair)?: A Little Help needed to walk in hospital room?: A Little Help needed climbing 3-5 steps with a railing? : A Lot 6 Click Score: 17    End of Session Equipment Utilized During Treatment: Gait belt Activity Tolerance: Patient tolerated treatment well Patient left: in chair;with call bell/phone within reach;with family/visitor present Nurse Communication: Mobility status PT Visit Diagnosis: Unsteadiness on feet (R26.81)     Time: 6701-4103 PT Time Calculation (min) (ACUTE ONLY): 26 min  Charges:  $Gait Training: 8-22 mins $Therapeutic Activity: 8-22 mins                     Wray Kearns, PT, DPT Acute Rehabilitation Services Pager [phone] Office [phone]       [place] 02/12/2019, 1:12 PM

## 2019-02-12 NOTE — Progress Notes (Addendum)
Progress Note  Patient Name: Lee Hall Date of Encounter: 02/12/2019  Primary Cardiologist: Will Meredith Leeds, MD   Subjective   Pt still has some chest soreness due to CPR but no anginal type chest pain. Breathing is OK. He is still "wobbly" on his feet. Pt says there is plan to try to get him to inpatient rehab, he hopes this afternoon.   Inpatient Medications    Scheduled Meds: . aspirin EC  81 mg Oral Daily  . furosemide  40 mg Intravenous Daily  . insulin aspart  0-15 Units Subcutaneous TID WC  . insulin glargine  10 Units Subcutaneous QHS  . predniSONE  40 mg Oral BID WC  . Ensure Max Protein  11 oz Oral BID  . senna-docusate  1 tablet Oral BID  . sodium chloride flush  3 mL Intravenous Q12H  . zolpidem  5 mg Oral QHS   Continuous Infusions: . sodium chloride     PRN Meds: sodium chloride, acetaminophen, albuterol, bisacodyl, oxyCODONE, polyethylene glycol, sodium chloride flush, traMADol   Vital Signs    Vitals:   02/12/19 0004 02/12/19 0334 02/12/19 0412 02/12/19 0743  BP: 107/68 108/68  108/70  Pulse: 60 60  60  Resp: 18 20 13  (!) 22  Temp: 97.6 F (36.4 C) 97.8 F (36.6 C)  (!) 96.3 F (35.7 C)  TempSrc: Oral Oral  Axillary  SpO2: 97% 99%  100%  Weight:   78.1 kg   Height:        Intake/Output Summary (Last 24 hours) at 02/12/2019 0932 Last data filed at 02/12/2019 0902 Gross per 24 hour  Intake 480 ml  Output 3010 ml  Net -2530 ml   Last 3 Weights 02/12/2019 02/11/2019 02/10/2019  Weight (lbs) 172 lb 2.9 oz 178 lb 9.2 oz 181 lb  Weight (kg) 78.1 kg 81 kg 82.1 kg      Telemetry    atrial pacing at 60 - Personally Reviewed  ECG    No new tracings - Personally Reviewed  Physical Exam   GEN: No acute distress.   Neck: No JVD Cardiac: RRR, no murmurs, rubs, or gallops.  Respiratory: Clear to auscultation bilaterally. GI: Soft, nontender, non-distended  MS: 1+ lower leg edema; Right arm with dependent edema.  Neuro:   Nonfocal  Psych: Normal affect   Labs    Chemistry Recent Labs  Lab 02/10/19 0331 02/11/19 0339 02/12/19 0242  NA 131* 133* 134*  K 4.1 4.1 4.1  CL 96* 98 100  CO2 26 25 24   GLUCOSE 266* 221* 212*  BUN 56* 52* 48*  CREATININE 1.61* 1.36* 1.20  CALCIUM 8.1* 8.4* 8.4*  PROT 5.2*  --   --   ALBUMIN 2.5* 2.7*  --   AST 55*  --   --   ALT 558*  --   --   ALKPHOS 131*  --   --   BILITOT 1.5*  --   --   GFRNONAA 44* 54* >60  GFRAA 51* >60 >60  ANIONGAP 9 10 10      Hematology Recent Labs  Lab 02/10/19 0331 02/11/19 0339 02/12/19 0242  WBC 11.9* 12.7* 9.7  RBC 4.93 5.30 5.06  HGB 14.3 15.4 14.4  HCT 42.2 46.3 44.2  MCV 85.6 87.4 87.4  MCH 29.0 29.1 28.5  MCHC 33.9 33.3 32.6  RDW 13.9 14.2 14.2  PLT 35* 40* 54*    Cardiac EnzymesNo results for input(s): TROPONINI in the last 168 hours. No results  for input(s): TROPIPOC in the last 168 hours.   BNPNo results for input(s): BNP, PROBNP in the last 168 hours.   DDimer No results for input(s): DDIMER in the last 168 hours.   Radiology    No results found.  Cardiac Studies   Echo 02/01/19  1. The left ventricle has severely reduced systolic function, with an ejection fraction of 20-25%. The cavity size was moderately dilated. Left ventricular diastology could not be evaluated due to nondiagnostic images. Elevated left atrial and left  ventricular end-diastolic pressures. 2. There is akinesis of the mid to apical inferior, anterior, lateral, inferolateral, apical and apical septal walls. There is akinesis of the mid anteroseptal and inferoseptal wall. 3. The right ventricle has normal systolic function. The cavity was normal. There is no increase in right ventricular wall thickness. Right ventricular systolic pressure is moderately elevated with an estimated pressure of 55.4 mmHg. 4. Left atrial size was severely dilated. 5. The mitral valve is normal in structure. There is mild mitral annular calcification  present. Mitral valve regurgitation is moderate to severe by color flow Doppler. 6. The tricuspid valve is normal in structure. Tricuspid valve regurgitation is mild-moderate. 7. The aortic valve is tricuspid Moderate thickening of the aortic valve Moderate calcification of the aortic valve. Aortic valve regurgitation is moderate by color flow Doppler. 8. The pulmonic valve was normal in structure. 9. The inferior vena cava was dilated in size with <50% respiratory variability   Patient Profile     68 y.o. male with a hx of ICM s/p ICD/PPM, VT CAD with occluded LAD/collaterals, melanoma,who is being seen for the evaluation of cardiac arrest and + troponinat the request ofDr. Nelda Marseille.  Assessment & Plan    Septic shock/Respiratory Failure -Felt to be related to PNA vs pneumonitis. Pt has been on immunotherapy for melanoma. He has now completed 7 days of vanc/cefipime.  -Off heart failure meds.  -BP soft but stable -Pt had brief cardiac arrest in ED with rapid deterioration of respiratory status.  -Pt is much improved. Not on oxygen.   Acute on chronic systolic CHF -LVEF 16-10%, was 25-35% in 01/2018. Now has BiV ICD.  -Home amiodarone, entresto, coreg, mexitil on hold in setting of respiratory failure and septic shock.  -Pt was diuresed on lasix drip, now on lasix 40 mg IV daily with good diuresis.  -Pt has had 3L UOP for the last 2 days. He is net -14L since admission.  -Pt still has lower extremity edema. Lungs clear. No JVD.  -Will slowly resume his HF meds with carvedilol 3.125 mg BID today. BP not strong enough to support Entresto at this time. Will recheck LFTs in am and consider restarting Mexitil tomorrow.   CAD -Known underlying CAD including occluded LAD with collaterals.  -on aspirin -Troponins mildly elevated, suspected to be related to demand ischemia. No invasive workup planned.  Acute renal injury -Creatinine had been up to 2.55 on 3/6. Improving with diuresis.  Cr 1.20 today.  -Pt is having problems with urinary retention.   Transaminase/shock liver -LFTs improving  ICD/PPM -History of VT, had VT storm in 2019. He had been on amiodarone in ~2005. In ~2010 had to come off amio due to thyroid problems. placed on carvedilol and sotalol. Pt had VT storm in 2019 and put back on amiodarone with carvedilol and Mexitil.  -Last gen change 03/2018, upgraded to BiV ICD, but LV lead has not yet been implanted.    For questions or updates, please contact  [facility] Please consult [email or web address] for contact info under        Signed, Daune Perch, NP  02/12/2019, 9:32 AM    I have seen and examined the patient along with Daune Perch, NP.  I have reviewed the chart, notes and new data.  I agree with PA/NP's note.  Key new complaints: Legs still feel weak, but he was able to walk to the bathroom and back without support.  Denies dyspnea at rest.  Worried about the risk of recurrent arrhythmia.  Very motivated to pursue rehab. Key examination changes: Appears chronically ill.  Jugular venous pulsations are not elevated, but still has 1-2+ soft pitting edema of both ankles and prominent lymphedema of the right forearm.  Widely split second heart sound.  Healthy ICD site. Key new findings / data: No significant arrhythmia on monitor.  Steadily improving renal parameters and transaminases.  QRS is very broad at 182 ms.  PLAN: Resume carvedilol 3.125 mg twice daily today. Resume amiodarone when LFTs are less than 4 times upper limit of normal. Consider restarting mexiletine tomorrow. To restart Entresto last, as blood pressure allows. We will continue to follow him if he goes to inpatient rehab, gradually reestablishing chronic heart failure/arrhythmia medications.  Sanda Klein, MD, [place] [phone] 02/12/2019, 11:05 AM

## 2019-02-12 NOTE — Consult Note (Addendum)
Physical Medicine and Rehabilitation Consult Reason for Consult: Decreased functional mobility/septic shock Referring Physician: [facility]   HPI: Lee Hall is a 68 y.o.right handed male with history of CAD, ischemic cardiomyopathy, chronic systolic congestive heart failure with history of VT-status post pacemaker on chronic amiodarone, melanoma. History taken from chart review and patient. Patient recently treated over the last 2 weeks as an outpatient for right upper lobe pneumonia initially with doxycycline return to PCP with complaints of ongoing cough shortness of breath changed to Levaquin. Per chart review patient lives alone independent prior to admission. One level apartment with 14 steps to entry. Patient plans to stay with his sister and brother-in-law on discharge. Presented 02/01/2019 with progressive SOB, diaphoresis and dry cough. Patient became increasingly dyspneic in the ER requiring intubation had a brief cardiac arrest requiring one cycle of CPR.EKG showed sinus rhythm with paced AV beats. Post arrest he was slightly agitated with purposeful movements he remained significantly hypotensive systolic in the 25Z requiring Levophed and low dose epi drip. Noted lactic acid 4.8-9.3, WBC 15,800 creatinine 1.47, troponin 0.6. He did receive 3 L of total IV fluids as well as Solu-Medrol. Maintained on broad-spectrum antibiotics. Echocardiogram with ejection fraction of 25% severely reduced systolic function. Creatinine has been stable 1.89-1.20 and monitored closely. Low-dose aspirin as per cardiology services. Intermittent bouts agitation suspect acute metabolic encephalopathy related to septic shock cardiac arrest. Therapy evaluations completed with recommendations of physical medicine rehabilitation consult.  Review of Systems  Constitutional: Negative for chills and fever.  HENT: Negative for hearing loss.   Eyes: Negative for blurred vision and double vision.  Respiratory:  Positive for shortness of breath.   Cardiovascular: Positive for leg swelling. Negative for chest pain.  Gastrointestinal: Positive for constipation. Negative for heartburn, nausea and vomiting.  Genitourinary: Negative for dysuria, flank pain and hematuria.  Musculoskeletal: Positive for myalgias.  Skin: Negative for rash.  Neurological: Positive for weakness.  Psychiatric/Behavioral:       Anxiety  All other systems reviewed and are negative.  Past Medical History:  Diagnosis Date  . Anxiety   . CAD (coronary artery disease)   . Heart failure, systolic, acute on chronic ([place])   . ICD (implantable cardiac defibrillator), single, in situ   . ISCHEMIC CARDIOMYOPATHY 03/19/2009   Qualifier: Diagnosis of  By: Boyce Medici RN, BSN, [place]    . LEFT VENTRICULAR MURAL THROMBUS 02/18/2009   Qualifier: Diagnosis of  By: Stevie Kern NP-C, Sharyn Lull    . Melanoma ([place]) 2019  . MI (myocardial infarction) ([place]) 08/2003  . PAROXYSMAL VENTRICULAR TACHYCARDIA 03/19/2009   Qualifier: Diagnosis of  By: Boyce Medici RN, BSN, [place]    . Pneumonia 02/08/2019  . Pre-diabetes   . SYSTOLIC HEART FAILURE, ACUTE ON CHRONIC 01/02/2009   Qualifier: Diagnosis of  By: Stevie Kern NP-C, Sharyn Lull    . Ventricular tachycardia [facility])    Past Surgical History:  Procedure Laterality Date  . ABLATION  03/2018   IN [place]  . AXILLARY LYMPH NODE DISSECTION Right 10/05/2018   RIGHT AXILLARY LYMPH NODE DISSECTION   . AXILLARY LYMPH NODE DISSECTION Right 10/05/2018   Procedure: RIGHT AXILLARY LYMPH NODE DISSECTION;  Surgeon: Stark Klein, MD;  Location: [facility];  Service: General;  Laterality: Right;  . CARDIAC CATHETERIZATION     01/30/18 [facility]): Occluded LAD. Patent LCX and RCA. Normal LVEDP 14 mmHg. Medical managment.  Randolm Idol / REPLACE / REMOVE PACEMAKER    . SKIN BIOPSY  2019   melanoma  R shoulder  . TONSILLECTOMY AND ADENOIDECTOMY  1969   Family History  Problem Relation Age of Onset  . Alzheimer's disease Mother   .  Kidney Stones Father   . Diabetes Father   . Alcohol abuse Father   . Alzheimer's disease Maternal Grandmother    Social History:  reports that he has quit smoking. He has never used smokeless tobacco. He reports current alcohol use. He reports that he does not use drugs. Allergies: No Known Allergies Medications Prior to Admission  Medication Sig Dispense Refill  . acyclovir (ZOVIRAX) 800 MG tablet Take 400 mg by mouth every other day.     . albuterol (PROVENTIL HFA;VENTOLIN HFA) 108 (90 Base) MCG/ACT inhaler Inhale 1-2 puffs into the lungs every 6 (six) hours as needed for wheezing or shortness of breath.    Marland Kitchen amiodarone (PACERONE) 200 MG tablet Take 1 tablet (200 mg total) by mouth daily. 90 tablet 3  . aspirin 81 MG tablet Take 81 mg by mouth daily.      . carvedilol (COREG) 6.25 MG tablet Take 1 tablet (6.25 mg total) by mouth 2 (two) times daily with a meal. 180 tablet 3  . empagliflozin (JARDIANCE) 10 MG TABS tablet Take 10 mg by mouth daily. 30 tablet 2  . ipratropium (ATROVENT) 0.06 % nasal spray Place 2 sprays into both nostrils 3 (three) times daily. 90 mL 3  . [EXPIRED] levofloxacin (LEVAQUIN) 750 MG tablet Take 1 tablet (750 mg total) by mouth daily for 7 days. 7 tablet 0  . metFORMIN (GLUCOPHAGE-XR) 500 MG 24 hr tablet Take 500 mg by mouth 2 (two) times daily.     Marland Kitchen mexiletine (MEXITIL) 150 MG capsule Take 1 capsule (150 mg total) by mouth 3 (three) times daily. 270 capsule 3  . rosuvastatin (CRESTOR) 10 MG tablet Take 10 mg by mouth every evening.     . sacubitril-valsartan (ENTRESTO) 24-26 MG Take 1 tablet by mouth 2 (two) times daily. 60 tablet 11  . Spacer/Aero-Holding Chambers (E-Z SPACER) inhaler Use as instructed 1 each 2    Home: Home Living Family/patient expects to be discharged to:: Private residence Living Arrangements: Alone Available Help at Discharge: Family, Available PRN/intermittently Type of Home: Apartment Home Access: Stairs to enter [employer] of Steps: 14 Entrance Stairs-Rails: Right, Left, Can reach both Home Layout: One level Bathroom Shower/Tub: Tub/shower unit, Architectural technologist: Standard Bathroom Accessibility: Yes Home Equipment: None  Functional History: Prior Function Level of Independence: Independent Comments: drives; independent with financial and medicaiton managment Functional Status:  Mobility: Bed Mobility Overal bed mobility: Needs Assistance Bed Mobility: Supine to Sit, Sit to Supine Rolling: Supervision Supine to sit: Min guard, HOB elevated Sit to supine: Min assist General bed mobility comments: assist for LEs into bed, HOB up, use of rail Transfers Overall transfer level: Needs assistance Equipment used: Rolling walker (2 wheeled) Transfers: Sit to/from Stand Sit to Stand: Min assist General transfer comment: steadying assist, cues for hand placement Ambulation/Gait Ambulation/Gait assistance: Min assist Gait Distance (Feet): 30 Feet Assistive device: Rolling walker (2 wheeled) Gait Pattern/deviations: Step-to pattern, Step-through pattern General Gait Details: patient weak and unsteady, ambulating slow and cautious. VSS on RA.  Gait velocity: decreased    ADL: ADL Overall ADL's : Needs assistance/impaired Eating/Feeding: Independent, Sitting Eating/Feeding Details (indicate cue type and reason): struggles, but is able to open containers Grooming: Oral care, Wash/dry hands, Wash/dry face, Brushing hair, Sitting, Set up Grooming Details (indicate cue type and reason): at sink  Upper Body Bathing: Minimal assistance, Sitting Upper Body Bathing Details (indicate cue type and reason): for back Lower Body Bathing: Moderate assistance, Sit to/from stand Upper Body Dressing : Minimal assistance, Sitting Lower Body Dressing: Moderate assistance, Sit to/from stand Toilet Transfer: Minimal assistance, RW, Ambulation Toileting- Clothing Manipulation and Hygiene: Moderate  assistance, Sit to/from stand Functional mobility during ADLs: Minimal assistance, Rolling walker  Cognition: Cognition Overall Cognitive Status: Within Functional Limits for tasks assessed Orientation Level: Oriented X4 Cognition Arousal/Alertness: Awake/alert Behavior During Therapy: Flat affect Overall Cognitive Status: Within Functional Limits for tasks assessed General Comments: pt very pragmatic, labile at times  Blood pressure 108/68, pulse 60, temperature 97.8 F (36.6 C), temperature source Oral, resp. rate 13, height 5\' 11"  (1.803 m), weight 78.1 kg, SpO2 99 %. Physical Exam  Vitals reviewed. Constitutional: He is oriented to person, place, and time. He appears well-developed and well-nourished.  HENT:  Head: Normocephalic and atraumatic.  Eyes: EOM are normal. Right eye exhibits no discharge. Left eye exhibits no discharge.  Neck: Normal range of motion. Neck supple.  Neck dressing c/d/i  Cardiovascular:  Irregularly irregular  Respiratory: Effort normal and breath sounds normal.  GI: Soft. Bowel sounds are normal.  Musculoskeletal:     Comments: Generalized edema, RUE >>B/l LE> LUE with lymphedema  Neurological: He is alert and oriented to person, place, and time.  Patient is alert sitting up in bed.  Makes good eye contact with examiner.  He did provide his name and age and the present day of the week.  Follows commands Motor: Grossly 4+/5 throughout  Skin: Skin is warm and dry.  Psychiatric: He has a normal mood and affect. His behavior is normal.    Results for orders placed or performed during the hospital encounter of 02/01/19 (from the past 24 hour(s))  Glucose, capillary     Status: Abnormal   Collection Time: 02/11/19 11:13 AM  Result Value Ref Range   Glucose-Capillary 120 (H) 70 - 99 mg/dL  Glucose, capillary     Status: Abnormal   Collection Time: 02/11/19  4:03 PM  Result Value Ref Range   Glucose-Capillary 190 (H) 70 - 99 mg/dL  Glucose,  capillary     Status: Abnormal   Collection Time: 02/11/19  9:29 PM  Result Value Ref Range   Glucose-Capillary 295 (H) 70 - 99 mg/dL  Basic metabolic panel     Status: Abnormal   Collection Time: 02/12/19  2:42 AM  Result Value Ref Range   Sodium 134 (L) 135 - 145 mmol/L   Potassium 4.1 3.5 - 5.1 mmol/L   Chloride 100 98 - 111 mmol/L   CO2 24 22 - 32 mmol/L   Glucose, Bld 212 (H) 70 - 99 mg/dL   BUN 48 (H) 8 - 23 mg/dL   Creatinine, Ser 1.20 0.61 - 1.24 mg/dL   Calcium 8.4 (L) 8.9 - 10.3 mg/dL   GFR calc non Af Amer >60 >60 mL/min   GFR calc Af Amer >60 >60 mL/min   Anion gap 10 5 - 15  CBC     Status: Abnormal   Collection Time: 02/12/19  2:42 AM  Result Value Ref Range   WBC 9.7 4.0 - 10.5 K/uL   RBC 5.06 4.22 - 5.81 MIL/uL   Hemoglobin 14.4 13.0 - 17.0 g/dL   HCT 44.2 39.0 - 52.0 %   MCV 87.4 80.0 - 100.0 fL   MCH 28.5 26.0 - 34.0 pg   MCHC 32.6 30.0 - 36.0  g/dL   RDW 14.2 11.5 - 15.5 %   Platelets 54 (L) 150 - 400 K/uL   nRBC 0.0 0.0 - 0.2 %  Magnesium     Status: None   Collection Time: 02/12/19  2:42 AM  Result Value Ref Range   Magnesium 2.2 1.7 - 2.4 mg/dL  Glucose, capillary     Status: Abnormal   Collection Time: 02/12/19  6:31 AM  Result Value Ref Range   Glucose-Capillary 130 (H) 70 - 99 mg/dL   No results found.  Assessment/Plan: Diagnosis: Debility Labs independently reviewed.  Records reviewed and summated above.  1. Does the need for close, 24 hr/day medical supervision in concert with the patient's rehab needs make it unreasonable for this patient to be served in a less intensive setting? Potentially 2. Co-Morbidities requiring supervision/potential complications: CAD, ischemic cardiomyopathy, chronic systolic congestive heart failure (Monitor in accordance with increased physical activity and avoid UE resistance excercises), history of V T-status post pacemaker on chronic amiodarone, melanoma, afib (monitor HR with increased activity, cont meds),  steroid induced hyperglycemia (Monitor in accordance with exercise and adjust meds as necessary), hyponatremia (repeat labs, cont to monitor, treat if necessary) 3. Due to safety, skin/wound care, disease management and patient education, does the patient require 24 hr/day rehab nursing? Yes 4. Does the patient require coordinated care of a physician, rehab nurse, PT (1-2 hrs/day, 5 days/week) and OT (1-2 hrs/day, 5 days/week) to address physical and functional deficits in the context of the above medical diagnosis(es)? Yes Addressing deficits in the following areas: balance, endurance, locomotion, strength, transferring, bathing, dressing, toileting and psychosocial support 5. Can the patient actively participate in an intensive therapy program of at least 3 hrs of therapy per day at least 5 days per week? Potentially 6. The potential for patient to make measurable gains while on inpatient rehab is good 7. Anticipated functional outcomes upon discharge from inpatient rehab are modified independent and supervision  with PT, modified independent and supervision with OT, n/a with SLP. 8. Estimated rehab length of stay to reach the above functional goals is: 8-12 days. 9. Anticipated D/C setting: Home 10. Anticipated post D/C treatments: HH therapy and Home excercise program 11. Overall Rehab/Functional Prognosis: good  RECOMMENDATIONS: This patient's condition is appropriate for continued rehabilitative care in the following setting: CIR once medically stable Patient has agreed to participate in recommended program. Yes Note that insurance prior authorization may be required for reimbursement for recommended care.  Comment: Rehab Admissions Coordinator to follow up.   I have personally performed a face to face diagnostic evaluation, including, but not limited to relevant history and physical exam findings, of this patient and developed relevant assessment and plan.  Additionally, I have reviewed  and concur with the physician assistant's documentation above.   Delice Lesch, MD, ABPMR Lavon Paganini Angiulli, PA-C 02/12/2019

## 2019-02-13 LAB — COMPREHENSIVE METABOLIC PANEL
ALT: 295 U/L — ABNORMAL HIGH (ref 0–44)
AST: 44 U/L — ABNORMAL HIGH (ref 15–41)
Albumin: 2.7 g/dL — ABNORMAL LOW (ref 3.5–5.0)
Alkaline Phosphatase: 114 U/L (ref 38–126)
Anion gap: 8 (ref 5–15)
BUN: 49 mg/dL — ABNORMAL HIGH (ref 8–23)
CALCIUM: 8.7 mg/dL — AB (ref 8.9–10.3)
CO2: 26 mmol/L (ref 22–32)
Chloride: 100 mmol/L (ref 98–111)
Creatinine, Ser: 1.29 mg/dL — ABNORMAL HIGH (ref 0.61–1.24)
GFR calc non Af Amer: 58 mL/min — ABNORMAL LOW (ref >60)
Glucose, Bld: 199 mg/dL — ABNORMAL HIGH (ref 70–99)
Potassium: 4.7 mmol/L (ref 3.5–5.1)
Sodium: 134 mmol/L — ABNORMAL LOW (ref 135–145)
Total Bilirubin: 1.8 mg/dL — ABNORMAL HIGH (ref 0.3–1.2)
Total Protein: 5.5 g/dL — ABNORMAL LOW (ref 6.5–8.1)

## 2019-02-13 LAB — MAGNESIUM: Magnesium: 2.2 mg/dL (ref 1.7–2.4)

## 2019-02-13 LAB — GLUCOSE, CAPILLARY
GLUCOSE-CAPILLARY: 181 mg/dL — AB (ref 70–99)
Glucose-Capillary: 146 mg/dL — ABNORMAL HIGH (ref 70–99)
Glucose-Capillary: 113 mg/dL — ABNORMAL HIGH (ref 70–99)

## 2019-02-13 LAB — CBC
HCT: 45.3 % (ref 39.0–52.0)
Hemoglobin: 15.1 g/dL (ref 13.0–17.0)
MCH: 29.4 pg (ref 26.0–34.0)
MCHC: 33.3 g/dL (ref 30.0–36.0)
MCV: 88.3 fL (ref 80.0–100.0)
PLATELETS: 68 10*3/uL — AB (ref 150–400)
RBC: 5.13 MIL/uL (ref 4.22–5.81)
RDW: 14.5 % (ref 11.5–15.5)
WBC: 9.7 10*3/uL (ref 4.0–10.5)
nRBC: 0 % (ref 0.0–0.2)

## 2019-02-13 MED ORDER — PREDNISONE 20 MG PO TABS
30.0000 mg | ORAL_TABLET | Freq: Every day | ORAL | Status: DC
  Administered 2019-02-14: 30 mg via ORAL
  Filled 2019-02-13: qty 1

## 2019-02-13 MED ORDER — MEXILETINE HCL 150 MG PO CAPS
150.0000 mg | ORAL_CAPSULE | Freq: Three times a day (TID) | ORAL | Status: DC
  Administered 2019-02-13 – 2019-02-14 (×3): 150 mg via ORAL
  Filled 2019-02-13 (×4): qty 1

## 2019-02-13 MED ORDER — SACUBITRIL-VALSARTAN 24-26 MG PO TABS
1.0000 | ORAL_TABLET | Freq: Two times a day (BID) | ORAL | Status: DC
  Administered 2019-02-13 – 2019-02-14 (×2): 1 via ORAL
  Filled 2019-02-13 (×2): qty 1

## 2019-02-13 NOTE — Progress Notes (Addendum)
Progress Note  Patient Name: Lee Hall Date of Encounter: 02/13/2019  Primary Cardiologist: Cristopher Peru, MD   Subjective   Pt is feeling stronger. No chest discomfort or shortness of breath.   Inpatient Medications    Scheduled Meds: . aspirin EC  81 mg Oral Daily  . carvedilol  3.125 mg Oral BID WC  . furosemide  40 mg Intravenous Daily  . insulin aspart  0-15 Units Subcutaneous TID WC  . insulin glargine  10 Units Subcutaneous QHS  . ipratropium  2 spray Each Nare TID  . predniSONE  40 mg Oral BID WC  . Ensure Max Protein  11 oz Oral BID  . senna-docusate  1 tablet Oral BID  . sodium chloride flush  3 mL Intravenous Q12H  . zolpidem  5 mg Oral QHS   Continuous Infusions: . sodium chloride     PRN Meds: sodium chloride, acetaminophen, albuterol, bisacodyl, oxyCODONE, polyethylene glycol, sodium chloride flush, traMADol   Vital Signs    Vitals:   02/12/19 1724 02/12/19 2039 02/13/19 0026 02/13/19 0418  BP:  (!) 93/59 108/63   Pulse: 65  60   Resp:  18 18 14   Temp:  (!) 97.5 F (36.4 C) 97.7 F (36.5 C)   TempSrc:  Oral Oral   SpO2:  98%    Weight:    77.7 kg  Height:        Intake/Output Summary (Last 24 hours) at 02/13/2019 0914 Last data filed at 02/13/2019 0021 Gross per 24 hour  Intake 200 ml  Output 1525 ml  Net -1325 ml   Last 3 Weights 02/13/2019 02/12/2019 02/11/2019  Weight (lbs) 171 lb 6.4 oz 172 lb 2.9 oz 178 lb 9.2 oz  Weight (kg) 77.747 kg 78.1 kg 81 kg      Telemetry    Atrial pacing at 60 bpm.  - Personally Reviewed  ECG    No new tracings - Personally Reviewed  Physical Exam   GEN: No acute distress.   Neck: No JVD Cardiac: RRR, no murmurs, rubs, or gallops.  Respiratory: Clear to auscultation bilaterally. GI: Soft, nontender, non-distended  MS: 1+ soft lower leg edema; Right arm with dependent edema. Multiple skin tears on bilateral arms Neuro:  Nonfocal  Psych: Normal affect   Labs    Chemistry Recent  Labs  Lab 02/10/19 0331 02/11/19 0339 02/12/19 0242 02/13/19 0310  NA 131* 133* 134* 134*  K 4.1 4.1 4.1 4.7  CL 96* 98 100 100  CO2 26 25 24 26   GLUCOSE 266* 221* 212* 199*  BUN 56* 52* 48* 49*  CREATININE 1.61* 1.36* 1.20 1.29*  CALCIUM 8.1* 8.4* 8.4* 8.7*  PROT 5.2*  --   --  5.5*  ALBUMIN 2.5* 2.7*  --  2.7*  AST 55*  --   --  44*  ALT 558*  --   --  295*  ALKPHOS 131*  --   --  114  BILITOT 1.5*  --   --  1.8*  GFRNONAA 44* 54* >60 58*  GFRAA 51* >60 >60 >60  ANIONGAP 9 10 10 8      Hematology Recent Labs  Lab 02/11/19 0339 02/12/19 0242 02/13/19 0310  WBC 12.7* 9.7 9.7  RBC 5.30 5.06 5.13  HGB 15.4 14.4 15.1  HCT 46.3 44.2 45.3  MCV 87.4 87.4 88.3  MCH 29.1 28.5 29.4  MCHC 33.3 32.6 33.3  RDW 14.2 14.2 14.5  PLT 40* 54* 68*    Cardiac  EnzymesNo results for input(s): TROPONINI in the last 168 hours. No results for input(s): TROPIPOC in the last 168 hours.   BNPNo results for input(s): BNP, PROBNP in the last 168 hours.   DDimer No results for input(s): DDIMER in the last 168 hours.   Radiology    No results found.  Cardiac Studies   Echo 02/01/19  1. The left ventricle has severely reduced systolic function, with an ejection fraction of 20-25%. The cavity size was moderately dilated. Left ventricular diastology could not be evaluated due to nondiagnostic images. Elevated left atrial and left  ventricular end-diastolic pressures. 2. There is akinesis of the mid to apical inferior, anterior, lateral, inferolateral, apical and apical septal walls. There is akinesis of the mid anteroseptal and inferoseptal wall. 3. The right ventricle has normal systolic function. The cavity was normal. There is no increase in right ventricular wall thickness. Right ventricular systolic pressure is moderately elevated with an estimated pressure of 55.4 mmHg. 4. Left atrial size was severely dilated. 5. The mitral valve is normal in structure. There is mild mitral  annular calcification present. Mitral valve regurgitation is moderate to severe by color flow Doppler. 6. The tricuspid valve is normal in structure. Tricuspid valve regurgitation is mild-moderate. 7. The aortic valve is tricuspid Moderate thickening of the aortic valve Moderate calcification of the aortic valve. Aortic valve regurgitation is moderate by color flow Doppler. 8. The pulmonic valve was normal in structure. 9. The inferior vena cava was dilated in size with <50% respiratory variability  Patient Profile     68 y.o. male with a hx of ICM s/p ICD/PPM, VT CAD with occluded LAD/collaterals, melanoma,who is being seen for the evaluation of cardiac arrest and + troponinat the request ofDr. Nelda Marseille.  Assessment & Plan    Septic shock/Respiratory failure -Felt to be related to PNA vs pneumonitis. Pt has been on immunotherapy for melanoma. Has completed 7 days of vanc/cefipime.  -Was off all HF meds due to hypotension. Plan to slowly restart meds. -Pt much improved. On room air.   Acute on chronic systolic CHF -LVEF 53-66%, was 25-35% in 01/2018. Pt has ICD. -Home amiodarone, entresto, coreg, mexitil held in setting of respiratory failure and septic shock.  -Pt was diuresed on lasix drip, now on lasix 40 mg IV daily with good diuresis. Pt was having significant UOP of over 3L. Yesterday had 1.5 L. He is net negative 15.6 L since admission.  -Wt is down from a max of 205 lbs to 171 lbs. Down 1 lb from yesterday and 7 lbs from the day before.  -Carvedilol restarted yesterday at low dose, 3.125 mg. BP stable.  -Plan to resume amiodarone when LFT's are less than 4 times upper limit.  ALT still up at 295, AST near normal at 44. Total Bili 1.8.  -Will restart Mexiletine today.  -Will leave Entresto to restart last as BP allows.   Acute renal injury -Creatinine had been up to 2.55 on 3/6. Improving with diuresis. Cr 1.29, 1.20 yesterday.  -Pt is having problems with urinary retention.    CAD -Known underlying CAD including occluded LAD with collaterals.  -on aspirin -Troponins mildly elevated, suspected to be related to demand ischemia. No invasive workup planned.  ICD/PPM -History of VT, had VT storm in 2019. He had been on amiodarone in ~2005. In ~2010 had to come off amio due to thyroid problems. placed on carvedilol and sotalol. Pt had VT storm in 2019 and put back on amiodarone with  carvedilol and Mexitil- now held with sepsis.  -Last gen change 03/2018, upgraded to BiV ICD, but LV lead has not yet been implanted. -Low dose carvedilol has been resumed and will resume mexetil. Holding on amiodarone until LFTs improve.  -No VT on telemetry.     For questions or updates, please contact [place] Please consult [email or web address] for contact info under        Signed, Daune Perch, NP  02/13/2019, 9:14 AM    I have seen and examined the patient along with Daune Perch, NP .  I have reviewed the chart, notes and new data.  I agree with NP's note.  Key new complaints: feeling stronger every day Key examination changes: still has some soft edema Key new findings / data: creatinine seems to have reached baseline, rapidly improving LFTs.  PLAN: Stop IV diuretic. Restart mexiletine and Entresto. Probably restart amiodarone tomorrow. Increase back to home dose of carvedilol in another few days. Restart Jardiance soon. Waiting to hear on IP Rehab.  Sanda Klein, MD, [place] [phone] 02/13/2019, 9:54 AM

## 2019-02-13 NOTE — Care Management Important Message (Signed)
Important Message  Patient Details  Name: Lee Hall MRN: [id number] Date of Birth: [birth year]   Medicare Important Message Given:  Yes    Barb Merino Husayn Reim 02/13/2019, 1:59 PM

## 2019-02-13 NOTE — Progress Notes (Signed)
Inpatient Rehabilitation Admissions Coordinator  I have received a denial from [facility] for an inpt rehab admit. I met with patient at bedside and he is aware. He does not want SNF. He requests d/c home with [facility] with his sister. I have notified Dr. Thereasa Solo as well as RN CM, Olga Coaster. We will sign off at this time.  Danne Baxter, RN, MSN Rehab Admissions Coordinator [phone] 02/13/2019 4:31 PM

## 2019-02-13 NOTE — Progress Notes (Signed)
[name] - Stepdown/ICU TEAM  Lee Hall  SEG:[id number] DOB: 09-22-1951 DOA: 02/01/2019 PCP: Lee Macadam, MD    Brief Narrative:  75XT who presented with dyspnea, leg swelling, and hypotension, and suffered a brief cardiac arrest in the ER.  He was found to have septic shock from a RUL PNA.    Significant Events: 3/5 admit - cardiac arrest in ED   Subjective: No new complaints today. No new sob. Denies cp, n/v, or abdom pain. Highly motivated to go home w/ HHPT/OT. Has been denied CIR by his insurance company.   Assessment & Plan:  Septic shock w/ acute hypoxic resp failure due to pneumonia v/s pneumonitis  has been on immuno-therapy for melanoma - completed 7 days of vanc/cefepime - plan for very slow steroid taper - clinically much improved at this time - stable on RA  Acute renal failure - ATN  Creatinine responded favorably to diuresis initially, but has climbed a bit today - diuretic now stopped   Urinary retention Resolved - has done well post foley removal   Transaminitis - Shock Liver  LFTs cont to improve  Cardiac arrest due to respiratory failure and septic shock Cardiology has evaluated  CAD - elevated troponin Felt to be demand ischemia - known occluded LAD w/ collaterals   Ischemic Chronic systolic CHF - hx of VT EF 20-25% - has ICD/PPM - net negative ~16L thus far - HF meds being adjusted by Cards - diuretic on hold due to climbing crt  Filed Weights   02/11/19 0629 02/12/19 0412 02/13/19 0418  Weight: 81 kg 78.1 kg 77.7 kg    Mod/Severe mitral regurgitation Noted on TTE 02/01/19  Mod Aortic valve regurgitation  Noted on TTE 02/01/19   DM2 CBG improving - meds adjusted 3/13 - follow w/o change today    Acute metabolic encephalopathy Due to above - resolved   Thrombocytopenia  No evidence of HIT - holding lovenox/hep - likely due to sepsis alone - PLT count still slowly climbing   T3, N3 melanoma Rt shoulder dx  January 2019 - followed by Lee Hall - on Nivolumab since January 2020  DVT prophylaxis: SCDs Code Status: FULL CODE Family Communication: no family present at time of exam  Disposition Plan: since CIR is denied, will cont to work w/ PT/OT - possible d/c home w/ [facility] in 24-48hrs   Consultants:  Cardiology   Antimicrobials:  Rocephin 3/5  Azithromycin 3/5 > 3/7 Cefepime 3/5 > 3/11 Vancomycin 3/5 > 3/11  Objective: Blood pressure 107/65, pulse 60, temperature 98.2 F (36.8 C), temperature source Oral, resp. rate 14, height 5\' 11"  (1.803 m), weight 77.7 kg, SpO2 98 %.  Intake/Output Summary (Last 24 hours) at 02/13/2019 1610 Last data filed at 02/13/2019 1300 Gross per 24 hour  Intake 480 ml  Output 1250 ml  Net -770 ml   Filed Weights   02/11/19 0629 02/12/19 0412 02/13/19 0418  Weight: 81 kg 78.1 kg 77.7 kg    Examination: General: No acute respiratory distress at rest  Lungs: Clear to auscultation B w/o wheezing  Cardiovascular: RRR - distant HS  Abdomen: NT/ND, soft, bowel sounds positive Extremities: 2+ diffuse edema of arms and legs / anasarca w/ modest improvement on exam again today   CBC: Recent Labs  Lab 02/11/19 0339 02/12/19 0242 02/13/19 0310  WBC 12.7* 9.7 9.7  HGB 15.4 14.4 15.1  HCT 46.3 44.2 45.3  MCV 87.4 87.4 88.3  PLT 40* 54* 68*  Basic Metabolic Panel: Recent Labs  Lab 02/09/19 0258 02/10/19 0331 02/11/19 0339 02/12/19 0242 02/13/19 0310  NA 133* 131* 133* 134* 134*  K 3.6 4.1 4.1 4.1 4.7  CL 94* 96* 98 100 100  CO2 30 26 25 24 26   GLUCOSE 243* 266* 221* 212* 199*  BUN 64* 56* 52* 48* 49*  CREATININE 1.54* 1.61* 1.36* 1.20 1.29*  CALCIUM 8.2* 8.1* 8.4* 8.4* 8.7*  MG 2.4 2.4  --  2.2 2.2  PHOS 4.6 3.8 3.7  --   --    GFR: Estimated Creatinine Clearance: 60.8 mL/min (A) (by C-G formula based on SCr of 1.29 mg/dL (H)).  Liver Function Tests: Recent Labs  Lab 02/10/19 0331 02/11/19 0339 02/13/19 0310  AST 55*  --  44*  ALT  558*  --  295*  ALKPHOS 131*  --  114  BILITOT 1.5*  --  1.8*  PROT 5.2*  --  5.5*  ALBUMIN 2.5* 2.7* 2.7*    HbA1C: Hgb A1c MFr Bld  Date/Time Value Ref Range Status  01/19/2019 04:38 PM 7.0 (H) <5.7 % of total Hgb Final    Comment:    For someone without known diabetes, a hemoglobin A1c value of 6.5% or greater indicates that they may have  diabetes and this should be confirmed with a follow-up  test. . For someone with known diabetes, a value <7% indicates  that their diabetes is well controlled and a value  greater than or equal to 7% indicates suboptimal  control. A1c targets should be individualized based on  duration of diabetes, age, comorbid conditions, and  other considerations. . Currently, no consensus exists regarding use of hemoglobin A1c for diagnosis of diabetes for children. .   09/28/2018 09:11 AM 7.2 (H) 4.8 - 5.6 % Final    Comment:    (NOTE) Pre diabetes:          5.7%-6.4% Diabetes:              >6.4% Glycemic control for   <7.0% adults with diabetes     CBG: Recent Labs  Lab 02/12/19 1141 02/12/19 1645 02/12/19 2128 02/13/19 0633 02/13/19 1107  GLUCAP 150* 290* 218* 181* 146*     Scheduled Meds: . aspirin EC  81 mg Oral Daily  . carvedilol  3.125 mg Oral BID WC  . insulin aspart  0-15 Units Subcutaneous TID WC  . insulin glargine  10 Units Subcutaneous QHS  . ipratropium  2 spray Each Nare TID  . mexiletine  150 mg Oral Q8H  . predniSONE  40 mg Oral BID WC  . Ensure Max Protein  11 oz Oral BID  . sacubitril-valsartan  1 tablet Oral BID  . senna-docusate  1 tablet Oral BID  . sodium chloride flush  3 mL Intravenous Q12H  . zolpidem  5 mg Oral QHS     LOS: 12 days   Lee Altes, MD [facility] Office  [phone] Pager - Text Page per Lee Hall  If 7PM-7AM, please contact night-coverage per Amion 02/13/2019, 4:10 PM

## 2019-02-13 NOTE — Progress Notes (Signed)
  Speech Language Pathology Treatment: Dysphagia  Patient Details Name: Lee Hall MRN: [id number] DOB: Jan 24, 1951 Today's Date: 02/13/2019 Time: 8616-8372 SLP Time Calculation (min) (ACUTE ONLY): 13 min  Assessment / Plan / Recommendation Clinical Impression  Pt believes his voice is nearing baseline and that his swallowing is there. He does have throat clearing noted across intake that is seemingly regardless of texture, but it is noted outside of PO intake as well. Pt says that he has this chronically PTA. He has been eating regular textures and drinking thin liquids with no fever, clear lungs, and no subjective complaints. He uses safety precautions with Mod I. Aspiration precautions were reinforced and all questions were answered at this time - SLP to sign off for now.   HPI HPI: 68 year old male with history of CAD, ischemic cardiomyopathy, chronic systolic heart failure admitted 3/5 with septic shock, right upper lobe pneumonia +/- pneumonitis related to immune modulator for his melanoma (nivolaub), brief cardiac arrest in ER.      SLP Plan  All goals met       Recommendations  Diet recommendations: Regular;Thin liquid Liquids provided via: Cup;Straw Medication Administration: Whole meds with liquid Supervision: Patient able to self feed Compensations: Slow rate;Small sips/bites;Minimize environmental distractions Postural Changes and/or Swallow Maneuvers: Seated upright 90 degrees                Oral Care Recommendations: Oral care BID Follow up Recommendations: None SLP Visit Diagnosis: Dysphagia, unspecified (R13.10) Plan: All goals met       GO                Venita Sheffield Marykay Mccleod 02/13/2019, 11:31 AM  Pollyann Glen, M.A. [place] Acute Environmental education officer [phone] Office [phone]

## 2019-02-13 NOTE — Progress Notes (Signed)
Inpatient Diabetes Program Recommendations  AACE/ADA: New Consensus Statement on Inpatient Glycemic Control (2015)  Target Ranges:  Prepandial:   less than 140 mg/dL      Peak postprandial:   less than 180 mg/dL (1-2 hours)      Critically ill patients:  140 - 180 mg/dL   Lab Results  Component Value Date   GLUCAP 181 (H) 02/13/2019   HGBA1C 7.0 (H) 01/19/2019    Review of Glycemic Control  Blood sugars this am 199, 181. Continues on Decadron.  HgbA1C of 7.0% indicates good glycemic control at home.   Inpatient Diabetes Program Recommendations:     Increase Lantus to 12 units QHS Add Novolog 2 units tidwc for meal coverage insulin Add HS correction.  Titrate insulin when steroids are decreasing.  Continue to follow.  Thank you. Lorenda Peck, RD, LDN, CDE Inpatient Diabetes Coordinator [phone]

## 2019-02-13 NOTE — Plan of Care (Signed)
  Problem: Activity: Goal: Ability to tolerate increased activity will improve Outcome: Progressing   Problem: Health Behavior/Discharge Planning: Goal: Ability to manage health-related needs will improve Outcome: Progressing   Problem: Clinical Measurements: Goal: Will remain free from infection Outcome: Progressing   Problem: Nutrition: Goal: Adequate nutrition will be maintained Outcome: Progressing

## 2019-02-14 DIAGNOSIS — N170 Acute kidney failure with tubular necrosis: Secondary | ICD-10-CM

## 2019-02-14 LAB — BASIC METABOLIC PANEL
Anion gap: 11 (ref 5–15)
BUN: 49 mg/dL — ABNORMAL HIGH (ref 8–23)
CHLORIDE: 95 mmol/L — AB (ref 98–111)
CO2: 29 mmol/L (ref 22–32)
CREATININE: 1.38 mg/dL — AB (ref 0.61–1.24)
Calcium: 8.9 mg/dL (ref 8.9–10.3)
GFR calc Af Amer: >60 mL/min (ref >60)
GFR calc non Af Amer: 53 mL/min — ABNORMAL LOW (ref >60)
Glucose, Bld: 377 mg/dL — ABNORMAL HIGH (ref 70–99)
Potassium: 4.6 mmol/L (ref 3.5–5.1)
Sodium: 135 mmol/L (ref 135–145)

## 2019-02-14 LAB — GLUCOSE, CAPILLARY
Glucose-Capillary: 345 mg/dL — ABNORMAL HIGH (ref 70–99)
Glucose-Capillary: 309 mg/dL — ABNORMAL HIGH (ref 70–99)
Glucose-Capillary: 157 mg/dL — ABNORMAL HIGH (ref 70–99)

## 2019-02-14 MED ORDER — BLOOD GLUCOSE METER KIT: PACK | 0 refills | Status: AC

## 2019-02-14 MED ORDER — CARVEDILOL 6.25 MG PO TABS: 3.1250 mg | ORAL_TABLET | Freq: Two times a day (BID) | ORAL | 0 refills | Status: DC

## 2019-02-14 MED ORDER — PREDNISONE 10 MG PO TABS: ORAL_TABLET | ORAL | 0 refills | Status: DC

## 2019-02-14 MED ORDER — AMIODARONE HCL 200 MG PO TABS
200.0000 mg | ORAL_TABLET | Freq: Every day | ORAL | Status: DC
  Administered 2019-02-14: 200 mg via ORAL
  Filled 2019-02-14: qty 1

## 2019-02-14 NOTE — Progress Notes (Signed)
Inpatient Diabetes Program Recommendations  AACE/ADA: New Consensus Statement on Inpatient Glycemic Control (2015)  Target Ranges:  Prepandial:   less than 140 mg/dL      Peak postprandial:   less than 180 mg/dL (1-2 hours)      Critically ill patients:  140 - 180 mg/dL   Lab Results  Component Value Date   GLUCAP 157 (H) 02/14/2019   HGBA1C 7.0 (H) 01/19/2019    Review of Glycemic Control  Spoke with pt and sister about glucose monitoring at home. Pt states he has an old meter and would like a new one. Instructed pt to check blood sugars 2-3x/day and take meter to PCP for review. If blood sugars consistently > 200 mg/dL, call PCP for instruction. Pt voiced understanding.   Thank you. Lorenda Peck, RD, LDN, CDE Inpatient Diabetes Coordinator [phone]

## 2019-02-14 NOTE — Progress Notes (Addendum)
Progress Note  Patient Name: Lee Hall Date of Encounter: 02/14/2019  Primary Cardiologist: Cristopher Peru, MD   Subjective   Pt states that this is his best day yet. He is sitting up on the side of the bed. No chest discomfort, shortness of breath or palpitations.   Inpatient Medications    Scheduled Meds: . aspirin EC  81 mg Oral Daily  . carvedilol  3.125 mg Oral BID WC  . insulin aspart  0-15 Units Subcutaneous TID WC  . insulin glargine  10 Units Subcutaneous QHS  . ipratropium  2 spray Each Nare TID  . mexiletine  150 mg Oral Q8H  . predniSONE  30 mg Oral Q breakfast  . Ensure Max Protein  11 oz Oral BID  . sacubitril-valsartan  1 tablet Oral BID  . senna-docusate  1 tablet Oral BID  . sodium chloride flush  3 mL Intravenous Q12H  . zolpidem  5 mg Oral QHS   Continuous Infusions: . sodium chloride     PRN Meds: sodium chloride, acetaminophen, albuterol, bisacodyl, oxyCODONE, polyethylene glycol, sodium chloride flush, traMADol   Vital Signs    Vitals:   02/13/19 1700 02/13/19 2026 02/14/19 0509 02/14/19 0638  BP:  98/68 (!) 94/56 (!) 104/59  Pulse:  63 60 67  Resp: (!) 21 18 20 20   Temp:  97.8 F (36.6 C) 97.8 F (36.6 C)   TempSrc:  Oral Oral   SpO2:  96% 95%   Weight:   76.3 kg   Height:        Intake/Output Summary (Last 24 hours) at 02/14/2019 0714 Last data filed at 02/14/2019 0555 Gross per 24 hour  Intake 1740 ml  Output 950 ml  Net 790 ml   Last 3 Weights 02/14/2019 02/13/2019 02/12/2019  Weight (lbs) 168 lb 4.8 oz 171 lb 6.4 oz 172 lb 2.9 oz  Weight (kg) 76.34 kg 77.747 kg 78.1 kg      Telemetry    Atrial pacing-60 bpm, rare sinus rhythm in the 70's.  - Personally Reviewed  ECG    No new tracings - Personally Reviewed  Physical Exam   GEN: No acute distress.   Neck: No JVD Cardiac: RRR, no murmurs, rubs, or gallops.  Respiratory: Clear to auscultation bilaterally. GI: Soft, nontender, non-distended  MS: 1+ lower leg  edema, Right arm dependent edema Neuro:  Nonfocal  Psych: Normal affect   Labs    Chemistry Recent Labs  Lab 02/10/19 0331 02/11/19 0339 02/12/19 0242 02/13/19 0310  NA 131* 133* 134* 134*  K 4.1 4.1 4.1 4.7  CL 96* 98 100 100  CO2 26 25 24 26   GLUCOSE 266* 221* 212* 199*  BUN 56* 52* 48* 49*  CREATININE 1.61* 1.36* 1.20 1.29*  CALCIUM 8.1* 8.4* 8.4* 8.7*  PROT 5.2*  --   --  5.5*  ALBUMIN 2.5* 2.7*  --  2.7*  AST 55*  --   --  44*  ALT 558*  --   --  295*  ALKPHOS 131*  --   --  114  BILITOT 1.5*  --   --  1.8*  GFRNONAA 44* 54* >60 58*  GFRAA 51* >60 >60 >60  ANIONGAP 9 10 10 8      Hematology Recent Labs  Lab 02/11/19 0339 02/12/19 0242 02/13/19 0310  WBC 12.7* 9.7 9.7  RBC 5.30 5.06 5.13  HGB 15.4 14.4 15.1  HCT 46.3 44.2 45.3  MCV 87.4 87.4 88.3  MCH 29.1  28.5 29.4  MCHC 33.3 32.6 33.3  RDW 14.2 14.2 14.5  PLT 40* 54* 68*    Cardiac EnzymesNo results for input(s): TROPONINI in the last 168 hours. No results for input(s): TROPIPOC in the last 168 hours.   BNPNo results for input(s): BNP, PROBNP in the last 168 hours.   DDimer No results for input(s): DDIMER in the last 168 hours.   Radiology    No results found.  Cardiac Studies   Echo 02/01/19  1. The left ventricle has severely reduced systolic function, with an ejection fraction of 20-25%. The cavity size was moderately dilated. Left ventricular diastology could not be evaluated due to nondiagnostic images. Elevated left atrial and left  ventricular end-diastolic pressures. 2. There is akinesis of the mid to apical inferior, anterior, lateral, inferolateral, apical and apical septal walls. There is akinesis of the mid anteroseptal and inferoseptal wall. 3. The right ventricle has normal systolic function. The cavity was normal. There is no increase in right ventricular wall thickness. Right ventricular systolic pressure is moderately elevated with an estimated pressure of 55.4 mmHg. 4. Left  atrial size was severely dilated. 5. The mitral valve is normal in structure. There is mild mitral annular calcification present. Mitral valve regurgitation is moderate to severe by color flow Doppler. 6. The tricuspid valve is normal in structure. Tricuspid valve regurgitation is mild-moderate. 7. The aortic valve is tricuspid Moderate thickening of the aortic valve Moderate calcification of the aortic valve. Aortic valve regurgitation is moderate by color flow Doppler. 8. The pulmonic valve was normal in structure. 9. The inferior vena cava was dilated in size with <50% respiratory variability  Patient Profile     68 y.o. male with a hx of ICM s/p ICD/PPM, VT CAD with occluded LAD/collaterals, melanoma,who is being seen for the evaluation of cardiac arrest and + troponinat the request ofDr. Nelda Marseille. Treated for septic shock/respiratory failure.  Assessment & Plan    Acute on chronic systolic CHF -LVEF 16-10%, was 25-35% in 01/2018. Pt has ICD. -Home amiodarone, entresto, coreg, mexitil held in setting of respiratory failure and septic shock. -Pt was diuresed with wt down from max of 205 lbs to 168 lbs. Lasix now stopped.  -Adding his heart failure meds back as tolerated. Carvedilol added back at low dose. Entresto and Mexitil added back yesterday. BP is maintaining in the 90's-100's, which is probably his normal.  -Plan to possibly restart amiodarone- per Dr. Sallyanne Kuster. Plan to increase carvedilol back to home dose 6.25 mg soon.   Acute renal injury -Improved. No labs for today, I ordered  Bmet daily.   CAD -Known underlying CAD including occluded LAD with collaterals. -on aspirin -Troponins mildly elevated, suspected to be related to demand ischemia. No invasive workup planned.  ICD/Hx VT -Low dose carvedilol has been resumed. Mexitil has been resumed.  -No VT on telemetry.  For questions or updates, please contact [place] Please consult [email or web address] for contact  info under        Signed, Daune Perch, NP  02/14/2019, 7:14 AM     I have seen and examined the patient along with Daune Perch, NP.  I have reviewed the chart, notes and new data.  I agree with NP's note.  Key new complaints: feels well Key examination changes: borderline tachycardic, otherwise appears euvolemic Key new findings / data: no meaningful arrhythmia on telemetry  PLAN: Seems to have recovered well from acute critical illness. Restart amiodarone. [facility] will sign off.   Medication  Recommendations:  Increase carvedilol to 6.25 mg BID on Monday 03/23; all other cardiac meds have been restarted as of today. Will also need to resume Jardiance, timing per primary team. Other recommendations (labs, testing, etc):  Repeat CMET in a week or two Follow up as an outpatient:  Will arrange OP Cardiology follow up in 2-3 months.    Sanda Klein, MD, [place] [phone] 02/14/2019, 9:10 AM

## 2019-02-14 NOTE — Progress Notes (Signed)
Physical Therapy Treatment Patient Details Name: Lee Hall MRN: [id number] DOB: December 08, 1950 Today's Date: 02/14/2019    History of Present Illness 68 year old male with history of CAD, ischemic cardiomyopathy, chronic systolic heart failure admitted 3/5 with septic shock, right upper lobe pneumonia +/- pneumonitis related to immune modulator for his melanoma (nivolaub), brief cardiac arrest in ER. Intubated 3/10-3/11.    PT Comments    Pt received up in room, walking to the bathroom without AD, reaching out to balance on furniture. Educated pt that his mobility and safety are improved with use of RW, pt verbalizes understanding and says he will use RW for safety. Progressed mobility today (see details below). Discussed HHPT with pt. Pt states that he has good family support at home, and feels safe going home.  Pt will continue to benefit from skilled acute PT services in order to progress mobility safely, and will benefit from HHPT to assess his safety and improve his independence in the home.     Follow Up Recommendations  Home health PT;Supervision for mobility/OOB     Equipment Recommendations  Rolling walker with 5" wheels    Recommendations for Other Services       Precautions / Restrictions Precautions Precautions: Fall Restrictions Weight Bearing Restrictions: No    Mobility  Bed Mobility               General bed mobility comments: Pt received up in room, walking to bathroom  Transfers Overall transfer level: Needs assistance Equipment used: Rolling walker (2 wheeled) Transfers: Sit to/from Stand Sit to Stand: Supervision         General transfer comment: Supervision for safety  Ambulation/Gait Ambulation/Gait assistance: Min guard Gait Distance (Feet): 300 Feet Assistive device: Rolling walker (2 wheeled) Gait Pattern/deviations: Step-through pattern;Decreased stride length Gait velocity: decreased   General Gait Details: Pt received  up in room, walking to bathroom without AD, using furniture for balance. Ambulated in hallway with RW, approximately 300 ft with RW, min guard for safety, much more steady with RW. No SOB, able to carry on conversation while walking, HR 65-77bpm.   Stairs             Wheelchair Mobility    Modified Rankin (Stroke Patients Only)       Balance Overall balance assessment: Needs assistance Sitting-balance support: Feet supported;No upper extremity supported Sitting balance-Leahy Scale: Good     Standing balance support: Bilateral upper extremity supported;During functional activity;No upper extremity supported Standing balance-Leahy Scale: Fair Standing balance comment: Able to stand without LOB but much more steady ambulating with RW                            Cognition Arousal/Alertness: Awake/alert Behavior During Therapy: Flat affect Overall Cognitive Status: Within Functional Limits for tasks assessed                                        Exercises      General Comments        Pertinent Vitals/Pain Pain Assessment: No/denies pain    Home Living                      Prior Function            PT Goals (current goals can now be found in the care plan section)  Acute Rehab PT Goals Patient Stated Goal: to go home and get PT in the home PT Goal Formulation: With patient Time For Goal Achievement: 02/22/19 Potential to Achieve Goals: Good Progress towards PT goals: Progressing toward goals    Frequency    Min 3X/week      PT Plan Current plan remains appropriate    Co-evaluation              AM-PAC PT "6 Clicks" Mobility   Outcome Measure  Help needed turning from your back to your side while in a flat bed without using bedrails?: A Little Help needed moving from lying on your back to sitting on the side of a flat bed without using bedrails?: A Little Help needed moving to and from a bed to a chair  (including a wheelchair)?: None Help needed standing up from a chair using your arms (e.g., wheelchair or bedside chair)?: None Help needed to walk in hospital room?: A Little Help needed climbing 3-5 steps with a railing? : A Little 6 Click Score: 20    End of Session Equipment Utilized During Treatment: Gait belt Activity Tolerance: Patient tolerated treatment well Patient left: in chair;with call bell/phone within reach   PT Visit Diagnosis: Unsteadiness on feet (R26.81)     Time:  -     Charges:                        Ronnell Guadalajara, SPT    Ronnell Guadalajara 02/14/2019, 9:25 AM

## 2019-02-14 NOTE — Discharge Instructions (Signed)

## 2019-02-14 NOTE — Discharge Summary (Addendum)
Physician Discharge Summary  Lee Hall ZOX:[id number] DOB: 1951-09-26  PCP: [name], MD  Admit date: 02/01/2019 Discharge date: 02/14/2019  Recommendations for Outpatient Follow-up:  1. Dr. Micheline Rough, PCP in 1 week with repeat labs (CBC & CMP). 2. Dr. [name], EP Cardiology on 04/30/2019 at 10:30 AM. 3. Dr Zola Button, Oncology: Office will call patient with appointment to be seen in the next 2 weeks. Johnson, NP/[facility] on 02/22/2019 at 9 AM. 5. Recommend repeating chest x-ray in 4 weeks to ensure resolution of abnormal findings.  Home Health: PT and OT Equipment/Devices: Rolling walker and 3 n 1  Discharge Condition: Improved and stable CODE STATUS: Full Diet recommendation: Heart healthy & diabetic diet.  Discharge Diagnoses:  Active Problems:   Septic shock (HCC)   Acute hypoxemic respiratory failure (HCC)   Cardiac arrest ([place])   Community acquired pneumonia of right lung   Severe sepsis (HCC)   Coronary artery disease involving native coronary artery of native heart without angina pectoris   Chronic systolic congestive heart failure (HCC)   S/P placement of cardiac pacemaker   Atrial fibrillation (HCC)   Steroid-induced hyperglycemia   History of sustained ventricular tachycardia   Brief Summary: 68 year old male, lives alone, independent, PMH of CAD, ischemic cardiomyopathy with chronic systolic CHF/LVEF 81%, VT status post ICD/PPM on amiodarone, melanoma on immunotherapy (nivolaub) presented with dyspnea, leg swelling and hypotension.  He had brief cardiac arrest in ER.  He was found to have septic shock from RUL pneumonia.  CCM admitted to ICU.  After stabilization, he was transferred to [facility] and floor on 02/09/2019.  Cardiology consulted.  Assessment and plan:  Septic shock from pneumonia Was recently on antibiotics as outpatient.  Has been on immunomodulatory therapy for melanoma.  Completed 7 days of  vancomycin/cefepime and off of antibiotics since 3/11.  Off of pressors now.  Acute respiratory failure with hypoxia/pneumonia versus pneumonitis Likely related to pneumonia but other possibilities from immunotherapy induced pneumonitis.  Extubated.  Hypoxia resolved.  Completed diuresis for decompensated CHF.  IV Solu-Medrol was transitioned to oral prednisone few days ago.  Completed course of antibiotics. Current plans are for slow prednisone taper due to possibility of immunotherapy related pneumonitis.  Prednisone will be tapered by 10 mg weekly.  Prior to completion of prednisone taper, patient will follow-up with pulmonology and oncology as outpatient.  Acute renal failure from ATN in the setting of hypoxia and septic shock/acute urinary retention Baseline creatinine 1.07 from 01/19/2019.  Creatinine had peaked to 2.55.  This had gradually improved to 1.2 after initiation of diuretics.  Subsequently has gone up slightly to 1.38 on day of discharge, likely related to diuresis.  Diuretics held.  As discussed with cardiology, no diuretics at discharge.  Needs close follow-up of BMP as outpatient.  Foley catheter has been removed and patient voiding without difficulty.  Elevated LFTs likely from shock liver LFTs continue to improve.  Continue to hold statins at discharge.  Close outpatient follow-up with repeat LFTs and resume statins when able.  Cardiac arrest in the setting of respiratory failure and septic shock Cardiology evaluated during this admission.  History of CAD, elevated troponin, ischemic cardiomyopathy, acute on chronic systolic CHF, VT and valvular heart disease Elevated troponin felt to be due to demand ischemia.  Patient has known occluded LAD with collaterals. EF 20-25%.  Has ICD/PPM.  -14.53 L thus far. Cardiology has seen today and signed off.  Cardiac medications have been slowly resumed  given soft BPs.  I discussed with cardiology and have cleared him for discharge home and  will arrange outpatient follow-up.  No diuretics recommended at discharge.  Resumption of Jardiance will help with some diuresis. Moderate/severe mitral regurgitation and moderate aortic valve regurgitation noted on TTE 02/01/2019  Type II DM with hyperglycemia Hyperglycemia in the hospital likely precipitated by steroids.  He was on prednisone 40 mg twice daily until yesterday and has been reduced to prednisone 30 mg daily today.  A1c 7 on 01/19/2019 suggests fairly good outpatient control.  Hopefully with reduction of prednisone dose and gradual taper as outpatient, his glycemic control should continue to improve.  Resumed home dose of metformin and Jardiance at discharge and medications can be adjusted during close outpatient follow-up with PCP.  Acute metabolic encephalopathy Multifactorial due to acute illness noted above.  Resolved.  Thrombocytopenia No evidence of HIT.  Held Lovenox/heparin.  Likely due to sepsis alone.  Platelet count slowly improving.  Recommend close outpatient follow-up with CBC.  No bleeding reported.  T3, N3 melanoma right shoulder diagnosed January 2019 Follows with Dr. Alen Blew.  Has been on nivolumab since January 2020, currently on hold and not on his medication list.  I discussed with Dr. Alen Blew who will arrange close outpatient follow-up.    Consultations:  PCCM  Cardiology  Procedures: ETT 3/05 >>3/8 ETT 3/9>>> Rt IJ CVL 3/05 >> Lt radial aline 3/05 >> 3/07 2D echo 3/5 >> EF 20 to 25%, mild/mod MR, mild/mod TR, mod AR   Discharge Instructions  Discharge Instructions    (HEART FAILURE PATIENTS) Call MD:  Anytime you have any of the following symptoms: 1) 3 pound weight gain in 24 hours or 5 pounds in 1 week 2) shortness of breath, with or without a dry hacking cough 3) swelling in the hands, feet or stomach 4) if you have to sleep on extra pillows at night in order to breathe.   Complete by:  As directed    Call MD for:  difficulty breathing,  headache or visual disturbances   Complete by:  As directed    Call MD for:  extreme fatigue   Complete by:  As directed    Call MD for:  persistant dizziness or light-headedness   Complete by:  As directed    Call MD for:  persistant nausea and vomiting   Complete by:  As directed    Call MD for:  severe uncontrolled pain   Complete by:  As directed    Call MD for:  temperature >100.4   Complete by:  As directed    Diet - low sodium heart healthy   Complete by:  As directed    Diet Carb Modified   Complete by:  As directed    Increase activity slowly   Complete by:  As directed        Medication List    STOP taking these medications   levofloxacin 750 MG tablet Commonly known as:  LEVAQUIN   rosuvastatin 10 MG tablet Commonly known as:  CRESTOR     TAKE these medications   acyclovir 800 MG tablet Commonly known as:  ZOVIRAX Take 400 mg by mouth every other day.   albuterol 108 (90 Base) MCG/ACT inhaler Commonly known as:  PROVENTIL HFA;VENTOLIN HFA Inhale 1-2 puffs into the lungs every 6 (six) hours as needed for wheezing or shortness of breath.   amiodarone 200 MG tablet Commonly known as:  PACERONE Take 1 tablet (200 mg total)  by mouth daily.   aspirin 81 MG tablet Take 81 mg by mouth daily.   blood glucose meter kit and supplies Dispense based on patient and insurance preference. Use up to four times daily as directed. (FOR ICD-10 E10.9, E11.9).   carvedilol 6.25 MG tablet Commonly known as:  COREG Take 0.5 tablets (3.125 mg total) by mouth 2 (two) times daily with a meal. On 02/19/2019 increase to 1 tab (6.25 mg total) twice daily. What changed:    how much to take  additional instructions   E-Z Spacer inhaler Use as instructed   empagliflozin 10 MG Tabs tablet Commonly known as:  Jardiance Take 10 mg by mouth daily.   ipratropium 0.06 % nasal spray Commonly known as:  ATROVENT Place 2 sprays into both nostrils 3 (three) times daily.    metFORMIN 500 MG 24 hr tablet Commonly known as:  GLUCOPHAGE-XR Take 500 mg by mouth 2 (two) times daily.   mexiletine 150 MG capsule Commonly known as:  MEXITIL Take 1 capsule (150 mg total) by mouth 3 (three) times daily.   predniSONE 10 MG tablet Commonly known as:  DELTASONE Take 3 tabs daily for 1 week, then 2 tabs daily for 1 week, then 1 tab daily for 1 week, then stop.   sacubitril-valsartan 24-26 MG Commonly known as:  Entresto Take 1 tablet by mouth 2 (two) times daily.      Follow-up Information    Evans Lance, MD Follow up.   Specialty:  Cardiology Why:  Cardiology follow up on 04/30/2019 at 10:30. Please arrive 15 minutes early for check in.  Contact information: [address] [place] [address] [facility] Follow up.   Specialty:  Home Health Services Why:  For home health services, they will call you in 1-2 days to set up your first at home visit. A rolling walker and three in one device will be delivered to your room prior to discharge.        [name], MD. Schedule an appointment as soon as possible for a visit in 1 week(s).   Specialty:  Family Medicine Why:  To be seen with repeat labs (CBC & CMP). Contact information: [place] [address] [phone]        Wyatt Portela, MD Follow up.   Specialty:  Oncology Why:  Office will call with follow up appointment to be seen in the next 2 weeks. Contact information: [place] [address] [phone]        Lauraine Rinne, NP Follow up on 02/22/2019.   Specialty:  Pulmonary Disease Why:  9 am. Contact information: [place] [address] [phone]          No Known Allergies    Procedures/Studies: Dg Chest 1 View  Result Date: 02/05/2019 CLINICAL DATA:  Respiratory difficulty. EXAM: CHEST  1 VIEW COMPARISON:  Earlier same day  FINDINGS: Right internal jugular central line tip in the SVC above the right atrium. Pacemaker/AICD appears the same. Slight worsening of bilateral interstitial and alveolar edema right worse than left. Probable small effusions. Coexistent pneumonia could certainly be present. IMPRESSION: Slight radiographic worsening since earlier today. Interstitial and alveolar density most consistent with edema. Coexistent pneumonia not excluded. Electronically Signed   By: Nelson Chimes M.D.   On: 02/05/2019 19:18   Dg Chest 2 View  Result  Date: 01/31/2019 CLINICAL DATA:  Cough EXAM: CHEST - 2 VIEW COMPARISON:  01/19/2019 FINDINGS: Airspace disease throughout the right lung worsened. Increased linear markings at the left lung base. Small bilateral pleural effusions. Left subclavian AICD device. Borderline cardiomegaly. No pneumothorax. IMPRESSION: Worsening airspace disease at the right base. Increasing atelectasis versus airspace disease at the left base. Small bilateral pleural effusions. Electronically Signed   By: Marybelle Killings M.D.   On: 01/31/2019 15:53   Dg Chest 2 View  Result Date: 01/19/2019 CLINICAL DATA:  Cough EXAM: CHEST - 2 VIEW COMPARISON:  November 26, 2008 FINDINGS: Cardiac pacemaker is identified. Mediastinal contour and cardiac silhouette are otherwise normal. There is consolidation of the inferior right upper lobe. The left lung is clear. There is chronic elevation of right hemidiaphragm. Minimal right pleural effusion is identified. The visualized skeletal structures are stable. IMPRESSION: Right upper lobe pneumonia with minimal right pleural effusion. Electronically Signed   By: Abelardo Diesel M.D.   On: 01/19/2019 16:43   Dg Chest Port 1 View  Result Date: 02/07/2019 CLINICAL DATA:  History of ETT. EXAM: PORTABLE CHEST 1 VIEW COMPARISON:  02/06/2019. FINDINGS: Unchanged tubes and lines. ET tube 5.5 cm above carina. Multi lead defibrillator appears stable. No change in mild RIGHT-sided airspace  opacities. Retrocardiac density likely atelectasis persists. Small LEFT effusion. IMPRESSION: Stable aeration. Electronically Signed   By: Staci Righter M.D.   On: 02/07/2019 07:40   Portable Chest X-ray  Result Date: 02/06/2019 CLINICAL DATA:  Intubated patient.  Airspace disease. EXAM: PORTABLE CHEST 1 VIEW COMPARISON:  Radiographs 02/05/2019 and 02/06/2019. FINDINGS: 1607 hours. Tip of the endotracheal tube is unchanged in the mid trachea. Enteric tube projects below the diaphragm, tip not visualized. Left subclavian AICD leads appear unchanged. Multiple other tubes and lines overlie the chest. 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 The right-greater-than-left airspace opacities appear minimally improved. There are probable small bilateral pleural effusions. No pneumothorax. IMPRESSION: 1. Stable support system and lines. 2. Minimal improvement in right-greater-than-left airspace opacities. Persistent bilateral pleural effusions. Electronically Signed   By: Richardean Sale M.D.   On: 02/06/2019 16:24   Portable Chest X-ray  Result Date: 02/06/2019 CLINICAL DATA:  Intubation. EXAM: PORTABLE CHEST 1 VIEW COMPARISON:  Chest radiograph February 05, 2019 at 1902 hours. FINDINGS: Endotracheal tube tip projects 7 cm above the carina. Nasogastric tube past proximal stomach, distal tip out of field of view. RIGHT internal jugular central venous catheter distal tip projects in mid superior vena cava. No pneumothorax. Interstitial and alveolar airspace opacities similar to prior examination. Retrocardiac air bronchograms. Small pleural effusions. Cardiac silhouette is mildly enlarged. Mediastinal silhouette is not suspicious. LEFT AICD in situ. Soft tissue planes included osseous structures are unchanged, surgical clips RIGHT axilla. IMPRESSION: 1. Endotracheal tube tip projects 7 cm above the carina. Nasogastric tube past proximal stomach. RIGHT internal  jugular central venous catheter distal tip projects in mid superior vena cava. 2. Similar interstitial and alveolar airspace opacities seen with pulmonary edema and/or pneumonia. Small pleural effusions. 3. Mild cardiomegaly. Electronically Signed   By: Elon Alas M.D.   On: 02/06/2019 00:26   Dg Chest Port 1 View  Result Date: 02/05/2019 CLINICAL DATA:  Respiratory failure EXAM: PORTABLE CHEST 1 VIEW COMPARISON:  02/04/2019 FINDINGS: Chronic cardiomegaly. Dual-chamber ICD/pacer leads from the left. Right IJ line with tip at the SVC. Interval extubation. Asymmetric hazy opacity in the right lung which could be infection or  asymmetric edema. There is generalized interstitial coarsening and pleural fluid. No pneumothorax. IMPRESSION: Probable inferior flow pleural fluid. Otherwise stable chest with asymmetric interstitial and airspace disease. Electronically Signed   By: Monte Fantasia M.D.   On: 02/05/2019 07:25   Dg Chest Port 1 View  Result Date: 02/04/2019 CLINICAL DATA:  Respiratory failure EXAM: PORTABLE CHEST 1 VIEW COMPARISON:  02/03/2019 FINDINGS: Cardiac shadow is enlarged but stable. Defibrillator is again noted. Right jugular central line, endotracheal tube and gastric catheter are noted. Persistent right mid lung opacification is noted. Diffuse increased density is noted on the right consistent with posteriorly layering effusion. Left retrocardiac density is noted and stable. No new focal abnormality is seen. IMPRESSION: Overall stable appearance of the chest with bilateral opacities and posteriorly layering right effusion. Electronically Signed   By: Inez Catalina M.D.   On: 02/04/2019 07:21   Dg Chest Port 1 View  Result Date: 02/03/2019 CLINICAL DATA:  Respiratory failure EXAM: PORTABLE CHEST 1 VIEW COMPARISON:  02/02/2019 FINDINGS: Endotracheal tube terminates 6.7 cm above carina. Right internal jugular line tip not well visualized. Pacer/AICD device. Right axillary node dissection.  Cardiomegaly accentuated by AP portable technique. Nasogastric tube extends beyond the inferior aspect of the film. Slight worsening in right greater than left airspace disease. Underlying prominence of the pulmonary interstitium may be slightly increased. Probable small bilateral pleural effusions. No pneumothorax. IMPRESSION: Slightly worsened aeration, likely due to a combination of congestive heart failure and atelectasis or infection. Similar small bilateral pleural effusions. Electronically Signed   By: Abigail Miyamoto M.D.   On: 02/03/2019 08:10   Dg Chest Port 1 View  Result Date: 02/02/2019 CLINICAL DATA:  Respiratory failure EXAM: PORTABLE CHEST 1 VIEW COMPARISON:  02/01/2019 FINDINGS: Left AICD, endotracheal tube, right central line and NG tube remain in place, unchanged. Cardiomegaly. Bilateral airspace disease again noted, right greater than left. Small bilateral effusions. No acute bony abnormality. IMPRESSION: Stable bilateral airspace disease, right greater than left and small effusions. Electronically Signed   By: Rolm Baptise M.D.   On: 02/02/2019 07:28   Dg Chest Port 1 View  Result Date: 02/01/2019 CLINICAL DATA:  Follow-up right jugular central line placement EXAM: PORTABLE CHEST 1 VIEW COMPARISON:  02/01/2019 FINDINGS: Endotracheal tube and gastric catheter are seen in satisfactory position. Defibrillator is again noted. Calcific changes along the cardiac margin are seen. Right jugular central line is noted without evidence of pneumothorax. Persistent opacity in the right lung is noted and stable. Increasing consolidation in the left retrocardiac region is noted. IMPRESSION: Status post gastric catheter placement and right jugular central line without evidence of pneumothorax. Stable airspace opacity within the right lung. Increasing consolidation left retrocardiac region is noted. Electronically Signed   By: Inez Catalina M.D.   On: 02/01/2019 11:30   Dg Chest Portable 1 View  Result  Date: 02/01/2019 CLINICAL DATA:  Endotracheal tube placement EXAM: PORTABLE CHEST 1 VIEW COMPARISON:  Earlier today FINDINGS: New endotracheal tube with tip at the clavicular heads. Dual-chamber pacer leads from the left in stable position. Unchanged diffuse interstitial opacity with asymmetric airspace disease on the right. Cardiomegaly. IMPRESSION: 1. New endotracheal tube with tip at the clavicular heads. 2. Unchanged interstitial and airspace disease compared to earlier today. Electronically Signed   By: Monte Fantasia M.D.   On: 02/01/2019 08:37   Dg Chest Port 1 View  Result Date: 02/01/2019 CLINICAL DATA:  Shortness of breath EXAM: PORTABLE CHEST 1 VIEW COMPARISON:  Yesterday FINDINGS: Asymmetric right-sided  airspace opacity. No lung opacity on PET-CT 09/04/2018. Small pleural effusions greater on the right. Diffuse interstitial coarsening with [employer]. Chronic cardiomegaly with dual-chamber pacer leads. There is duplicated right ventricular ICD leads. IMPRESSION: 1. Pulmonary edema and small right pleural effusion. 2. Asymmetric edema versus pneumonia on the right that has progressed from prior. Electronically Signed   By: Monte Fantasia M.D.   On: 02/01/2019 06:36      Subjective: Patient anxious to go home.  States that he normally lives alone but plans to return and stay with his sister and brother-in-law for a few days until he recuperates.  Some urinary urgency after Foley catheter removal without dysuria, fever or chills.  No dyspnea, chest pain, dizziness or lightheadedness reported.  Discharge Exam:  Vitals:   02/13/19 2026 02/14/19 0509 02/14/19 0638 02/14/19 1213  BP: 98/68 (!) 94/56 (!) 104/59 99/65  Pulse: 63 60 67 67  Resp: '18 20 20 18  ' Temp: 97.8 F (36.6 C) 97.8 F (36.6 C)  (!) 97.5 F (36.4 C)  TempSrc: Oral Oral  Oral  SpO2: 96% 95%    Weight:  76.3 kg    Height:        General: Pleasant middle-age male, moderately built and nourished, initially seen  sitting up comfortably in the bed and subsequently seen ambulating comfortably with PT assistance. Cardiovascular: S1 & S2 heard, RRR, S1/S2 +. No murmurs, rubs, gallops or clicks. No JVD.  2+ pitting bilateral leg edema and trace bilateral upper extremity edema.  Telemetry personally reviewed: A paced rhythm/BBB morphology. Respiratory: Clear to auscultation without wheezing, rhonchi or crackles. No increased work of breathing. Abdominal:  Non distended, non tender & soft. No organomegaly or masses appreciated. Normal bowel sounds heard. CNS: Alert and oriented. No focal deficits. Extremities: no edema, no cyanosis.  Symmetric 5 x 5 power.  Patient has superficial skin tear over dorsum of right upper extremity without acute findings.    The results of significant diagnostics from this hospitalization (including imaging, microbiology, ancillary and laboratory) are listed below for reference.       Labs: CBC: Recent Labs  Lab 02/09/19 0258 02/10/19 0331 02/11/19 0339 02/12/19 0242 02/13/19 0310  WBC 11.9* 11.9* 12.7* 9.7 9.7  HGB 14.0 14.3 15.4 14.4 15.1  HCT 41.7 42.2 46.3 44.2 45.3  MCV 86.9 85.6 87.4 87.4 88.3  PLT 36* 35* 40* 54* 68*   Basic Metabolic Panel: Recent Labs  Lab 02/09/19 0258 02/10/19 0331 02/11/19 0339 02/12/19 0242 02/13/19 0310 02/14/19 0729  NA 133* 131* 133* 134* 134* 135  K 3.6 4.1 4.1 4.1 4.7 4.6  CL 94* 96* 98 100 100 95*  CO2 '30 26 25 24 26 29  ' GLUCOSE 243* 266* 221* 212* 199* 377*  BUN 64* 56* 52* 48* 49* 49*  CREATININE 1.54* 1.61* 1.36* 1.20 1.29* 1.38*  CALCIUM 8.2* 8.1* 8.4* 8.4* 8.7* 8.9  MG 2.4 2.4  --  2.2 2.2  --   PHOS 4.6 3.8 3.7  --   --   --    Liver Function Tests: Recent Labs  Lab 02/10/19 0331 02/11/19 0339 02/13/19 0310  AST 55*  --  44*  ALT 558*  --  295*  ALKPHOS 131*  --  114  BILITOT 1.5*  --  1.8*  PROT 5.2*  --  5.5*  ALBUMIN 2.5* 2.7* 2.7*   BNP (last 3 results) Recent Labs    01/19/19 1638  BNP 782*    CBG: Recent Labs  Lab 02/13/19 1107 02/13/19 1603 02/13/19 2150 02/14/19 0633 02/14/19 1104  GLUCAP 146* 345* 113* 309* 157*   Urinalysis    Component Value Date/Time   COLORURINE AMBER (A) 02/02/2019 0604   APPEARANCEUR CLOUDY (A) 02/02/2019 0604   LABSPEC 1.024 02/02/2019 0604   PHURINE 5.0 02/02/2019 0604   GLUCOSEU >=500 (A) 02/02/2019 0604   HGBUR MODERATE (A) 02/02/2019 0604   BILIRUBINUR NEGATIVE 02/02/2019 0604   KETONESUR NEGATIVE 02/02/2019 0604   PROTEINUR 100 (A) 02/02/2019 0604   NITRITE NEGATIVE 02/02/2019 0604   LEUKOCYTESUR NEGATIVE 02/02/2019 0604      Time coordinating discharge: 50 minutes  SIGNED:  Vernell Leep, MD, FACP, [facility]. [facility]  To contact the attending provider between 7A-7P or the covering provider during after hours 7P-7A, please log into the web site [email or web address] and access using universal [place] password for that web site. If you do not have the password, please call the hospital operator.

## 2019-02-14 NOTE — TOC Transition Note (Signed)
Transition of Care [facility]) - CM/SW Discharge Note   Patient Details  Name: Lee Hall MRN: [id number] Date of Birth: 09-May-1951  [facility] CM/SW Contact:  Carles Collet, RN Phone Number: 02/14/2019, 10:15 AM   Clinical Narrative:   Notified [facility] of referral for [facility] services. Notiied [facility] of DME needs. RW and 3/1 to be delivered to room prior to DC  Expected Discharge Plan and Services Barriers to Discharge: No Barriers Identified Expected Discharge Plan: [place] Discharge Planning Services: CM Consult Post Acute Care Choice: Home Health, Durable Medical Equipment Living arrangements for the past 2 months: Single Family Home                 DME Arranged: 3-N-1, Walker rolling DME Agency: [facility] HH Arranged: PT, OT HH Agency: [place] ([facility])   Final next level of care: [place] Barriers to Discharge: No Barriers Identified   Patient Goals and CMS Choice Patient states their goals for this hospitalization and ongoing recovery are:: return home   Choice offered to / list presented to : Patient  Discharge Placement                       Discharge Plan and Services Discharge Planning Services: CM Consult Post Acute Care Choice: Home Health, Durable Medical Equipment          DME Arranged: 3-N-1, Walker rolling DME Agency: [facility] HH Arranged: PT, OT HH Agency: [name] ([facility])   Social Determinants of Health (SDOH) Interventions     Readmission Risk Interventions No flowsheet data found.

## 2019-02-15 ENCOUNTER — Telehealth: Payer: Self-pay | Admitting: *Deleted

## 2019-02-15 DIAGNOSIS — I4891 Unspecified atrial fibrillation: Secondary | ICD-10-CM | POA: Diagnosis not present

## 2019-02-15 DIAGNOSIS — Z7982 Long term (current) use of aspirin: Secondary | ICD-10-CM | POA: Diagnosis not present

## 2019-02-15 DIAGNOSIS — Z8701 Personal history of pneumonia (recurrent): Secondary | ICD-10-CM | POA: Diagnosis not present

## 2019-02-15 DIAGNOSIS — I2510 Atherosclerotic heart disease of native coronary artery without angina pectoris: Secondary | ICD-10-CM | POA: Diagnosis not present

## 2019-02-15 DIAGNOSIS — E1165 Type 2 diabetes mellitus with hyperglycemia: Secondary | ICD-10-CM | POA: Diagnosis not present

## 2019-02-15 DIAGNOSIS — J9601 Acute respiratory failure with hypoxia: Secondary | ICD-10-CM | POA: Diagnosis not present

## 2019-02-15 DIAGNOSIS — Z950 Presence of cardiac pacemaker: Secondary | ICD-10-CM | POA: Diagnosis not present

## 2019-02-15 DIAGNOSIS — I5023 Acute on chronic systolic (congestive) heart failure: Secondary | ICD-10-CM | POA: Diagnosis not present

## 2019-02-15 DIAGNOSIS — Z7984 Long term (current) use of oral hypoglycemic drugs: Secondary | ICD-10-CM | POA: Diagnosis not present

## 2019-02-15 NOTE — Telephone Encounter (Signed)
Transition Care Management Follow-up Telephone Call   Date discharged? 02/14/2019   How have you been since you were released from the hospital? As well as can be expected; its not bad    Do you understand why you were in the hospital? yes   Do you understand the discharge instructions? yes   Where were you discharged to? Home    Items Reviewed:  Medications reviewed: yes  Allergies reviewed: N/A   Dietary changes reviewed: N/A   Referrals reviewed: yes   Functional Questionnaire:   Activities of Daily Living (ADLs):   He states they are independent in the following: ambulation, bathing and hygiene, feeding, continence, grooming, toileting and dressing States they require assistance with the following: N/A   Any transportation issues/concerns?: no  Any patient concerns? Yes (Patient wants to know should he continue to use Albuterol inhaler or discontinue )   Confirmed importance and date/time of follow-up visits scheduled yes  Provider Appointment booked with Dr. Ethlyn Gallery 02/21/2019 at 10:00 AM   Confirmed with patient if condition begins to worsen call PCP or go to the ER.  Patient was given the office number and encouraged to call back with question or concerns.  : yes Questions for Screening COVID-19  Symptom onset: N/A   Travel or Contacts: N/A   During this illness, did/does the patient experience any of the following symptoms? Fever >100.96F []   Yes [x]   No []   Unknown Subjective fever (felt feverish) []   Yes [x]   No []   Unknown Chills []   Yes [x]   No []   Unknown Muscle aches (myalgia) []   Yes [x]   No []   Unknown Runny nose (rhinorrhea) []   Yes [x]   No []   Unknown Sore throat [x]   Yes []   No []   Unknown  ( from et tube) Cough (new onset or worsening of chronic cough) []   Yes [x]   No []   Unknown Shortness of breath (dyspnea) []   Yes [x]   No []   Unknown Nausea or vomiting []   Yes []   No []   Unknown Headache []   Yes []   No [x]   Unknown Abdominal pain   []   Yes []   No []   Unknown Diarrhea (?3 loose/looser than normal stools/24hr period) []   Yes [x]   No []   Unknown Other, specify:  Patient risk factors: Smoker? []   Current [x]   Former []   Never If male, currently pregnant? []   Yes []   No  Patient Active Problem List   Diagnosis Date Noted  . Coronary artery disease involving native coronary artery of native heart without angina pectoris   . Chronic systolic congestive heart failure ([place])   . S/P placement of cardiac pacemaker   . Atrial fibrillation ([place])   . Steroid-induced hyperglycemia   . History of sustained ventricular tachycardia   . Acute hypoxemic respiratory failure ([place])   . Cardiac arrest ([place])   . Community acquired pneumonia of right lung   . Severe sepsis ([place])   . Septic shock ([place]) 02/01/2019  . Malignant melanoma metastatic to lymph node ([place]) 10/05/2018  . Tremor of right hand 08/07/2018  . Melanoma of skin ([place]) 08/07/2018  . Abnormal positron emission tomography (PET) scan 08/07/2018  . ISCHEMIC CARDIOMYOPATHY 03/19/2009  . PAROXYSMAL VENTRICULAR TACHYCARDIA 03/19/2009  . CAD, NATIVE VESSEL 02/18/2009  . LEFT VENTRICULAR MURAL THROMBUS 02/18/2009  . Chronic systolic heart failure ([place]) 01/02/2009  . TACHYCARDIA 01/02/2009  . Implantable cardioverter-defibrillator (ICD) in situ 01/02/2009    Plan:  []   High  risk for COVID-19 with red flags go to ED (with CP, SOB, weak/lightheaded, or fever > 101.5). Call ahead.  []   High risk for COVID-19 but stable will have car visit. Inform provider and coordinate time. Will be completed in afternoon. [x]   No red flags but URI signs or symptoms will go through side door and be seen in dedicated room.  Note: Referral to telemedicine is an appropriate alternative disposition for higher risk but stable. [facility]/e-Visit: [phone].

## 2019-02-16 ENCOUNTER — Encounter: Payer: Self-pay | Admitting: Family Medicine

## 2019-02-18 ENCOUNTER — Encounter: Payer: Self-pay | Admitting: Family Medicine

## 2019-02-18 ENCOUNTER — Encounter: Payer: Self-pay | Admitting: Oncology

## 2019-02-19 ENCOUNTER — Other Ambulatory Visit: Payer: Self-pay | Admitting: Family Medicine

## 2019-02-19 ENCOUNTER — Telehealth: Payer: Self-pay

## 2019-02-19 DIAGNOSIS — J9601 Acute respiratory failure with hypoxia: Secondary | ICD-10-CM | POA: Diagnosis not present

## 2019-02-19 DIAGNOSIS — I2510 Atherosclerotic heart disease of native coronary artery without angina pectoris: Secondary | ICD-10-CM | POA: Diagnosis not present

## 2019-02-19 DIAGNOSIS — Z7982 Long term (current) use of aspirin: Secondary | ICD-10-CM | POA: Diagnosis not present

## 2019-02-19 DIAGNOSIS — Z7984 Long term (current) use of oral hypoglycemic drugs: Secondary | ICD-10-CM | POA: Diagnosis not present

## 2019-02-19 DIAGNOSIS — Z950 Presence of cardiac pacemaker: Secondary | ICD-10-CM | POA: Diagnosis not present

## 2019-02-19 DIAGNOSIS — I4891 Unspecified atrial fibrillation: Secondary | ICD-10-CM | POA: Diagnosis not present

## 2019-02-19 DIAGNOSIS — E1165 Type 2 diabetes mellitus with hyperglycemia: Secondary | ICD-10-CM | POA: Diagnosis not present

## 2019-02-19 DIAGNOSIS — I5023 Acute on chronic systolic (congestive) heart failure: Secondary | ICD-10-CM | POA: Diagnosis not present

## 2019-02-19 DIAGNOSIS — Z8701 Personal history of pneumonia (recurrent): Secondary | ICD-10-CM | POA: Diagnosis not present

## 2019-02-19 MED ORDER — FUROSEMIDE 20 MG PO TABS: 20.0000 mg | ORAL_TABLET | Freq: Every day | ORAL | 0 refills | Status: DC | PRN

## 2019-02-19 NOTE — Telephone Encounter (Signed)
I sent over lasix for him to take daily as needed. HOWEVER, his kidney function has been strained with his illness, and we need to use caution with this medication as it filters through kidneys. I would advise he use sparingly. Try to get those legs above heart level while sitting/sleeping. OK to take the lasix today, tomorrow. Make sure low sodium diet. Can also discuss this issue with cardiology since they approved discharge without diuresis and see what they recommend.   Secondly; please work to convert him to webex visit on either weds or Friday. I really do NOT want him coming into office for eval. He will be seeing cardio and pulm and they will do hands on evaluation; so really we can review everything else by webex and decrease his exposure to additional illness.

## 2019-02-20 ENCOUNTER — Telehealth: Payer: Self-pay | Admitting: *Deleted

## 2019-02-20 DIAGNOSIS — J9601 Acute respiratory failure with hypoxia: Secondary | ICD-10-CM | POA: Diagnosis not present

## 2019-02-20 DIAGNOSIS — Z7982 Long term (current) use of aspirin: Secondary | ICD-10-CM | POA: Diagnosis not present

## 2019-02-20 DIAGNOSIS — I4891 Unspecified atrial fibrillation: Secondary | ICD-10-CM | POA: Diagnosis not present

## 2019-02-20 DIAGNOSIS — Z8701 Personal history of pneumonia (recurrent): Secondary | ICD-10-CM | POA: Diagnosis not present

## 2019-02-20 DIAGNOSIS — I5023 Acute on chronic systolic (congestive) heart failure: Secondary | ICD-10-CM | POA: Diagnosis not present

## 2019-02-20 DIAGNOSIS — Z7984 Long term (current) use of oral hypoglycemic drugs: Secondary | ICD-10-CM | POA: Diagnosis not present

## 2019-02-20 DIAGNOSIS — Z950 Presence of cardiac pacemaker: Secondary | ICD-10-CM | POA: Diagnosis not present

## 2019-02-20 DIAGNOSIS — I2510 Atherosclerotic heart disease of native coronary artery without angina pectoris: Secondary | ICD-10-CM | POA: Diagnosis not present

## 2019-02-20 DIAGNOSIS — E1165 Type 2 diabetes mellitus with hyperglycemia: Secondary | ICD-10-CM | POA: Diagnosis not present

## 2019-02-20 NOTE — Telephone Encounter (Signed)
Spoke with patient re: next appt 03/02/2019

## 2019-02-20 NOTE — Telephone Encounter (Signed)
Patient has already been scheduled

## 2019-02-21 ENCOUNTER — Telehealth: Payer: Self-pay | Admitting: Family Medicine

## 2019-02-21 ENCOUNTER — Inpatient Hospital Stay: Payer: Medicare HMO | Admitting: Family Medicine

## 2019-02-21 NOTE — Telephone Encounter (Unsigned)
Copied from [place] [id number]. Topic: Quick Communication - Home Health Verbal Orders >> Feb 21, 2019 11:03 AM Celene Kras A wrote: Caller/Agency: [facility]  Callback Number: [phone] secure  Requesting OT/PT/Skilled Nursing/Social Work/Speech Therapy: Manuela Schwartz left a vm stating patients blood pressure was 102/58. She asks that it be adressed in pts next appt if it continues to run that low. And is requesting OT for pt. Please call back with next steps.  Frequency: 2x for 2wks/ 1x for 1wk

## 2019-02-21 NOTE — Telephone Encounter (Signed)
Orders have been given.

## 2019-02-21 NOTE — Telephone Encounter (Signed)
Cbc and cmp would be great.  I'm sure he would love to review results by appointment time on Friday if possible; if not we will check in once we get results.

## 2019-02-21 NOTE — Telephone Encounter (Signed)
Spoke with Manuela Schwartz. Orders have been given. The nurse is able to draw blood. We need to call back and let Manuela Schwartz know what bloodwork to order.   Please advise.

## 2019-02-21 NOTE — Telephone Encounter (Signed)
OK for all OT/PT/skilled nursing visits.   bp likely lower due to lasix we had him try; if he is able to check we can review on Friday.   Please ask if they can draw blood for him at house? For needed bloodwork/hospital follow up bloodwork?

## 2019-02-22 ENCOUNTER — Telehealth: Payer: Self-pay | Admitting: Pulmonary Disease

## 2019-02-22 ENCOUNTER — Encounter: Payer: Self-pay | Admitting: Pulmonary Disease

## 2019-02-22 ENCOUNTER — Ambulatory Visit: Payer: Medicare HMO | Admitting: Pulmonary Disease

## 2019-02-22 ENCOUNTER — Telehealth: Payer: Self-pay | Admitting: Internal Medicine

## 2019-02-22 ENCOUNTER — Ambulatory Visit (INDEPENDENT_AMBULATORY_CARE_PROVIDER_SITE_OTHER): Payer: Medicare HMO

## 2019-02-22 ENCOUNTER — Other Ambulatory Visit: Payer: Self-pay

## 2019-02-22 VITALS — BP 86/50 | HR 78 | Ht 71.0 in | Wt 162.8 lb

## 2019-02-22 DIAGNOSIS — J189 Pneumonia, unspecified organism: Secondary | ICD-10-CM | POA: Diagnosis not present

## 2019-02-22 DIAGNOSIS — I5022 Chronic systolic (congestive) heart failure: Secondary | ICD-10-CM | POA: Diagnosis not present

## 2019-02-22 DIAGNOSIS — C439 Malignant melanoma of skin, unspecified: Secondary | ICD-10-CM | POA: Diagnosis not present

## 2019-02-22 DIAGNOSIS — Z0000 Encounter for general adult medical examination without abnormal findings: Secondary | ICD-10-CM

## 2019-02-22 DIAGNOSIS — R600 Localized edema: Secondary | ICD-10-CM | POA: Insufficient documentation

## 2019-02-22 DIAGNOSIS — R0602 Shortness of breath: Secondary | ICD-10-CM | POA: Diagnosis not present

## 2019-02-22 DIAGNOSIS — J181 Lobar pneumonia, unspecified organism: Secondary | ICD-10-CM | POA: Diagnosis not present

## 2019-02-22 DIAGNOSIS — C779 Secondary and unspecified malignant neoplasm of lymph node, unspecified: Secondary | ICD-10-CM

## 2019-02-22 DIAGNOSIS — R0609 Other forms of dyspnea: Secondary | ICD-10-CM | POA: Insufficient documentation

## 2019-02-22 HISTORY — DX: Pneumonia, unspecified organism: J18.9

## 2019-02-22 LAB — CBC WITH DIFFERENTIAL/PLATELET
Basophils Absolute: 0 10*3/uL (ref 0.0–0.1)
Basophils Relative: 0.2 % (ref 0.0–3.0)
Eosinophils Absolute: 0.1 10*3/uL (ref 0.0–0.7)
Eosinophils Relative: 0.8 % (ref 0.0–5.0)
HEMATOCRIT: 41.7 % (ref 39.0–52.0)
HEMOGLOBIN: 13.8 g/dL (ref 13.0–17.0)
LYMPHS PCT: 10.3 % — AB (ref 12.0–46.0)
Lymphs Abs: 0.7 10*3/uL (ref 0.7–4.0)
MCHC: 33 g/dL (ref 30.0–36.0)
MCV: 89.2 fl (ref 78.0–100.0)
Monocytes Absolute: 0.3 10*3/uL (ref 0.1–1.0)
Monocytes Relative: 4.6 % (ref 3.0–12.0)
Neutro Abs: 5.8 10*3/uL (ref 1.4–7.7)
Neutrophils Relative %: 84.1 % — ABNORMAL HIGH (ref 43.0–77.0)
Platelets: 91 10*3/uL — ABNORMAL LOW (ref 150.0–400.0)
RBC: 4.67 Mil/uL (ref 4.22–5.81)
RDW: 16 % — ABNORMAL HIGH (ref 11.5–15.5)
WBC: 6.9 10*3/uL (ref 4.0–10.5)

## 2019-02-22 LAB — COMPREHENSIVE METABOLIC PANEL
ALT: 65 U/L — ABNORMAL HIGH (ref 0–53)
AST: 22 U/L (ref 0–37)
Albumin: 3.2 g/dL — ABNORMAL LOW (ref 3.5–5.2)
Alkaline Phosphatase: 94 U/L (ref 39–117)
BUN: 42 mg/dL — ABNORMAL HIGH (ref 6–23)
CHLORIDE: 100 meq/L (ref 96–112)
CO2: 27 mEq/L (ref 19–32)
Calcium: 8.3 mg/dL — ABNORMAL LOW (ref 8.4–10.5)
Creatinine, Ser: 1.35 mg/dL (ref 0.40–1.50)
GFR: 52.95 mL/min — ABNORMAL LOW (ref >60.00)
Glucose, Bld: 321 mg/dL — ABNORMAL HIGH (ref 70–99)
Potassium: 4.3 mEq/L (ref 3.5–5.1)
Sodium: 135 mEq/L (ref 135–145)
Total Bilirubin: 0.8 mg/dL (ref 0.2–1.2)
Total Protein: 5.5 g/dL — ABNORMAL LOW (ref 6.0–8.3)

## 2019-02-22 LAB — PRO B NATRIURETIC PEPTIDE: NT-PRO BNP: 2169 pg/mL — AB (ref 0–376)

## 2019-02-22 NOTE — Progress Notes (Signed)
Please contact the patient let him know that his lab work is elevated for retention of fluid.  Also is still showing slightly reduced kidney functioning.  Proceed forward with taking Lasix 20 mg every day.  Avoid high sodium foods.  Elevate legs.  Start wearing compression stockings.  Weigh yourself every day  Keep follow-up with primary care.  I also would like the patient to follow-up with cardiology.  We have contacted cardiology regarding his office visit today and we will route these results to primary care as well as cardiology.  Langley Gauss, please route these results to primary care as well as Dr. Lovena Le his cardiologist.  Wyn Quaker, FNP

## 2019-02-22 NOTE — Assessment & Plan Note (Addendum)
Assessment: Improved shortness of breath since hospitalization Pitting edema 3-4+ lower extremities  Plan: Chest x-ray today Lab work today Could consider pulmonary function testing in the future to evaluate shortness of breath if it persist as patient does have 20-pack-year smoking history

## 2019-02-22 NOTE — Assessment & Plan Note (Signed)
Assessment: 3-4+ pitting edema Patient is not wearing compression stockings Patient does not weigh himself at home Patient reports he is taking the Lasix at primary care started him on every other day  Plan: Start taking Lasix 20 mg daily Lab work today Purchase compression stockings Start weighing yourself daily Complete follow-up with primary care tomorrow Contact cardiology to discuss low blood pressure as well as needed guidance on lower extremity edema

## 2019-02-22 NOTE — Telephone Encounter (Signed)
Contacted Dr. Tanna Furry office [phone] and spoke with Margarita Grizzle re: patient's BP.  Advised we saw mutual patient in office today and BP continues to be low at 80/56.  Patient had concerns about how much coreg to take and this is being managed by cardiology.  CXR and labs done in our office and all notes forwarded to Dr. Lovena Le by Wyn Quaker, NP.  Request cardiology to contact patient to clarify questions regarding BP and related medications.  Nothing further needed.

## 2019-02-22 NOTE — Assessment & Plan Note (Signed)
Assessment: Uncontrolled diabetes Needing to follow low-sodium diet  Plan: Consider referral to medical nutrition therapy Patient to discuss with primary care at 02/23/2019 tele-visit

## 2019-02-22 NOTE — Assessment & Plan Note (Signed)
Plan: Followed by Dr. Alen Blew.  Complete follow-up with oncology outpatient as planned and outlined on his discharge instructions

## 2019-02-22 NOTE — Progress Notes (Signed)
Chart and office note reviewed in detail  > agree with a/p as outlined    

## 2019-02-22 NOTE — Telephone Encounter (Signed)
[facility] called patient  Pro bnp- [phone]  Routing to BM

## 2019-02-22 NOTE — Progress Notes (Signed)
Improved aeration seen on chest x-ray today.  This is good news.    Keep close follow-up with our office.  If you spike fevers, fatigue worsens, or you lose appetite, you need to contact our office.  If you have increased shortness of breath wheezing or you have trouble breathing then you can contact our office as well.  Pleasure meeting you today  Wyn Quaker, FNP

## 2019-02-22 NOTE — Telephone Encounter (Signed)
Thank you. Will follow up with patient and route to cardiology/pcp.   Aaron Edelman

## 2019-02-22 NOTE — Assessment & Plan Note (Signed)
Assessment: 01/31/2019 showing worsening airspace disease at right lung base Hospitalization requiring 2 intubations from suspected septic shock from right lower lobe pneumonia Blood cultures negative Respiratory virus panel negative Patient reports that since hospital discharge he has been doing quite well Patient is on room air Lung sounds are clear to auscultation today Patient reports he is afebrile at home Patient reports he has a good appetite  Plan: Lab work today Chest x-ray today Complete follow-up with primary care tomorrow Follow-up with Dr. Melvyn Novas in 8 weeks Monitor fevers and vital signs at home Continue to eat whole nutritious foods Contact our office if you start to have worsened shortness of breath or you have concerns regarding your health

## 2019-02-22 NOTE — Progress Notes (Signed)
'@Patient'  ID: Lee Hall, male    DOB: May 18, 1951, 68 y.o.   MRN: [id number]  Chief Complaint  Patient presents with   Follow-up    Hosp Follow Up -pneumonia, septic shock    Referring provider: [name], MD  HPI:  68 year old former smoker consulted with our team on 02/01/2019 well inpatient with a right lower lobe pneumonia, ischemic cardiomyopathy, chronic systolic heart failure, and septic shock  PMH: CAD, ischemic cardiomyopathy with chronic systolic CHF/LVEF 78% (entresto), VT status post ICD/PPM on amiodarone, melanoma on immunotherapy ([facility]) presented with dyspnea, leg swelling and hypotension. Smoker/ Smoking History: Former Smoker  Quit 2005.  20-pack-year smoking history. Maintenance: None Pt of: Dr. Melvyn Novas assessment:   02/22/2019  - Visit   68 year old male former smoker presenting to our office today for hospital follow-up.  Patient was consulted with our practice on 02/01/2019 when he was inpatient for a right lower lobe pneumonia, brief cardiac arrest on 02/01/2019, and septic shock.  This required the patient to be intubated 2 times.  Patient was discharged on 02/14/2019.  Patient reports that since being discharged he has been staying with his sister and brother-in-law at their house.  He reports that his breathing has improved on 100%.  He is very emotional regarding his recent hospitalization as well as his new reality regarding his health.  Patient was very anxious regarding his current comorbidities.  He request today for [place] to briefly go over his hospitalization and briefly go over his treatment plan.  But he does not want to go in depth at this time as he reports that he will become very anxious and this is all he will think about.  Patient has been completing physical therapy.  He does this 2-3 times a week.  This is been going well with the exceptions of his blood pressure is occasionally low but he is asymptomatic.  His blood pressure is soft  today.  They followed up with primary care regarding this.  They have not contacted cardiology.  Patient has planned follow-up with primary care on 02/23/2019 via WebEx. Patient has planned follow-up with cardiology in June/2020 which is a scheduled 45-monthfollow-up for his chronic systolic heart failure with reduced ejection fraction.  He is currently on Entresto.  Tests:   2D echo 3/5 >> EF 20 to 25%, mild/mod MR, mild/mod TR, mod AR  09/04/2018-PET scan- right axillary node metastasis, L1 focus of osseous hypermetabolic symptoms without CT correlate consider dedicated pre-and postcontrast lumbar spine MRI, hypermetabolic healing 146NGleft rib fractures presumably posttraumatic, superficial hypermetabolic some of the left lateral midfoot is indeterminate but warrants physical exam  01/31/2019-chest x-ray-worsened airspace disease at the right base, increasing atelectasis versus airspace disease at the left base  02/01/2019-respiratory virus panel-negative 02/01/2019-blood cultures-no growth  02/01/2019-tracheal aspirate-abundant WBCs present both PMN and mono nuclear, rare gram-positive cocci, rare gram variable rod, consistent with normal respiratory flora  FENO:  No results found for: NITRICOXIDE  PFT: No flowsheet data found.  Imaging: Dg Chest 1 View  Result Date: 02/05/2019 CLINICAL DATA:  Respiratory difficulty. EXAM: CHEST  1 VIEW COMPARISON:  Earlier same day FINDINGS: Right internal jugular central line tip in the SVC above the right atrium. Pacemaker/AICD appears the same. Slight worsening of bilateral interstitial and alveolar edema right worse than left. Probable small effusions. Coexistent pneumonia could certainly be present. IMPRESSION: Slight radiographic worsening since earlier today. Interstitial and alveolar density most consistent with edema. Coexistent pneumonia not excluded. Electronically Signed  By: Nelson Chimes M.D.   On: 02/05/2019 19:18   Dg Chest 2 View  Result  Date: 01/31/2019 CLINICAL DATA:  Cough EXAM: CHEST - 2 VIEW COMPARISON:  01/19/2019 FINDINGS: Airspace disease throughout the right lung worsened. Increased linear markings at the left lung base. Small bilateral pleural effusions. Left subclavian AICD device. Borderline cardiomegaly. No pneumothorax. IMPRESSION: Worsening airspace disease at the right base. Increasing atelectasis versus airspace disease at the left base. Small bilateral pleural effusions. Electronically Signed   By: Marybelle Killings M.D.   On: 01/31/2019 15:53   Dg Chest Port 1 View  Result Date: 02/07/2019 CLINICAL DATA:  History of ETT. EXAM: PORTABLE CHEST 1 VIEW COMPARISON:  02/06/2019. FINDINGS: Unchanged tubes and lines. ET tube 5.5 cm above carina. Multi lead defibrillator appears stable. No change in mild RIGHT-sided airspace opacities. Retrocardiac density likely atelectasis persists. Small LEFT effusion. IMPRESSION: Stable aeration. Electronically Signed   By: Staci Righter M.D.   On: 02/07/2019 07:40   Portable Chest X-ray  Result Date: 02/06/2019 CLINICAL DATA:  Intubated patient.  Airspace disease. EXAM: PORTABLE CHEST 1 VIEW COMPARISON:  Radiographs 02/05/2019 and 02/06/2019. FINDINGS: 1607 hours. Tip of the endotracheal tube is unchanged in the mid trachea. Enteric tube projects below the diaphragm, tip not visualized. Left subclavian AICD leads appear unchanged. Multiple other tubes and lines overlie the chest. 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 The right-greater-than-left airspace opacities appear minimally improved. There are probable small bilateral pleural effusions. No pneumothorax. IMPRESSION: 1. Stable support system and lines. 2. Minimal improvement in right-greater-than-left airspace opacities. Persistent bilateral pleural effusions. Electronically Signed   By: Richardean Sale M.D.   On: 02/06/2019 16:24   Portable Chest X-ray  Result Date:  02/06/2019 CLINICAL DATA:  Intubation. EXAM: PORTABLE CHEST 1 VIEW COMPARISON:  Chest radiograph February 05, 2019 at 1902 hours. FINDINGS: Endotracheal tube tip projects 7 cm above the carina. Nasogastric tube past proximal stomach, distal tip out of field of view. RIGHT internal jugular central venous catheter distal tip projects in mid superior vena cava. No pneumothorax. Interstitial and alveolar airspace opacities similar to prior examination. Retrocardiac air bronchograms. Small pleural effusions. Cardiac silhouette is mildly enlarged. Mediastinal silhouette is not suspicious. LEFT AICD in situ. Soft tissue planes included osseous structures are unchanged, surgical clips RIGHT axilla. IMPRESSION: 1. Endotracheal tube tip projects 7 cm above the carina. Nasogastric tube past proximal stomach. RIGHT internal jugular central venous catheter distal tip projects in mid superior vena cava. 2. Similar interstitial and alveolar airspace opacities seen with pulmonary edema and/or pneumonia. Small pleural effusions. 3. Mild cardiomegaly. Electronically Signed   By: Elon Alas M.D.   On: 02/06/2019 00:26   Dg Chest Port 1 View  Result Date: 02/05/2019 CLINICAL DATA:  Respiratory failure EXAM: PORTABLE CHEST 1 VIEW COMPARISON:  02/04/2019 FINDINGS: Chronic cardiomegaly. Dual-chamber ICD/pacer leads from the left. Right IJ line with tip at the SVC. Interval extubation. Asymmetric hazy opacity in the right lung which could be infection or asymmetric edema. There is generalized interstitial coarsening and pleural fluid. No pneumothorax. IMPRESSION: Probable inferior flow pleural fluid. Otherwise stable chest with asymmetric interstitial and airspace disease. Electronically Signed   By: Monte Fantasia M.D.   On: 02/05/2019 07:25   Dg Chest Port 1 View  Result Date: 02/04/2019 CLINICAL DATA:  Respiratory failure EXAM: PORTABLE CHEST 1 VIEW COMPARISON:  02/03/2019 FINDINGS: Cardiac shadow is enlarged but stable.  Defibrillator is again  noted. Right jugular central line, endotracheal tube and gastric catheter are noted. Persistent right mid lung opacification is noted. Diffuse increased density is noted on the right consistent with posteriorly layering effusion. Left retrocardiac density is noted and stable. No new focal abnormality is seen. IMPRESSION: Overall stable appearance of the chest with bilateral opacities and posteriorly layering right effusion. Electronically Signed   By: Inez Catalina M.D.   On: 02/04/2019 07:21   Dg Chest Port 1 View  Result Date: 02/03/2019 CLINICAL DATA:  Respiratory failure EXAM: PORTABLE CHEST 1 VIEW COMPARISON:  02/02/2019 FINDINGS: Endotracheal tube terminates 6.7 cm above carina. Right internal jugular line tip not well visualized. Pacer/AICD device. Right axillary node dissection. Cardiomegaly accentuated by AP portable technique. Nasogastric tube extends beyond the inferior aspect of the film. Slight worsening in right greater than left airspace disease. Underlying prominence of the pulmonary interstitium may be slightly increased. Probable small bilateral pleural effusions. No pneumothorax. IMPRESSION: Slightly worsened aeration, likely due to a combination of congestive heart failure and atelectasis or infection. Similar small bilateral pleural effusions. Electronically Signed   By: Abigail Miyamoto M.D.   On: 02/03/2019 08:10   Dg Chest Port 1 View  Result Date: 02/02/2019 CLINICAL DATA:  Respiratory failure EXAM: PORTABLE CHEST 1 VIEW COMPARISON:  02/01/2019 FINDINGS: Left AICD, endotracheal tube, right central line and NG tube remain in place, unchanged. Cardiomegaly. Bilateral airspace disease again noted, right greater than left. Small bilateral effusions. No acute bony abnormality. IMPRESSION: Stable bilateral airspace disease, right greater than left and small effusions. Electronically Signed   By: Rolm Baptise M.D.   On: 02/02/2019 07:28   Dg Chest Port 1 View  Result  Date: 02/01/2019 CLINICAL DATA:  Follow-up right jugular central line placement EXAM: PORTABLE CHEST 1 VIEW COMPARISON:  02/01/2019 FINDINGS: Endotracheal tube and gastric catheter are seen in satisfactory position. Defibrillator is again noted. Calcific changes along the cardiac margin are seen. Right jugular central line is noted without evidence of pneumothorax. Persistent opacity in the right lung is noted and stable. Increasing consolidation in the left retrocardiac region is noted. IMPRESSION: Status post gastric catheter placement and right jugular central line without evidence of pneumothorax. Stable airspace opacity within the right lung. Increasing consolidation left retrocardiac region is noted. Electronically Signed   By: Inez Catalina M.D.   On: 02/01/2019 11:30   Dg Chest Portable 1 View  Result Date: 02/01/2019 CLINICAL DATA:  Endotracheal tube placement EXAM: PORTABLE CHEST 1 VIEW COMPARISON:  Earlier today FINDINGS: New endotracheal tube with tip at the clavicular heads. Dual-chamber pacer leads from the left in stable position. Unchanged diffuse interstitial opacity with asymmetric airspace disease on the right. Cardiomegaly. IMPRESSION: 1. New endotracheal tube with tip at the clavicular heads. 2. Unchanged interstitial and airspace disease compared to earlier today. Electronically Signed   By: Monte Fantasia M.D.   On: 02/01/2019 08:37   Dg Chest Port 1 View  Result Date: 02/01/2019 CLINICAL DATA:  Shortness of breath EXAM: PORTABLE CHEST 1 VIEW COMPARISON:  Yesterday FINDINGS: Asymmetric right-sided airspace opacity. No lung opacity on PET-CT 09/04/2018. Small pleural effusions greater on the right. Diffuse interstitial coarsening with [employer]. Chronic cardiomegaly with dual-chamber pacer leads. There is duplicated right ventricular ICD leads. IMPRESSION: 1. Pulmonary edema and small right pleural effusion. 2. Asymmetric edema versus pneumonia on the right that has progressed from  prior. Electronically Signed   By: Monte Fantasia M.D.   On: 02/01/2019 06:36  Specialty Problems      Pulmonary Problems   Acute hypoxemic respiratory failure ([place])   Community acquired pneumonia of right lung   Pneumonia of right lower lobe due to infectious organism ([place])    01/31/2019-chest x-ray-worsened airspace disease at the right base, increasing atelectasis versus airspace disease at the left base  02/01/2019-blood cultures negative 02/01/2019-respiratory virus panel-negative      Shortness of breath      No Known Allergies  Immunization History  Administered Date(s) Administered   Influenza Split 09/16/2014   Influenza, High Dose Seasonal PF 09/11/2018   Influenza-Unspecified 09/23/2013   Pneumococcal Conjugate-13 09/11/2018   Pneumococcal Polysaccharide-23 09/23/2013   Tdap 12/26/2014   Zoster 09/23/2013   Zoster Recombinat (Shingrix) 08/11/2018, 10/31/2018    Past Medical History:  Diagnosis Date   Anxiety    CAD (coronary artery disease)    Heart failure, systolic, acute on chronic ([place])    ICD (implantable cardiac defibrillator), single, in situ    ISCHEMIC CARDIOMYOPATHY 03/19/2009   Qualifier: Diagnosis of  By: Boyce Medici RN, BSN, Ana     LEFT VENTRICULAR MURAL THROMBUS 02/18/2009   Qualifier: Diagnosis of  By: Peri Maris     Melanoma [facility]) 2019   MI (myocardial infarction) ([place]) 08/2003   PAROXYSMAL VENTRICULAR TACHYCARDIA 03/19/2009   Qualifier: Diagnosis of  By: Boyce Medici, RN, BSN, Ana     Pneumonia 02/08/2019   Pre-diabetes    SYSTOLIC HEART FAILURE, ACUTE ON CHRONIC 01/02/2009   Qualifier: Diagnosis of  By: Stevie Kern NP-C, Michelle     Ventricular tachycardia ([place])     Tobacco History: Social History   Tobacco Use  Smoking Status Former Smoker   Packs/day: 1.00   Years: 20.00   Pack years: 20.00   Types: Cigars, Cigarettes   Last attempt to quit: 02/22/2004   Years since quitting: 15.0  Smokeless  Tobacco Never Used   Counseling given: Yes  Continue to not smoke  Outpatient Encounter Medications as of 02/22/2019  Medication Sig   acyclovir (ZOVIRAX) 800 MG tablet Take 400 mg by mouth every other day.    amiodarone (PACERONE) 200 MG tablet Take 1 tablet (200 mg total) by mouth daily.   aspirin 81 MG tablet Take 81 mg by mouth daily.     blood glucose meter kit and supplies Dispense based on patient and insurance preference. Use up to four times daily as directed. (FOR ICD-10 E10.9, E11.9).   carvedilol (COREG) 6.25 MG tablet Take 0.5 tablets (3.125 mg total) by mouth 2 (two) times daily with a meal. On 02/19/2019 increase to 1 tab (6.25 mg total) twice daily.   empagliflozin (JARDIANCE) 10 MG TABS tablet Take 10 mg by mouth daily.   furosemide (LASIX) 20 MG tablet Take 1 tablet (20 mg total) by mouth daily as needed for edema.   ipratropium (ATROVENT) 0.06 % nasal spray Place 2 sprays into both nostrils 3 (three) times daily.   metFORMIN (GLUCOPHAGE-XR) 500 MG 24 hr tablet Take 500 mg by mouth 2 (two) times daily.    mexiletine (MEXITIL) 150 MG capsule Take 1 capsule (150 mg total) by mouth 3 (three) times daily.   predniSONE (DELTASONE) 10 MG tablet Take 3 tabs daily for 1 week, then 2 tabs daily for 1 week, then 1 tab daily for 1 week, then stop.   sacubitril-valsartan (ENTRESTO) 24-26 MG Take 1 tablet by mouth 2 (two) times daily.   Spacer/Aero-Holding Chambers (E-Z SPACER) inhaler Use as instructed   albuterol (PROVENTIL HFA;VENTOLIN  HFA) 108 (90 Base) MCG/ACT inhaler Inhale 1-2 puffs into the lungs every 6 (six) hours as needed for wheezing or shortness of breath.   No facility-administered encounter medications on file as of 02/22/2019.      Review of Systems  Review of Systems  Constitutional: Negative for activity change, appetite change, chills, fatigue, fever and unexpected weight change.  HENT: Negative for congestion, postnasal drip, rhinorrhea, sneezing  and sore throat.   Eyes: Negative.   Respiratory: Positive for cough (Occasional productive cough with thick white to light yellow sputum). Negative for shortness of breath and wheezing.   Cardiovascular: Negative for chest pain and palpitations.  Gastrointestinal: Negative for diarrhea, nausea and vomiting.  Endocrine: Negative.   Musculoskeletal: Negative.   Skin: Negative.   Neurological: Negative for dizziness, light-headedness and headaches.  Psychiatric/Behavioral: Negative.  Negative for dysphoric mood. The patient is not nervous/anxious.   All other systems reviewed and are negative.    Physical Exam  BP (!) 86/50 (BP Location: Left Arm, Patient Position: Sitting, Cuff Size: Normal)    Pulse 78    Ht '5\' 11"'  (1.803 m)    Wt 162 lb 12.8 oz (73.8 kg)    SpO2 98%    BMI 22.71 kg/m   Wt Readings from Last 5 Encounters:  02/22/19 162 lb 12.8 oz (73.8 kg)  02/14/19 168 lb 4.8 oz (76.3 kg)  01/31/19 183 lb 8 oz (83.2 kg)  01/23/19 179 lb 9.6 oz (81.5 kg)  01/19/19 179 lb 6.4 oz (81.4 kg)   Discussed weights with patient today  Discussed blood pressure results with patient, he has been monitoring at home  Physical Exam  Constitutional: He is oriented to person, place, and time and well-developed, well-nourished, and in no distress. He appears healthy.  Non-toxic appearance. No distress.  Chronically ill frail male  HENT:  Head: Normocephalic and atraumatic.  Right Ear: Hearing, tympanic membrane, external ear and ear canal normal.  Left Ear: Hearing, tympanic membrane, external ear and ear canal normal.  Mouth/Throat: Uvula is midline and oropharynx is clear and moist. No oropharyngeal exudate.  Postnasal drip  Eyes: Pupils are equal, round, and reactive to light.  Neck: Normal range of motion. Neck supple.  Cardiovascular: Normal rate, regular rhythm and normal heart sounds.  Pulmonary/Chest: Effort normal and breath sounds normal. No accessory muscle usage. No respiratory  distress. He has no decreased breath sounds. He has no wheezes. He has no rhonchi. He has no rales.  Musculoskeletal: Normal range of motion.        General: Edema (3-4+ pitting edema lower extremities bilaterally) present.  Lymphadenopathy:    He has no cervical adenopathy.  Neurological: He is alert and oriented to person, place, and time. Gait normal.  Skin: Skin is warm and dry. He is not diaphoretic. No erythema.  Psychiatric: Memory, affect and judgment normal. His mood appears anxious. He does not exhibit a depressed mood. He does not have a flat affect.  Nursing note and vitals reviewed.     Lab Results:  CBC    Component Value Date/Time   WBC 9.7 02/13/2019 0310   RBC 5.13 02/13/2019 0310   HGB 15.1 02/13/2019 0310   HGB 13.9 01/05/2019 0936   HCT 45.3 02/13/2019 0310   PLT 68 (L) 02/13/2019 0310   PLT 151 01/05/2019 0936   MCV 88.3 02/13/2019 0310   MCH 29.4 02/13/2019 0310   MCHC 33.3 02/13/2019 0310   RDW 14.5 02/13/2019 0310   LYMPHSABS 0.6 (  L) 02/02/2019 1005   MONOABS 0.6 02/02/2019 1005   EOSABS 0.0 02/02/2019 1005   BASOSABS 0.0 02/02/2019 1005    BMET    Component Value Date/Time   NA 135 02/14/2019 0729   NA 141 08/14/2018 1405   K 4.6 02/14/2019 0729   CL 95 (L) 02/14/2019 0729   CO2 29 02/14/2019 0729   GLUCOSE 377 (H) 02/14/2019 0729   BUN 49 (H) 02/14/2019 0729   BUN 19 08/14/2018 1405   CREATININE 1.38 (H) 02/14/2019 0729   CREATININE 1.07 01/19/2019 1638   CALCIUM 8.9 02/14/2019 0729   GFRNONAA 53 (L) 02/14/2019 0729   GFRNONAA >60 01/05/2019 0936   GFRAA >60 02/14/2019 0729   GFRAA >60 01/05/2019 0936    BNP    Component Value Date/Time   BNP 782 (H) 01/19/2019 1638    ProBNP    Component Value Date/Time   PROBNP 1,698.0 (H) 01/31/2019 1628      Assessment & Plan:     Chronic systolic heart failure (HCC) Assessment: 3-4+ pitting edema on exam today Recent 2D echo on 02/01/2019 showing EF of 20 to 25% Currently  maintained on Entresto Primary care started patient on 20 mg of Lasix, he is taking this every other day  Plan: Take 20 mg of Lasix daily, follow-up with cardiology regarding their instructions at this Lab work today Continue [employer] cardiology for sooner appointment than June/2020 -this may need to be with an APP or via telephone  Pneumonia of right lower lobe due to infectious organism [facility]) Assessment: 01/31/2019 showing worsening airspace disease at right lung base Hospitalization requiring 2 intubations from suspected septic shock from right lower lobe pneumonia Blood cultures negative Respiratory virus panel negative Patient reports that since hospital discharge he has been doing quite well Patient is on room air Lung sounds are clear to auscultation today Patient reports he is afebrile at home Patient reports he has a good appetite  Plan: Lab work today Chest x-ray today Complete follow-up with primary care tomorrow Follow-up with Dr. Melvyn Novas in 8 weeks Monitor fevers and vital signs at home Continue to eat whole nutritious foods Contact our office if you start to have worsened shortness of breath or you have concerns regarding your health  Lower extremity edema Assessment: 3-4+ pitting edema Patient is not wearing compression stockings Patient does not weigh himself at home Patient reports he is taking the Lasix at primary care started him on every other day  Plan: Start taking Lasix 20 mg daily Lab work today Purchase compression stockings Start weighing yourself daily Complete follow-up with primary care tomorrow Contact cardiology to discuss low blood pressure as well as needed guidance on lower extremity edema  Shortness of breath Assessment: Improved shortness of breath since hospitalization Pitting edema 3-4+ lower extremities  Plan: Chest x-ray today Lab work today Could consider pulmonary function testing in the future to evaluate shortness of  breath if it persist as patient does have 20-pack-year smoking history  Malignant melanoma metastatic to lymph node ([place]) Plan: Followed by Dr. Alen Blew.  Complete follow-up with oncology outpatient as planned and outlined on his discharge instructions   Healthcare maintenance Assessment: Uncontrolled diabetes Needing to follow low-sodium diet  Plan: Consider referral to medical nutrition therapy Patient to discuss with primary care at 02/23/2019 [place], NP 02/22/2019   This appointment was 45 min long with over 50% of the time in direct face-to-face patient care, assessment, plan of care,  and follow-up.

## 2019-02-22 NOTE — Telephone Encounter (Signed)
New Message         [place] with [facility]  Is calling today to give Dr. Lovena Le an update on patient. Patient was seen today.

## 2019-02-22 NOTE — Assessment & Plan Note (Signed)
Assessment: 3-4+ pitting edema on exam today Recent 2D echo on 02/01/2019 showing EF of 20 to 25% Currently maintained on Entresto Primary care started patient on 20 mg of Lasix, he is taking this every other day  Plan: Take 20 mg of Lasix daily, follow-up with cardiology regarding their instructions at this Lab work today Continue [employer] cardiology for sooner appointment than June/2020 -this may need to be with an APP or via telephone

## 2019-02-22 NOTE — Telephone Encounter (Signed)
Spoke with Langley Gauss at [employer].  Pt seen Wyn Quaker, NP there today for hosp f/u.  He requested they reach out to our office to update on how the pt was doing because they feel he needs to be seen or at least have a E-visit or telehealth.  Langley Gauss states that pt's BP today was 80/56.  They did order a CXR and labs today.  Wyn Quaker CC'ed his note to Dr. Lovena Le.  Advised I will send message to Dr. Lovena Le and his nurse to make them aware so he can review.

## 2019-02-22 NOTE — Patient Instructions (Signed)
Chest x-ray today  Lab work today  Follow-up with Dr. Melvyn Novas in 8 weeks  We will contact your cardiologist to discuss our findings today  Please contact your cardiologist to request questions regarding Coreg as well as your lower blood pressure  Complete Web[place] appointment with primary care tomorrow       Patient Education for Congestive Heart Failure  Do the following things EVERY DAY:   1. Weigh yourself EVERY morning after you go to the bathroom but before you eat or drink anything. Write this number down in a weight log/diary.    2. Take your medicines as prescribed. If you have concerns about your medications, please call us before you stop taking them.    3. Eat low salt foods-Limit salt (sodium) to 2000 mg per day. This will help prevent your body from holding onto fluid. Read food labels as many processed foods have a lot of sodium, especially canned goods and prepackaged meats. If you would like some assistance choosing low sodium foods, we would be happy to set you up with a nutritionist.   4. Stay as active as you can everyday. Staying active will give you more energy and make your muscles stronger. Start with 5 minutes at a time and work your way up to 30 minutes a day. Break up your activities--do some in the morning and some in the afternoon. Start with 3 days per week and work your way up to 5 days as you can.  If you have chest pain, feel short of breath, dizzy, or lightheaded, STOP. If you don't feel better after a short rest, call 911. If you do feel better, call the office to let us know you have symptoms with exercise.   5. Limit all fluids for the day to less than 2 liters. Fluid includes all drinks, coffee, juice, ice chips, soup, jello, and all other liquids.     Please purchase compression stockings or start wearing tight tube socks to help with lower extremity edema        Coronavirus (COVID-19) Are you at risk?  Are you at risk for the Coronavirus  (COVID-19)?  To be considered HIGH RISK for Coronavirus (COVID-19), you have to meet the following criteria:  . Traveled to [place], [place], [place], [place] or [place]; or in the [place] to [place], [place], [place], or [place]; and have fever, cough, and shortness of breath within the last 2 weeks of travel OR . Been in close contact with a person diagnosed with COVID-19 within the last 2 weeks and have fever, cough, and shortness of breath . IF YOU DO NOT MEET THESE CRITERIA, YOU ARE CONSIDERED LOW RISK FOR COVID-19.  What to do if you are HIGH RISK for COVID-19?  Marland Kitchen If you are having a medical emergency, call 911. . Seek medical care right away. Before you go to a doctor's office, urgent care or emergency department, call ahead and tell them about your recent travel, contact with someone diagnosed with COVID-19, and your symptoms. You should receive instructions from your physician's office regarding next steps of care.  . When you arrive at healthcare provider, tell the healthcare staff immediately you have returned from visiting [place], [place], [place], [place] or [place]; or traveled in the [place] to [place], [place], [place], or [place]; in the last two weeks or you have been in close contact with a person diagnosed with COVID-19 in the last 2 weeks.   Marland Kitchen  Tell the health care staff about your symptoms: fever, cough and shortness of breath. . After you have been seen by a medical provider, you will be either: o Tested for (COVID-19) and discharged home on quarantine except to seek medical care if symptoms worsen, and asked to  - Stay home and avoid contact with others until you get your results (4-5 days)  - Avoid travel on public transportation if possible (such as bus, train, or airplane) or o Sent to the Emergency Department by EMS for evaluation, COVID-19 testing, and possible admission depending on your condition and test results.  What to do if you are LOW  RISK for COVID-19?  Reduce your risk of any infection by using the same precautions used for avoiding the common cold or flu:  Marland Kitchen Wash your hands often with soap and warm water for at least 20 seconds.  If soap and water are not readily available, use an alcohol-based hand sanitizer with at least 60% alcohol.  . If coughing or sneezing, cover your mouth and nose by coughing or sneezing into the elbow areas of your shirt or coat, into a tissue or into your sleeve (not your hands). . Avoid shaking hands with others and consider head nods or verbal greetings only. . Avoid touching your eyes, nose, or mouth with unwashed hands.  . Avoid close contact with people who are sick. . Avoid places or events with large numbers of people in one location, like concerts or sporting events. . Carefully consider travel plans you have or are making. . If you are planning any travel outside or inside the [place], visit the CDC's Travelers' Health webpage for the latest health notices. . If you have some symptoms but not all symptoms, continue to monitor at home and seek medical attention if your symptoms worsen. . If you are having a medical emergency, call 911.   [place] / e-Visit: [email or web address]         [facility]: [facility]: [phone]                   [facility]: [phone]           It is flu season:   >>> Best ways to protect herself from the flu: Receive the yearly flu vaccine, practice good hand hygiene washing with soap and also using hand sanitizer when available, eat a nutritious meals, get adequate rest, hydrate appropriately   Please contact the office if your symptoms worsen or you have concerns that you are not improving.   Thank you for choosing [facility] for your healthcare, and for allowing [place] to partner  with you on your healthcare journey. I am thankful to be able to provide care to you today.   Wyn Quaker FNP-C     Edema  Edema is when you have too much fluid in your body or under your skin. Edema may make your legs, feet, and ankles swell up. Swelling is also common in looser tissues, like around your eyes. This is a common condition. It gets more common as you get older. There are many possible causes of edema. Eating too much salt (sodium) and being on your feet or sitting for a long time can cause edema in your legs, feet, and ankles. Hot weather may make edema worse. Edema is usually painless. Your skin may look swollen or shiny. Follow these instructions at  home:  Keep the swollen body part raised (elevated) above the level of your heart when you are sitting or lying down.  Do not sit still or stand for a long time.  Do not wear tight clothes. Do not wear garters on your upper legs.  Exercise your legs. This can help the swelling go down.  Wear elastic bandages or support stockings as told by your doctor.  Eat a low-salt (low-sodium) diet to reduce fluid as told by your doctor.  Depending on the cause of your swelling, you may need to limit how much fluid you drink (fluid restriction).  Take over-the-counter and prescription medicines only as told by your doctor. Contact a doctor if:  Treatment is not working.  You have heart, liver, or kidney disease and have symptoms of edema.  You have sudden and unexplained weight gain. Get help right away if:  You have shortness of breath or chest pain.  You cannot breathe when you lie down.  You have pain, redness, or warmth in the swollen areas.  You have heart, liver, or kidney disease and get edema all of a sudden.  You have a fever and your symptoms get worse all of a sudden. Summary  Edema is when you have too much fluid in your body or under your skin.  Edema may make your legs, feet, and ankles swell up. Swelling  is also common in looser tissues, like around your eyes.  Raise (elevate) the swollen body part above the level of your heart when you are sitting or lying down.  Follow your doctor's instructions about diet and how much fluid you can drink (fluid restriction). This information is not intended to replace advice given to you by your health care provider. Make sure you discuss any questions you have with your health care provider. Document Released: 05/03/2008 Document Revised: 12/03/2016 Document Reviewed: 12/03/2016 Elsevier Interactive Patient Education  2019 [employer].     Heart Failure  Heart failure means your heart has trouble pumping blood. This makes it hard for your body to work well. Heart failure is usually a long-term (chronic) condition. You must take good care of yourself and follow your treatment plan from your doctor. Follow these instructions at home: Medicines  Take over-the-counter and prescription medicines only as told by your doctor. ? Do not stop taking your medicine unless your doctor told you to do that. ? Do not skip any doses. ? Refill your prescriptions before you run out of medicine. You need your medicines every day. Eating and drinking   Eat heart-healthy foods. Talk with a diet and nutrition specialist (dietitian) to make an eating plan.  Choose foods that: ? Have no trans fat. ? Are low in saturated fat and cholesterol.  Choose healthy foods, like: ? Fresh or frozen fruits and vegetables. ? Fish. ? Low-fat (lean) meats. ? Legumes (like beans, peas, and lentils). ? Fat-free or low-fat dairy products. ? Whole-grain foods. ? High-fiber foods.  Limit salt (sodium) if told by your doctor. Ask your nutrition specialist to recommend heart-healthy seasonings.  Cook in healthy ways instead of frying. Healthy ways of cooking include: ? Roasting. ? Grilling. ? Broiling. ? Baking. ? Poaching. ? Steaming. ? Stir-frying.  Limit how much fluid  you drink, if told by your doctor. Lifestyle  Do not smoke or use chewing tobacco. Do not use nicotine gum or patches before talking to your doctor.  Limit alcohol intake to no more than 1 drink a day for  non-pregnant women and 2 drinks a day for men. One drink equals 12 oz of beer, 5 oz of wine, or 1 oz of hard liquor. ? Tell your doctor if you drink alcohol many times a week. ? Talk with your doctor about whether any alcohol is safe for you. ? You should stop drinking alcohol: ? If your heart has been damaged by alcohol. ? You have very bad heart failure.  Do not use illegal drugs.  Lose weight if told by your doctor.  Do moderate physical activity if told by your doctor. Ask your doctor what activities are safe for you if: ? You are of older age (elderly). ? You have very bad heart failure. Keep track of important information  Weigh yourself every day. ? Weigh yourself every morning after you pee (urinate) and before breakfast. ? Wear the same amount of clothing each time. ? Write down your daily weight. Give your record to your doctor.  Check and write down your blood pressure as told by your doctor.  Check your pulse as told by your doctor. Dealing with heat and cold  If the weather is very hot: ? Avoid activity that takes a lot of energy. ? Use air conditioning or fans, or find a cooler place. ? Avoid caffeine. ? Avoid alcohol. ? Wear clothing that is loose-fitting, lightweight, and light-colored.  If the weather is very cold: ? Avoid activity that takes a lot of energy. ? Layer your clothes. ? Wear mittens or gloves, a hat, and a scarf when you go outside. ? Avoid alcohol. General instructions  Manage other conditions that you have as told by your doctor.  Learn to manage stress. If you need help, ask your doctor.  Plan rest periods for when you get tired.  Get education and support as needed.  Get rehab (rehabilitation) to help you stay independent and to  help with everyday tasks.  Stay up to date with shots (immunizations), especially pneumococcal and flu (influenza) shots.  Keep all follow-up visits as told by your doctor. This is important. Contact a doctor if:  You gain weight quickly.  You are more short of breath than normal.  You cannot do your normal activities.  You tire easily.  You cough more than normal, especially with activity.  You have any or more puffiness (swelling) in areas such as your hands, feet, ankles, or belly (abdomen).  You cannot sleep because it is hard to breathe.  You feel like your heart is beating fast (palpitations).  You get dizzy or light-headed when you stand up. Get help right away if:  You have trouble breathing.  You or someone else notices a change in your awareness. This could be trouble staying awake or trouble concentrating.  You have chest pain or discomfort.  You pass out (faint). Summary  Heart failure means your heart has trouble pumping blood.  Make sure you refill your prescriptions before you run out of medicine. You need your medicines every day.  Keep records of your weight and blood pressure to give to your doctor.  Contact a doctor if you gain weight quickly. This information is not intended to replace advice given to you by your health care provider. Make sure you discuss any questions you have with your health care provider. Document Released: 08/24/2008 Document Revised: 08/09/2018 Document Reviewed: 12/07/2016 Elsevier Interactive Patient Education  2019 [employer].

## 2019-02-23 ENCOUNTER — Telehealth: Payer: Self-pay | Admitting: Internal Medicine

## 2019-02-23 ENCOUNTER — Ambulatory Visit (INDEPENDENT_AMBULATORY_CARE_PROVIDER_SITE_OTHER): Payer: Medicare HMO | Admitting: Family Medicine

## 2019-02-23 ENCOUNTER — Encounter: Payer: Self-pay | Admitting: Family Medicine

## 2019-02-23 ENCOUNTER — Ambulatory Visit: Payer: Medicare HMO | Admitting: Family Medicine

## 2019-02-23 DIAGNOSIS — I4891 Unspecified atrial fibrillation: Secondary | ICD-10-CM | POA: Diagnosis not present

## 2019-02-23 DIAGNOSIS — R600 Localized edema: Secondary | ICD-10-CM

## 2019-02-23 DIAGNOSIS — N179 Acute kidney failure, unspecified: Secondary | ICD-10-CM

## 2019-02-23 DIAGNOSIS — J9601 Acute respiratory failure with hypoxia: Secondary | ICD-10-CM | POA: Diagnosis not present

## 2019-02-23 DIAGNOSIS — C439 Malignant melanoma of skin, unspecified: Secondary | ICD-10-CM | POA: Diagnosis not present

## 2019-02-23 DIAGNOSIS — I5022 Chronic systolic (congestive) heart failure: Secondary | ICD-10-CM | POA: Diagnosis not present

## 2019-02-23 DIAGNOSIS — E1165 Type 2 diabetes mellitus with hyperglycemia: Secondary | ICD-10-CM | POA: Diagnosis not present

## 2019-02-23 DIAGNOSIS — R531 Weakness: Secondary | ICD-10-CM | POA: Diagnosis not present

## 2019-02-23 DIAGNOSIS — I959 Hypotension, unspecified: Secondary | ICD-10-CM | POA: Diagnosis not present

## 2019-02-23 DIAGNOSIS — Z8701 Personal history of pneumonia (recurrent): Secondary | ICD-10-CM | POA: Diagnosis not present

## 2019-02-23 DIAGNOSIS — Z950 Presence of cardiac pacemaker: Secondary | ICD-10-CM | POA: Diagnosis not present

## 2019-02-23 DIAGNOSIS — C779 Secondary and unspecified malignant neoplasm of lymph node, unspecified: Secondary | ICD-10-CM

## 2019-02-23 DIAGNOSIS — E119 Type 2 diabetes mellitus without complications: Secondary | ICD-10-CM | POA: Insufficient documentation

## 2019-02-23 DIAGNOSIS — I2510 Atherosclerotic heart disease of native coronary artery without angina pectoris: Secondary | ICD-10-CM | POA: Diagnosis not present

## 2019-02-23 DIAGNOSIS — Z7984 Long term (current) use of oral hypoglycemic drugs: Secondary | ICD-10-CM | POA: Diagnosis not present

## 2019-02-23 DIAGNOSIS — Z7982 Long term (current) use of aspirin: Secondary | ICD-10-CM | POA: Diagnosis not present

## 2019-02-23 DIAGNOSIS — I5023 Acute on chronic systolic (congestive) heart failure: Secondary | ICD-10-CM | POA: Diagnosis not present

## 2019-02-23 NOTE — Telephone Encounter (Signed)
Patient called today to state his BP is still low, it was 88/55 at 7:30am. He wants an appt prior to 6/1. He would like a nurse to call him. He states the PCP told him someone would call him.

## 2019-02-23 NOTE — Telephone Encounter (Signed)
Sent My-Chart message

## 2019-02-23 NOTE — Progress Notes (Signed)
Virtual Visit via Video Note  I connected with Lee Hall on 02/23/19 at  2:00 PM EDT by a video enabled telemedicine application and verified that I am speaking with the correct person using two identifiers.  Location patient: home Location provider:work or home office Persons participating in the virtual visit: patient, provider  I discussed the limitations of evaluation and management by telemedicine and the availability of in person appointments. The patient expressed understanding and agreed to proceed.   HPI: Lee Hall was admitted from: 02/01/19-02/14/19. He went into ER with increasing shortness of breath and cough, ended up going into cardiac and resp arrest and dx with sepsis secondary to pneumonia. Blood cultures were negative after 5 days. Resp panel was negative although covid -19 was not tested for.    Lee Hall states that he is doing pretty well overall. Breathing feels like "nothing ever happened."  He does not remember most of what happened when he was in the hospital and does not wish to know.  He feels that this will only increase his anxiety.  He wants his focus to remain currently on getting back to his normal state of health and strength.  He is working with physical therapy right now at his house.  He is having to "learn to walk again".  He states that his physical therapist told him he is doing excellent.  He really is pushing himself to get stronger every day.  She is rating him as a 6 out of 10 right now in terms of where he is at with his recovery.  His biggest concern right now is his blood pressure in the low blood pressures that he has been getting.  At his pulmonary visit yesterday blood pressures were less than 100s over 80s.  He does tend to run fairly low in the low 100s over 60s to 70s typically, but it is been a little lower.  He denies feeling lightheaded or dizzy with these low blood pressures.  He was supposed to increase his carvedilol back  to the full 6.25 mg tablet twice daily today, but he wanted to wait until her visit to discuss this.  He has not noticed a decrease in blood pressure with the Lasix that he started a couple of days ago.  We had started Lasix after a phone call earlier in the week that he was having increased lower extremity swelling.  He is keeping his legs elevated above heart level whenever possible including when sitting during the day or at night.  He has ordered compression stockings from online and is awaiting their delivery.  He states they will be at his house tomorrow.  He is monitoring sodium intake.  He feels that he is urinating very well since starting the Lasix and notes a very slight increase in his lower extremity swelling.  He states that it is not significant.  He does notice that swelling is somewhat better in the morning since starting the Lasix.  Another concern is that his voice is somewhat raspy and he feels he has phlegm in the back of his throat.  He has only about 1 cough daily and is able to get up this phlegm.  He is sleeping well.  He is not limited to position for sleep and is able to breathe comfortably on his back and sides.  This is a large change from prior to hospitalization.  He states he has a skin tear on his right hand about the size of  a silver dollar.  He also has a skin tear on his left hand that is scabbed up and healing.  He has started to dress the skin tear on the right hand, putting Neosporin and then a dressing over this wound to keep it moist while it heals.  He is not having any pain with this wound.  The nurse has been checking it when coming out to the house.  His legs had felt somewhat sore when coming home from the hospital, he feels that this has improved.  The tenderness within the legs his seem to improve with the Lasix.  He had significant tenderness on the bottom of his feet as well with starting to re-walk, but this seems to be getting better.  His appetite is  getting better since being home and he is getting good variety in his diet.  He is taking in Ensure protein max as well as a Glucerna once a day.  He eats a well-rounded dinner that usually includes chicken and vegetables.  He is eating cereal or waffle for breakfast along with fruit.  He is drinking about 50% water and 50% low-sodium Gatorade.   His sugars have been in the range of 98-122 over the last few days.  He has not had any low sugars or symptoms of low sugars.  He is on a prednisone taper.  He is currently living with his sister. She is there to help him when needed.    ROS: See pertinent positives and negatives per HPI.  Past Medical History:  Diagnosis Date  . Anxiety   . CAD (coronary artery disease)   . Diabetes mellitus type II, controlled ([place])   . Heart failure, systolic, acute on chronic ([place])   . ICD (implantable cardiac defibrillator), single, in situ   . ISCHEMIC CARDIOMYOPATHY 03/19/2009   Qualifier: Diagnosis of  By: Boyce Medici RN, BSN, [place]    . LEFT VENTRICULAR MURAL THROMBUS 02/18/2009   Qualifier: Diagnosis of  By: Stevie Kern NP-C, Sharyn Lull    . Melanoma ([place]) 2019  . MI (myocardial infarction) ([place]) 08/2003  . PAROXYSMAL VENTRICULAR TACHYCARDIA 03/19/2009   Qualifier: Diagnosis of  By: Boyce Medici RN, BSN, [place]    . Pneumonia 02/08/2019  . SYSTOLIC HEART FAILURE, ACUTE ON CHRONIC 01/02/2009   Qualifier: Diagnosis of  By: Stevie Kern NP-C, Sharyn Lull    . Ventricular tachycardia [facility])     Past Surgical History:  Procedure Laterality Date  . ABLATION  03/2018   IN [place]  . AXILLARY LYMPH NODE DISSECTION Right 10/05/2018   RIGHT AXILLARY LYMPH NODE DISSECTION   . AXILLARY LYMPH NODE DISSECTION Right 10/05/2018   Procedure: RIGHT AXILLARY LYMPH NODE DISSECTION;  Surgeon: Stark Klein, MD;  Location: [place];  Service: General;  Laterality: Right;  . CARDIAC CATHETERIZATION     01/30/18 [facility]): Occluded LAD. Patent LCX and RCA. Normal LVEDP 14 mmHg. Medical  managment.  Randolm Idol / REPLACE / REMOVE PACEMAKER    . SKIN BIOPSY  2019   melanoma R shoulder  . TONSILLECTOMY AND ADENOIDECTOMY  1969    Family History  Problem Relation Age of Onset  . Alzheimer's disease Mother   . Kidney Stones Father   . Diabetes Father   . Alcohol abuse Father   . Alzheimer's disease Maternal Grandmother     SOCIAL HX: living with sister. 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   Current Outpatient Medications:  .  acyclovir (ZOVIRAX) 800 MG tablet, Take 400 mg by mouth every other day. , Disp: , Rfl:  .  albuterol (PROVENTIL HFA;VENTOLIN HFA) 108 (90 Base) MCG/ACT inhaler, Inhale 1-2 puffs into the lungs every 6 (six) hours as needed for wheezing or shortness of breath., Disp: , Rfl:  .  amiodarone (PACERONE) 200 MG tablet, Take 1 tablet (200 mg total) by mouth daily., Disp: 90 tablet, Rfl: 3 .  aspirin 81 MG tablet, Take 81 mg by mouth daily.  , Disp: , Rfl:  .  blood glucose meter kit and supplies, Dispense based on patient and insurance preference. Use up to four times daily as directed. (FOR ICD-10 E10.9, E11.9)., Disp: 1 each, Rfl: 0 .  carvedilol (COREG) 6.25 MG tablet, Take 0.5 tablets (3.125 mg total) by mouth 2 (two) times daily with a meal. On 02/19/2019 increase to 1 tab (6.25 mg total) twice daily., Disp: 60 tablet, Rfl: 0 .  empagliflozin (JARDIANCE) 10 MG TABS tablet, Take 10 mg by mouth daily., Disp: 30 tablet, Rfl: 2 .  furosemide (LASIX) 20 MG tablet, Take 1 tablet (20 mg total) by mouth daily as needed for edema., Disp: 30 tablet, Rfl: 0 .  ipratropium (ATROVENT) 0.06 % nasal spray, Place 2 sprays into both nostrils 3 (three) times daily., Disp: 90 mL, Rfl: 3 .  metFORMIN (GLUCOPHAGE-XR) 500 MG 24 hr tablet, Take 500 mg  by mouth 2 (two) times daily. , Disp: , Rfl:  .  mexiletine (MEXITIL) 150 MG capsule, Take 1 capsule (150 mg total) by mouth 3 (three) times daily., Disp: 270 capsule, Rfl: 3 .  predniSONE (DELTASONE) 10 MG tablet, Take 3 tabs daily for 1 week, then 2 tabs daily for 1 week, then 1 tab daily for 1 week, then stop., Disp: 42 tablet, Rfl: 0 .  sacubitril-valsartan (ENTRESTO) 24-26 MG, Take 1 tablet by mouth 2 (two) times daily., Disp: 60 tablet, Rfl: 11 .  Spacer/Aero-Holding Chambers (E-Z SPACER) inhaler, Use as instructed, Disp: 1 each, Rfl: 2  EXAM:  VITALS per patient if applicable:wrist cuff prior to visit 117/62.  GENERAL: alert, oriented, appears well and in no acute distress  HEENT: atraumatic, conjunttiva clear, no obvious abnormalities on inspection of external nose and ears  NECK: normal movements of the head and neck  LUNGS: on inspection no signs of respiratory distress, breathing rate appears normal, no obvious gross SOB, gasping or wheezing  CV: no obvious cyanosis. He has notable pitting edema bilat LE; appears approx 3+ through video?   MS: moves all visible extremities without noticeable abnormality. He is slower to stand and takes time with position changes.   PSYCH/NEURO: pleasant and cooperative, no obvious depression or anxiety, speech and thought processing grossly intact  ASSESSMENT AND PLAN:  Discussed the following assessment and plan:  1. Chronic systolic heart failure [facility]) He has follow-up with cardiology scheduled.  We will need to continue to monitor his fluid status.  He has had some improvement with Lasix, although we have to use judiciously because of his delicate renal status.  See below.  2. Lower extremity edema Small improvement with Lasix daily.  Continue this.  We will need to monitor renal function.  Stressed the importance of hydration.  He will start wearing compression stockings tomorrow when they arrive and we will  check back in with him once  we get lab results.  Encouraged him to let us know if any worsening.  He was instructed to take daily weights from pulmonary/cardio.  3. Weakness acquired in ICU He is actively working with physical therapy at home.  He is doing exercises on his own.  We discussed the importance of healthy eating and getting sufficient protein in his daily diet.  He is doing a good job of supplementing protein with Ensure and Glucerna.  I have encouraged him to continue this.  4. Hypotension, unspecified hypotension type We discussed that hypotension is likely occurring as his body readjust status post hospitalization.  We discussed continuing to monitor blood pressure at home.  We may be able to further decrease his carvedilol, but I have asked him to discuss this with cardiology.  In the meantime, make sure to take time with position changes.  Let us know if symptomatic with low blood pressure.  5. Malignant melanoma metastatic to lymph node [facility]) He does have follow-up with oncology.  Treatments are on hold given current status.  6. Controlled type 2 diabetes mellitus with hyperglycemia, without long-term current use of insulin (HCC) Blood sugars been well controlled, in spite of him being on prednisone.  Continue to monitor sugars at home.  Once we get labs back next week we will set up a follow-up appointment for him so that we can review all of the above medical conditions and make sure that he is continuing to stay on track.   7. ARF Renal function had improved on most recent blood work done yesterday.  BUN is still significantly elevated.  He currently needs to take the Lasix due to slight fluid overload status, so we will continue to monitor renal function with blood work.  His home nurse is able to draw this, so we will arrange for home draw in 1 week's time to recheck status of kidneys.  I discussed the assessment and treatment plan with the patient. The patient was provided an opportunity to ask  questions and all were answered. The patient agreed with the plan and demonstrated an understanding of the instructions.   The patient was advised to call back or seek an in-person evaluation if the symptoms worsen or if the condition fails to improve as anticipated.  I provided 40 minutes of non-face-to-face time during this encounter.   Micheline Rough, MD

## 2019-02-23 NOTE — Telephone Encounter (Signed)
°*  STAT* If patient is at the pharmacy, call can be transferred to refill team.   1. Which medications need to be refilled? (please list name of each medication and dose if known) mexiletine (MEXITIL) 150 MG capsule  2. Which pharmacy/location (including street and city if local pharmacy) is medication to be sent to? [place], [place]  3. Do they need a 30 day or 90 day supply? 90 days

## 2019-02-24 DIAGNOSIS — Z950 Presence of cardiac pacemaker: Secondary | ICD-10-CM | POA: Diagnosis not present

## 2019-02-24 DIAGNOSIS — I4891 Unspecified atrial fibrillation: Secondary | ICD-10-CM | POA: Diagnosis not present

## 2019-02-24 DIAGNOSIS — E1165 Type 2 diabetes mellitus with hyperglycemia: Secondary | ICD-10-CM | POA: Diagnosis not present

## 2019-02-24 DIAGNOSIS — J9601 Acute respiratory failure with hypoxia: Secondary | ICD-10-CM | POA: Diagnosis not present

## 2019-02-24 DIAGNOSIS — I2510 Atherosclerotic heart disease of native coronary artery without angina pectoris: Secondary | ICD-10-CM | POA: Diagnosis not present

## 2019-02-24 DIAGNOSIS — Z8701 Personal history of pneumonia (recurrent): Secondary | ICD-10-CM | POA: Diagnosis not present

## 2019-02-24 DIAGNOSIS — Z7982 Long term (current) use of aspirin: Secondary | ICD-10-CM | POA: Diagnosis not present

## 2019-02-24 DIAGNOSIS — Z7984 Long term (current) use of oral hypoglycemic drugs: Secondary | ICD-10-CM | POA: Diagnosis not present

## 2019-02-24 DIAGNOSIS — I5023 Acute on chronic systolic (congestive) heart failure: Secondary | ICD-10-CM | POA: Diagnosis not present

## 2019-02-26 DIAGNOSIS — I2510 Atherosclerotic heart disease of native coronary artery without angina pectoris: Secondary | ICD-10-CM | POA: Diagnosis not present

## 2019-02-26 DIAGNOSIS — Z8701 Personal history of pneumonia (recurrent): Secondary | ICD-10-CM | POA: Diagnosis not present

## 2019-02-26 DIAGNOSIS — Z950 Presence of cardiac pacemaker: Secondary | ICD-10-CM | POA: Diagnosis not present

## 2019-02-26 DIAGNOSIS — Z7982 Long term (current) use of aspirin: Secondary | ICD-10-CM | POA: Diagnosis not present

## 2019-02-26 DIAGNOSIS — I5023 Acute on chronic systolic (congestive) heart failure: Secondary | ICD-10-CM | POA: Diagnosis not present

## 2019-02-26 DIAGNOSIS — E1165 Type 2 diabetes mellitus with hyperglycemia: Secondary | ICD-10-CM | POA: Diagnosis not present

## 2019-02-26 DIAGNOSIS — Z7984 Long term (current) use of oral hypoglycemic drugs: Secondary | ICD-10-CM | POA: Diagnosis not present

## 2019-02-26 DIAGNOSIS — J9601 Acute respiratory failure with hypoxia: Secondary | ICD-10-CM | POA: Diagnosis not present

## 2019-02-26 DIAGNOSIS — I4891 Unspecified atrial fibrillation: Secondary | ICD-10-CM | POA: Diagnosis not present

## 2019-02-27 ENCOUNTER — Telehealth: Payer: Self-pay | Admitting: Family Medicine

## 2019-02-27 ENCOUNTER — Encounter: Payer: Self-pay | Admitting: Family Medicine

## 2019-02-27 DIAGNOSIS — Z8701 Personal history of pneumonia (recurrent): Secondary | ICD-10-CM | POA: Diagnosis not present

## 2019-02-27 DIAGNOSIS — Z7982 Long term (current) use of aspirin: Secondary | ICD-10-CM | POA: Diagnosis not present

## 2019-02-27 DIAGNOSIS — I4891 Unspecified atrial fibrillation: Secondary | ICD-10-CM | POA: Diagnosis not present

## 2019-02-27 DIAGNOSIS — J9601 Acute respiratory failure with hypoxia: Secondary | ICD-10-CM | POA: Diagnosis not present

## 2019-02-27 DIAGNOSIS — E1165 Type 2 diabetes mellitus with hyperglycemia: Secondary | ICD-10-CM | POA: Diagnosis not present

## 2019-02-27 DIAGNOSIS — Z950 Presence of cardiac pacemaker: Secondary | ICD-10-CM | POA: Diagnosis not present

## 2019-02-27 DIAGNOSIS — I5023 Acute on chronic systolic (congestive) heart failure: Secondary | ICD-10-CM | POA: Diagnosis not present

## 2019-02-27 DIAGNOSIS — I2510 Atherosclerotic heart disease of native coronary artery without angina pectoris: Secondary | ICD-10-CM | POA: Diagnosis not present

## 2019-02-27 DIAGNOSIS — Z7984 Long term (current) use of oral hypoglycemic drugs: Secondary | ICD-10-CM | POA: Diagnosis not present

## 2019-02-27 NOTE — Telephone Encounter (Signed)
I have been in touch with the patient via mychart.

## 2019-02-27 NOTE — Telephone Encounter (Signed)
Patient called requesting to speak to [facility] or Dr. Inocente Salles about Furosemide 20mg  tab that he is currently taking.  Please call [phone]

## 2019-02-28 DIAGNOSIS — I2510 Atherosclerotic heart disease of native coronary artery without angina pectoris: Secondary | ICD-10-CM | POA: Diagnosis not present

## 2019-02-28 DIAGNOSIS — Z7982 Long term (current) use of aspirin: Secondary | ICD-10-CM | POA: Diagnosis not present

## 2019-02-28 DIAGNOSIS — Z7984 Long term (current) use of oral hypoglycemic drugs: Secondary | ICD-10-CM | POA: Diagnosis not present

## 2019-02-28 DIAGNOSIS — J9601 Acute respiratory failure with hypoxia: Secondary | ICD-10-CM | POA: Diagnosis not present

## 2019-02-28 DIAGNOSIS — I4891 Unspecified atrial fibrillation: Secondary | ICD-10-CM | POA: Diagnosis not present

## 2019-02-28 DIAGNOSIS — E1165 Type 2 diabetes mellitus with hyperglycemia: Secondary | ICD-10-CM | POA: Diagnosis not present

## 2019-02-28 DIAGNOSIS — Z950 Presence of cardiac pacemaker: Secondary | ICD-10-CM | POA: Diagnosis not present

## 2019-02-28 DIAGNOSIS — I5023 Acute on chronic systolic (congestive) heart failure: Secondary | ICD-10-CM | POA: Diagnosis not present

## 2019-02-28 DIAGNOSIS — Z8701 Personal history of pneumonia (recurrent): Secondary | ICD-10-CM | POA: Diagnosis not present

## 2019-03-01 ENCOUNTER — Other Ambulatory Visit: Payer: Self-pay

## 2019-03-01 ENCOUNTER — Telehealth (INDEPENDENT_AMBULATORY_CARE_PROVIDER_SITE_OTHER): Payer: Medicare HMO | Admitting: Internal Medicine

## 2019-03-01 ENCOUNTER — Encounter: Payer: Self-pay | Admitting: General Practice

## 2019-03-01 DIAGNOSIS — E1165 Type 2 diabetes mellitus with hyperglycemia: Secondary | ICD-10-CM | POA: Diagnosis not present

## 2019-03-01 DIAGNOSIS — Z7984 Long term (current) use of oral hypoglycemic drugs: Secondary | ICD-10-CM | POA: Diagnosis not present

## 2019-03-01 DIAGNOSIS — J9601 Acute respiratory failure with hypoxia: Secondary | ICD-10-CM | POA: Diagnosis not present

## 2019-03-01 DIAGNOSIS — Z950 Presence of cardiac pacemaker: Secondary | ICD-10-CM | POA: Diagnosis not present

## 2019-03-01 DIAGNOSIS — I255 Ischemic cardiomyopathy: Secondary | ICD-10-CM | POA: Diagnosis not present

## 2019-03-01 DIAGNOSIS — I2510 Atherosclerotic heart disease of native coronary artery without angina pectoris: Secondary | ICD-10-CM | POA: Diagnosis not present

## 2019-03-01 DIAGNOSIS — I5023 Acute on chronic systolic (congestive) heart failure: Secondary | ICD-10-CM | POA: Diagnosis not present

## 2019-03-01 DIAGNOSIS — Z8701 Personal history of pneumonia (recurrent): Secondary | ICD-10-CM | POA: Diagnosis not present

## 2019-03-01 DIAGNOSIS — Z7982 Long term (current) use of aspirin: Secondary | ICD-10-CM | POA: Diagnosis not present

## 2019-03-01 DIAGNOSIS — I4891 Unspecified atrial fibrillation: Secondary | ICD-10-CM | POA: Diagnosis not present

## 2019-03-01 DIAGNOSIS — I5022 Chronic systolic (congestive) heart failure: Secondary | ICD-10-CM

## 2019-03-01 NOTE — Progress Notes (Signed)
Electrophysiology TeleHealth Note   Due to national recommendations of social distancing due to COVID 19, an audio/video telehealth visit is felt to be most appropriate for this patient at this time.  See MyChart message from today for the patient's consent to telehealth for [facility].   Date:  03/01/2019   ID:  Lee Hall, DOB [id number], MRN [id number]  Location: patient's home  Provider location: [address] [place]  Evaluation Performed: Follow-up visit  PCP:  [name], MD  Cardiologist:  Cristopher Peru, MD  Electrophysiologist:  Dr Lovena Le  Chief Complaint:  " I have a question about my medicines"  History of Present Illness:    Lee Hall is a 68 y.o. male who presents via audio conferencing for a telehealth visit today.  Since last being seen in our clinic, the patient reports being in the hospital with pneumonia and sepsis. He did not grow anything out of his blood cultures. He was treated with anti-biotics and eventually improved. His coreg was reduced due to low blood pressure.  Today, he denies symptoms of palpitations, chest pain, shortness of breath,  lower extremity edema, dizziness, presyncope, or syncope.  The patient is otherwise without complaint today.  The patient denies symptoms of fevers, chills, cough, or new SOB worrisome for COVID 19.  Past Medical History:  Diagnosis Date  . Anxiety   . CAD (coronary artery disease)   . Diabetes mellitus type II, controlled ([place])   . Heart failure, systolic, acute on chronic ([place])   . ICD (implantable cardiac defibrillator), single, in situ   . ISCHEMIC CARDIOMYOPATHY 03/19/2009   Qualifier: Diagnosis of  By: Boyce Medici RN, BSN, [place]    . LEFT VENTRICULAR MURAL THROMBUS 02/18/2009   Qualifier: Diagnosis of  By: Stevie Kern NP-C, Sharyn Lull    . Melanoma ([place]) 2019  . MI (myocardial infarction) ([place]) 08/2003  . PAROXYSMAL VENTRICULAR TACHYCARDIA 03/19/2009   Qualifier:  Diagnosis of  By: Boyce Medici RN, BSN, [place]    . Pneumonia 02/08/2019  . SYSTOLIC HEART FAILURE, ACUTE ON CHRONIC 01/02/2009   Qualifier: Diagnosis of  By: Stevie Kern NP-C, Sharyn Lull    . Ventricular tachycardia [facility])     Past Surgical History:  Procedure Laterality Date  . ABLATION  03/2018   IN [place]  . AXILLARY LYMPH NODE DISSECTION Right 10/05/2018   RIGHT AXILLARY LYMPH NODE DISSECTION   . AXILLARY LYMPH NODE DISSECTION Right 10/05/2018   Procedure: RIGHT AXILLARY LYMPH NODE DISSECTION;  Surgeon: Stark Klein, MD;  Location: [place];  Service: General;  Laterality: Right;  . CARDIAC CATHETERIZATION     01/30/18 [facility]): Occluded LAD. Patent LCX and RCA. Normal LVEDP 14 mmHg. Medical managment.  Randolm Idol / REPLACE / REMOVE PACEMAKER    . SKIN BIOPSY  2019   melanoma R shoulder  . TONSILLECTOMY AND ADENOIDECTOMY  1969    Current Outpatient Medications  Medication Sig Dispense Refill  . acyclovir (ZOVIRAX) 800 MG tablet Take 400 mg by mouth every other day.     . albuterol (PROVENTIL HFA;VENTOLIN HFA) 108 (90 Base) MCG/ACT inhaler Inhale 1-2 puffs into the lungs every 6 (six) hours as needed for wheezing or shortness of breath.    Marland Kitchen amiodarone (PACERONE) 200 MG tablet Take 1 tablet (200 mg total) by mouth daily. 90 tablet 3  . aspirin 81 MG tablet Take 81 mg by mouth daily.      . blood glucose meter kit and supplies Dispense based on  patient and insurance preference. Use up to four times daily as directed. (FOR ICD-10 E10.9, E11.9). 1 each 0  . carvedilol (COREG) 6.25 MG tablet Take 0.5 tablets (3.125 mg total) by mouth 2 (two) times daily with a meal. On 02/19/2019 increase to 1 tab (6.25 mg total) twice daily. 60 tablet 0  . empagliflozin (JARDIANCE) 10 MG TABS tablet Take 10 mg by mouth daily. 30 tablet 2  . furosemide (LASIX) 20 MG tablet Take 1 tablet (20 mg total) by mouth daily as needed for edema. 30 tablet 0  . ipratropium (ATROVENT) 0.06 % nasal spray Place 2 sprays into  both nostrils 3 (three) times daily. 90 mL 3  . metFORMIN (GLUCOPHAGE-XR) 500 MG 24 hr tablet Take 500 mg by mouth 2 (two) times daily.     Marland Kitchen mexiletine (MEXITIL) 150 MG capsule Take 1 capsule (150 mg total) by mouth 3 (three) times daily. 270 capsule 3  . predniSONE (DELTASONE) 10 MG tablet Take 3 tabs daily for 1 week, then 2 tabs daily for 1 week, then 1 tab daily for 1 week, then stop. 42 tablet 0  . sacubitril-valsartan (ENTRESTO) 24-26 MG Take 1 tablet by mouth 2 (two) times daily. 60 tablet 11  . Spacer/Aero-Holding Chambers (E-Z SPACER) inhaler Use as instructed 1 each 2   No current facility-administered medications for this visit.     Allergies:   Patient has no known allergies.   Social History:  The patient  reports that he quit smoking about 15 years ago. His smoking use included cigars and cigarettes. He has a 20.00 pack-year smoking history. He has never used smokeless tobacco. He reports current alcohol use. He reports that he does not use drugs.   Family History:  The patient's  family history includes Alcohol abuse in his father; Alzheimer's disease in his maternal grandmother and mother; Diabetes in his father; Kidney Stones in his father.   ROS:  Please see the history of present illness.   All other systems are personally reviewed and negative.    Exam:    Vital Signs:  BP - 102/68, P - 62   Labs/Other Tests and Data Reviewed:    Recent Labs: 01/05/2019: TSH 3.092 01/19/2019: Brain Natriuretic Peptide 782 02/13/2019: Magnesium 2.2 02/22/2019: ALT 65; BUN 42; Creatinine, Ser 1.35; Hemoglobin 13.8; NT-Pro BNP 2,169; Platelets 91.0; Potassium 4.3; Sodium 135   Wt Readings from Last 3 Encounters:  02/22/19 162 lb 12.8 oz (73.8 kg)  02/14/19 168 lb 4.8 oz (76.3 kg)  01/31/19 183 lb 8 oz (83.2 kg)     Other studies personally reviewed:  Last device remote is reviewed from [employer] PDF dated 01/23/19 which reveals normal device function, no arrhythmias    ASSESSMENT  & PLAN:    1.  Chronic systolic heart failure - as his bp has been on the low side, I have asked him to reduce his dose of coreg to 3.125 bid. He will continue his other meds. 2. ICD - his [employer] device is working normally. 3. Dyslipidemia - while in the hospital, his crestor was stopped. I encouraged the patient to start it back.  4. COVID 19 screen The patient denies symptoms of COVID 19 at this time.  The importance of social distancing was discussed today.  Follow-up:  4 months Next remote: 03/2019  Current medicines are reviewed at length with the patient today.   The patient does not have concerns regarding his medicines.  The following changes were made today:  none  Labs/ tests ordered today include: none No orders of the defined types were placed in this encounter.    Patient Risk:  after full review of this patients clinical status, I feel that they are at moderate risk at this time.  Today, I have spent 15 minutes with the patient with telehealth technology discussing his problems as above .    Signed, Cristopher Peru, MD  03/01/2019 2:06 PM     [place] [address] [phone] (office) [phone] (fax)

## 2019-03-02 ENCOUNTER — Inpatient Hospital Stay: Payer: Medicare HMO

## 2019-03-02 ENCOUNTER — Inpatient Hospital Stay: Payer: Medicare HMO | Attending: Oncology | Admitting: Oncology

## 2019-03-02 ENCOUNTER — Telehealth: Payer: Self-pay | Admitting: Oncology

## 2019-03-02 ENCOUNTER — Other Ambulatory Visit: Payer: Self-pay

## 2019-03-02 VITALS — BP 100/60 | HR 70 | Temp 97.4°F | Resp 17 | Ht 71.0 in | Wt 157.8 lb

## 2019-03-02 DIAGNOSIS — Z7982 Long term (current) use of aspirin: Secondary | ICD-10-CM | POA: Insufficient documentation

## 2019-03-02 DIAGNOSIS — Z79899 Other long term (current) drug therapy: Secondary | ICD-10-CM | POA: Diagnosis not present

## 2019-03-02 DIAGNOSIS — C773 Secondary and unspecified malignant neoplasm of axilla and upper limb lymph nodes: Secondary | ICD-10-CM | POA: Diagnosis not present

## 2019-03-02 DIAGNOSIS — C439 Malignant melanoma of skin, unspecified: Secondary | ICD-10-CM

## 2019-03-02 DIAGNOSIS — C4361 Malignant melanoma of right upper limb, including shoulder: Secondary | ICD-10-CM | POA: Insufficient documentation

## 2019-03-02 DIAGNOSIS — C779 Secondary and unspecified malignant neoplasm of lymph node, unspecified: Secondary | ICD-10-CM

## 2019-03-02 NOTE — Telephone Encounter (Signed)
Should the patient continue his fluid medication?

## 2019-03-02 NOTE — Telephone Encounter (Signed)
Scheduled appt per 4/3 los.  Patient aware of appt date and time.

## 2019-03-02 NOTE — Telephone Encounter (Signed)
I sent him another message about this this morning. Just make sure he got it (please just call him). We seem to have multiple messages floating around at this point.

## 2019-03-02 NOTE — Telephone Encounter (Signed)
See if you can get in touch with her today. We wanted these follow up labs this week. Would be ok if we got them early next week.

## 2019-03-02 NOTE — Progress Notes (Signed)
Hematology and Oncology Follow Up Visit  Lee Hall [id number] February 02, 1951 68 y.o. 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  He was found to have T3 N3, 4 mm ulcerated melanoma, BRAF positive with 4 out of 28 lymph nodes involved in the right axilla.   Prior Therapy:  He is status post resection in December 09, 2017 completed in [place].  He had positive margins without any lymph node sampling.  He is status post right axillary lymph node dissection completed on 10/05/2018 by Dr. Barry Hall.  Final pathology revealed 4 out of 28 lymph node involvement.   Current therapy: Nivolumab 480 milligrams monthly started in January 2020 for adjuvant purposes.  He is status post 2 months of therapy and therapy discontinued because of acute illness and hospitalization.  Interim History: Lee Hall is here for a repeat evaluation.  Since the last visit, he completed 2 months of nivolumab and therapy has been on hold because of acute illness.  He presented with symptoms of shortness of breath and presumed pneumonia that required hospitalization after developed respiratory failure as well as cardiac arrest which she has recovered from reasonably well.  Since his discharge from his hospitalizations on February 14, 2019 he reports feeling reasonably well and continues to improve.  He does report some mild dyspnea on exertion but breathing is described as close to normal.  His lower extremity edema has resolved at this time.    Patient denied headaches, blurry vision, syncope or seizures.  Denies any fevers, chills or sweats.  Denied chest pain, palpitation, orthopnea or leg edema.  Denied cough, wheezing or hemoptysis.  Denied nausea, vomiting or abdominal pain.  Denies any constipation or diarrhea.  Denies any frequency urgency or hesitancy.  Denies any arthralgias or myalgias.  Denies any  skin rashes or lesions.  Denies any bleeding or clotting tendency.  Denies any easy bruising.  Denies any hair or nail changes.  Denies any anxiety or depression.  Remaining review of system is negative.         Medications: I have reviewed the patient's current medications.  Current Outpatient Medications  Medication Sig Dispense Refill  . acyclovir (ZOVIRAX) 800 MG tablet Take 400 mg by mouth every other day.     . albuterol (PROVENTIL HFA;VENTOLIN HFA) 108 (90 Base) MCG/ACT inhaler Inhale 1-2 puffs into the lungs every 6 (six) hours as needed for wheezing or shortness of breath.    Marland Kitchen amiodarone (PACERONE) 200 MG tablet Take 1 tablet (200 mg total) by mouth daily. 90 tablet 3  . aspirin 81 MG tablet Take 81 mg by mouth daily.      . blood glucose meter kit and supplies Dispense based on patient and insurance preference. Use up to four times daily as directed. (FOR ICD-10 E10.9, E11.9). 1 each 0  . carvedilol (COREG) 6.25 MG tablet Take 0.5 tablets (3.125 mg total) by mouth 2 (two) times daily with a meal. On 02/19/2019 increase to 1 tab (6.25 mg total) twice daily. 60 tablet 0  . empagliflozin (JARDIANCE) 10 MG TABS tablet Take 10 mg by mouth daily. 30 tablet 2  . furosemide (LASIX) 20 MG tablet Take 1 tablet (20 mg total) by mouth daily as needed for edema. 30 tablet 0  . ipratropium (ATROVENT) 0.06 % nasal spray Place 2 sprays into both nostrils 3 (three) times daily. 90 mL 3  . metFORMIN (GLUCOPHAGE-XR)  500 MG 24 hr tablet Take 500 mg by mouth 2 (two) times daily.     Marland Kitchen mexiletine (MEXITIL) 150 MG capsule Take 1 capsule (150 mg total) by mouth 3 (three) times daily. 270 capsule 3  . predniSONE (DELTASONE) 10 MG tablet Take 3 tabs daily for 1 week, then 2 tabs daily for 1 week, then 1 tab daily for 1 week, then stop. 42 tablet 0  . sacubitril-valsartan (ENTRESTO) 24-26 MG Take 1 tablet by mouth 2 (two) times daily. 60 tablet 11  . Spacer/Aero-Holding Chambers (E-Z SPACER) inhaler Use as  instructed 1 each 2   No current facility-administered medications for this visit.      Allergies: No Known Allergies  Past Medical History, Surgical history, Social history, and Family History were reviewed and updated.  Marland Kitchen  Physical Exam:  Blood pressure 100/60, pulse 70, temperature (!) 97.4 F (36.3 C), temperature source Oral, resp. rate 17, height '5\' 11"'  (1.803 m), weight 157 lb 12.8 oz (71.6 kg), SpO2 100 %.   ECOG: 1     General appearance: Alert, awake without any distress. Head: Atraumatic without abnormalities Oropharynx: Without any thrush or ulcers. Eyes: No scleral icterus. Lymph nodes: No lymphadenopathy noted in the cervical, supraclavicular, or axillary nodes Heart:regular rate and rhythm, without any murmurs or gallops.   Lung: Clear to auscultation without any rhonchi, wheezes or dullness to percussion. Abdomin: Soft, nontender without any shifting dullness or ascites. Musculoskeletal: No clubbing or cyanosis. Neurological: No motor or sensory deficits. Skin: Well-healed scar on his shoulder on the right side.       Lab Results: Lab Results  Component Value Date   WBC 6.9 02/22/2019   HGB 13.8 02/22/2019   HCT 41.7 02/22/2019   MCV 89.2 02/22/2019   PLT 91.0 (L) 02/22/2019     Chemistry      Component Value Date/Time   NA 135 02/22/2019 1006   NA 141 08/14/2018 1405   K 4.3 02/22/2019 1006   CL 100 02/22/2019 1006   CO2 27 02/22/2019 1006   BUN 42 (H) 02/22/2019 1006   BUN 19 08/14/2018 1405   CREATININE 1.35 02/22/2019 1006   CREATININE 1.07 01/19/2019 1638      Component Value Date/Time   CALCIUM 8.3 (L) 02/22/2019 1006   ALKPHOS 94 02/22/2019 1006   AST 22 02/22/2019 1006   AST 22 01/05/2019 0936   ALT 65 (H) 02/22/2019 1006   ALT 23 01/05/2019 0936   BILITOT 0.8 02/22/2019 1006   BILITOT 0.6 01/05/2019 0936         Impression and Plan:  68 year old man with:  1.  Melanoma of the right shoulder diagnosed in  January 2019.  He has T3b N3 that is BRAF positive.   He received 2 months of nivolumab adjuvantly therapy discontinued because of acute respiratory symptoms.  Risks and benefits of restarting nivolumab versus BRAF targeted therapy versus observation was discussed today.  He is refusing a restart of nivolumab which I certainly understand at this time.  It is reasonable that he has developed pneumonitis on top of a possible infectious pneumonia but could have contributed to his respiratory compromise.  Given these findings, I will avoid using immunotherapy for the time being.  After discussion today, I have opted to restage him with a CT scan and will consider BRAF targeted therapy if he develops advanced disease.  He is deferring adjuvant treatment at this time given his recent hospitalization.     2.  Dermatology surveillance: I have asked him to continue with dermatology surveillance at this time.  3.  Immune mediated complications: It is unclear if he developed pneumonitis or bacterial pneumonia related to nivolumab.  We will avoid immune therapy at this time.  4.  CNS surveillance: We will have CT scan of the brain in addition to whole body scan for staging purposes.  5.  Follow-up: We will be in 3 months and sooner if needed.  25  minutes was spent with the patient face-to-face today.  More than 50% of time was dedicated to discussing his disease status, reviewing imaging studies and answering questions regarding future plan of care.    Zola Button, MD 4/3/20209:14 AM

## 2019-03-05 DIAGNOSIS — Z7982 Long term (current) use of aspirin: Secondary | ICD-10-CM | POA: Diagnosis not present

## 2019-03-05 DIAGNOSIS — I4891 Unspecified atrial fibrillation: Secondary | ICD-10-CM | POA: Diagnosis not present

## 2019-03-05 DIAGNOSIS — Z8701 Personal history of pneumonia (recurrent): Secondary | ICD-10-CM | POA: Diagnosis not present

## 2019-03-05 DIAGNOSIS — Z950 Presence of cardiac pacemaker: Secondary | ICD-10-CM | POA: Diagnosis not present

## 2019-03-05 DIAGNOSIS — E1165 Type 2 diabetes mellitus with hyperglycemia: Secondary | ICD-10-CM | POA: Diagnosis not present

## 2019-03-05 DIAGNOSIS — I2510 Atherosclerotic heart disease of native coronary artery without angina pectoris: Secondary | ICD-10-CM | POA: Diagnosis not present

## 2019-03-05 DIAGNOSIS — J9601 Acute respiratory failure with hypoxia: Secondary | ICD-10-CM | POA: Diagnosis not present

## 2019-03-05 DIAGNOSIS — Z7984 Long term (current) use of oral hypoglycemic drugs: Secondary | ICD-10-CM | POA: Diagnosis not present

## 2019-03-05 DIAGNOSIS — I5023 Acute on chronic systolic (congestive) heart failure: Secondary | ICD-10-CM | POA: Diagnosis not present

## 2019-03-05 LAB — CBC AND DIFFERENTIAL
HCT: 40 — AB (ref 41–53)
Hemoglobin: 13.6 (ref 13.5–17.5)
Neutrophils Absolute: 4
Platelets: 157 (ref 150–399)
WBC: 5.2

## 2019-03-05 LAB — BASIC METABOLIC PANEL
BUN: 41 — AB (ref 4–21)
Creatinine: 0.9 (ref 0.6–1.3)
Glucose: 192
Potassium: 4.8 (ref 3.4–5.3)
Sodium: 137 (ref 137–147)

## 2019-03-06 ENCOUNTER — Telehealth: Payer: Self-pay | Admitting: *Deleted

## 2019-03-06 DIAGNOSIS — Z7984 Long term (current) use of oral hypoglycemic drugs: Secondary | ICD-10-CM | POA: Diagnosis not present

## 2019-03-06 DIAGNOSIS — J9601 Acute respiratory failure with hypoxia: Secondary | ICD-10-CM | POA: Diagnosis not present

## 2019-03-06 DIAGNOSIS — I4891 Unspecified atrial fibrillation: Secondary | ICD-10-CM | POA: Diagnosis not present

## 2019-03-06 DIAGNOSIS — Z7982 Long term (current) use of aspirin: Secondary | ICD-10-CM | POA: Diagnosis not present

## 2019-03-06 DIAGNOSIS — E1165 Type 2 diabetes mellitus with hyperglycemia: Secondary | ICD-10-CM | POA: Diagnosis not present

## 2019-03-06 DIAGNOSIS — Z8701 Personal history of pneumonia (recurrent): Secondary | ICD-10-CM | POA: Diagnosis not present

## 2019-03-06 DIAGNOSIS — I2510 Atherosclerotic heart disease of native coronary artery without angina pectoris: Secondary | ICD-10-CM | POA: Diagnosis not present

## 2019-03-06 DIAGNOSIS — I5023 Acute on chronic systolic (congestive) heart failure: Secondary | ICD-10-CM | POA: Diagnosis not present

## 2019-03-06 DIAGNOSIS — Z950 Presence of cardiac pacemaker: Secondary | ICD-10-CM | POA: Diagnosis not present

## 2019-03-06 NOTE — Telephone Encounter (Signed)
Copied from [place] [id number]. Topic: General - Other >> Mar 05, 2019 10:08 AM Leward Quan A wrote: Reason for CRM: Patient called to say that he was supposed to have some blood drawn but the nurse from [facility] did not come in because she faxed over orders to be signed by Dr Idelle Jo on 02/25/2019 but had not received signed orders back. Asking for the orders so that they can go out and get patients blood. Per patient he was to have a BMP, BNP and CBC. Please advise Ph# [phone] also can be contacted on MyChart >> Mar 06, 2019 10:34 AM Virl Axe D wrote: Pt's Home Health Nurse Caren Griffins called to get verbal from Dr. Ethlyn Gallery to do pt's blood work. She is at his home. Please advise. CB#[phone] Secure VM

## 2019-03-06 NOTE — Telephone Encounter (Signed)
LM for nurse giving the ok for verbal orders.   CRM created.

## 2019-03-08 ENCOUNTER — Other Ambulatory Visit: Payer: Self-pay

## 2019-03-08 ENCOUNTER — Encounter: Payer: Self-pay | Admitting: Family Medicine

## 2019-03-08 MED ORDER — EMPAGLIFLOZIN 10 MG PO TABS: 10.0000 mg | ORAL_TABLET | Freq: Every day | ORAL | 2 refills | Status: DC

## 2019-03-09 DIAGNOSIS — I4891 Unspecified atrial fibrillation: Secondary | ICD-10-CM | POA: Diagnosis not present

## 2019-03-09 DIAGNOSIS — E1165 Type 2 diabetes mellitus with hyperglycemia: Secondary | ICD-10-CM | POA: Diagnosis not present

## 2019-03-09 DIAGNOSIS — Z7984 Long term (current) use of oral hypoglycemic drugs: Secondary | ICD-10-CM | POA: Diagnosis not present

## 2019-03-09 DIAGNOSIS — Z950 Presence of cardiac pacemaker: Secondary | ICD-10-CM | POA: Diagnosis not present

## 2019-03-09 DIAGNOSIS — J9601 Acute respiratory failure with hypoxia: Secondary | ICD-10-CM | POA: Diagnosis not present

## 2019-03-09 DIAGNOSIS — Z7982 Long term (current) use of aspirin: Secondary | ICD-10-CM | POA: Diagnosis not present

## 2019-03-09 DIAGNOSIS — I5023 Acute on chronic systolic (congestive) heart failure: Secondary | ICD-10-CM | POA: Diagnosis not present

## 2019-03-09 DIAGNOSIS — I2510 Atherosclerotic heart disease of native coronary artery without angina pectoris: Secondary | ICD-10-CM | POA: Diagnosis not present

## 2019-03-09 DIAGNOSIS — Z8701 Personal history of pneumonia (recurrent): Secondary | ICD-10-CM | POA: Diagnosis not present

## 2019-03-12 NOTE — Telephone Encounter (Signed)
Results for this blood work have been received.

## 2019-03-13 ENCOUNTER — Telehealth: Payer: Self-pay

## 2019-03-13 DIAGNOSIS — J9601 Acute respiratory failure with hypoxia: Secondary | ICD-10-CM | POA: Diagnosis not present

## 2019-03-13 DIAGNOSIS — Z7982 Long term (current) use of aspirin: Secondary | ICD-10-CM | POA: Diagnosis not present

## 2019-03-13 DIAGNOSIS — Z950 Presence of cardiac pacemaker: Secondary | ICD-10-CM | POA: Diagnosis not present

## 2019-03-13 DIAGNOSIS — I5023 Acute on chronic systolic (congestive) heart failure: Secondary | ICD-10-CM | POA: Diagnosis not present

## 2019-03-13 DIAGNOSIS — E1165 Type 2 diabetes mellitus with hyperglycemia: Secondary | ICD-10-CM | POA: Diagnosis not present

## 2019-03-13 DIAGNOSIS — I2510 Atherosclerotic heart disease of native coronary artery without angina pectoris: Secondary | ICD-10-CM | POA: Diagnosis not present

## 2019-03-13 DIAGNOSIS — Z8701 Personal history of pneumonia (recurrent): Secondary | ICD-10-CM | POA: Diagnosis not present

## 2019-03-13 DIAGNOSIS — I4891 Unspecified atrial fibrillation: Secondary | ICD-10-CM | POA: Diagnosis not present

## 2019-03-13 DIAGNOSIS — Z7984 Long term (current) use of oral hypoglycemic drugs: Secondary | ICD-10-CM | POA: Diagnosis not present

## 2019-03-13 NOTE — Telephone Encounter (Signed)
Spoke with the patient per Dr. Quillian Quince request. He stated that his breathing is back to normal and the swelling has been gone for 10 days. He stated he still has some fatigue and has not gained any weight back.  He would like to know if he should continue the lasix.

## 2019-03-14 DIAGNOSIS — I5023 Acute on chronic systolic (congestive) heart failure: Secondary | ICD-10-CM | POA: Diagnosis not present

## 2019-03-14 DIAGNOSIS — I4891 Unspecified atrial fibrillation: Secondary | ICD-10-CM | POA: Diagnosis not present

## 2019-03-14 DIAGNOSIS — Z950 Presence of cardiac pacemaker: Secondary | ICD-10-CM | POA: Diagnosis not present

## 2019-03-14 DIAGNOSIS — Z7982 Long term (current) use of aspirin: Secondary | ICD-10-CM | POA: Diagnosis not present

## 2019-03-14 DIAGNOSIS — Z8701 Personal history of pneumonia (recurrent): Secondary | ICD-10-CM | POA: Diagnosis not present

## 2019-03-14 DIAGNOSIS — Z7984 Long term (current) use of oral hypoglycemic drugs: Secondary | ICD-10-CM | POA: Diagnosis not present

## 2019-03-14 DIAGNOSIS — J9601 Acute respiratory failure with hypoxia: Secondary | ICD-10-CM | POA: Diagnosis not present

## 2019-03-14 DIAGNOSIS — I2510 Atherosclerotic heart disease of native coronary artery without angina pectoris: Secondary | ICD-10-CM | POA: Diagnosis not present

## 2019-03-14 DIAGNOSIS — E1165 Type 2 diabetes mellitus with hyperglycemia: Secondary | ICD-10-CM | POA: Diagnosis not present

## 2019-03-14 NOTE — Telephone Encounter (Signed)
At this point my recommendation would be just to use the lasix on an as needed basis - if swelling in lower legs or if cough/breathing issue. However, I would like for him to touch base with cardiology to make sure they are in agreement with this.   Unfortunately we didn't get that updated BNP due to processing with his home blood draw, but cardiology should be able to monitor his fluid overload from his implanted device so can give a more informed suggestion.

## 2019-03-14 NOTE — Telephone Encounter (Signed)
Spoke with patient. He stated he would send cardiology a mychart message.

## 2019-03-15 ENCOUNTER — Telehealth: Payer: Self-pay

## 2019-03-15 MED ORDER — MEXILETINE HCL 150 MG PO CAPS: ORAL_CAPSULE | ORAL | 3 refills | Status: DC

## 2019-03-15 NOTE — Telephone Encounter (Signed)
Pt called back through main number, again agitated and short with staff.  Operator advised Pt that everyone was working remotely, and there may be a delay in call backs.  This nurse spoke with Pt.  Pt states last night he had skipping irregular beats with dizziness.  States he sent a remote this morning and wanted to know why he had not been called yet with results.  Advised transmissions took time to be reviewed.    At this time Pt states he no longer has skipped beats but still feels light headed.  BP 106/54 with pulse of 84.  Will review remote and call pt back.  Advised Pt this  Nurse would call  Him back TODAY but may not be until later today.  Pt indicates he understands we are "not his only patient but I'm the only patient I care about".  Advised Pt to relax and he did not need to monitor his pulse every 10 minutes.  Will discuss with device clinic and return Pt call.

## 2019-03-15 NOTE — Telephone Encounter (Signed)
Pt called stating he sent a transmission at 7:45 am and have not heard from anyone. He is very agitated. I tried to explain that we did not see the transmission yet because we are in a meeting. He stated that he wants to speak with the supervisor. I told him that Amber or Claiborne Billings will give him a call back soon.

## 2019-03-15 NOTE — Telephone Encounter (Signed)
  Reviewed manual transmission from 03/15/19 at 07:36. Presenting rhythm shows VT at 125bpm. VT-1 zone programmed at 140bpm so VT is not being detected by device. Dr. Lovena Le and Sonia Baller, RN, made aware of rhythm as of this transmission. No atrial or ventricular arrhythmias have been detected by device. Histograms appropriate.

## 2019-03-15 NOTE — Telephone Encounter (Signed)
Per Dr. Lovena Le- Have Pt increase Mexilitine to 300 mg in the Am, then 150 afternoon and 150 evening.  Per Pt he remembered that last night he took his mexilitine 3 hours earlier than he normally does.  Advised to increase per orders.  Advised if he had further heart racing today he can take an extra mexilitine whenever that happens regardless of time.  Pt indicates understanding.  Advised to MyChart this nurse right away if he ever needs to send another transmission for irregular hearbeat.

## 2019-03-16 ENCOUNTER — Encounter: Payer: Self-pay | Admitting: Family Medicine

## 2019-03-19 ENCOUNTER — Telehealth: Payer: Self-pay

## 2019-03-19 ENCOUNTER — Telehealth: Payer: Self-pay | Admitting: Family Medicine

## 2019-03-19 NOTE — Telephone Encounter (Signed)
Returned call to Pt in response to MyChart message sent 03/16/2019.  Per Pt after further reflection, he had started drinking zero sugar Pepsi and this is about the same time the VT started.  Per Pt he does not normally drink caffeine.  States he has felt better since stopping the pepsi.  Advised Pt that Dr. Lovena Le would like him to increase his amidarone to 400 mg daily for one month.  Pt will see how he feels today.  If he has any further tachycardia he will increase the amiodarone and let this nurse know.  Pt also thinks he is having trouble sending transmissions.  Thinks his box isn't working.  Sent message to device clinic to see if we are receiving remotes.

## 2019-03-19 NOTE — Telephone Encounter (Signed)
I called patient to review concerns that he sent via my chart on Friday.  He states that he started to feel somewhat anxious on Friday just thinking about the coronavirus.  He states he is not sick.  He states that he feels fine overall.  His endurance and stamina are improving, but are certainly not back to baseline.  He is continuing with therapy weekly.  He feels like he is eating a lot, but not gaining weight.  Weight has been stable around 154 pounds.  Prior to hospitalization he was 188 pounds.  We reviewed ways to get some extra calories into breakfast as well as extra protein including doing higher protein type waffles, oatmeal, or protein spreads on toast or waffles.  I encouraged him to let me know if he has any additional concerns.  As of now his breathing is very good, he no longer has any edema.  Mood is stable and anxiety is better today than it was last week.  We discussed limitations of COVID-19 IgG testing at this point.  I do not think this would aid in management of his condition.  It is possible that he was infected with COVID-19, but this was not tested for during his hospitalization.  We reviewed this together.  He will keep me posted with concerns.

## 2019-03-20 DIAGNOSIS — I4891 Unspecified atrial fibrillation: Secondary | ICD-10-CM | POA: Diagnosis not present

## 2019-03-20 DIAGNOSIS — J9601 Acute respiratory failure with hypoxia: Secondary | ICD-10-CM | POA: Diagnosis not present

## 2019-03-20 DIAGNOSIS — Z8701 Personal history of pneumonia (recurrent): Secondary | ICD-10-CM | POA: Diagnosis not present

## 2019-03-20 DIAGNOSIS — E1165 Type 2 diabetes mellitus with hyperglycemia: Secondary | ICD-10-CM | POA: Diagnosis not present

## 2019-03-20 DIAGNOSIS — Z950 Presence of cardiac pacemaker: Secondary | ICD-10-CM | POA: Diagnosis not present

## 2019-03-20 DIAGNOSIS — I5023 Acute on chronic systolic (congestive) heart failure: Secondary | ICD-10-CM | POA: Diagnosis not present

## 2019-03-20 DIAGNOSIS — Z7984 Long term (current) use of oral hypoglycemic drugs: Secondary | ICD-10-CM | POA: Diagnosis not present

## 2019-03-20 DIAGNOSIS — Z7982 Long term (current) use of aspirin: Secondary | ICD-10-CM | POA: Diagnosis not present

## 2019-03-20 DIAGNOSIS — I2510 Atherosclerotic heart disease of native coronary artery without angina pectoris: Secondary | ICD-10-CM | POA: Diagnosis not present

## 2019-03-21 DIAGNOSIS — Z950 Presence of cardiac pacemaker: Secondary | ICD-10-CM | POA: Diagnosis not present

## 2019-03-21 DIAGNOSIS — Z8701 Personal history of pneumonia (recurrent): Secondary | ICD-10-CM | POA: Diagnosis not present

## 2019-03-21 DIAGNOSIS — J9601 Acute respiratory failure with hypoxia: Secondary | ICD-10-CM | POA: Diagnosis not present

## 2019-03-21 DIAGNOSIS — Z7984 Long term (current) use of oral hypoglycemic drugs: Secondary | ICD-10-CM | POA: Diagnosis not present

## 2019-03-21 DIAGNOSIS — E1165 Type 2 diabetes mellitus with hyperglycemia: Secondary | ICD-10-CM | POA: Diagnosis not present

## 2019-03-21 DIAGNOSIS — Z7982 Long term (current) use of aspirin: Secondary | ICD-10-CM | POA: Diagnosis not present

## 2019-03-21 DIAGNOSIS — I2510 Atherosclerotic heart disease of native coronary artery without angina pectoris: Secondary | ICD-10-CM | POA: Diagnosis not present

## 2019-03-21 DIAGNOSIS — I4891 Unspecified atrial fibrillation: Secondary | ICD-10-CM | POA: Diagnosis not present

## 2019-03-21 DIAGNOSIS — I5023 Acute on chronic systolic (congestive) heart failure: Secondary | ICD-10-CM | POA: Diagnosis not present

## 2019-03-22 NOTE — Telephone Encounter (Signed)
Ria Comment - see his message.   Not sure if home nurse would go back out to do bloodwork/urine testing? But if so we could order:  [id number] testing for covid 19 IgG, urinalysis with culture,cmp   I suspect he is urinating more as his body is still working to rebalance fluids. Also, with the weight loss in the hospital he lost a lot of protein and he has to work to regain this which will help keep fluid better balanced. However, since he is going so much and noticed odor we should check urine to make sure that there is no underlying infection. His kidney function looked really good last time we checked.

## 2019-03-23 NOTE — Telephone Encounter (Signed)
Order has been called in.

## 2019-03-26 ENCOUNTER — Encounter: Payer: Self-pay | Admitting: Internal Medicine

## 2019-03-26 MED ORDER — AMIODARONE HCL 200 MG PO TABS: 200.0000 mg | ORAL_TABLET | Freq: Two times a day (BID) | ORAL | 0 refills | Status: DC

## 2019-03-26 NOTE — Addendum Note (Signed)
Addended by: Willeen Cass A on: 03/26/2019 10:47 AM   Modules accepted: Orders

## 2019-03-26 NOTE — Telephone Encounter (Signed)
Ria Comment:   Please relay to him that order was called in and whom you were in contact with for this and contact for him if needed.

## 2019-03-26 NOTE — Telephone Encounter (Signed)
Returned call to Pt.  Call went to VM.  Advised to take another amiodarone now.  Advised starting tomorrow he could take his amiodarone 200 mg one tablet by mouth twice a day.  Refill sent to  Pharmacy.  Also sent message via mychart.

## 2019-03-26 NOTE — Progress Notes (Signed)
Unscheduled Latitude transmission received. Presenting rhythm slow VT - Sonia Baller has called patient and advised to take amiodarone. He his symptomatic with irregular heart beat but otherwise is stable. Per Dr Lovena Le, plan to follow up tomorrow.

## 2019-03-27 ENCOUNTER — Telehealth: Payer: Self-pay | Admitting: *Deleted

## 2019-03-27 NOTE — Telephone Encounter (Signed)
Patient calling back with a few questions or clarification about the new way taking his medications.  Should he only do this once in a 24 hour cycle or how many times can he repeat this cycle in one 24 hour period?

## 2019-03-27 NOTE — Telephone Encounter (Signed)
Reviewed Dr. Tanna Furry instructions with patient again. He asked if he needs to alter scheduled medication times based on if he takes an extra dose. Advised to keep current dosing schedule for amiodarone (06:00 and 18:00) and mexiletine (06:00, 14:00, bedtime), with the extra dose of each medication PRN as per previous instructions. Plan to take only one PRN dose of amio and mexiletine per 24hr period for now and call for further instructions if extra doses are unsuccessful at converting rhythm. Advised Dr. Lovena Le may update instructions later depending on how he is doing. Pt verbalizes understanding and denies questions or concerns at this time.

## 2019-03-27 NOTE — Telephone Encounter (Signed)
Spoke with patient due to presenting rhythm on Latitude transmission from today at 02:17 showing slow VT. Patient reports he woke up at that time feeling heart fluttering. He took a total of 400mg  amiodarone yesterday AM (split dose) as instructed. He took 200mg  of amio today around 02:00 when he woke up and took an additional 200mg  around 06:00. Patient has been taking mexiletine 150mg  TID.  As of about 08:00, patient feels like he is back in normal rhythm. He agrees to send a transmission for confirmation and requests recommendations regarding his amio as he already took a total of 400mg  today. Advised I will call back after discussing with Dr. Lovena Le. Pt is agreeable to this plan and thanked me for my call.  Transmission received. Presenting rhythm as of 09:23 shows NSR @ 75bpm. No episodes detected by device.

## 2019-03-27 NOTE — Telephone Encounter (Signed)
Spoke with Dr. Lovena Le, received the following recommendations: If heart racing persists >1hr, take extra 200mg  amiodarone. If still racing an hour after extra amio, take an extra 150mg  mexiletine. If severe chest pain, passing out, ShOB, or worsening symptoms--call 911 and go to ED.  Patient made aware of recommendations. Verbalizes understanding of all instructions, aware to keep on current schedule with amiodarone and mexiletine with extra doses PRN as listed above. Pt agrees to call our office or send message along with a manual ICD transmission if extra amio and mexiletine doses are unsuccessful in converting his rhythm. He is aware to seek emergency medical attention for new or worsening cardiac symptoms. [place] phone number for any device-related questions. Pt requests f/u with Dr. Lovena Le sooner than 07/04/19. Advised I will make Sonia Baller, RN, aware. He is agreeable to plan and thanked me for my call.

## 2019-03-28 NOTE — Telephone Encounter (Signed)
Returned call to Pt per MyChart request.  Pt wanted [place] to know about his attempted upgrade to CRT D in [place] in 2019 and also attempted ablation at that time.  Neither surgery was successful per Pt.  Pt to email records.  Also Pt concerned his remote receiver is not working.  Joey with BS to call Pt.

## 2019-03-30 ENCOUNTER — Encounter: Payer: Self-pay | Admitting: Family Medicine

## 2019-03-30 DIAGNOSIS — Z950 Presence of cardiac pacemaker: Secondary | ICD-10-CM | POA: Diagnosis not present

## 2019-03-30 DIAGNOSIS — Z7984 Long term (current) use of oral hypoglycemic drugs: Secondary | ICD-10-CM | POA: Diagnosis not present

## 2019-03-30 DIAGNOSIS — I5023 Acute on chronic systolic (congestive) heart failure: Secondary | ICD-10-CM | POA: Diagnosis not present

## 2019-03-30 DIAGNOSIS — E1165 Type 2 diabetes mellitus with hyperglycemia: Secondary | ICD-10-CM | POA: Diagnosis not present

## 2019-03-30 DIAGNOSIS — J9601 Acute respiratory failure with hypoxia: Secondary | ICD-10-CM | POA: Diagnosis not present

## 2019-03-30 DIAGNOSIS — I4891 Unspecified atrial fibrillation: Secondary | ICD-10-CM | POA: Diagnosis not present

## 2019-03-30 DIAGNOSIS — N390 Urinary tract infection, site not specified: Secondary | ICD-10-CM | POA: Diagnosis not present

## 2019-03-30 DIAGNOSIS — I2510 Atherosclerotic heart disease of native coronary artery without angina pectoris: Secondary | ICD-10-CM | POA: Diagnosis not present

## 2019-03-30 DIAGNOSIS — Z7982 Long term (current) use of aspirin: Secondary | ICD-10-CM | POA: Diagnosis not present

## 2019-03-30 DIAGNOSIS — Z8701 Personal history of pneumonia (recurrent): Secondary | ICD-10-CM | POA: Diagnosis not present

## 2019-03-30 NOTE — Telephone Encounter (Signed)
Could you go back through phone notes and previous orders that Ria Comment took care of? We were trying to get his urine tested by home nurse along with a couple of other labs. He is home after very long and complicated hospitalization. Home nurse previously drew blood for [place]. Ria Comment said she had sent order, but I don't know details. Please see if you can get in touch w him and then with current home nurse. He should have her contact info if you can't easily find it.

## 2019-03-30 NOTE — Telephone Encounter (Signed)
I called the pt and he stated the nurse who ordered labs on 4/6 did not order one test and he is not sure what this test was.  Patient also stated he noticed discoloration and odorous urine and was to have this tested by a nurse last week at his home and no one contacted him.  I called [facility] at [phone] and spoke with Reuben Likes the care manager and informed her of this as the notes in the chart stated the pt was to have a BMP, BNP and a CBC and there was no results for a BNP in the pts chart.  Reuben Likes stated there may have been an error with the lab and stated they can go to the pts home today or tomorrow.  Dr Ethlyn Gallery was informed of this and stated the pt does not need a BNP.  Dr Ethlyn Gallery requested orders for: IGG Covid [id number], UA, urine culture, and a CMP.  Verbal orders were given to [facility] and the pt was informed someone will come to his home today or tomorrow and will call prior to the visit.

## 2019-04-03 DIAGNOSIS — Z7984 Long term (current) use of oral hypoglycemic drugs: Secondary | ICD-10-CM | POA: Diagnosis not present

## 2019-04-03 DIAGNOSIS — J9601 Acute respiratory failure with hypoxia: Secondary | ICD-10-CM | POA: Diagnosis not present

## 2019-04-03 DIAGNOSIS — Z7982 Long term (current) use of aspirin: Secondary | ICD-10-CM | POA: Diagnosis not present

## 2019-04-03 DIAGNOSIS — I2510 Atherosclerotic heart disease of native coronary artery without angina pectoris: Secondary | ICD-10-CM | POA: Diagnosis not present

## 2019-04-03 DIAGNOSIS — I5023 Acute on chronic systolic (congestive) heart failure: Secondary | ICD-10-CM | POA: Diagnosis not present

## 2019-04-03 DIAGNOSIS — Z950 Presence of cardiac pacemaker: Secondary | ICD-10-CM | POA: Diagnosis not present

## 2019-04-03 DIAGNOSIS — Z8701 Personal history of pneumonia (recurrent): Secondary | ICD-10-CM | POA: Diagnosis not present

## 2019-04-03 DIAGNOSIS — I4891 Unspecified atrial fibrillation: Secondary | ICD-10-CM | POA: Diagnosis not present

## 2019-04-03 DIAGNOSIS — E1165 Type 2 diabetes mellitus with hyperglycemia: Secondary | ICD-10-CM | POA: Diagnosis not present

## 2019-04-04 ENCOUNTER — Other Ambulatory Visit: Payer: Self-pay

## 2019-04-04 ENCOUNTER — Ambulatory Visit: Payer: Medicare HMO | Admitting: Internal Medicine

## 2019-04-04 ENCOUNTER — Encounter: Payer: Self-pay | Admitting: Internal Medicine

## 2019-04-04 ENCOUNTER — Encounter: Payer: Medicare HMO | Admitting: Internal Medicine

## 2019-04-04 VITALS — BP 124/62 | HR 76 | Ht 71.0 in | Wt 165.0 lb

## 2019-04-04 DIAGNOSIS — I5022 Chronic systolic (congestive) heart failure: Secondary | ICD-10-CM | POA: Diagnosis not present

## 2019-04-04 DIAGNOSIS — I255 Ischemic cardiomyopathy: Secondary | ICD-10-CM | POA: Diagnosis not present

## 2019-04-04 DIAGNOSIS — I472 Ventricular tachycardia, unspecified: Secondary | ICD-10-CM

## 2019-04-04 DIAGNOSIS — Z95810 Presence of automatic (implantable) cardiac defibrillator: Secondary | ICD-10-CM

## 2019-04-04 LAB — CUP PACEART INCLINIC DEVICE CHECK
Brady Statistic RA Percent Paced: 19 %
Brady Statistic RV Percent Paced: 1 %
Date Time Interrogation Session: 20200506040000
HighPow Impedance: 54 Ohm
Implantable Lead Implant Date: 20050203
Implantable Lead Implant Date: 20190510
Implantable Lead Location: 753859
Implantable Lead Location: 753860
Implantable Lead Model: 5076
Implantable Lead Model: 675
Implantable Lead Serial Number: 500562
Implantable Pulse Generator Implant Date: 20190510
Lead Channel Impedance Value: 3000 Ohm
Lead Channel Impedance Value: 400 Ohm
Lead Channel Impedance Value: 568 Ohm
Lead Channel Pacing Threshold Amplitude: 0.8 V
Lead Channel Pacing Threshold Amplitude: 1 V
Lead Channel Pacing Threshold Pulse Width: 0.4 ms
Lead Channel Pacing Threshold Pulse Width: 0.4 ms
Lead Channel Sensing Intrinsic Amplitude: 13.1 mV
Lead Channel Sensing Intrinsic Amplitude: 7.3 mV
Lead Channel Setting Pacing Amplitude: 0.1 V
Lead Channel Setting Pacing Amplitude: 2 V
Lead Channel Setting Pacing Amplitude: 2 V
Lead Channel Setting Pacing Pulse Width: 0.1 ms
Lead Channel Setting Pacing Pulse Width: 0.4 ms
Lead Channel Setting Sensing Sensitivity: 0.6 mV
Lead Channel Setting Sensing Sensitivity: 1 mV
Pulse Gen Serial Number: 492955

## 2019-04-04 NOTE — Progress Notes (Signed)
HPI Lee Hall returns today for followup. He is a pleasant 68 yo man with an ICM, s/p anterior MI remotely, with VT, s/p 2 VT ablations in [place], who has moved to [facility]. The patient has had increasing amounts of slow VT below his detection rate, typically in the 110-130 rate. His VT detections start at 140/min. The patient has not had syncope and when he goes into VT, he feels weaker but no syncope or chest pain or sob. Tired. No Known Allergies   Current Outpatient Medications  Medication Sig Dispense Refill  . acyclovir (ZOVIRAX) 800 MG tablet Take 400 mg by mouth once a week.     Marland Kitchen amiodarone (PACERONE) 200 MG tablet Take 1 tablet (200 mg total) by mouth 2 (two) times daily for 30 days. 60 tablet 0  . aspirin 81 MG tablet Take 81 mg by mouth daily.      . blood glucose meter kit and supplies Dispense based on patient and insurance preference. Use up to four times daily as directed. (FOR ICD-10 E10.9, E11.9). 1 each 0  . carvedilol (COREG) 6.25 MG tablet Take 0.5 tablets (3.125 mg total) by mouth 2 (two) times daily with a meal. On 02/19/2019 increase to 1 tab (6.25 mg total) twice daily. 60 tablet 0  . empagliflozin (JARDIANCE) 10 MG TABS tablet Take 10 mg by mouth daily. 30 tablet 2  . ipratropium (ATROVENT) 0.06 % nasal spray Place 2 sprays into both nostrils 3 (three) times daily. 90 mL 3  . metFORMIN (GLUCOPHAGE-XR) 500 MG 24 hr tablet Take 500 mg by mouth 2 (two) times daily.     Marland Kitchen mexiletine (MEXITIL) 150 MG capsule Take 150 mg by mouth 3 (three) times daily.    . sacubitril-valsartan (ENTRESTO) 24-26 MG Take 1 tablet by mouth 2 (two) times daily. 60 tablet 11  . Spacer/Aero-Holding Chambers (E-Z SPACER) inhaler Use as instructed 1 each 2   No current facility-administered medications for this visit.      Past Medical History:  Diagnosis Date  . Anxiety   . CAD (coronary artery disease)   . Diabetes mellitus type II, controlled ([place])   . Heart failure, systolic, acute  on chronic ([place])   . ICD (implantable cardiac defibrillator), single, in situ   . ISCHEMIC CARDIOMYOPATHY 03/19/2009   Qualifier: Diagnosis of  By: Boyce Medici RN, BSN, [place]    . LEFT VENTRICULAR MURAL THROMBUS 02/18/2009   Qualifier: Diagnosis of  By: Stevie Kern NP-C, Sharyn Lull    . Melanoma ([place]) 2019  . MI (myocardial infarction) ([place]) 08/2003  . PAROXYSMAL VENTRICULAR TACHYCARDIA 03/19/2009   Qualifier: Diagnosis of  By: Boyce Medici RN, BSN, [place]    . Pneumonia 02/08/2019  . SYSTOLIC HEART FAILURE, ACUTE ON CHRONIC 01/02/2009   Qualifier: Diagnosis of  By: Stevie Kern NP-C, Sharyn Lull    . Ventricular tachycardia ([place])     ROS:   All systems reviewed and negative except as noted in the HPI.   Past Surgical History:  Procedure Laterality Date  . ABLATION  03/2018   IN [place]  . AXILLARY LYMPH NODE DISSECTION Right 10/05/2018   RIGHT AXILLARY LYMPH NODE DISSECTION   . AXILLARY LYMPH NODE DISSECTION Right 10/05/2018   Procedure: RIGHT AXILLARY LYMPH NODE DISSECTION;  Surgeon: Stark Klein, MD;  Location: [place];  Service: General;  Laterality: Right;  . CARDIAC CATHETERIZATION     01/30/18 [facility]): Occluded LAD. Patent LCX and RCA. Normal LVEDP 14 mmHg. Medical managment.  Randolm Idol /  REPLACE / REMOVE PACEMAKER    . SKIN BIOPSY  2019   melanoma R shoulder  . TONSILLECTOMY AND ADENOIDECTOMY  1969     Family History  Problem Relation Age of Onset  . Alzheimer's disease Mother   . Kidney Stones Father   . Diabetes Father   . Alcohol abuse Father   . Alzheimer's disease Maternal Grandmother      Social History   Socioeconomic History  . Marital status: Single    Spouse name: Not on file  . Number of children: Not on file  . Years of education: Not on file  . Highest education level: Not on file  Occupational History  . Occupation: Unemployed since 10/2008  Social Needs  . Financial resource strain: Not on file  . Food insecurity:    Worry: Not on file    Inability: Not  on file  . Transportation needs:    Medical: Not on file    Non-medical: Not on file  Tobacco Use  . Smoking status: Former Smoker    Packs/day: 1.00    Years: 20.00    Pack years: 20.00    Types: Cigars, Cigarettes    Last attempt to quit: 02/22/2004    Years since quitting: 15.1  . Smokeless tobacco: Never Used  Substance and Sexual Activity  . Alcohol use: Yes    Comment: 3 x week  . Drug use: No  . Sexual activity: Not Currently  Lifestyle  . Physical activity:    Days per week: Not on file    Minutes per session: Not on file  . Stress: Not on file  Relationships  . Social connections:    Talks on phone: Not on file    Gets together: Not on file    Attends religious service: Not on file    Active member of club or organization: Not on file    Attends meetings of clubs or organizations: Not on file    Relationship status: Not on file  . Intimate partner violence:    Fear of current or ex partner: Not on file    Emotionally abused: Not on file    Physically abused: Not on file    Forced sexual activity: Not on file  Other Topics Concern  . Not on file  Social History Narrative   Single   Lives with sister   No regular exercise     BP 124/62   Pulse 76   Ht '5\' 11"'  (1.803 m)   Wt 165 lb (74.8 kg)   BMI 23.01 kg/m   Physical Exam:  Well appearing 68 yo man, NAD HEENT: Unremarkable Neck:  No JVD, no thyromegally Lymphatics:  No adenopathy Back:  No CVA tenderness Lungs:  Clear with no wheezes HEART:  Regular rate rhythm, no murmurs, no rubs, no clicks Abd:  soft, positive bowel sounds, no organomegally, no rebound, no guarding Ext:  2 plus pulses, no edema, no cyanosis, no clubbing Skin:  No rashes no nodules Neuro:  CN II through XII intact, motor grossly intact  EKG - nsr with LBBB  DEVICE  Normal device function.  See PaceArt for details.   Assess/Plan: 1. VT - I discussed the treatment options. Because he has already had 2 unsuccessful VT  ablations, I am reluctant to recommend another. He will continue amio and mexitil.  2. Chronic systolic heart failure - his symptoms are class 2. He has a biv ICD without an LV lead. Unclear why. Procedure performed  in [place]. 3. CAD - he is s/p MI. He denies anginal symptoms.  4. ICD - interogation of his device demonstrates normal device function.   Mikle Bosworth.D.

## 2019-04-04 NOTE — Patient Instructions (Addendum)
Medication Instructions:  Your physician recommends that you continue on your current medications as directed. Please refer to the Current Medication list given to you today.  Labwork: You will get lab work today:  TSH and free T4  Testing/Procedures: None ordered.  Follow-Up: Your physician wants you to follow-up in: 3 months with Dr. Lovena Le.     July 20, 2019 at 3:00 pm  Remote monitoring is used to monitor your ICD from home. This monitoring reduces the number of office visits required to check your device to one time per year. It allows [place] to keep an eye on the functioning of your device to ensure it is working properly. You are scheduled for a device check from home on 04/24/2019. You may send your transmission at any time that day. If you have a wireless device, the transmission will be sent automatically. After your physician reviews your transmission, you will receive a postcard with your next transmission date.  Any Other Special Instructions Will Be Listed Below (If Applicable).  If you need a refill on your cardiac medications before your next appointment, please call your pharmacy.

## 2019-04-05 LAB — T4, FREE: Free T4: 1.24 ng/dL (ref 0.82–1.77)

## 2019-04-05 LAB — TSH: TSH: 5.38 u[IU]/mL — ABNORMAL HIGH (ref 0.450–4.500)

## 2019-04-06 ENCOUNTER — Other Ambulatory Visit: Payer: Self-pay | Admitting: Family Medicine

## 2019-04-06 MED ORDER — CEPHALEXIN 500 MG PO CAPS: 500.0000 mg | ORAL_CAPSULE | Freq: Two times a day (BID) | ORAL | 0 refills | Status: DC

## 2019-04-06 NOTE — Progress Notes (Signed)
I called patient by phone to review bloodwork.   He is still noting cloudiness of urine and odor to urine. This has not changed since first reporting.   Feeling well overall, strength is back up to 80 % per patient.   No other sick symptoms.   We will treat with keflex. Discussed with patient to monitor sx and report back if not noting improvement in 3 days time. OK to stop after 5 days if urination appears back to normal. If any concerns we can repeat culture.

## 2019-04-10 NOTE — Telephone Encounter (Signed)
Attempted ICM intro call and sounds like phone was answered but no one would speak.

## 2019-04-12 ENCOUNTER — Other Ambulatory Visit: Payer: Self-pay

## 2019-04-12 ENCOUNTER — Ambulatory Visit: Payer: Medicare HMO | Admitting: Internal Medicine

## 2019-04-12 ENCOUNTER — Encounter: Payer: Self-pay | Admitting: Internal Medicine

## 2019-04-12 VITALS — BP 116/64 | HR 66 | Temp 97.7°F | Ht 70.0 in | Wt 167.0 lb

## 2019-04-12 DIAGNOSIS — R0602 Shortness of breath: Secondary | ICD-10-CM

## 2019-04-12 DIAGNOSIS — R05 Cough: Secondary | ICD-10-CM

## 2019-04-12 DIAGNOSIS — R058 Other specified cough: Secondary | ICD-10-CM

## 2019-04-12 NOTE — Patient Instructions (Signed)
No change in medications  NO MINT OR MENTHOL PRODUCTS SO NO COUGH DROPS  USE SUGARLESS CANDY INSTEAD (Jolley ranchers or Stover's or Life Savers) or even ice chips will also do - the key is to swallow to prevent all throat clearing.   Keep walking daily and let us know if you are losing ground due to breathing   Please schedule a follow up visit in 3 months but call sooner if needed with pfts

## 2019-04-12 NOTE — Progress Notes (Signed)
'@Patient'  ID: Lee Hall, male    DOB: 1951/02/19  MRN: [id number]    Referring provider: [name], MD  HPI:   66 yowm quit smoking 2005  When dx of cardiomyopathy consulted with our team on 02/01/2019 while inpatient with a right lower lobe pneumonia, ischemic cardiomyopathy, chronic systolic heart failure, and septic shock  PMH: CAD, ischemic cardiomyopathy with chronic systolic CHF/LVEF 12% (entresto), VT status post ICD/PPM on amiodarone, melanoma on immunotherapy (nivolaub) presented with dyspnea, leg swelling and hypotension. Smoker/ Smoking History: Former Smoker  Quit 2005.  20-pack-year smoking history. Maintenance: None Pt of: Dr. Melvyn Novas assessment:   02/22/2019  - post hosp f/u ov / NP   Patient was consulted with our practice on 02/01/2019 when he was inpatient for a right lower lobe pneumonia, brief cardiac arrest on 02/01/2019, and septic shock.  This required the patient to be intubated 2 times.  Patient was discharged on 02/14/2019.  Patient reports that since being discharged he has been staying with his sister and brother-in-law at their house.  He reports that his breathing has improved on 100%.    rec No change rx     04/12/2019  Initial pulmonary attending ov/Chalise Pe re: chf / on amiodarone 200 bid  Chief Complaint  Patient presents with  . Follow-up    Breathing has improved back near his normal baseline. No new co's.   Dyspnea:  75% back to baseline ex tol : does 1/3 mile loop in neighborhood 4 x daily at his avg pace but slows down on hills Cough: hoarse/ non productive but sense of assoc pnds some better on atrovent chronically,flared on acei previously and note now on entresto  Sleeping: lie flat ok one pillow  SABA use: none 02: none    No obvious day to day or daytime variability or assoc excess/ purulent sputum or mucus plugs or hemoptysis or cp or chest tightness, subjective wheeze or overt sinus or hb symptoms.   Sleeping as above  without  nocturnal  or early am exacerbation  of respiratory  c/o's or need for noct saba. Also denies any obvious fluctuation of symptoms with weather or environmental changes or other aggravating or alleviating factors except as outlined above   No unusual exposure hx or h/o childhood pna/ asthma or knowledge of premature birth.  Current Allergies, Complete Past Medical History, Past Surgical History, Family History, and Social History were reviewed in [employer] record.  ROS  The following are not active complaints unless bolded Hoarseness, sore throat, dysphagia, dental problems, itching, sneezing,  nasal congestion or discharge of excess mucus or purulent secretions, ear ache,   fever, chills, sweats, unintended wt loss or wt gain, classically pleuritic or exertional cp,  orthopnea pnd or arm/hand swelling  or leg swelling, presyncope, palpitations, abdominal pain, anorexia, nausea, vomiting, diarrhea  or change in bowel habits or change in bladder habits, change in stools or change in urine, dysuria, hematuria,  rash, arthralgias, visual complaints, headache, numbness, weakness or ataxia or problems with walking or coordination,  change in mood or  memory.        Current Meds  Medication Sig  . acyclovir (ZOVIRAX) 800 MG tablet Take 400 mg by mouth once a week.   Marland Kitchen amiodarone (PACERONE) 200 MG tablet Take 1 tablet (200 mg total) by mouth 2 (two) times daily for 30 days.  Marland Kitchen aspirin 81 MG tablet Take 81 mg by mouth daily.    . blood glucose  meter kit and supplies Dispense based on patient and insurance preference. Use up to four times daily as directed. (FOR ICD-10 E10.9, E11.9).  . carvedilol (COREG) 6.25 MG tablet Take 0.5 tablets (3.125 mg total) by mouth 2 (two) times daily with a meal. On 02/19/2019 increase to 1 tab (6.25 mg total) twice daily.  . empagliflozin (JARDIANCE) 10 MG TABS tablet Take 10 mg by mouth daily.  Marland Kitchen ipratropium (ATROVENT) 0.06 % nasal spray Place 2  sprays into both nostrils 3 (three) times daily.  . metFORMIN (GLUCOPHAGE-XR) 500 MG 24 hr tablet Take 500 mg by mouth 2 (two) times daily.   Marland Kitchen mexiletine (MEXITIL) 150 MG capsule Take 150 mg by mouth 3 (three) times daily.  . sacubitril-valsartan (ENTRESTO) 24-26 MG Take 1 tablet by mouth 2 (two) times daily.          Physical Exam  04/12/2019        167  02/22/19 162 lb 12.8 oz (73.8 kg)  02/14/19 168 lb 4.8 oz (76.3 kg)  01/31/19 183 lb 8 oz (83.2 kg)  01/23/19 179 lb 9.6 oz (81.5 kg)  01/19/19 179 lb 6.4 oz (81.4 kg)    Vital signs reviewed - Note on arrival 02 sats  98% on RA    Physical Exam    Hoarse amb thin wm nad / quit verbose with classic voice fatigue/ throat clearing.  HEENT: nl dentition, turbinates bilaterally, and oropharynx s excess pnds or cobblestoing. Nl external ear canals without cough reflex   NECK :  without JVD/Nodes/TM/ nl carotid upstrokes bilaterally   LUNGS: no acc muscle use,  Nl contour chest which is clear to A and P bilaterally without cough on insp or exp maneuvers   CV:  RRR  no s3 or murmur or increase in P2, and no edema   ABD:  soft and nontender with nl inspiratory excursion in the supine position. No bruits or organomegaly appreciated, bowel sounds nl  MS:  Nl gait/ ext warm without deformities, calf tenderness, cyanosis or clubbing No obvious joint restrictions   SKIN: warm and dry without lesions    NEURO:  alert, approp, nl sensorium with  no motor or cerebellar deficits apparent.         I personally reviewed images and agree with radiology impression as follows:  CXR:   02/22/2019 Improved appearance of the lungs, with no evidence of acute cardiopulmonary disease.            Assessment & Plan:

## 2019-04-13 ENCOUNTER — Encounter: Payer: Self-pay | Admitting: Internal Medicine

## 2019-04-13 DIAGNOSIS — R058 Other specified cough: Secondary | ICD-10-CM | POA: Insufficient documentation

## 2019-04-13 DIAGNOSIS — R05 Cough: Secondary | ICD-10-CM | POA: Insufficient documentation

## 2019-04-13 NOTE — Assessment & Plan Note (Addendum)
Pt states on amiodarone 2005-2012  Resumed amiodarone 200 bid 02/2018  - 04/12/2019   Walked RA  2 laps @  approx 285ft each @ mod fast  pace  stopped due to  End of study, no sob or desats    Has  been on amiodarone in past but presently on higher dose with f/u planned by cards and no evidence for amio toxicity at this point -  very encouraged by pt's attitude and ex program at home with progressive improvement to date.   Patients typically have been on amiodarone for 6-12 months before this complication manifests.  Of note, serial clinical evaluation for symptoms such as cough dyspnea or fevers is  the preferred method of monitoring for pulmonary toxicity because a decrease in DLCO or lung volumes is a nonspecific for toxicity. Pathologically amiodarone pulmonary toxicity may appear as interstitial pneumonitis, eosinophilic pneumonia, organizing pneumonia, pulmonary fibrosis or less commonly as diffuse alveolar hemorrhage, pulmonary nodules or pleural effusions.  Risk factors for pulmonary toxicity include age greater than 31, daily dose greater than or  equal to 400 mg, a high cumulative dose(not sure on this point from records about total exposure), or ?pre-existing lung disease  So he has two definite risk factors and needs pfts baseline on return assuming  COVID - 19 restrictions have been lifted to see if any underlying lung condition gives him another risk factor.  >>>   F/u in 3 m with pfts/ sooner if needed

## 2019-04-13 NOTE — Assessment & Plan Note (Signed)
Onset while on ACEi  - on Entresto as of  04/12/2019 and tol ok with use of atrovent to control pnds    Upper airway cough syndrome (previously labeled PNDS),  is so named because it's frequently impossible to sort out how much is  CR/sinusitis with freq throat clearing (which can be related to primary GERD)   vs  causing  secondary (" extra esophageal")  GERD from wide swings in gastric pressure that occur with throat clearing, often  promoting self use of mint and menthol lozenges that reduce the lower esophageal sphincter tone and exacerbate the problem further in a cyclical fashion.   These are the same pts (now being labeled as having "irritable larynx syndrome" by some cough centers) who not infrequently have a history of having failed to tolerate ace inhibitors,  dry powder inhalers or biphosphonates or report having atypical/extraesophageal reflux symptoms that don't respond to standard doses of PPI  and are easily confused as having aecopd or asthma flares by even experienced allergists/ pulmonologists (myself included).    For now this is more of a nuisance than a threat to his health and he is clearly doing better on entresto so I did not rec any change in rx other than to use non-mint/non menthol candy to keep from excessive throat clearing.    I had an extended discussion with the patient reviewing all relevant studies completed to date and  lasting 25 minutes of a 40  minute office visit to [place] with pt new to me  Re non-specific but potentially very serious refractory respiratory symptoms of uncertain and potentially multiple  Etiologies.  Directly observed portions of ambulatory 02 saturation study which extended face to face time with this pt.   Each maintenance medication was reviewed in detail including most importantly the difference between maintenance and prns and under what circumstances the prns are to be triggered using an action plan format that is not reflected in  the computer generated alphabetically organized AVS.    Please see AVS for specific instructions unique to this office visit that I personally wrote and verbalized to the the pt in detail and then reviewed with pt  by my nurse highlighting any changes in therapy/plan of care  recommended at today's visit.

## 2019-04-16 ENCOUNTER — Encounter: Payer: Self-pay | Admitting: Family Medicine

## 2019-04-17 ENCOUNTER — Telehealth: Payer: Self-pay | Admitting: Student

## 2019-04-17 ENCOUNTER — Telehealth: Payer: Self-pay

## 2019-04-17 NOTE — Telephone Encounter (Signed)
Spoke with patient about enrolling in [facility] clinic. He would like to enroll; Will forward information to Sharman Cheek, RN to schedule    Legrand Como "Lee Hall, [place] 04/17/2019 3:23 PM

## 2019-04-17 NOTE — Telephone Encounter (Signed)
**Note De-Identified  Obfuscation** I have been messaging back and fourth with the pt through [facility] concerning the cost of his Entresto since yesterday.  Per the pts request I called him to discuss what [employer] has recommended that he do.  He has printed a [employer] pt asst application and is working on filling it out now. Once he finishes he states that he will e-mail it to me so I can take care of the provider part and have the DOD sign while Im in the office on Thursday (5/21) this week. Once this is done I will contact the pt and he states that he will come to the office to pick up his [employer] pt asst application and a bottle of Entresto samples as he is almost out.  The pt is aware to call me if he has any questions.

## 2019-04-19 NOTE — Telephone Encounter (Addendum)
**Note De-Identified  Obfuscation** Dr Acie Fredrickson has signed the pts [employer] pt asst application and I have faxed it to [employer].  The pt came to the office and waited until I faxed his application to [employer] so I could give him his original copies. The pt was given the copies and a bottle of Entresto samples.

## 2019-04-19 NOTE — Telephone Encounter (Addendum)
**Note De-Identified  Obfuscation** The pt emailed me his [employer] Pt asst application. I have completed the MD part and gave it to Dr Lanny Hurst (DOD) nurse awaiting his signature.

## 2019-04-24 ENCOUNTER — Ambulatory Visit (INDEPENDENT_AMBULATORY_CARE_PROVIDER_SITE_OTHER): Payer: Medicare HMO | Admitting: *Deleted

## 2019-04-24 ENCOUNTER — Ambulatory Visit: Payer: Medicare HMO | Admitting: Internal Medicine

## 2019-04-24 DIAGNOSIS — I5022 Chronic systolic (congestive) heart failure: Secondary | ICD-10-CM

## 2019-04-24 DIAGNOSIS — I255 Ischemic cardiomyopathy: Secondary | ICD-10-CM

## 2019-04-25 ENCOUNTER — Ambulatory Visit (INDEPENDENT_AMBULATORY_CARE_PROVIDER_SITE_OTHER): Payer: Medicare HMO

## 2019-04-25 ENCOUNTER — Telehealth: Payer: Self-pay

## 2019-04-25 DIAGNOSIS — I5022 Chronic systolic (congestive) heart failure: Secondary | ICD-10-CM

## 2019-04-25 DIAGNOSIS — Z95810 Presence of automatic (implantable) cardiac defibrillator: Secondary | ICD-10-CM | POA: Diagnosis not present

## 2019-04-25 LAB — CUP PACEART REMOTE DEVICE CHECK
Battery Remaining Longevity: 132 mo
Battery Remaining Percentage: 100 %
Brady Statistic RA Percent Paced: 5 %
Brady Statistic RV Percent Paced: 1 %
Date Time Interrogation Session: 20200526090100
HighPow Impedance: 51 Ohm
Implantable Lead Implant Date: 20050203
Implantable Lead Implant Date: 20190510
Implantable Lead Location: 753859
Implantable Lead Location: 753860
Implantable Lead Model: 5076
Implantable Lead Model: 675
Implantable Lead Serial Number: 500562
Implantable Pulse Generator Implant Date: 20190510
Lead Channel Impedance Value: 397 Ohm
Lead Channel Impedance Value: 560 Ohm
Lead Channel Pacing Threshold Amplitude: 0.7 V
Lead Channel Pacing Threshold Amplitude: 1.1 V
Lead Channel Pacing Threshold Pulse Width: 0.4 ms
Lead Channel Pacing Threshold Pulse Width: 0.4 ms
Lead Channel Setting Pacing Amplitude: 0.1 V
Lead Channel Setting Pacing Amplitude: 2 V
Lead Channel Setting Pacing Amplitude: 2.4 V
Lead Channel Setting Pacing Pulse Width: 0.1 ms
Lead Channel Setting Pacing Pulse Width: 0.4 ms
Lead Channel Setting Sensing Sensitivity: 0.6 mV
Lead Channel Setting Sensing Sensitivity: 1 mV
Pulse Gen Serial Number: 492955

## 2019-04-25 NOTE — Progress Notes (Signed)
EPIC Encounter for ICM Monitoring  Patient Name: Lee Hall is a 68 y.o. male Date: 04/25/2019 Primary Care Physican: [name], MD Primary Cardiologist: Lovena Le Electrophysiologist: Lovena Le 04/25/2019  Weight: unknown         1st ICM remote transmission.  Attempted call to patient and unable to reach.   Transmission reviewed and results sent via mychart since unable to reach patient by phone.   HeartLogic Heart Failure Index is 13 suggesting normal.  Prescribed: No diuretic  Recommendations:  Advised to limit salt intake to 2000 mg/day and fluid intake to < 2 liters/day via [employer].    Follow-up plan: ICM clinic phone appointment on 05/28/2019.  Office appointment scheduled 07/20/2019 with Dr. Lovena Le.        Copy of ICM check sent to Dr. Lovena Le.     [place], RN 04/25/2019 7:59 AM

## 2019-04-25 NOTE — Telephone Encounter (Signed)
Attempted ICM call to review 1st ICM remote transmission. Phone rang several times and phone was picked up but there was no response.

## 2019-04-25 NOTE — Telephone Encounter (Signed)
**Note De-Identified [name] Obfuscation** Letter received Lee Hall fax from [employer] stating that they have approved the pt or pt asst with his Entresto. Approval good until 11/29/2019. Pt ID: [id number]  The letter states that they have notified the pt of this approval.

## 2019-04-27 ENCOUNTER — Encounter: Payer: Self-pay | Admitting: Family Medicine

## 2019-04-30 ENCOUNTER — Ambulatory Visit: Payer: Medicare HMO | Admitting: Family Medicine

## 2019-04-30 ENCOUNTER — Ambulatory Visit: Payer: Self-pay | Admitting: Family Medicine

## 2019-04-30 ENCOUNTER — Other Ambulatory Visit: Payer: Self-pay

## 2019-04-30 ENCOUNTER — Encounter: Payer: Self-pay | Admitting: Family Medicine

## 2019-04-30 ENCOUNTER — Encounter: Payer: Medicare HMO | Admitting: Internal Medicine

## 2019-04-30 ENCOUNTER — Ambulatory Visit (INDEPENDENT_AMBULATORY_CARE_PROVIDER_SITE_OTHER): Payer: Medicare HMO | Admitting: Family Medicine

## 2019-04-30 ENCOUNTER — Telehealth: Payer: Self-pay | Admitting: Family Medicine

## 2019-04-30 ENCOUNTER — Other Ambulatory Visit: Payer: Self-pay | Admitting: Family Medicine

## 2019-04-30 DIAGNOSIS — B349 Viral infection, unspecified: Secondary | ICD-10-CM | POA: Diagnosis not present

## 2019-04-30 DIAGNOSIS — J00 Acute nasopharyngitis [common cold]: Secondary | ICD-10-CM

## 2019-04-30 MED ORDER — IPRATROPIUM BROMIDE 0.06 % NA SOLN: 2.0000 | Freq: Three times a day (TID) | NASAL | 0 refills | Status: DC

## 2019-04-30 NOTE — Progress Notes (Addendum)
Pt initially schedule for this am, but did not appear for appointment on doxy.  Pt's appt rescheduled for 1pm.  Pt resent invite for doxy via email, has not appeared in waiting room.  Pt called by this provider but did not answer.  Pt answered repeat call, states was waiting online, but got a provider unavailable message.  States did not receive 2nd doxy email request.   Phone call disconnected during visit.  Pt called back to complete call.  Virtual Visit via Telephone Note  I connected with Lee Hall on 04/30/19 at  1:00 PM EDT by telephone and verified that I am speaking with the correct person using two identifiers.   I discussed the limitations, risks, security and privacy concerns of performing an evaluation and management service by telephone and the availability of in person appointments. I also discussed with the patient that there may be a patient responsible charge related to this service. The patient expressed understanding and agreed to proceed.  Location patient: home Location provider: work or home office Participants present for the call: patient, provider Patient did not have a visit in the prior 7 days to address this/these issue(s).   History of Present Illness: Pt is a 68 yo male with pmh sig for ventricular tachycardia, MI, ischemic cardiomyopathy with ICD, CHF, CAD,  DM II, malignant melanoma metastatic to lymph node, anxiety. Pt seen for acute concern.  Typically seen by Dr. Ethlyn Gallery.  Pt having burning in sinuses and "upper back of throat".  Started yesterday morning with sensation in sinuses.  Pt concerned he has a sinus infection as his symptoms typically start this way.  Also with rhinorrhea.  Pt using ipratropium nasal spray TID, has not tried anything else for current symptoms.  Denies fever, sore throat, cough, HA, facial pain or pressure, ear pain or pressure.  Pt recounts health hx over the last few months including hospitalization in March for  pneumonia.  Pt states he is in "panic mode" given his heart history and does not want to be caught off guard.  Pt is demanding COVID-19 testing.  States was tested in March and had ab testing both of which were negative.  Pt has been staying at home other than going to the grocery store.   Observations/Objective: Patient dominating conversation, in NAD on the phone. I do not appreciate any SOB. Speech and thought processing are grossly intact. Patient reported vitals:  Assessment and Plan: Acute Rhinitis -Pt advised unlikely sinus infection given duration of symptoms. -Pt given info regarding area COVID-19 testing.   -advised to continue ipratropium nasal spray TID.   -Supportive care. discussed using saline nasal rinse and gargling with warm salt water or chloraseptic spray. -f/u with pcp  Follow Up Instructions: I did not refer this patient for an OV in the next 24 hours for this/these issue(s).  I discussed the assessment and treatment plan with the patient. The patient was provided an opportunity to ask questions and all were answered. The patient agreed with the plan and demonstrated an understanding of the instructions.   The patient was advised to call back or seek an in-person evaluation if the symptoms worsen or if the condition fails to improve as anticipated.  I provided 11 minutes of non-face-to-face time during this encounter.   Billie Ruddy, MD

## 2019-04-30 NOTE — Telephone Encounter (Signed)
Patient scheduled with Dr. Volanda Napoleon at 1 pm.

## 2019-04-30 NOTE — Progress Notes (Deleted)
Pt checked in to the doxy waiting room, but checked out prior to start of appointment.

## 2019-04-30 NOTE — Telephone Encounter (Signed)
Pt. Reports yesterday started having sinus burning and to day has a mild sore throat as well. Concerned about COVID 19. Warm transfer to [place] in the practice for virtual visit.  Answer Assessment - Initial Assessment Questions 1. COVID-19 DIAGNOSIS: "Who made your Coronavirus (COVID-19) diagnosis?" "Was it confirmed by a positive lab test?" If not diagnosed by a HCP, ask "Are there lots of cases (community spread) where you live?" (See public health department website, if unsure)   * MAJOR community spread: high number of cases; numbers of cases are increasing; many people hospitalized.   * MINOR community spread: low number of cases; not increasing; few or no people hospitalized     No 2. ONSET: "When did the COVID-19 symptoms start?"      Started yesterday 3. WORST SYMPTOM: "What is your worst symptom?" (e.g., cough, fever, shortness of breath, muscle aches)     Sinus burning 4. COUGH: "Do you have a cough?" If so, ask: "How bad is the cough?"       No 5. FEVER: "Do you have a fever?" If so, ask: "What is your temperature, how was it measured, and when did it start?"     No 6. RESPIRATORY STATUS: "Describe your breathing?" (e.g., shortness of breath, wheezing, unable to speak)      No 7. BETTER-SAME-WORSE: "Are you getting better, staying the same or getting worse compared to yesterday?"  If getting worse, ask, "In what way?"     Worse 8. HIGH RISK DISEASE: "Do you have any chronic medical problems?" (e.g., asthma, heart or lung disease, weak immune system, etc.)     Heart issues 9. PREGNANCY: "Is there any chance you are pregnant?" "When was your last menstrual period?"     n/a 10. OTHER SYMPTOMS: "Do you have any other symptoms?"  (e.g., runny nose, headache, sore throat, loss of smell)       Mild sore throat  Protocols used: CORONAVIRUS (COVID-19) DIAGNOSED OR SUSPECTED-A-AH

## 2019-04-30 NOTE — Telephone Encounter (Signed)
Patient called and says he was talking to [place] at the office and was disconnected about 15 minutes ago. I called the office and transferred the call to [place], [facility]

## 2019-05-02 ENCOUNTER — Other Ambulatory Visit: Payer: Self-pay | Admitting: Family Medicine

## 2019-05-02 ENCOUNTER — Encounter: Payer: Self-pay | Admitting: Oncology

## 2019-05-02 ENCOUNTER — Telehealth: Payer: Self-pay

## 2019-05-02 DIAGNOSIS — E1165 Type 2 diabetes mellitus with hyperglycemia: Secondary | ICD-10-CM

## 2019-05-02 DIAGNOSIS — E785 Hyperlipidemia, unspecified: Secondary | ICD-10-CM

## 2019-05-02 DIAGNOSIS — E1169 Type 2 diabetes mellitus with other specified complication: Secondary | ICD-10-CM

## 2019-05-02 DIAGNOSIS — E039 Hypothyroidism, unspecified: Secondary | ICD-10-CM

## 2019-05-02 NOTE — Telephone Encounter (Signed)
Spoke with patient regarding Mychart message and he understands that the lab appt on 6/4 prior to the CT scan is needed for the scan and that he still has a lab appointment on 7/2 prior to his appointment with Dr. Alen Blew.

## 2019-05-03 ENCOUNTER — Inpatient Hospital Stay: Payer: Medicare HMO | Attending: Oncology

## 2019-05-03 ENCOUNTER — Encounter (HOSPITAL_COMMUNITY): Payer: Self-pay

## 2019-05-03 ENCOUNTER — Other Ambulatory Visit: Payer: Self-pay

## 2019-05-03 ENCOUNTER — Ambulatory Visit (HOSPITAL_COMMUNITY)
Admission: RE | Admit: 2019-05-03 | Discharge: 2019-05-03 | Disposition: A | Discharge status: Home / Self Care | Payer: Medicare HMO | Source: Ambulatory Visit | Attending: Oncology | Admitting: Oncology

## 2019-05-03 DIAGNOSIS — Z8579 Personal history of other malignant neoplasms of lymphoid, hematopoietic and related tissues: Secondary | ICD-10-CM | POA: Diagnosis not present

## 2019-05-03 DIAGNOSIS — E1165 Type 2 diabetes mellitus with hyperglycemia: Secondary | ICD-10-CM | POA: Diagnosis not present

## 2019-05-03 DIAGNOSIS — C4361 Malignant melanoma of right upper limb, including shoulder: Secondary | ICD-10-CM | POA: Insufficient documentation

## 2019-05-03 DIAGNOSIS — I77811 Abdominal aortic ectasia: Secondary | ICD-10-CM | POA: Insufficient documentation

## 2019-05-03 DIAGNOSIS — C779 Secondary and unspecified malignant neoplasm of lymph node, unspecified: Secondary | ICD-10-CM | POA: Diagnosis not present

## 2019-05-03 DIAGNOSIS — C439 Malignant melanoma of skin, unspecified: Secondary | ICD-10-CM | POA: Insufficient documentation

## 2019-05-03 DIAGNOSIS — J439 Emphysema, unspecified: Secondary | ICD-10-CM | POA: Diagnosis not present

## 2019-05-03 DIAGNOSIS — I700 Atherosclerosis of aorta: Secondary | ICD-10-CM | POA: Insufficient documentation

## 2019-05-03 DIAGNOSIS — M899 Disorder of bone, unspecified: Secondary | ICD-10-CM | POA: Insufficient documentation

## 2019-05-03 DIAGNOSIS — R918 Other nonspecific abnormal finding of lung field: Secondary | ICD-10-CM | POA: Diagnosis not present

## 2019-05-03 DIAGNOSIS — Z85820 Personal history of malignant melanoma of skin: Secondary | ICD-10-CM | POA: Diagnosis not present

## 2019-05-03 LAB — CMP (CANCER CENTER ONLY)
ALT: 26 U/L (ref 0–44)
AST: 22 U/L (ref 15–41)
Albumin: 4.1 g/dL (ref 3.5–5.0)
Alkaline Phosphatase: 57 U/L (ref 38–126)
Anion gap: 10 (ref 5–15)
BUN: 24 mg/dL — ABNORMAL HIGH (ref 8–23)
CO2: 25 mmol/L (ref 22–32)
Calcium: 9.4 mg/dL (ref 8.9–10.3)
Chloride: 104 mmol/L (ref 98–111)
Creatinine: 1.39 mg/dL — ABNORMAL HIGH (ref 0.61–1.24)
GFR, Est AFR Am: >60 mL/min (ref >60)
GFR, Estimated: 53 mL/min — ABNORMAL LOW (ref >60)
Glucose, Bld: 95 mg/dL (ref 70–99)
Potassium: 4.3 mmol/L (ref 3.5–5.1)
Sodium: 139 mmol/L (ref 135–145)
Total Bilirubin: 0.4 mg/dL (ref 0.3–1.2)
Total Protein: 7.3 g/dL (ref 6.5–8.1)

## 2019-05-03 LAB — CBC WITH DIFFERENTIAL (CANCER CENTER ONLY)
Basophils Absolute: 0 10*3/uL (ref 0.0–0.1)
Basophils Relative: 0 %
Eosinophils Absolute: 0.2 10*3/uL (ref 0.0–0.5)
Eosinophils Relative: 3 %
HCT: 41.1 % (ref 39.0–52.0)
Hemoglobin: 13.2 g/dL (ref 13.0–17.0)
Lymphocytes Relative: 18 %
Lymphs Abs: 1.2 10*3/uL (ref 0.7–4.0)
MCH: 30.5 pg (ref 26.0–34.0)
MCHC: 32.1 g/dL (ref 30.0–36.0)
MCV: 94.9 fL (ref 80.0–100.0)
Monocytes Absolute: 0.8 10*3/uL (ref 0.1–1.0)
Monocytes Relative: 12 %
Neutro Abs: 4.4 10*3/uL (ref 1.7–7.7)
Neutrophils Relative %: 67 %
Platelet Count: 170 10*3/uL (ref 150–400)
RBC: 4.33 MIL/uL (ref 4.22–5.81)
RDW: 16.5 % — ABNORMAL HIGH (ref 11.5–15.5)
WBC Count: 6.7 10*3/uL (ref 4.0–10.5)
nRBC: 0 % (ref 0.0–0.2)

## 2019-05-03 MED ORDER — IOHEXOL 300 MG/ML  SOLN
100.0000 mL | Freq: Once | INTRAMUSCULAR | Status: AC | PRN
  Administered 2019-05-03: 100 mL via INTRAVENOUS

## 2019-05-03 MED ORDER — SODIUM CHLORIDE (PF) 0.9 % IJ SOLN
INTRAMUSCULAR | Status: AC
  Filled 2019-05-03: qty 50

## 2019-05-04 ENCOUNTER — Other Ambulatory Visit: Payer: Self-pay | Admitting: Family Medicine

## 2019-05-04 MED ORDER — EMPAGLIFLOZIN 10 MG PO TABS: 10.0000 mg | ORAL_TABLET | Freq: Every day | ORAL | 3 refills | Status: DC

## 2019-05-07 ENCOUNTER — Ambulatory Visit (INDEPENDENT_AMBULATORY_CARE_PROVIDER_SITE_OTHER): Payer: Medicare HMO

## 2019-05-07 DIAGNOSIS — I5022 Chronic systolic (congestive) heart failure: Secondary | ICD-10-CM

## 2019-05-07 DIAGNOSIS — Z95810 Presence of automatic (implantable) cardiac defibrillator: Secondary | ICD-10-CM

## 2019-05-07 NOTE — Progress Notes (Signed)
EPIC Encounter for ICM Monitoring  Patient Name: Lee Hall is a 68 y.o. male Date: 05/07/2019 Primary Care Physican: [name], MD Primary Cardiologist: Lovena Le Electrophysiologist: Lovena Le 04/25/2019  Weight: unknown                                                                                       Spoke with patient.  He is asymptomatic.  Discussed salt and fluid intake.  He is aware of salt content of foods but not counted the milligrams he is eating a day.  Encouraged him to review food labels and add salt amounts.  He is aware to limit fluid intake to 64 oz daily.     HeartLogic Heart Failure Index alerted at index of 16 which is an increase from 13 and suggesting fluid accumulation since 05/02/2019.  Prescribed:  No diuretic  Recommendations:  Advised to limit salt intake to 2000 mg/day and fluid intake to <2 liters/day.  Follow-up plan: ICM clinic phone appointment on 05/28/2019 and will monitor for alerts.  Office appointment scheduled 07/20/2019 with Dr. Lovena Le.        Copy of ICM check sent to Dr. Lovena Le.     [place], RN 05/07/2019 1:38 PM

## 2019-05-07 NOTE — Progress Notes (Signed)
Remote ICD transmission.   

## 2019-05-10 ENCOUNTER — Encounter: Payer: Self-pay | Admitting: Family Medicine

## 2019-05-10 DIAGNOSIS — I77811 Abdominal aortic ectasia: Secondary | ICD-10-CM

## 2019-05-10 NOTE — Telephone Encounter (Signed)
Pt requesting you review recent ct scans that are in Epic thanks

## 2019-05-11 DIAGNOSIS — I77811 Abdominal aortic ectasia: Secondary | ICD-10-CM | POA: Insufficient documentation

## 2019-05-11 NOTE — Telephone Encounter (Signed)
He will go over these with oncologist that ordered, so they will be able to speak in more detail from their side of things, but overall the scan reports sound stable, which is reassuring.   From my standpoint - the one thing to follow up on is that there is slight enlargement of aorta; so it is recommended to make sure this is stable by repeat ultrasound in 5 years. I will make note of this in his chart.   Otherwise as long as feeling well, just plan to review with oncology. If there are other concerns he has, let me know.

## 2019-05-14 ENCOUNTER — Telehealth: Payer: Self-pay | Admitting: *Deleted

## 2019-05-14 NOTE — Telephone Encounter (Signed)
I called the pt and informed him the patient assistance forms were completed to [employer] at [phone].

## 2019-05-14 NOTE — Telephone Encounter (Signed)
Copied from [place] [id number]. Topic: General - Other >> May 14, 2019 10:43 AM Reyne Dumas L wrote: Reason for CRM:   Pt requesting to speak with Arville Go to discuss some medication issues he is having trying to get his Jardiance. Pt can be reached at [phone].  Please call after 11:30am.

## 2019-05-15 ENCOUNTER — Encounter: Payer: Self-pay | Admitting: Family Medicine

## 2019-05-25 ENCOUNTER — Encounter: Payer: Self-pay | Admitting: Family Medicine

## 2019-05-28 ENCOUNTER — Encounter: Payer: Self-pay | Admitting: Family Medicine

## 2019-05-28 ENCOUNTER — Ambulatory Visit (INDEPENDENT_AMBULATORY_CARE_PROVIDER_SITE_OTHER): Payer: Medicare HMO

## 2019-05-28 DIAGNOSIS — Z95810 Presence of automatic (implantable) cardiac defibrillator: Secondary | ICD-10-CM

## 2019-05-28 DIAGNOSIS — I5022 Chronic systolic (congestive) heart failure: Secondary | ICD-10-CM

## 2019-05-30 ENCOUNTER — Telehealth: Payer: Self-pay

## 2019-05-30 NOTE — Progress Notes (Signed)
EPIC Encounter for ICM Monitoring  Patient Name: Lee Hall is a 68 y.o. male Date: 05/30/2019 Primary Care Physican: [name], MD Primary Cardiologist:Taylor Electrophysiologist:Taylor 7/1/2020Weight: 169 lbs   Spoke with patient.  He is asymptomatic.  Discussed limiting salt intake and he reports he reads foods labels and does not think he gets a lot of salt.  He is aware to limit fluid intake to 64 oz daily.    HeartLogic Heart Failure Index is 13 and suggesting fluid accumulation since 05/02/2019.  Prescribed: No diuretic  Recommendations:Advised to limit salt intake to 2000 mg/day and fluid intake to <2 liters/day.  Phone note sent to Dr Forde Dandy nurse because patient is concerned the thyroid may not be normal because he developed ridges in all 10 fingernails in the last week.    Follow-up plan: ICM clinic phone appointment on8/01/2019 and will monitor for alerts. Office appointment scheduled 07/20/2019 with Dr.Taylor.     [place], RN 05/30/2019 1:05 PM

## 2019-05-30 NOTE — Telephone Encounter (Signed)
ICM call to patient.  He is very concerned that his thyroid levels may be abnormal because last week he developed nail ridges on all 10 nails.  He knows the ridges were not present the week before.  He googled and found this could be a sign his thyroid levels are not normal.  He reports he had thyroid issues several years ago when he was taking Amiodarone and now he is taking Amiodarone again.  Advised I would route this to Dr Tanna Furry nurse for follow up regarding the symptom he describes.

## 2019-05-31 ENCOUNTER — Inpatient Hospital Stay: Payer: Medicare HMO | Attending: Oncology

## 2019-05-31 ENCOUNTER — Inpatient Hospital Stay (HOSPITAL_BASED_OUTPATIENT_CLINIC_OR_DEPARTMENT_OTHER): Payer: Medicare HMO | Admitting: Oncology

## 2019-05-31 ENCOUNTER — Telehealth: Payer: Self-pay | Admitting: Oncology

## 2019-05-31 ENCOUNTER — Other Ambulatory Visit: Payer: Self-pay

## 2019-05-31 VITALS — BP 113/65 | HR 73 | Temp 98.2°F | Resp 17 | Ht 70.0 in | Wt 171.5 lb

## 2019-05-31 DIAGNOSIS — Z79899 Other long term (current) drug therapy: Secondary | ICD-10-CM | POA: Diagnosis not present

## 2019-05-31 DIAGNOSIS — K449 Diaphragmatic hernia without obstruction or gangrene: Secondary | ICD-10-CM

## 2019-05-31 DIAGNOSIS — C4361 Malignant melanoma of right upper limb, including shoulder: Secondary | ICD-10-CM

## 2019-05-31 DIAGNOSIS — C439 Malignant melanoma of skin, unspecified: Secondary | ICD-10-CM

## 2019-05-31 DIAGNOSIS — Z7984 Long term (current) use of oral hypoglycemic drugs: Secondary | ICD-10-CM

## 2019-05-31 DIAGNOSIS — I700 Atherosclerosis of aorta: Secondary | ICD-10-CM

## 2019-05-31 DIAGNOSIS — M899 Disorder of bone, unspecified: Secondary | ICD-10-CM | POA: Insufficient documentation

## 2019-05-31 DIAGNOSIS — J439 Emphysema, unspecified: Secondary | ICD-10-CM | POA: Insufficient documentation

## 2019-05-31 DIAGNOSIS — C773 Secondary and unspecified malignant neoplasm of axilla and upper limb lymph nodes: Secondary | ICD-10-CM | POA: Diagnosis not present

## 2019-05-31 DIAGNOSIS — Z7982 Long term (current) use of aspirin: Secondary | ICD-10-CM

## 2019-05-31 DIAGNOSIS — I517 Cardiomegaly: Secondary | ICD-10-CM

## 2019-05-31 LAB — CMP (CANCER CENTER ONLY)
ALT: 50 U/L — ABNORMAL HIGH (ref 0–44)
AST: 35 U/L (ref 15–41)
Albumin: 4.5 g/dL (ref 3.5–5.0)
Alkaline Phosphatase: 66 U/L (ref 38–126)
Anion gap: 10 (ref 5–15)
BUN: 26 mg/dL — ABNORMAL HIGH (ref 8–23)
CO2: 25 mmol/L (ref 22–32)
Calcium: 9.8 mg/dL (ref 8.9–10.3)
Chloride: 105 mmol/L (ref 98–111)
Creatinine: 1.61 mg/dL — ABNORMAL HIGH (ref 0.61–1.24)
GFR, Est AFR Am: 51 mL/min — ABNORMAL LOW (ref >60)
GFR, Estimated: 44 mL/min — ABNORMAL LOW (ref >60)
Glucose, Bld: 126 mg/dL — ABNORMAL HIGH (ref 70–99)
Potassium: 4.6 mmol/L (ref 3.5–5.1)
Sodium: 140 mmol/L (ref 135–145)
Total Bilirubin: 0.5 mg/dL (ref 0.3–1.2)
Total Protein: 7.8 g/dL (ref 6.5–8.1)

## 2019-05-31 LAB — CBC WITH DIFFERENTIAL (CANCER CENTER ONLY)
Abs Immature Granulocytes: 0.01 10*3/uL (ref 0.00–0.07)
Basophils Absolute: 0 10*3/uL (ref 0.0–0.1)
Basophils Relative: 1 %
Eosinophils Absolute: 0.1 10*3/uL (ref 0.0–0.5)
Eosinophils Relative: 2 %
HCT: 44.5 % (ref 39.0–52.0)
Hemoglobin: 14.6 g/dL (ref 13.0–17.0)
Immature Granulocytes: 0 %
Lymphocytes Relative: 15 %
Lymphs Abs: 0.9 10*3/uL (ref 0.7–4.0)
MCH: 31.3 pg (ref 26.0–34.0)
MCHC: 32.8 g/dL (ref 30.0–36.0)
MCV: 95.5 fL (ref 80.0–100.0)
Monocytes Absolute: 0.7 10*3/uL (ref 0.1–1.0)
Monocytes Relative: 12 %
Neutro Abs: 4.1 10*3/uL (ref 1.7–7.7)
Neutrophils Relative %: 70 %
Platelet Count: 173 10*3/uL (ref 150–400)
RBC: 4.66 MIL/uL (ref 4.22–5.81)
RDW: 13.4 % (ref 11.5–15.5)
WBC Count: 5.8 10*3/uL (ref 4.0–10.5)
nRBC: 0 % (ref 0.0–0.2)

## 2019-05-31 NOTE — Telephone Encounter (Signed)
Gave avs and calendar ° °

## 2019-05-31 NOTE — Progress Notes (Signed)
Hematology and Oncology Follow Up Visit  Lee Hall [id number] September 11, 1951 68 y.o. 05/31/2019 9:49 AM Lee Hall, MDKoberlein, Lee Berg, MD   Principle Diagnosis: 68 year old man with T3N3 melanoma of the right shoulder diagnosed in January 2019.  His tumor was BRAF positive with 4 out of 28 lymph nodes involved in the right axilla after lymph node dissection.   Prior Therapy:  He is status post resection in December 09, 2017 completed in [place].  He had positive margins without any lymph node sampling.  He is status post right axillary lymph node dissection completed on 10/05/2018 by Dr. Barry Dienes.  Final pathology revealed 4 out of 28 lymph node involvement.  Nivolumab 480 mg monthly started in January 2020 for adjuvant purposes.  He is status post 2 months of therapy and therapy discontinued because of acute illness and hospitalization.   Current therapy: Active surveillance.  Interim History: [name] returns today for a follow-up.  Since the last visit, he continues to do well and recovered from his recent hospitalization and infection.  He has regained all activities of daily living as well as improved respiratory status.  He denied any cough, shortness of breath or dyspnea on exertion.  He denied any abdominal pain or diarrhea.  He denied any alteration mental status, neuropathy, confusion or dizziness.  Denies any headaches or lethargy.  Denies any night sweats, weight loss or changes in appetite.  Denied orthopnea, dyspnea on exertion or chest discomfort.  Denies shortness of breath, difficulty breathing hemoptysis or cough.  Denies any abdominal distention, nausea, early satiety or dyspepsia.  Denies any hematuria, frequency, dysuria or nocturia.  Denies any skin irritation, dryness or rash.  Denies any ecchymosis or petechiae.  Denies any lymphadenopathy or clotting.  Denies any heat or cold intolerance.  Denies any anxiety or depression.  Remaining review of  system is negative.           Medications: I have reviewed the patient's current medications.  No changes by my review. Current Outpatient Medications  Medication Sig Dispense Refill  . acyclovir (ZOVIRAX) 800 MG tablet Take 400 mg by mouth once a week.     Marland Kitchen amiodarone (PACERONE) 200 MG tablet Take 1 tablet (200 mg total) by mouth 2 (two) times daily for 30 days. 60 tablet 0  . aspirin 81 MG tablet Take 81 mg by mouth daily.      . blood glucose meter kit and supplies Dispense based on patient and insurance preference. Use up to four times daily as directed. (FOR ICD-10 E10.9, E11.9). 1 each 0  . carvedilol (COREG) 6.25 MG tablet Take 0.5 tablets (3.125 mg total) by mouth 2 (two) times daily with a meal. On 02/19/2019 increase to 1 tab (6.25 mg total) twice daily. 60 tablet 0  . empagliflozin (JARDIANCE) 10 MG TABS tablet Take 10 mg by mouth daily. 90 tablet 3  . ipratropium (ATROVENT) 0.06 % nasal spray Place 2 sprays into both nostrils 3 (three) times daily. 60 mL 0  . metFORMIN (GLUCOPHAGE-XR) 500 MG 24 hr tablet Take 500 mg by mouth 2 (two) times daily.     Marland Kitchen mexiletine (MEXITIL) 150 MG capsule Take 150 mg by mouth 3 (three) times daily.    . sacubitril-valsartan (ENTRESTO) 24-26 MG Take 1 tablet by mouth 2 (two) times daily. 60 tablet 11   No current facility-administered medications for this visit.      Allergies: No Known Allergies  Past Medical History, Surgical history, Social history,  and Family History discussed without any changes today.  Marland Kitchen  Physical Exam:  Blood pressure 113/65, pulse 73, temperature 98.2 F (36.8 C), temperature source Oral, resp. rate 17, height _0  (1.778 m), weight 171 lb 8 oz (77.8 kg), SpO2 100 %.    ECOG: 1     General appearance: Alert, awake without any distress. Head: Atraumatic without abnormalities Oropharynx: Without any thrush or ulcers. Eyes: No scleral icterus. Lymph nodes: No lymphadenopathy noted in the cervical,  supraclavicular, or axillary nodes Heart:regular rate and rhythm, without any murmurs or gallops.   Lung: Clear to auscultation without any rhonchi, wheezes or dullness to percussion. Abdomin: Soft, nontender without any shifting dullness or ascites. Musculoskeletal: No clubbing or cyanosis. Neurological: No motor or sensory deficits. Skin: Well-healed scar on his shoulder on the right side.       Lab Results: Lab Results  Component Value Date   WBC 6.7 05/03/2019   HGB 13.2 05/03/2019   HCT 41.1 05/03/2019   MCV 94.9 05/03/2019   PLT 170 05/03/2019     Chemistry      Component Value Date/Time   NA 139 05/03/2019 1301   NA 137 03/05/2019   K 4.3 05/03/2019 1301   CL 104 05/03/2019 1301   CO2 25 05/03/2019 1301   BUN 24 (H) 05/03/2019 1301   BUN 41 (A) 03/05/2019   CREATININE 1.39 (H) 05/03/2019 1301   CREATININE 1.07 01/19/2019 1638   GLU 192 03/05/2019      Component Value Date/Time   CALCIUM 9.4 05/03/2019 1301   ALKPHOS 57 05/03/2019 1301   AST 22 05/03/2019 1301   ALT 26 05/03/2019 1301   BILITOT 0.4 05/03/2019 1301     EXAM: CT CHEST, ABDOMEN, AND PELVIS WITH CONTRAST  TECHNIQUE: Multidetector CT imaging of the chest, abdomen and pelvis was performed following the standard protocol during bolus administration of intravenous contrast.  CONTRAST:  182m OMNIPAQUE IOHEXOL 300 MG/ML  SOLN  COMPARISON:  09/04/2018 PET-CT.  FINDINGS: CT CHEST FINDINGS  Cardiovascular: Mild cardiomegaly. No significant pericardial effusion/thickening. Two lead left subclavian ICD is noted with lead tips in right atrium and right ventricular apex. Atherosclerotic nonaneurysmal thoracic aorta. Dilated main pulmonary artery (3.7 cm diameter). No central pulmonary emboli.  Mediastinum/Nodes: No discrete thyroid nodules. Unremarkable esophagus. Interval right axillary lymph node dissection with multiple right axillary surgical clips and soft tissue stranding in the  right axilla. No pathologically enlarged axillary lymph nodes. No mediastinal or hilar adenopathy.  Lungs/Pleura: No pneumothorax. No pleural effusion. Mild centrilobular and paraseptal emphysema. No acute consolidative airspace disease or lung masses. Solid right middle lobe 4 mm pulmonary nodule (series 7/image 112) is stable since 09/04/2018. Subsolid 8 mm lingular nodule (series 7/image 90) is stable since 09/04/2018. No new significant pulmonary nodules. There is patchy mild peripheral reticulation and ground-glass attenuation in both lungs, for which comparison to CT images on the prior PET-CT is limited due to motion degradation on the prior study.  Musculoskeletal: No aggressive appearing focal osseous lesions. Healed deformity in the posterior left eleventh rib. Moderate thoracic spondylosis.  CT ABDOMEN PELVIS FINDINGS  Hepatobiliary: Normal liver with no liver mass. Normal gallbladder with no radiopaque cholelithiasis. No biliary ductal dilatation.  Pancreas: Normal, with no mass or duct dilation.  Spleen: Normal size. No mass.  Adrenals/Urinary Tract: Normal adrenals. No hydronephrosis. Subcentimeter hypodense left renal cortical lesions are too small to characterize and require no follow-up. No additional renal lesions. Normal bladder.  Stomach/Bowel: Small hiatal hernia.  Otherwise normal nondistended stomach. Normal caliber small bowel with no small bowel wall thickening. Normal appendix. Oral contrast transits to the colon. Moderate left colonic diverticulosis, with no large bowel wall thickening or significant pericolonic fat stranding.  Vascular/Lymphatic: Atherosclerotic abdominal aorta with ectatic 2.6 cm infrarenal abdominal aorta, stable. Patent portal, splenic and renal veins. No pathologically enlarged lymph nodes in the abdomen or pelvis.  Reproductive: Normal size prostate.  Other: No pneumoperitoneum, ascites or focal fluid  collection.  Musculoskeletal: Sclerotic 1.5 cm left L1 vertebral lesion, previously occult on the CT images from 09/04/2018 PET-CT, correlating with the site of L1 vertebral uptake on the PET images. No new focal osseous lesions. Lumbar spondylosis.  IMPRESSION: 1. Sclerotic left L1 vertebral lesion, correlating with the site of L1 vertebral uptake on the 09/04/2018 PET-CT, previously CT occult, favored to represent treatment response within a solitary osseous metastasis. 2. Otherwise no findings suspicious for metastatic disease in the chest, abdomen or pelvis. Interval right axillary lymph node dissection, with no evidence of recurrent adenopathy. 3. Two subcentimeter pulmonary nodules are stable since 09/04/2018 and probably benign. 4. Ectatic 2.6 cm infrarenal abdominal aorta, at risk for aneurysm development. Recommend follow-up aortic ultrasound in 5 years. This recommendation follows ACR consensus guidelines: White Paper of the ACR Incidental Findings Committee II on Vascular Findings. J Am Coll Radiol 2013; 10:789-794. 5. Aortic Atherosclerosis (ICD10-I70.0) and Emphysema (ICD10-J43.9). Numerous additional chronic findings as detailed.     Impression and Plan:  68 year old man with:  1.  T3N3 ulcerated melanoma of the right shoulder diagnosed in January 2019.  His tumor is BRAF positive without any evidence of metastatic disease.  He is currently on active surveillance after poor tolerance to immunotherapy and potential immune mediated pneumonitis.  He has recovered well at this time and CT scan on 05/03/2019 was reviewed today.  Images were personally reviewed and discussed with the patient and showed no evidence of metastatic disease at this time.  Risks and benefits of continuing active surveillance versus potential adjuvant BRAF targeted therapy was discussed.  He opted to continue with active surveillance at this time.  He understands that systemic therapy may be  needed if he develops metastatic disease.     2.    Dermatology surveillance: I recommended continued dermatology surveillance regarding this cancer.  3.  Immune mediated complications: Resolved at this time although it is unclear whether he had developed immune mediated pneumonitis with his respiratory failure.  4.  CNS surveillance: CT scan of the brain on 05/03/2019 did not show any evidence of metastatic disease.  He cannot receive MRI.  5.  Follow-up: We will be in 6 months for repeat evaluation and repeat imaging studies for staging purposes.  15  minutes was spent with the patient face-to-face today.  More than 50% of time was spent on reviewing imaging studies, treatment options and answering questions regarding future plan of care.   Zola Button, MD 7/2/20209:49 AM

## 2019-06-04 ENCOUNTER — Other Ambulatory Visit: Payer: Self-pay

## 2019-06-04 DIAGNOSIS — Z9229 Personal history of other drug therapy: Secondary | ICD-10-CM

## 2019-06-04 DIAGNOSIS — I472 Ventricular tachycardia, unspecified: Secondary | ICD-10-CM

## 2019-06-04 NOTE — Progress Notes (Signed)
TSH and T4 free per Dr. Lovena Le

## 2019-06-04 NOTE — Telephone Encounter (Signed)
There are some baseline labs I have already ordered (back last month) that I would like for him to complete in 2 weeks time. Nonfasting is fine and I would like him to make sure that he has had water intake prior to labs to see how kidneys look. They were slightly strained on recent bloodwork but he also looked a little dehydrated which will make them look more strained - this is a change from the normal function a month ago.   In terms of nails -I think that abnormality is more related to severe illness he had earlier this year and it is perfect timeline to see the nail abnormality in response to this. Liver function looks good. Kidney function has been stable since discharge home, so I don't think it is related to these issues.   Please set up labwork for 2 weeks time.

## 2019-06-05 ENCOUNTER — Telehealth: Payer: Self-pay | Admitting: *Deleted

## 2019-06-05 ENCOUNTER — Other Ambulatory Visit (INDEPENDENT_AMBULATORY_CARE_PROVIDER_SITE_OTHER): Payer: Medicare HMO

## 2019-06-05 ENCOUNTER — Other Ambulatory Visit: Payer: Medicare HMO

## 2019-06-05 ENCOUNTER — Other Ambulatory Visit: Payer: Self-pay

## 2019-06-05 DIAGNOSIS — Z9229 Personal history of other drug therapy: Secondary | ICD-10-CM

## 2019-06-05 DIAGNOSIS — E039 Hypothyroidism, unspecified: Secondary | ICD-10-CM

## 2019-06-05 DIAGNOSIS — E1165 Type 2 diabetes mellitus with hyperglycemia: Secondary | ICD-10-CM

## 2019-06-05 DIAGNOSIS — E785 Hyperlipidemia, unspecified: Secondary | ICD-10-CM

## 2019-06-05 DIAGNOSIS — E1169 Type 2 diabetes mellitus with other specified complication: Secondary | ICD-10-CM

## 2019-06-05 DIAGNOSIS — I472 Ventricular tachycardia, unspecified: Secondary | ICD-10-CM

## 2019-06-05 LAB — LIPID PANEL
Cholesterol: 109 mg/dL (ref 0–200)
HDL: 35.6 mg/dL — ABNORMAL LOW (ref >39.00)
LDL Cholesterol: 43 mg/dL (ref 0–99)
NonHDL: 73.48
Total CHOL/HDL Ratio: 3
Triglycerides: 154 mg/dL — ABNORMAL HIGH (ref 0.0–149.0)
VLDL: 30.8 mg/dL (ref 0.0–40.0)

## 2019-06-05 LAB — COMPREHENSIVE METABOLIC PANEL
ALT: 42 U/L (ref 0–53)
AST: 30 U/L (ref 0–37)
Albumin: 4.4 g/dL (ref 3.5–5.2)
Alkaline Phosphatase: 57 U/L (ref 39–117)
BUN: 21 mg/dL (ref 6–23)
CO2: 27 mEq/L (ref 19–32)
Calcium: 9.2 mg/dL (ref 8.4–10.5)
Chloride: 104 mEq/L (ref 96–112)
Creatinine, Ser: 1.34 mg/dL (ref 0.40–1.50)
GFR: 53.35 mL/min — ABNORMAL LOW (ref >60.00)
Glucose, Bld: 105 mg/dL — ABNORMAL HIGH (ref 70–99)
Potassium: 4.6 mEq/L (ref 3.5–5.1)
Sodium: 139 mEq/L (ref 135–145)
Total Bilirubin: 0.4 mg/dL (ref 0.2–1.2)
Total Protein: 6.7 g/dL (ref 6.0–8.3)

## 2019-06-05 LAB — MICROALBUMIN / CREATININE URINE RATIO
Creatinine,U: 70.5 mg/dL
Microalb Creat Ratio: 3.6 mg/g (ref 0.0–30.0)
Microalb, Ur: 2.5 mg/dL — ABNORMAL HIGH (ref 0.0–1.9)

## 2019-06-05 LAB — HEMOGLOBIN A1C: Hgb A1c MFr Bld: 6.7 % — ABNORMAL HIGH (ref 4.6–6.5)

## 2019-06-05 LAB — TSH: TSH: 3.28 u[IU]/mL (ref 0.35–4.50)

## 2019-06-05 LAB — T4, FREE: Free T4: 0.79 ng/dL (ref 0.60–1.60)

## 2019-06-05 NOTE — Telephone Encounter (Signed)
Pt came in for a lab appointment requested t4 free be done per cardiologist labs were visible in epic and done per pts request

## 2019-06-13 ENCOUNTER — Encounter: Payer: Self-pay | Admitting: Family Medicine

## 2019-06-15 NOTE — Telephone Encounter (Signed)
I called the pt, informed him of the message below and reviewed results from 7/8.

## 2019-06-15 NOTE — Telephone Encounter (Signed)
Please review lab results report with patient since he cannot find this in mychart.   I believe nail abnormality is related to him being very sick in hospital. He might even get some lifting of the nail as it grows out, but the bottom health is growing in healthy and I believe all will look normal in a few months as nails continue to grow out.

## 2019-06-19 DIAGNOSIS — I472 Ventricular tachycardia, unspecified: Secondary | ICD-10-CM

## 2019-06-19 MED ORDER — ENTRESTO 24-26 MG PO TABS: 1.0000 | ORAL_TABLET | Freq: Two times a day (BID) | ORAL | 3 refills | Status: DC

## 2019-06-19 MED ORDER — ROSUVASTATIN CALCIUM 10 MG PO TABS: 10.0000 mg | ORAL_TABLET | Freq: Every day | ORAL | 3 refills | Status: DC

## 2019-06-19 MED ORDER — MEXILETINE HCL 150 MG PO CAPS: 150.0000 mg | ORAL_CAPSULE | Freq: Three times a day (TID) | ORAL | 3 refills | Status: DC

## 2019-06-19 MED ORDER — AMIODARONE HCL 200 MG PO TABS: 200.0000 mg | ORAL_TABLET | Freq: Two times a day (BID) | ORAL | 3 refills | Status: DC

## 2019-06-19 MED ORDER — CARVEDILOL 6.25 MG PO TABS: 6.2500 mg | ORAL_TABLET | Freq: Two times a day (BID) | ORAL | 3 refills | Status: DC

## 2019-06-23 ENCOUNTER — Other Ambulatory Visit: Payer: Self-pay | Admitting: Family Medicine

## 2019-06-29 ENCOUNTER — Encounter: Payer: Self-pay | Admitting: Family Medicine

## 2019-07-02 ENCOUNTER — Ambulatory Visit (INDEPENDENT_AMBULATORY_CARE_PROVIDER_SITE_OTHER): Payer: Medicare HMO

## 2019-07-02 DIAGNOSIS — I5022 Chronic systolic (congestive) heart failure: Secondary | ICD-10-CM | POA: Diagnosis not present

## 2019-07-02 DIAGNOSIS — Z95810 Presence of automatic (implantable) cardiac defibrillator: Secondary | ICD-10-CM | POA: Diagnosis not present

## 2019-07-03 ENCOUNTER — Telehealth: Payer: Self-pay

## 2019-07-03 ENCOUNTER — Telehealth: Payer: Self-pay | Admitting: Cardiology

## 2019-07-03 NOTE — Telephone Encounter (Signed)
Spoke with patient to remind of missed remote transmission 

## 2019-07-03 NOTE — Telephone Encounter (Signed)
Pt called back and stated that he attempted to send remote transmission but the one yellow light on the sending side of the monitor is blinking. After 3 unsuccessful attempts I instructed pt to call tech support. Pt verbalized understanding .

## 2019-07-04 ENCOUNTER — Telehealth: Payer: Self-pay

## 2019-07-04 ENCOUNTER — Encounter: Payer: Medicare HMO | Admitting: Internal Medicine

## 2019-07-04 NOTE — Telephone Encounter (Signed)
ICM call to patient.  During ICM call, he reports Mexiletine is not covered by [employer] [facility] does not cover the drug it and [facility] charges $300 for 90 day supply which is too expensive for him.  He asked if there is other alternatives to get the drug assistance.  Advised I would send the message to Dr Forde Dandy nurse, Sonia Baller and she will call him back.  He said that would be fine and is appreciative of the help.

## 2019-07-04 NOTE — Progress Notes (Signed)
EPIC Encounter for ICM Monitoring  Patient Name: Lee Hall is a 68 y.o. male Date: 07/04/2019 Primary Care Physican: [name], MD Primary Cardiologist:Taylor Electrophysiologist:Taylor Weight: unknown   Spoke with patient.  He is asymptomatic.  He works part time job and asked about the appt time for remote transmission.  Explained the scheduled reports are sent between 12 midnight and 6 AM and it is not necessary for him to be at the monitor during the scheduled daytime appt.      HeartLogic Heart Failure Index 0.  Returned to < threshold on 06/21/2019.   Prescribed: No diuretic  Recommendations:Advised to limit salt intake to 2000 mg/day and call if he experiences any fluid symptoms.  Follow-up plan: ICM clinic phone appointment on10/04/2019. Office appointment scheduled 07/20/2019 with Dr.Taylor.  Copy of ICM check sent to Dr. Lovena Le.     [place], RN 07/04/2019 8:42 AM

## 2019-07-05 ENCOUNTER — Other Ambulatory Visit: Payer: Self-pay | Admitting: Family Medicine

## 2019-07-09 NOTE — Telephone Encounter (Signed)
**Note De-Identified  Obfuscation** I have started a Mexile[employer]ine tier exception through covermymeds in hopes of getting the cost down. Key: [id number]

## 2019-07-11 NOTE — Telephone Encounter (Signed)
We received a letter from [facility] stating that they have approved coverage of the pts Mexiletine. Approval good until 11/29/2019

## 2019-07-12 DIAGNOSIS — H04123 Dry eye syndrome of bilateral lacrimal glands: Secondary | ICD-10-CM | POA: Diagnosis not present

## 2019-07-12 DIAGNOSIS — H35013 Changes in retinal vascular appearance, bilateral: Secondary | ICD-10-CM | POA: Diagnosis not present

## 2019-07-12 DIAGNOSIS — H35373 Puckering of macula, bilateral: Secondary | ICD-10-CM | POA: Diagnosis not present

## 2019-07-12 DIAGNOSIS — H40013 Open angle with borderline findings, low risk, bilateral: Secondary | ICD-10-CM | POA: Diagnosis not present

## 2019-07-13 ENCOUNTER — Ambulatory Visit: Payer: Medicare HMO | Admitting: Internal Medicine

## 2019-07-16 ENCOUNTER — Other Ambulatory Visit: Payer: Self-pay

## 2019-07-16 ENCOUNTER — Ambulatory Visit (INDEPENDENT_AMBULATORY_CARE_PROVIDER_SITE_OTHER): Payer: Medicare HMO | Admitting: Internal Medicine

## 2019-07-16 ENCOUNTER — Encounter: Payer: Self-pay | Admitting: Internal Medicine

## 2019-07-16 DIAGNOSIS — R0609 Other forms of dyspnea: Secondary | ICD-10-CM

## 2019-07-16 DIAGNOSIS — R05 Cough: Secondary | ICD-10-CM

## 2019-07-16 DIAGNOSIS — R058 Other specified cough: Secondary | ICD-10-CM

## 2019-07-16 NOTE — Progress Notes (Signed)
'@Patient'  ID: Lee Hall, male    DOB: 05-05-1951  MRN: [id number]    Referring provider: [name], MD  HPI:   74 yowm quit smoking 2005  When dx of cardiomyopathy consulted with our team on 02/01/2019 while inpatient with a right lower lobe pneumonia, ischemic cardiomyopathy, chronic systolic heart failure, and septic shock  PMH: CAD, ischemic cardiomyopathy with chronic systolic CHF/LVEF 34% (entresto), VT status post ICD/PPM on amiodarone, melanoma on immunotherapy (nivolaub) presented with dyspnea, leg swelling and hypotension. Smoker/ Smoking History: Former Smoker  Quit 2005.  20-pack-year smoking history. Maintenance: None Pt of: Dr. Melvyn Novas assessment:   02/22/2019  - post hosp f/u ov / NP   Patient was consulted with our practice on 02/01/2019 when he was inpatient for a right lower lobe pneumonia, brief cardiac arrest on 02/01/2019, and septic shock.  This required the patient to be intubated 2 times.  Patient was discharged on 02/14/2019.  Patient reports that since being discharged he has been staying with his sister and brother-in-law at their house.  He reports that his breathing has improved on 100%.    rec No change rx     04/12/2019  Initial pulmonary attending ov/Antigone Crowell re: chf / on amiodarone 200 bid  Chief Complaint  Patient presents with  . Follow-up    Breathing has improved back near his normal baseline. No new co's.   Dyspnea:  75% back to baseline ex tol : does 1/3 mile loop in neighborhood 4 x daily at his avg pace but slows down on hills Cough: hoarse/ non productive but sense of assoc pnds some better on atrovent chronically,flared on acei previously and note now on entresto  Sleeping: lie flat ok one pillow  rec No change in medications Keep walking daily and let us know if you are losing ground due to breathing Please schedule a follow up visit in 3 months but call sooner if needed with pfts      07/16/2019  f/u ov/Edwing Figley re:  Amiodarone use   Chief Complaint  Patient presents with  . Follow-up    Patient reports that his breathing is the same and is his normal, no better, no worse. He states that he does not have a strain to breath with exertion.  Dyspnea:  Walking a mile a day across parking lots at car delearship  Cough: not really coughing - "just hoarse" attributed to pnds with neg allergy eval in [place] though note on entresto Sleeping: drainage problematic at hs if forgets nasal spray  SABA use: none  02: none    No obvious day to day or daytime variability or assoc excess/ purulent sputum or mucus plugs or hemoptysis or cp or chest tightness, subjective wheeze or overt sinus or hb symptoms.   Sleeping most nights  without nocturnal  or early am exacerbation  of respiratory  c/o's or need for noct saba. Also denies any obvious fluctuation of symptoms with weather or environmental changes or other aggravating or alleviating factors except as outlined above   No unusual exposure hx or h/o childhood pna/ asthma or knowledge of premature birth.  Current Allergies, Complete Past Medical History, Past Surgical History, Family History, and Social History were reviewed in [employer] record.  ROS  The following are not active complaints unless bolded Hoarseness, sore throat, dysphagia, dental problems, itching, sneezing,  nasal congestion or discharge of excess watery mucus or purulent secretions, ear ache,   fever, chills, sweats, unintended wt  loss or wt gain, classically pleuritic or exertional cp,  orthopnea pnd or arm/hand swelling  or leg swelling, presyncope, palpitations, abdominal pain, anorexia, nausea, vomiting, diarrhea  or change in bowel habits or change in bladder habits, change in stools or change in urine, dysuria, hematuria,  rash, arthralgias, visual complaints, headache, numbness, weakness or ataxia or problems with walking or coordination,  change in mood or  memory.        Current Meds   Medication Sig  . acyclovir (ZOVIRAX) 800 MG tablet Take 400 mg by mouth once a week.   Marland Kitchen amiodarone (PACERONE) 200 MG tablet Take 1 tablet (200 mg total) by mouth 2 (two) times daily.  Marland Kitchen aspirin 81 MG tablet Take 81 mg by mouth daily.    . blood glucose meter kit and supplies Dispense based on patient and insurance preference. Use up to four times daily as directed. (FOR ICD-10 E10.9, E11.9).  . carvedilol (COREG) 6.25 MG tablet Take 1 tablet (6.25 mg total) by mouth 2 (two) times daily with a meal. (Patient taking differently: Take 6.25 mg by mouth 2 (two) times daily with a meal. Takes half a tablet twice daily)  . empagliflozin (JARDIANCE) 10 MG TABS tablet Take 10 mg by mouth daily.  Marland Kitchen ipratropium (ATROVENT) 0.06 % nasal spray USE 2 SPRAYS INTO BOTH NOSTRILS 3 (THREE) TIMES DAILY.  . metFORMIN (GLUCOPHAGE-XR) 500 MG 24 hr tablet TAKE 2 TABLETS (1,000 MG TOTAL) BY MOUTH AT BEDTIME.  Marland Kitchen mexiletine (MEXITIL) 150 MG capsule Take 1 capsule (150 mg total) by mouth 3 (three) times daily.  . rosuvastatin (CRESTOR) 10 MG tablet Take 1 tablet (10 mg total) by mouth daily.  . sacubitril-valsartan (ENTRESTO) 24-26 MG Take 1 tablet by mouth 2 (two) times daily.               07/16/2019        172  04/12/2019        167  02/22/19 162 lb 12.8 oz (73.8 kg)  02/14/19 168 lb 4.8 oz (76.3 kg)  01/31/19 183 lb 8 oz (83.2 kg)  01/23/19 179 lb 9.6 oz (81.5 kg)  01/19/19 179 lb 6.4 oz (81.4 kg)    Vital signs reviewed - Note on arrival 02 sats  98% on RA    Physical Exam       Hoarse thin amb wm/ freq throat clearing    HEENT: nl dentition, turbinates bilaterally, and oropharynx. Nl external ear canals without cough reflex   NECK :  without JVD/Nodes/TM/ nl carotid upstrokes bilaterally   LUNGS: no acc muscle use,  Nl contour chest which is clear to A and P bilaterally without cough on insp or exp maneuvers   CV:  RRR  no s3 or murmur or increase in P2, and no edema   ABD:  soft and  nontender with nl inspiratory excursion in the supine position. No bruits or organomegaly appreciated, bowel sounds nl  MS:  Nl gait/ ext warm without deformities, calf tenderness, cyanosis or clubbing No obvious joint restrictions   SKIN: warm and dry without lesions    NEURO:  alert, approp, nl sensorium with  no motor or cerebellar deficits apparent.       I personally reviewed images -  impression as follows:   CT chest 05/04/2019 mild reticular makings lung bases - non-specific and can't tell if new or not vs priors    Assessment & Plan:

## 2019-07-16 NOTE — Telephone Encounter (Signed)
Per Pt, mexilitine is still expensive.  Will discuss at upcoming appt with Dr. Lovena Le.

## 2019-07-16 NOTE — Patient Instructions (Addendum)
Pulse oximeter and chlorpheniramine can be obtained over the phone  Monitor your pulse oximetry at peak levels of exercise to see if trending down over time   For drainage / throat tickle try take CHLORPHENIRAMINE  4 mg  (Chlortab 4mg   at [employer] should be easiest to find in the green box)  take one every 4 hours as needed - available over the counter- may cause drowsiness so start with just a bedtime dose or two and see how you tolerate it before trying in daytime    Please schedule a follow up visit in 6  months but call sooner if needed

## 2019-07-20 ENCOUNTER — Encounter: Payer: Self-pay | Admitting: Internal Medicine

## 2019-07-20 ENCOUNTER — Ambulatory Visit (INDEPENDENT_AMBULATORY_CARE_PROVIDER_SITE_OTHER): Payer: Medicare HMO | Admitting: Internal Medicine

## 2019-07-20 ENCOUNTER — Other Ambulatory Visit: Payer: Self-pay

## 2019-07-20 VITALS — BP 100/64 | HR 62 | Ht 70.0 in | Wt 176.0 lb

## 2019-07-20 DIAGNOSIS — Z95810 Presence of automatic (implantable) cardiac defibrillator: Secondary | ICD-10-CM

## 2019-07-20 DIAGNOSIS — I5022 Chronic systolic (congestive) heart failure: Secondary | ICD-10-CM

## 2019-07-20 DIAGNOSIS — I472 Ventricular tachycardia, unspecified: Secondary | ICD-10-CM

## 2019-07-20 DIAGNOSIS — I255 Ischemic cardiomyopathy: Secondary | ICD-10-CM | POA: Diagnosis not present

## 2019-07-20 LAB — CUP PACEART INCLINIC DEVICE CHECK
Date Time Interrogation Session: 20200821040000
HighPow Impedance: 53 Ohm
Implantable Lead Implant Date: 20050203
Implantable Lead Implant Date: 20190510
Implantable Lead Location: 753859
Implantable Lead Location: 753860
Implantable Lead Model: 5076
Implantable Lead Model: 675
Implantable Lead Serial Number: 500562
Implantable Pulse Generator Implant Date: 20190510
Lead Channel Impedance Value: 3000 Ohm
Lead Channel Impedance Value: 396 Ohm
Lead Channel Impedance Value: 557 Ohm
Lead Channel Pacing Threshold Amplitude: 0.9 V
Lead Channel Pacing Threshold Amplitude: 1.2 V
Lead Channel Pacing Threshold Pulse Width: 0.4 ms
Lead Channel Pacing Threshold Pulse Width: 0.4 ms
Lead Channel Sensing Intrinsic Amplitude: 12.3 mV
Lead Channel Sensing Intrinsic Amplitude: 4.3 mV
Lead Channel Setting Pacing Amplitude: 0.1 V
Lead Channel Setting Pacing Amplitude: 2 V
Lead Channel Setting Pacing Amplitude: 2.2 V
Lead Channel Setting Pacing Pulse Width: 0.1 ms
Lead Channel Setting Pacing Pulse Width: 0.4 ms
Lead Channel Setting Sensing Sensitivity: 0.6 mV
Lead Channel Setting Sensing Sensitivity: 1 mV
Pulse Gen Serial Number: 492955

## 2019-07-20 NOTE — Progress Notes (Signed)
HPI Lee Hall returns today for followup. He is a pleasant 68 yo man with an ICM, s/p anterior MI remotely, with VT, s/p 2 VT ablations in [place], who has moved to [facility]. The patient has had increasing amounts of slow VT below his detection rate, typically in the 110-130 rate. His VT detections start at 140/min. The patient has not had syncope and when he goes into VT, he feels weaker but no syncope or chest pain or sob. Since I last saw him he has had TFT's which demonstrated he was euthyroid. He has been on fairly high dose amiodarone along with tid mexitil and his symptoms have been controlled. He has not had chest pain and his dyspnea is class 2. He denies any ICD therapies.  No Known Allergies   Current Outpatient Medications  Medication Sig Dispense Refill  . acyclovir (ZOVIRAX) 800 MG tablet Take 400 mg by mouth once a week.     Marland Kitchen aspirin 81 MG tablet Take 81 mg by mouth daily.      . blood glucose meter kit and supplies Dispense based on patient and insurance preference. Use up to four times daily as directed. (FOR ICD-10 E10.9, E11.9). 1 each 0  . carvedilol (COREG) 6.25 MG tablet Take 3.125 mg by mouth 2 (two) times daily with a meal.    . empagliflozin (JARDIANCE) 10 MG TABS tablet Take 10 mg by mouth daily. 90 tablet 3  . ipratropium (ATROVENT) 0.06 % nasal spray USE 2 SPRAYS INTO BOTH NOSTRILS 3 (THREE) TIMES DAILY. 60 mL 1  . metFORMIN (GLUCOPHAGE-XR) 500 MG 24 hr tablet Take 500 mg by mouth 2 (two) times daily.    Marland Kitchen mexiletine (MEXITIL) 150 MG capsule Take 1 capsule (150 mg total) by mouth 3 (three) times daily. 270 capsule 3  . rosuvastatin (CRESTOR) 10 MG tablet Take 1 tablet (10 mg total) by mouth daily. 90 tablet 3  . sacubitril-valsartan (ENTRESTO) 24-26 MG Take 1 tablet by mouth 2 (two) times daily. 180 tablet 3  . amiodarone (PACERONE) 200 MG tablet Take 1 tablet (200 mg total) by mouth 2 (two) times daily. 180 tablet 3   No current facility-administered  medications for this visit.      Past Medical History:  Diagnosis Date  . Anxiety   . CAD (coronary artery disease)   . Diabetes mellitus type II, controlled ([place])   . Heart failure, systolic, acute on chronic ([place])   . ICD (implantable cardiac defibrillator), single, in situ   . ISCHEMIC CARDIOMYOPATHY 03/19/2009   Qualifier: Diagnosis of  By: Boyce Medici RN, BSN, [place]    . LEFT VENTRICULAR MURAL THROMBUS 02/18/2009   Qualifier: Diagnosis of  By: Stevie Kern NP-C, Sharyn Lull    . Melanoma ([place]) 2019  . MI (myocardial infarction) ([place]) 08/2003  . PAROXYSMAL VENTRICULAR TACHYCARDIA 03/19/2009   Qualifier: Diagnosis of  By: Boyce Medici RN, BSN, [place]    . Pneumonia 02/08/2019  . SYSTOLIC HEART FAILURE, ACUTE ON CHRONIC 01/02/2009   Qualifier: Diagnosis of  By: Stevie Kern NP-C, Sharyn Lull    . Ventricular tachycardia ([place])     ROS:   All systems reviewed and negative except as noted in the HPI.   Past Surgical History:  Procedure Laterality Date  . ABLATION  03/2018   IN [place]  . AXILLARY LYMPH NODE DISSECTION Right 10/05/2018   RIGHT AXILLARY LYMPH NODE DISSECTION   . AXILLARY LYMPH NODE DISSECTION Right 10/05/2018   Procedure: RIGHT AXILLARY LYMPH NODE DISSECTION;  Surgeon: Stark Klein, MD;  Location: [place];  Service: General;  Laterality: Right;  . CARDIAC CATHETERIZATION     01/30/18 [facility]): Occluded LAD. Patent LCX and RCA. Normal LVEDP 14 mmHg. Medical managment.  Randolm Idol / REPLACE / REMOVE PACEMAKER    . SKIN BIOPSY  2019   melanoma R shoulder  . TONSILLECTOMY AND ADENOIDECTOMY  1969     Family History  Problem Relation Age of Onset  . Alzheimer's disease Mother   . Kidney Stones Father   . Diabetes Father   . Alcohol abuse Father   . Alzheimer's disease Maternal Grandmother      Social History   Socioeconomic History  . Marital status: Single    Spouse name: Not on file  . Number of children: Not on file  . Years of education: Not on file  . Highest  education level: Not on file  Occupational History  . Occupation: Unemployed since 10/2008  Social Needs  . Financial resource strain: Not on file  . Food insecurity    Worry: Not on file    Inability: Not on file  . Transportation needs    Medical: Not on file    Non-medical: Not on file  Tobacco Use  . Smoking status: Former Smoker    Packs/day: 1.00    Years: 20.00    Pack years: 20.00    Types: Cigars, Cigarettes    Quit date: 02/22/2004    Years since quitting: 15.4  . Smokeless tobacco: Never Used  Substance and Sexual Activity  . Alcohol use: Yes    Comment: 3 x week  . Drug use: No  . Sexual activity: Not Currently  Lifestyle  . Physical activity    Days per week: Not on file    Minutes per session: Not on file  . Stress: Not on file  Relationships  . Social Herbalist on phone: Not on file    Gets together: Not on file    Attends religious service: Not on file    Active member of club or organization: Not on file    Attends meetings of clubs or organizations: Not on file    Relationship status: Not on file  . Intimate partner violence    Fear of current or ex partner: Not on file    Emotionally abused: Not on file    Physically abused: Not on file    Forced sexual activity: Not on file  Other Topics Concern  . Not on file  Social History Narrative   Single   Lives with sister   No regular exercise     BP 100/64   Pulse 62   Ht _0  (1.778 m)   Wt 176 lb (79.8 kg)   SpO2 97%   BMI 25.25 kg/m   Physical Exam:  Well appearing NAD HEENT: Unremarkable Neck:  No JVD, no thyromegally Lymphatics:  No adenopathy Back:  No CVA tenderness Lungs:  Clear with no wheezes HEART:  Regular rate rhythm, no murmurs, no rubs, no clicks Abd:  soft, positive bowel sounds, no organomegally, no rebound, no guarding Ext:  2 plus pulses, no edema, no cyanosis, no clubbing Skin:  No rashes no nodules Neuro:  CN II through XII intact, motor grossly  intact  EKG - NSR with LBBB  DEVICE  Normal device function.  See PaceArt for details.   Assess/Plan: 1. VT - his VT has been controlled. He will reduce his dose of  amio from 400 to 300 mg daily. 2. Chronic systolic heart failure - his symptoms are class 2. He is on good medical therapy and his bp is controlled.  3. ICD - his [employer] device is working normally except that his LV lead could not be placed in [place]. No plans for now to attempt another biv ICD procedure.  4. Thyroid dysfunction - I am worried about this but most recent TFT's were normal.  Mikle Bosworth.D.

## 2019-07-20 NOTE — Patient Instructions (Addendum)
Your physician has recommended you make the following change in your medication:  DECREASE AMIODARONE TO 200 MG 1 TAB IN AM AND 1/2 TAB IN PM   Your physician wants you to follow-up in: [place] will receive a reminder letter in the mail two months in advance. If you don't receive a letter, please call our office to schedule the follow-up appointment.

## 2019-07-20 NOTE — Assessment & Plan Note (Addendum)
Pt states on amiodarone 2005-2012  Resumed amiodarone 200 bid 02/2018  - 04/12/2019   Walked RA  2 laps @  approx 272ft each @ mod fast  pace  stopped due to  End of study, no sob or desats   - CT chest 05/04/2019 mild reticular makings lung bases - non-specific and can't tell if new or not vs priors - 07/16/2019   Walked RA  2 laps @  approx 281ft each @ moderately fast pace  stopped due to  End of study, no sob and sats 96%     Patients typically have been on amiodarone for 6-12 months before this complication manifests.  Of note, serial clinical evaluation for symptoms such as cough dyspnea or fevers is  the preferred method of monitoring for pulmonary toxicity because a decrease in DLCO or lung volumes is a nonspecific for toxicity. Pathologically amiodarone pulmonary toxicity may appear as interstitial pneumonitis, eosinophilic pneumonia, organizing pneumonia, pulmonary fibrosis or less commonly as diffuse alveolar hemorrhage, pulmonary nodules or pleural effusions.  Risk factors for pulmonary toxicity include age greater than 56, daily dose greater than equal to 400 mg, a high cumulative dose, or pre-existing lung disease    Overall low risk, rec monitor sats at peak ex/ ct chest planned for 10/2019 and f/u here after that

## 2019-07-20 NOTE — Assessment & Plan Note (Signed)
Onset while on ACEi  - on Entresto as of  04/12/2019 and tolerated ok with addition  of atrovent to control pnds  - 07/16/2019 trial of 1st gen H1 blockers per guidelines    I had an extended discussion with the patient  reviewing all relevant studies completed to date and  lasting 15 to 20 minutes of a 25 minute visit  which included directly observing ambulatory 02 saturation study documented in a/p section of  today's  office note.  Each maintenance medication was reviewed in detail including most importantly the difference between maintenance and prns and under what circumstances the prns are to be triggered using an action plan format that is not reflected in the computer generated alphabetically organized AVS.     Please see AVS for specific instructions unique to this visit that I personally wrote and verbalized to the the pt in detail and then reviewed with pt  by my nurse highlighting any changes in therapy recommended at today's visit .

## 2019-07-23 DIAGNOSIS — H04123 Dry eye syndrome of bilateral lacrimal glands: Secondary | ICD-10-CM | POA: Diagnosis not present

## 2019-07-23 DIAGNOSIS — H25041 Posterior subcapsular polar age-related cataract, right eye: Secondary | ICD-10-CM | POA: Diagnosis not present

## 2019-07-23 DIAGNOSIS — Z961 Presence of intraocular lens: Secondary | ICD-10-CM | POA: Diagnosis not present

## 2019-07-23 DIAGNOSIS — H25011 Cortical age-related cataract, right eye: Secondary | ICD-10-CM | POA: Diagnosis not present

## 2019-07-23 DIAGNOSIS — H2511 Age-related nuclear cataract, right eye: Secondary | ICD-10-CM | POA: Diagnosis not present

## 2019-07-25 ENCOUNTER — Ambulatory Visit (INDEPENDENT_AMBULATORY_CARE_PROVIDER_SITE_OTHER): Payer: Medicare HMO | Admitting: *Deleted

## 2019-07-25 DIAGNOSIS — I255 Ischemic cardiomyopathy: Secondary | ICD-10-CM | POA: Diagnosis not present

## 2019-07-26 ENCOUNTER — Encounter: Payer: Self-pay | Admitting: Family Medicine

## 2019-07-26 DIAGNOSIS — C439 Malignant melanoma of skin, unspecified: Secondary | ICD-10-CM

## 2019-07-26 LAB — CUP PACEART REMOTE DEVICE CHECK
Battery Remaining Longevity: 132 mo
Battery Remaining Percentage: 100 %
Brady Statistic RA Percent Paced: 67 %
Brady Statistic RV Percent Paced: 0 %
Date Time Interrogation Session: 20200827145100
HighPow Impedance: 53 Ohm
Implantable Lead Implant Date: 20050203
Implantable Lead Implant Date: 20190510
Implantable Lead Location: 753859
Implantable Lead Location: 753860
Implantable Lead Model: 5076
Implantable Lead Model: 675
Implantable Lead Serial Number: 500562
Implantable Pulse Generator Implant Date: 20190510
Lead Channel Impedance Value: 388 Ohm
Lead Channel Impedance Value: 575 Ohm
Lead Channel Pacing Threshold Amplitude: 0.7 V
Lead Channel Pacing Threshold Amplitude: 1.1 V
Lead Channel Pacing Threshold Pulse Width: 0.4 ms
Lead Channel Pacing Threshold Pulse Width: 0.4 ms
Lead Channel Setting Pacing Amplitude: 0.1 V
Lead Channel Setting Pacing Amplitude: 2 V
Lead Channel Setting Pacing Amplitude: 2.2 V
Lead Channel Setting Pacing Pulse Width: 0.1 ms
Lead Channel Setting Pacing Pulse Width: 0.4 ms
Lead Channel Setting Sensing Sensitivity: 0.6 mV
Lead Channel Setting Sensing Sensitivity: 1 mV
Pulse Gen Serial Number: 492955

## 2019-08-01 DIAGNOSIS — H25041 Posterior subcapsular polar age-related cataract, right eye: Secondary | ICD-10-CM | POA: Diagnosis not present

## 2019-08-01 DIAGNOSIS — H25011 Cortical age-related cataract, right eye: Secondary | ICD-10-CM | POA: Diagnosis not present

## 2019-08-01 DIAGNOSIS — H25811 Combined forms of age-related cataract, right eye: Secondary | ICD-10-CM | POA: Diagnosis not present

## 2019-08-01 DIAGNOSIS — H2511 Age-related nuclear cataract, right eye: Secondary | ICD-10-CM | POA: Diagnosis not present

## 2019-08-03 ENCOUNTER — Encounter: Payer: Self-pay | Admitting: Cardiology

## 2019-08-03 DIAGNOSIS — Z85820 Personal history of malignant melanoma of skin: Secondary | ICD-10-CM | POA: Diagnosis not present

## 2019-08-03 DIAGNOSIS — L570 Actinic keratosis: Secondary | ICD-10-CM | POA: Diagnosis not present

## 2019-08-03 NOTE — Progress Notes (Signed)
Remote ICD transmission.   

## 2019-08-26 ENCOUNTER — Encounter: Payer: Self-pay | Admitting: Family Medicine

## 2019-08-28 NOTE — Telephone Encounter (Signed)
Needs pneumococcal 23 again too. Please add on note to nurse schedule.

## 2019-08-30 LAB — HM DIABETES EYE EXAM

## 2019-08-31 ENCOUNTER — Other Ambulatory Visit: Payer: Self-pay | Admitting: Family Medicine

## 2019-08-31 ENCOUNTER — Encounter: Payer: Self-pay | Admitting: Family Medicine

## 2019-08-31 DIAGNOSIS — Z20828 Contact with and (suspected) exposure to other viral communicable diseases: Secondary | ICD-10-CM

## 2019-08-31 DIAGNOSIS — E1165 Type 2 diabetes mellitus with hyperglycemia: Secondary | ICD-10-CM

## 2019-08-31 DIAGNOSIS — Z1159 Encounter for screening for other viral diseases: Secondary | ICD-10-CM

## 2019-09-04 ENCOUNTER — Ambulatory Visit (INDEPENDENT_AMBULATORY_CARE_PROVIDER_SITE_OTHER): Payer: Medicare HMO

## 2019-09-04 DIAGNOSIS — I5022 Chronic systolic (congestive) heart failure: Secondary | ICD-10-CM | POA: Diagnosis not present

## 2019-09-04 DIAGNOSIS — Z95810 Presence of automatic (implantable) cardiac defibrillator: Secondary | ICD-10-CM

## 2019-09-05 DIAGNOSIS — H524 Presbyopia: Secondary | ICD-10-CM | POA: Diagnosis not present

## 2019-09-05 DIAGNOSIS — H5213 Myopia, bilateral: Secondary | ICD-10-CM | POA: Diagnosis not present

## 2019-09-05 DIAGNOSIS — H52209 Unspecified astigmatism, unspecified eye: Secondary | ICD-10-CM | POA: Diagnosis not present

## 2019-09-07 NOTE — Progress Notes (Signed)
EPIC Encounter for ICM Monitoring  Patient Name: Lee Hall is a 68 y.o. male Date: 09/07/2019 Primary Care Physican: [name], MD Primary Cardiologist:Taylor Electrophysiologist:Taylor Weight: unknown   Spoke with patient. He is asymptomatic.  HeartLogic Heart Failure Index7 which is within normal threshold   Prescribed:No diuretic  Recommendations: No changes and encouraged to call if experiencing any fluid symptoms.  Follow-up plan: ICM clinic phone appointment on 10/24/2019.  Office appointment scheduled 10/17/2019 with Dr. Lovena Le.        Copy of ICM check sent to Dr. Lovena Le.     Rosalene Billings, RN 09/07/2019 10:24 AM

## 2019-09-13 ENCOUNTER — Ambulatory Visit (INDEPENDENT_AMBULATORY_CARE_PROVIDER_SITE_OTHER): Payer: Medicare HMO

## 2019-09-13 ENCOUNTER — Telehealth: Payer: Self-pay

## 2019-09-13 ENCOUNTER — Other Ambulatory Visit: Payer: Self-pay

## 2019-09-13 DIAGNOSIS — Z23 Encounter for immunization: Secondary | ICD-10-CM | POA: Diagnosis not present

## 2019-09-13 NOTE — Addendum Note (Signed)
Addended by: Rebecca Eaton on: 09/13/2019 09:32 AM   Modules accepted: Orders

## 2019-09-14 DIAGNOSIS — B0089 Other herpesviral infection: Secondary | ICD-10-CM | POA: Diagnosis not present

## 2019-09-14 DIAGNOSIS — B078 Other viral warts: Secondary | ICD-10-CM | POA: Diagnosis not present

## 2019-09-14 DIAGNOSIS — D2262 Melanocytic nevi of left upper limb, including shoulder: Secondary | ICD-10-CM | POA: Diagnosis not present

## 2019-09-14 DIAGNOSIS — D2261 Melanocytic nevi of right upper limb, including shoulder: Secondary | ICD-10-CM | POA: Diagnosis not present

## 2019-09-14 DIAGNOSIS — Z85820 Personal history of malignant melanoma of skin: Secondary | ICD-10-CM | POA: Diagnosis not present

## 2019-09-16 ENCOUNTER — Encounter: Payer: Self-pay | Admitting: Family Medicine

## 2019-09-17 ENCOUNTER — Other Ambulatory Visit: Payer: Self-pay | Admitting: Family Medicine

## 2019-09-21 NOTE — Telephone Encounter (Signed)
Please advise on behalf of Jamie's absence

## 2019-09-26 ENCOUNTER — Emergency Department (HOSPITAL_BASED_OUTPATIENT_CLINIC_OR_DEPARTMENT_OTHER): Payer: Medicare HMO

## 2019-09-26 ENCOUNTER — Other Ambulatory Visit: Payer: Self-pay

## 2019-09-26 ENCOUNTER — Telehealth: Payer: Self-pay | Admitting: Physician Assistant

## 2019-09-26 ENCOUNTER — Emergency Department (HOSPITAL_BASED_OUTPATIENT_CLINIC_OR_DEPARTMENT_OTHER)
Admission: EM | Admit: 2019-09-26 | Discharge: 2019-09-26 | Discharge status: Against Medical Advice | Payer: Medicare HMO | Attending: Emergency Medicine | Admitting: Emergency Medicine

## 2019-09-26 ENCOUNTER — Observation Stay: Admission: AD | Admit: 2019-09-26 | Payer: Medicare HMO | Source: Other Acute Inpatient Hospital | Admitting: Cardiology

## 2019-09-26 ENCOUNTER — Telehealth: Payer: Self-pay

## 2019-09-26 ENCOUNTER — Encounter (HOSPITAL_BASED_OUTPATIENT_CLINIC_OR_DEPARTMENT_OTHER): Payer: Self-pay

## 2019-09-26 DIAGNOSIS — Z79899 Other long term (current) drug therapy: Secondary | ICD-10-CM | POA: Insufficient documentation

## 2019-09-26 DIAGNOSIS — E119 Type 2 diabetes mellitus without complications: Secondary | ICD-10-CM | POA: Insufficient documentation

## 2019-09-26 DIAGNOSIS — I4891 Unspecified atrial fibrillation: Secondary | ICD-10-CM | POA: Diagnosis not present

## 2019-09-26 DIAGNOSIS — R079 Chest pain, unspecified: Secondary | ICD-10-CM

## 2019-09-26 DIAGNOSIS — Z7984 Long term (current) use of oral hypoglycemic drugs: Secondary | ICD-10-CM | POA: Diagnosis not present

## 2019-09-26 DIAGNOSIS — Z950 Presence of cardiac pacemaker: Secondary | ICD-10-CM | POA: Diagnosis not present

## 2019-09-26 DIAGNOSIS — R0789 Other chest pain: Secondary | ICD-10-CM | POA: Insufficient documentation

## 2019-09-26 DIAGNOSIS — E875 Hyperkalemia: Secondary | ICD-10-CM

## 2019-09-26 DIAGNOSIS — I2510 Atherosclerotic heart disease of native coronary artery without angina pectoris: Secondary | ICD-10-CM | POA: Diagnosis not present

## 2019-09-26 DIAGNOSIS — I252 Old myocardial infarction: Secondary | ICD-10-CM | POA: Diagnosis not present

## 2019-09-26 DIAGNOSIS — I110 Hypertensive heart disease with heart failure: Secondary | ICD-10-CM | POA: Diagnosis not present

## 2019-09-26 DIAGNOSIS — Z7982 Long term (current) use of aspirin: Secondary | ICD-10-CM | POA: Diagnosis not present

## 2019-09-26 DIAGNOSIS — Z87891 Personal history of nicotine dependence: Secondary | ICD-10-CM | POA: Insufficient documentation

## 2019-09-26 DIAGNOSIS — I5023 Acute on chronic systolic (congestive) heart failure: Secondary | ICD-10-CM | POA: Diagnosis not present

## 2019-09-26 LAB — BASIC METABOLIC PANEL
Anion gap: 9 (ref 5–15)
BUN: 25 mg/dL — ABNORMAL HIGH (ref 8–23)
CO2: 25 mmol/L (ref 22–32)
Calcium: 9.4 mg/dL (ref 8.9–10.3)
Chloride: 105 mmol/L (ref 98–111)
Creatinine, Ser: 1.43 mg/dL — ABNORMAL HIGH (ref 0.61–1.24)
GFR calc Af Amer: 59 mL/min — ABNORMAL LOW (ref >60)
GFR calc non Af Amer: 51 mL/min — ABNORMAL LOW (ref >60)
Glucose, Bld: 127 mg/dL — ABNORMAL HIGH (ref 70–99)
Potassium: 5.4 mmol/L — ABNORMAL HIGH (ref 3.5–5.1)
Sodium: 139 mmol/L (ref 135–145)

## 2019-09-26 LAB — CBC
HCT: 45 % (ref 39.0–52.0)
Hemoglobin: 14.4 g/dL (ref 13.0–17.0)
MCH: 31.1 pg (ref 26.0–34.0)
MCHC: 32 g/dL (ref 30.0–36.0)
MCV: 97.2 fL (ref 80.0–100.0)
Platelets: 171 10*3/uL (ref 150–400)
RBC: 4.63 MIL/uL (ref 4.22–5.81)
RDW: 14 % (ref 11.5–15.5)
WBC: 6.1 10*3/uL (ref 4.0–10.5)
nRBC: 0 % (ref 0.0–0.2)

## 2019-09-26 LAB — TROPONIN I (HIGH SENSITIVITY): Troponin I (High Sensitivity): 5 ng/L (ref <18)

## 2019-09-26 MED ORDER — ASPIRIN EC 325 MG PO TBEC
325.0000 mg | DELAYED_RELEASE_TABLET | Freq: Once | ORAL | Status: AC
  Administered 2019-09-26: 325 mg via ORAL
  Filled 2019-09-26: qty 1

## 2019-09-26 MED ORDER — SODIUM CHLORIDE 0.9% FLUSH
3.0000 mL | Freq: Once | INTRAVENOUS | Status: DC
  Filled 2019-09-26: qty 3

## 2019-09-26 NOTE — Telephone Encounter (Signed)
Dr. Ellyn Hack took call from EDP at [facility] about patient with chest pain requiring admission. Per Dr. Allison Quarry report from [place], the patient ended up leaving AMA. Patient will need an early f/u appt in the office, will send staff msg to scheduling to call him. Dayna Dunn PA-C

## 2019-09-26 NOTE — ED Notes (Addendum)
Marcello Moores, carelink to page cardiology to [place] phone [phone].

## 2019-09-26 NOTE — Telephone Encounter (Signed)
Per notes in Epic, patient presented to [place] ED at 13:21. LMOVM requesting call back to DC if he still needs monitor troubleshooting assistance when he returns home. Direct number and office hours given.

## 2019-09-26 NOTE — ED Provider Notes (Signed)
[facility] EMERGENCY DEPARTMENT Provider Note   CSN: [id number] Arrival date & time: 09/26/19  1317     History   Chief Complaint Chief Complaint  Patient presents with  . Chest Pain    HPI Lee Hall is a 68 y.o. male has no history of CAD, diabetes, heart failure, EF of 20-25%, ischemic cardiomyopathy, MI, paroxysmal VT with current pacemaker who presents for evaluation of chest pain.  He reports that he woke up at about 3 AM and noticed a cramping pain in his left chest.  He states that it never went away but it got lighter.  He states that the only way he was able to get comfortable was by sleeping up in his chair.  He states that the pain became less intense but then about an hour later, he was woken up again by the intense pain.  He describes it as a cramping.  He states that since onset, is never gone away completely but has become less severe.  He reports about an hour prior to ED arrival, he started experiencing some tingling in his left arm and that he describes as a 1.5, prompting ED visit.  Currently he reports that the cramping pain is a 3/10.  Reports is on the left side of his chest and does not radiate.  He also reports he has had some associated lightheadedness.  Denies any nausea/vomiting or diaphoresis.  Denies any shortness of breath.  Reports that his pain was not worse with deep inspiration or with exertion.  No recent sicknesses and denies any recent fevers, chills, cough.  He states he was a former smoker but quit in 2004.  He does have history of CAD, MI, hypertension, diabetes.   Cardiology: [name]  HPI: A 67 year old patient with a history of hypertension presents for evaluation of chest pain. Initial onset of pain was less than one hour ago. The patient's chest pain is described as heaviness/pressure/tightness and is not worse with exertion. The patient's chest pain is middle- or left-sided, is not well-localized, is not sharp and does  radiate to the arms/jaw/neck. The patient does not complain of nausea and denies diaphoresis. The patient has no history of stroke, has no history of peripheral artery disease, has not smoked in the past 90 days, denies any history of treated diabetes, has no relevant family history of coronary artery disease (first degree relative at less than age 76), has no history of hypercholesterolemia and does not have an elevated BMI (>=30).   The history is provided by the patient.    Past Medical History:  Diagnosis Date  . Anxiety   . CAD (coronary artery disease)   . Diabetes mellitus type II, controlled ([place])   . Heart failure, systolic, acute on chronic ([place])   . ICD (implantable cardiac defibrillator), single, in situ   . ISCHEMIC CARDIOMYOPATHY 03/19/2009   Qualifier: Diagnosis of  By: Boyce Medici RN, BSN, [place]    . LEFT VENTRICULAR MURAL THROMBUS 02/18/2009   Qualifier: Diagnosis of  By: Stevie Kern NP-C, Sharyn Lull    . Melanoma ([place]) 2019  . MI (myocardial infarction) ([place]) 08/2003  . PAROXYSMAL VENTRICULAR TACHYCARDIA 03/19/2009   Qualifier: Diagnosis of  By: Boyce Medici RN, BSN, [place]    . Pneumonia 02/08/2019  . SYSTOLIC HEART FAILURE, ACUTE ON CHRONIC 01/02/2009   Qualifier: Diagnosis of  By: Stevie Kern NP-C, Sharyn Lull    . Ventricular tachycardia [facility])     Patient Active Problem List   Diagnosis Date Noted  .  Aortic ectasia, abdominal ([place]) 05/11/2019  . Upper airway cough syndrome 04/13/2019  . Diabetes mellitus type II, controlled ([place]) 02/23/2019  . ARF (acute renal failure) ([place]) 02/23/2019  . DOE (dyspnea on exertion) 02/22/2019  . Pneumonia of right lower lobe due to infectious organism 02/22/2019  . Lower extremity edema 02/22/2019  . Healthcare maintenance 02/22/2019  . Coronary artery disease involving native coronary artery of native heart without angina pectoris   . Chronic systolic congestive heart failure ([place])   . S/P placement of cardiac pacemaker   . Atrial fibrillation ([place])    . History of sustained ventricular tachycardia   . Acute hypoxemic respiratory failure ([place])   . Cardiac arrest ([place])   . Community acquired pneumonia of right lung   . Severe sepsis ([place])   . Septic shock ([place]) 02/01/2019  . Malignant melanoma metastatic to lymph node ([place]) 10/05/2018  . Tremor of right hand 08/07/2018  . Melanoma of skin ([place]) 08/07/2018  . Abnormal positron emission tomography (PET) scan 08/07/2018  . ISCHEMIC CARDIOMYOPATHY 03/19/2009  . PAROXYSMAL VENTRICULAR TACHYCARDIA 03/19/2009  . CAD, NATIVE VESSEL 02/18/2009  . LEFT VENTRICULAR MURAL THROMBUS 02/18/2009  . Chronic systolic heart failure ([place]) 01/02/2009  . TACHYCARDIA 01/02/2009  . Implantable cardioverter-defibrillator (ICD) in situ 01/02/2009    Past Surgical History:  Procedure Laterality Date  . ABLATION  03/2018   IN [place]  . AXILLARY LYMPH NODE DISSECTION Right 10/05/2018   RIGHT AXILLARY LYMPH NODE DISSECTION   . AXILLARY LYMPH NODE DISSECTION Right 10/05/2018   Procedure: RIGHT AXILLARY LYMPH NODE DISSECTION;  Surgeon: Stark Klein, MD;  Location: [facility];  Service: General;  Laterality: Right;  . CARDIAC CATHETERIZATION     01/30/18 [facility]): Occluded LAD. Patent LCX and RCA. Normal LVEDP 14 mmHg. Medical managment.  Marland Kitchen CATARACT EXTRACTION    . INSERT / REPLACE / REMOVE PACEMAKER    . SKIN BIOPSY  2019   melanoma R shoulder  . TONSILLECTOMY AND ADENOIDECTOMY  1969        Home Medications    Prior to Admission medications   Medication Sig Start Date End Date Taking? Authorizing Provider  acyclovir (ZOVIRAX) 800 MG tablet Take 400 mg by mouth once a week.     [provider]  amiodarone (PACERONE) 200 MG tablet Take 1 tablet (200 mg total) by mouth 2 (two) times daily. 06/19/19 07/19/19  Evans Lance, MD  aspirin 81 MG tablet Take 81 mg by mouth daily.      [provider]  blood glucose meter kit and supplies Dispense based on patient and insurance  preference. Use up to four times daily as directed. (FOR ICD-10 E10.9, E11.9). 02/14/19   Hongalgi, Lenis Dickinson, MD  carvedilol (COREG) 6.25 MG tablet Take 3.125 mg by mouth 2 (two) times daily with a meal.    [provider]  empagliflozin (JARDIANCE) 10 MG TABS tablet Take 10 mg by mouth daily. 05/04/19   [name], Andris Flurry C, MD  ipratropium (ATROVENT) 0.06 % nasal spray USE 2 SPRAYS INTO BOTH NOSTRILS 3 (THREE) TIMES DAILY. 07/05/19   [name], [name], MD  metFORMIN (GLUCOPHAGE-XR) 500 MG 24 hr tablet TAKE 2 TABLETS (1,000 MG TOTAL) BY MOUTH AT BEDTIME. 09/18/19   [name], Junell C, MD  mexiletine (MEXITIL) 150 MG capsule Take 1 capsule (150 mg total) by mouth 3 (three) times daily. 06/19/19   Evans Lance, MD  rosuvastatin (CRESTOR) 10 MG tablet Take 1 tablet (10 mg total) by mouth  daily. 06/19/19 09/17/19  Evans Lance, MD  sacubitril-valsartan (ENTRESTO) 24-26 MG Take 1 tablet by mouth 2 (two) times daily. 06/19/19   Evans Lance, MD    Family History Family History  Problem Relation Age of Onset  . Alzheimer's disease Mother   . Kidney Stones Father   . Diabetes Father   . Alcohol abuse Father   . Alzheimer's disease Maternal Grandmother     Social History Social History   Tobacco Use  . Smoking status: Former Smoker    Packs/day: 1.00    Years: 20.00    Pack years: 20.00    Types: Cigars, Cigarettes    Quit date: 02/22/2004    Years since quitting: 15.6  . Smokeless tobacco: Never Used  Substance Use Topics  . Alcohol use: Yes    Comment: weekly  . Drug use: No     Allergies   Patient has no known allergies.   Review of Systems Review of Systems  Constitutional: Negative for diaphoresis and fever.  Respiratory: Positive for chest tightness. Negative for cough and shortness of breath.   Cardiovascular: Negative for chest pain.  Gastrointestinal: Negative for abdominal pain, nausea and vomiting.  Genitourinary: Negative for dysuria and hematuria.   Neurological: Positive for numbness (tingling). Negative for weakness and headaches.  All other systems reviewed and are negative.    Physical Exam Updated Vital Signs BP 109/69   Pulse 60   Temp 97.9 F (36.6 C) (Oral)   Resp 13   Ht '5\' 10"'  (1.778 m)   Wt 75.3 kg   SpO2 99%   BMI 23.82 kg/m   Physical Exam Vitals signs and nursing note reviewed.  Constitutional:      Appearance: Normal appearance. He is well-developed.  HENT:     Head: Normocephalic and atraumatic.  Eyes:     General: Lids are normal.     Conjunctiva/sclera: Conjunctivae normal.     Pupils: Pupils are equal, round, and reactive to light.  Neck:     Musculoskeletal: Full passive range of motion without pain.  Cardiovascular:     Rate and Rhythm: Normal rate and regular rhythm.     Pulses: Normal pulses.          Radial pulses are 2+ on the right side and 2+ on the left side.       Dorsalis pedis pulses are 2+ on the right side and 2+ on the left side.     Heart sounds: Normal heart sounds. No murmur. No friction rub. No gallop.   Pulmonary:     Effort: Pulmonary effort is normal.     Breath sounds: Normal breath sounds.     Comments: Lungs clear to auscultation bilaterally.  Symmetric chest rise.  No wheezing, rales, rhonchi. Abdominal:     Palpations: Abdomen is soft. Abdomen is not rigid.     Tenderness: There is no abdominal tenderness. There is no guarding.     Comments: Abdomen is soft, non-distended, non-tender. No rigidity, No guarding. No peritoneal signs.  Musculoskeletal: Normal range of motion.  Skin:    General: Skin is warm and dry.     Capillary Refill: Capillary refill takes less than 2 seconds.  Neurological:     Mental Status: He is alert and oriented to person, place, and time.  Psychiatric:        Speech: Speech normal.      ED Treatments / Results  Labs (all labs ordered are listed, but only abnormal  results are displayed) Labs Reviewed  BASIC METABOLIC PANEL -  Abnormal; Notable for the following components:      Result Value   Potassium 5.4 (*)    Glucose, Bld 127 (*)    BUN 25 (*)    Creatinine, Ser 1.43 (*)    GFR calc non Af Amer 51 (*)    GFR calc Af Amer 59 (*)    All other components within normal limits  CBC  TROPONIN I (HIGH SENSITIVITY)  TROPONIN I (HIGH SENSITIVITY)    EKG EKG Interpretation  Date/Time:  Wednesday September 26 2019 13:24:43 EDT Ventricular Rate:  67 PR Interval:  190 QRS Duration: 182 QT Interval:  456 QTC Calculation: 481 R Axis:   92 Text Interpretation: AV PACED RHYTHM Confirmed by Fredia Sorrow [phone]) on 09/26/2019 1:28:37 PM   Radiology Dg Chest 2 View  Result Date: 09/26/2019 CLINICAL DATA:  Onset left chest and arm pain this morning. EXAM: CHEST - 2 VIEW COMPARISON:  PA and lateral chest 02/22/2019. FINDINGS: AICD is unchanged. Lungs are clear. Heart size is normal. Calcifications in the wall of the left ventricle are unchanged. Surgical clips right axilla are noted. No pneumothorax or pleural effusion. No acute or focal bony abnormality. IMPRESSION: No acute disease. Electronically Signed   By: Inge Rise M.D.   On: 09/26/2019 13:42    Procedures Procedures (including critical care time)  Medications Ordered in ED Medications  aspirin EC tablet 325 mg (325 mg Oral Given 09/26/19 1417)     Initial Impression / Assessment and Plan / ED Course  I have reviewed the triage vital signs and the nursing notes.  Pertinent labs & imaging results that were available during my care of the patient were reviewed by me and considered in my medical decision making (see chart for details).     HEAR Score: 38  68 year old male who presents for evaluation of chest pain that began 3 AM.  Since then, has been constant but states it waxes and wanes in intensity.  Associated with lightheadedness, tingling in left upper extremity.  No associated nausea/vomiting or diaphoresis.  No shortness of breath.   History of pacemaker and has not had interrogated since incident began.  Initially arrival, he is afebrile, nontoxic-appearing.  Vital signs are stable though blood pressure is little soft.  Concern for ACS etiology though sounds atypical story versus musculoskeletal pain versus infectious etiology.  History/physical exam not concerning for dissection.  We will plan to check labs, EKG.  We will plan to interrogate pacemaker.  He had a cath done in March 2018 that did show occluded LAD and patent CX and RCA.  He had an ablation in May 2018 in [place].  BMP shows potassium of 5.4, BUN of 25, creatinine of 1.43.  CBC without any significant leukocytosis or anemia.  Chest x-ray shows no acute infectious etiology.  Pacemaker was interrogated by [employer].  Report shows no evidence of activity or alerts that were noted today.  No activity represented with timing of his reported chest pain.  Trop is 5.   Discussed patient with Dr. Ellyn Hack (Cardiology).  Given patient's risk factors and the fact that he has known cardiovascular disease, he recommends admission for overnight observation.  I discussed cardiologist recommendation for admission.  Patient did not wish to be admitted.  I discussed risk versus benefits of declining admission, including but not limited to worsening condition, death.  Sister was present for this entire conversation.  I discussed at  length regarding the reasons why we would like to admit him for observation and cardiology is concerned.  Patient expressed full understanding and wished to decline.  I did inform patient that if he chose to leave, he would be leaving [facility].  Patient understands this and expresses agreement.  He appears clinically sober and capable of making medical decision making progress.  His sister was present for this entire conversation.  I instructed patient that he is able to return at any time for any concerns.  I did offer him to wait until  his second troponin came back but patient declined and states that he would rather leave now.  Portions of this note were generated with [employer]. Dictation errors may occur despite best attempts at proofreading.   Final Clinical Impressions(s) / ED Diagnoses   Final diagnoses:  Chest pain, unspecified type  Hyperkalemia    ED Discharge Orders    None       Volanda Napoleon, PA-C 09/26/19 2319    Veryl Speak, MD 09/26/19 [phone]

## 2019-09-26 NOTE — ED Triage Notes (Addendum)
Pt c/o CP started early am-NAD-steady gait

## 2019-09-26 NOTE — Discharge Instructions (Signed)
As we discussed, the recommendation was to admit you to the hospital.  You have chosen not to be admitted and will leave [facility].  We cannot determine the cause of your chest pain today.  As we discussed, your potassium was slightly high today.  This needs to be rechecked by primary care doctor in the next 3 to 4 days.  Call your cardiologist and inform them of this visit and see if you get an appointment within the next 2 to 3 days.  Return the emergency department immediately if you have any worsening chest pain, difficulty breathing, abdominal pain, nausea/vomiting or any other worsening concerning symptoms.

## 2019-09-26 NOTE — Telephone Encounter (Signed)
Pt is having a cramp on his heart. It started last night. Pt states he has some light numbness in left arm. Pt tried to send a manual transmission but we did not receive the transmission. The pt is not near his monitor he is picking up lunch for his co workers. The pt would like a phone call from the device nurse at her earliest convenience. His phone number is [phone].

## 2019-09-28 ENCOUNTER — Encounter: Payer: Self-pay | Admitting: Family Medicine

## 2019-09-30 NOTE — Telephone Encounter (Signed)
Can you look into what issue was with him trying to schedule labs and help him to schedule please? I can still see my order placed from October?

## 2019-10-01 NOTE — Progress Notes (Signed)
Cardiology Office Note Date:  10/03/2019  Patient ID:  Lee, Hall Mar 08, 1951, MRN [id number] PCP:  [name], MD  Electrophysiologist:  Dr. Lovena Le    Chief Complaint: CP, post ER visit  History of Present Illness: Lee Hall is a 68 y.o. male with history of anxiety, melanoma, CAD (MI in 2005.  He has a chronic LAD occlusion with collaterals), ICM, VT, ICD, chronic CHF (systolic), former smoker.  Admitted to [facility] 01/03/2019, with worsening shortness of breath and fatigue.  Notes indicate he was being treated for community-acquired pneumonia which was not responding to outpatient medical treatment.  He was initially on doxycycline, but had a follow-up and was switched to levofloxacin.  In the ER he became increasingly diaphoretic, with worsening hypoxia.  He deteriorated rapidly ultimately requiring intubation and had a brief cardiac arrest with 1 cycle of CPR and epinephrine given, suspect septic shock 2/2 CAP, CHF.  Discharged 02/14/2019.  03/26/2019, [place] transmission with slow VT, pt mildly symptomatic with palpitations, managed at home with additional mexiletine/amio via Dr. Tanna Furry recommendations. He had f/u with Dr. Lovena Le 04/04/2019, noted history of 2 VT ablations in [place], given 2 unsuccessful ablations, did not feel another was reasonable. Planned to continue same treatment, given minimal symptoms with his VT.Marland Kitchen  At his visit with Dr. Lovena Le 07/20/2019, he had not had further VT and amiodarone was reduced from 413m daily to 3052m Class II symptoms, given failed [place].  Noted concerns of hx of thyroid issues, was currently wnl, planned to follow.  [facility], described as a cramp, woke with it, lessened, though persisted and went for evaluation, some associated lightheadedness.  His HS Trop was 5,  EKG described as AV paced, though given his history,  in d/w cardiology on call recommended 24hr obs. The pt declined admission.  LABS K+ 5.4 BUN/Creat 25/1.43 WBC 6.1 H/H 14/45 Plts 171 HS Trop 5  EKG to me looks SR, LBBB (QRS 18049mCXR:, NAD   He comes alone though his sister on speaker phone for our visit.  The patient recaps for me his [asBetsey Amenrdia history, mentioning that not much worries him anymore, has been going through this "heart stuff" for years, but the chest discomfort he had that he went to the ER for scared him. Until then he has felt quite well, no cp, palpitations No exertional intolerances, no syncope, near syncope, no shocks  He does mention intermittently feels "foggy" in his head, not like he will faint, but perhaps lightheaded, occasionally can get to lightheaded associated with blurred/dimmed vision, this can last minutes to hours and he suspect his BP gets low.  His amio has been decreased as well as his coreg though has not noted any particular change.  This does not appear to be interruptive to his life.  The day prior to his ER visit he was woken by a cramp to his L chest.  It was constant through the day, never gone but waxed/waned.  No particular trigger, not associated with position or exertion, no associated symptoms. 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  Since then he  has not had any finger tip numbness or otherwise.  He has a slight cramp 1-2x day that lasts seconds and no associated symptoms, again, no trigger, not positional or exertional  He clarifies his amiodarone dose.  When Dr. Lovena Le thought he was reducing his amiodarone 434m BID to 400AM and 300PM He was only taking 2054mBID already and decreased from 20047mdaily to 200m54m and 100mg55mwhich he has been on since Aug.   Device information BSCi dual chamber ICD (is a CRT device with LV port plugged), RA lead is from implanted 01/02/2004, RV lead and generator 04/07/18 He has an abandoned RV  Lead, visually RA lead is high up with no slack AAD Sotalol >> recurrent VT stopped 12/2017 Amiodarone 12/2017 >> current  (? If h/o thyroid issues in the past)  Mexiletine is current   Past Medical History:  Diagnosis Date   Anxiety    CAD (coronary artery disease)    Diabetes mellitus type II, controlled (HCC) [place] failure, systolic, acute on chronic (HCC) [place] (im[place]able cardiac defibrillator), single, in situ    ISCHEMIC CARDIOMYOPATHY 03/19/2009   Qualifier: Diagnosis of  By: SwindBoyce MediciBSN, Ana     LEFT VENTRICULAR MURAL THROMBUS 02/18/2009   Qualifier: Diagnosis of  By: BednaStevie Kern, Michelle     Melanoma (HCC)[facility]9   MI (myocardial infarction) (HCC) [place]2004   PAROXYSMAL VENTRICULAR TACHYCARDIA 03/19/2009   Qualifier: Diagnosis of  By: SwindBoyce MediciBSN, Ana     Pneumonia 01/1239/10/2725STOLIC HEART FAILURE, ACUTE ON CHRONIC 01/02/2009   Qualifier: Diagnosis of  By: BednaStevie Kern, Michelle     Ventricular tachycardia (HCC)[facility] Past Surgical History:  Procedure Laterality Date   ABLATION  03/2018   IN [place]   AXILLARY LYMPH NODE DISSECTION Right 10/05/2018   RIGHT AXILLARY LYMPH NODE DISSECTION    AXILLARY LYMPH NODE DISSECTION Right 10/05/2018   Procedure: RIGHT AXILLARY LYMPH NODE DISSECTION;  Surgeon: ByerlStark Klein  [facility];  Laterality: Right;   CARDIAC CATHETERIZATION     [facility]. Patent LCX and RCA. Normal LVEDP 14 mmHg. Medical managment.   CATARACT EXTRACTION     INSERT / REPLACE / REMOVE PACEMAKER     SKIN BIOPSY  2019   melanoma R shoulder   TONSILLECTOMY AND ADENOIDECTOMY  1969    Current Outpatient Medications  Medication Sig  Dispense Refill   acyclovir (ZOVIRAX) 800 MG tablet Take 400 mg by mouth once a week.      amiodarone (PACERONE) 200 MG tablet Take 1 tablet (200 mg total) by mouth daily. 90 tablet 3   aspirin 81 MG tablet Take 81 mg by mouth daily.       blood glucose meter kit and supplies Dispense based on patient and insurance preference. Use up to four times daily as directed. (FOR ICD-10 E10.9, E11.9). 1 each 0   carvedilol (COREG) 3.125 MG tablet Take 3.125 mg by mouth 2 (two) times daily.     empagliflozin (JARDIANCE) 10 MG TABS tablet Take 10 mg by mouth daily. 90 tablet 3   ipratropium (ATROVENT) 0.06 % nasal spray USE 2 SPRAYS INTO BOTH NOSTRILS 3 (THREE) TIMES DAILY. 60 mL 1   metFORMIN (GLUCOPHAGE-XR) 500 MG 24 hr tablet TAKE 2 TABLETS (1,000 MG TOTAL) BY MOUTH AT BEDTIME. 180 tablet 0   mexiletine (MEXITIL) 150 MG capsule Take 1 capsule (150  mg total) by mouth 3 (three) times daily. 270 capsule 3   rosuvastatin (CRESTOR) 10 MG tablet Take 1 tablet (10 mg total) by mouth daily. 90 tablet 3   sacubitril-valsartan (ENTRESTO) 24-26 MG Take 1 tablet by mouth 2 (two) times daily. 180 tablet 3   No current facility-administered medications for this visit.     Allergies:   Patient has no known allergies.   Social History:  The patient  reports that he quit smoking about 15 years ago. His smoking use included cigars and cigarettes. He has a 20.00 pack-year smoking history. He has never used smokeless tobacco. He reports current alcohol use. He reports that he does not use drugs.   Family History:  The patient's family history includes Alcohol abuse in his father; Alzheimer's disease in his maternal grandmother and mother; Diabetes in his father; Kidney Stones in his father.  ROS:  Please see the history of present illness.    All other systems are reviewed and otherwise negative.   PHYSICAL EXAM:  VS:  BP 108/62    Pulse (!) 56    Ht '5\' 10"'  (1.778 m)    Wt 169 lb (76.7 kg)    BMI 24.25  kg/m  BMI: Body mass index is 24.25 kg/m. Well nourished, well developed, in no acute distress  HEENT: normocephalic, atraumatic  Neck: no JVD, carotid bruits or masses Cardiac:  RRR; no significant murmurs, no rubs, or gallops Lungs:  CTA b/l, no wheezing, rhonchi or rales  Abd: soft, nontender MS: no deformity or atrophy Ext:  no edema  Skin: warm and dry, no rash Neuro:  No gross deficits appreciated Psych: euthymic mood, full affect  ICD site is stable, no tethering or discomfort   EKG:  A paced, V sensed, very wide LBBB (113m) ICD interrogation done today and reviewed by myself:  Battery and lead measurements are good No arrhythmias, no therapies   Echo 02/01/19 1. The left ventricle has severely reduced systolic function, with an ejection fraction of 20-25%. The cavity size was moderately dilated. Left ventricular diastology could not be evaluated due to nondiagnostic images. Elevated left atrial and left  ventricular end-diastolic pressures. 2. There is akinesis of the mid to apical inferior, anterior, lateral, inferolateral, apical and apical septal walls. There is akinesis of the mid anteroseptal and inferoseptal wall. 3. The right ventricle has normal systolic function. The cavity was normal. There is no increase in right ventricular wall thickness. Right ventricular systolic pressure is moderately elevated with an estimated pressure of 55.4 mmHg. 4. Left atrial size was severely dilated. 5. The mitral valve is normal in structure. There is mild mitral annular calcification present. Mitral valve regurgitation is moderate to severe by color flow Doppler. 6. The tricuspid valve is normal in structure. Tricuspid valve regurgitation is mild-moderate. 7. The aortic valve is tricuspid Moderate thickening of the aortic valve Moderate calcification of the aortic valve. Aortic valve regurgitation is moderate by color flow Doppler. 8. The pulmonic valve was normal in  structure. 9. The inferior vena cava was dilated in size with <50% respiratory variability  CKraemer 01/30/18 Findings: 1.Left main: This was a large vessel, which bifurcated into the LAD and left circumflex. There was no high-grade stenosis seen on angiography. 2. Left circumflex: This was a moderate-sized vessel, which transversethrough the AV groove as a moderate-sized vessel and then discontinued. There was no high-grade stenosis seen on angiography. 3. Left anterior descending artery: This artery was completely occluded  at the ostium. 4. Right coronary artery: This was a large vessel, which was dominant and gave rise to both PDA and the PLB. There was no high-grade stenosis seen on angiography. Recommendations: 1. Occluded left anterior descending artery 2. Patent left circumflex and RCA 3. Normal LVEDP at 14 mmHg 4. Continue aggressive medical management  TTE: 12/09  SUMMARY - The left ventricle was mildly dilated. Overall left ventricular    systolic function was markedly decreased. Left ventricular    ejection fraction was estimated , range being 25 % to 35 %.    There was aneurysmal deformity of the entire periapical wall.    There was akinesis of the mid-distal septal wall. There was    akinesis of the mid-distal inferior wall. - Aortic valve thickness was mildly increased. There was mild    aortic valvular regurgitation. - There was mild mitral annular calcification. There was mild    mitral valvular regurgitation. - The left atrium was mildly dilated. - There was the appearance of a catheter or pacing wire in the    right ventricle.     Recent Labs: 01/19/2019: Brain Natriuretic Peptide 782 02/13/2019: Magnesium 2.2 02/22/2019: NT-Pro BNP 2,169 09/26/2019: BUN 25; Creatinine, Ser 1.43; Hemoglobin 14.4; Platelets 171; Potassium 5.4; Sodium 139 10/03/2019: ALT 113; TSH 0.016  06/05/2019: Cholesterol  109; HDL 35.60; LDL Cholesterol 43; Total CHOL/HDL Ratio 3; Triglycerides 154.0; VLDL 30.8   Estimated Creatinine Clearance: 52.5 mL/min (A) (by C-G formula based on SCr of 1.43 mg/dL (H)).   Wt Readings from Last 3 Encounters:  10/03/19 169 lb (76.7 kg)  09/26/19 166 lb (75.3 kg)  07/20/19 176 lb (79.8 kg)     Other studies reviewed: Additional studies/records reviewed today include: summarized above  ASSESSMENT AND PLAN:  1. ICD     Intact function, no programming changes made  2. VT     On mexiletine and amiodarone     He has had issues with thyroid toxicity in the past with amio     Has not had VT since last visit, will reduce to 215m daily on his amiodarone      Continue same mexiletine   3. ICM 4. Chronic CHF (systolic)     No symptoms or exam findings of volume OL     Continue Coreg, entresto     He reports BP getting to 100's-110's, worries these may be too low     For now, would keep same meds, follow BP, symptoms  5. CP 6. CAD     Only had one HS trop at the ER was 5     Somewhat atypical sounding     No clear change in his EKG     Known occl LAD (presumably chronically at the time of his cath march 2019)     The patient very concerned about the symptom, will plan for lexiscan stress test     Disposition: F/u with Q 380moemotes, TSH, LFTs today, he has visit with Dr. TaLovena Len place, will keep this.    Current medicines are reviewed at length with the patient today.  The patient did not have any concerns regarding medicines.  SiVenetia NightPA-C 10/03/2019 8:08 PM     [place] [phone])  ([phone])

## 2019-10-02 NOTE — Telephone Encounter (Signed)
I called the Lee Hall and scheduled a lab appt for 11/5, advised he arrive at 9:30am and verified the tests that were ordered by Dr Ethlyn Gallery.  Message forwarded to the office manager per Dr Ethlyn Gallery.

## 2019-10-03 ENCOUNTER — Ambulatory Visit (INDEPENDENT_AMBULATORY_CARE_PROVIDER_SITE_OTHER): Payer: Medicare HMO | Admitting: Physician Assistant

## 2019-10-03 ENCOUNTER — Other Ambulatory Visit: Payer: Self-pay

## 2019-10-03 ENCOUNTER — Encounter: Payer: Self-pay | Admitting: *Deleted

## 2019-10-03 VITALS — BP 108/62 | HR 56 | Ht 70.0 in | Wt 169.0 lb

## 2019-10-03 DIAGNOSIS — I472 Ventricular tachycardia, unspecified: Secondary | ICD-10-CM

## 2019-10-03 DIAGNOSIS — R079 Chest pain, unspecified: Secondary | ICD-10-CM | POA: Diagnosis not present

## 2019-10-03 DIAGNOSIS — I255 Ischemic cardiomyopathy: Secondary | ICD-10-CM

## 2019-10-03 DIAGNOSIS — I2510 Atherosclerotic heart disease of native coronary artery without angina pectoris: Secondary | ICD-10-CM

## 2019-10-03 DIAGNOSIS — I5022 Chronic systolic (congestive) heart failure: Secondary | ICD-10-CM

## 2019-10-03 DIAGNOSIS — I4891 Unspecified atrial fibrillation: Secondary | ICD-10-CM

## 2019-10-03 DIAGNOSIS — Z79899 Other long term (current) drug therapy: Secondary | ICD-10-CM

## 2019-10-03 LAB — HEPATIC FUNCTION PANEL
ALT: 113 IU/L — ABNORMAL HIGH (ref 0–44)
AST: 64 IU/L — ABNORMAL HIGH (ref 0–40)
Albumin: 4.4 g/dL (ref 3.8–4.8)
Alkaline Phosphatase: 83 IU/L (ref 39–117)
Bilirubin Total: 0.5 mg/dL (ref 0.0–1.2)
Bilirubin, Direct: 0.16 mg/dL (ref 0.00–0.40)
Total Protein: 6.6 g/dL (ref 6.0–8.5)

## 2019-10-03 LAB — TSH: TSH: 0.016 u[IU]/mL — ABNORMAL LOW (ref 0.450–4.500)

## 2019-10-03 MED ORDER — AMIODARONE HCL 200 MG PO TABS: 200.0000 mg | ORAL_TABLET | Freq: Every day | ORAL | 3 refills | Status: DC

## 2019-10-03 NOTE — Patient Instructions (Signed)
Medication Instructions:    START  TAKING  AMIODARONE 200 MG ONCE A DAY   *If you need a refill on your cardiac medications before your next appointment, please call your pharmacy*  Lab Work: . TSH AND LFT TODAY   If you have labs (blood work) drawn today and your tests are completely normal, you will receive your results only by: Marland Kitchen MyChart Message (if you have MyChart) OR . A paper copy in the mail If you have any lab test that is abnormal or we need to change your treatment, we will call you to review the results.  Testing/Procedures:  [place] Your physician has requested that you have a lexiscan myoview. For further information please visit [email or web address]. Please follow instruction sheet, as given.   Follow-Up:  AS SCHEDULED  At [facility], you and your health needs are our priority.  As part of our continuing mission to provide you with exceptional heart care, we have created designated Provider Care Teams.  These Care Teams include your primary Cardiologist (physician) and Advanced Practice Providers (APPs -  Physician Assistants and Nurse Practitioners) who all work together to provide you with the care you need, when you need it.    Other Instructions

## 2019-10-04 ENCOUNTER — Other Ambulatory Visit (INDEPENDENT_AMBULATORY_CARE_PROVIDER_SITE_OTHER): Payer: Medicare HMO

## 2019-10-04 DIAGNOSIS — Z1159 Encounter for screening for other viral diseases: Secondary | ICD-10-CM | POA: Diagnosis not present

## 2019-10-04 DIAGNOSIS — Z20828 Contact with and (suspected) exposure to other viral communicable diseases: Secondary | ICD-10-CM

## 2019-10-04 DIAGNOSIS — E1165 Type 2 diabetes mellitus with hyperglycemia: Secondary | ICD-10-CM | POA: Diagnosis not present

## 2019-10-04 LAB — HEMOGLOBIN A1C: Hgb A1c MFr Bld: 6.5 % (ref 4.6–6.5)

## 2019-10-04 NOTE — Addendum Note (Signed)
Addended by: Claude Manges on: 10/04/2019 04:30 PM   Modules accepted: Orders

## 2019-10-05 ENCOUNTER — Telehealth: Payer: Self-pay

## 2019-10-05 DIAGNOSIS — E0590 Thyrotoxicosis, unspecified without thyrotoxic crisis or storm: Secondary | ICD-10-CM

## 2019-10-05 LAB — HEPATITIS C ANTIBODY
Hepatitis C Ab: NONREACTIVE
SIGNAL TO CUT-OFF: 0.01 (ref <1.00)

## 2019-10-05 LAB — HSV(HERPES SIMPLEX VRS) I + II AB-IGG
HAV 1 IGG,TYPE SPECIFIC AB: <0.9 index
HSV 2 IGG,TYPE SPECIFIC AB: 6.28 index — ABNORMAL HIGH

## 2019-10-05 NOTE — Telephone Encounter (Signed)
-----   Message from Evans Lance, MD sent at 10/04/2019 12:06 PM EST ----- TSH is very low. Please refer to Dr. Buddy Duty for initiation of methimazole. I would like to continue his amiodarone if at all possible.  GT

## 2019-10-05 NOTE — Telephone Encounter (Signed)
Referral placed. Pt notified via [place].  F/u for Pt already scheduled.

## 2019-10-08 ENCOUNTER — Telehealth (HOSPITAL_COMMUNITY): Payer: Self-pay | Admitting: *Deleted

## 2019-10-08 DIAGNOSIS — E0590 Thyrotoxicosis, unspecified without thyrotoxic crisis or storm: Secondary | ICD-10-CM

## 2019-10-08 NOTE — Telephone Encounter (Signed)
Patient given detailed instructions per Myocardial Perfusion Study Information Sheet for the test on 10/10/19. Patient notified to arrive 15 minutes early and that it is imperative to arrive on time for appointment to keep from having the test rescheduled.  If you need to cancel or reschedule your appointment, please call the office within 24 hours of your appointment. . Patient verbalized understanding. Kirstie Peri

## 2019-10-09 ENCOUNTER — Encounter: Payer: Self-pay | Admitting: Family Medicine

## 2019-10-10 ENCOUNTER — Other Ambulatory Visit: Payer: Self-pay

## 2019-10-10 ENCOUNTER — Ambulatory Visit (HOSPITAL_COMMUNITY): Payer: Medicare HMO | Attending: Cardiology

## 2019-10-10 DIAGNOSIS — I5022 Chronic systolic (congestive) heart failure: Secondary | ICD-10-CM | POA: Diagnosis not present

## 2019-10-10 LAB — MYOCARDIAL PERFUSION IMAGING
LV dias vol: 383 mL (ref 62–150)
LV sys vol: 303 mL
Peak HR: 76 {beats}/min
Rest HR: 60 {beats}/min
SDS: 3
SRS: 33
SSS: 37
TID: 1.06

## 2019-10-10 MED ORDER — TECHNETIUM TC 99M TETROFOSMIN IV KIT
10.8000 | PACK | Freq: Once | INTRAVENOUS | Status: AC | PRN
  Administered 2019-10-10: 10.8 via INTRAVENOUS
  Filled 2019-10-10: qty 11

## 2019-10-10 MED ORDER — REGADENOSON 0.4 MG/5ML IV SOLN
0.4000 mg | Freq: Once | INTRAVENOUS | Status: AC
  Administered 2019-10-10: 0.4 mg via INTRAVENOUS

## 2019-10-10 MED ORDER — TECHNETIUM TC 99M TETROFOSMIN IV KIT
31.9000 | PACK | Freq: Once | INTRAVENOUS | Status: AC | PRN
  Administered 2019-10-10: 31.9 via INTRAVENOUS
  Filled 2019-10-10: qty 32

## 2019-10-17 ENCOUNTER — Encounter: Payer: Medicare HMO | Admitting: Internal Medicine

## 2019-10-24 ENCOUNTER — Ambulatory Visit (INDEPENDENT_AMBULATORY_CARE_PROVIDER_SITE_OTHER): Payer: Medicare HMO

## 2019-10-24 ENCOUNTER — Ambulatory Visit (INDEPENDENT_AMBULATORY_CARE_PROVIDER_SITE_OTHER): Payer: Medicare HMO | Admitting: *Deleted

## 2019-10-24 DIAGNOSIS — I5022 Chronic systolic (congestive) heart failure: Secondary | ICD-10-CM

## 2019-10-24 DIAGNOSIS — Z95810 Presence of automatic (implantable) cardiac defibrillator: Secondary | ICD-10-CM

## 2019-10-24 LAB — CUP PACEART REMOTE DEVICE CHECK
Battery Remaining Longevity: 132 mo
Battery Remaining Percentage: 100 %
Brady Statistic RA Percent Paced: 58 %
Brady Statistic RV Percent Paced: 0 %
Date Time Interrogation Session: 20201125050200
HighPow Impedance: 51 Ohm
Implantable Lead Implant Date: 20050203
Implantable Lead Implant Date: 20190510
Implantable Lead Location: 753859
Implantable Lead Location: 753860
Implantable Lead Model: 5076
Implantable Lead Model: 675
Implantable Lead Serial Number: 500562
Implantable Pulse Generator Implant Date: 20190510
Lead Channel Impedance Value: 369 Ohm
Lead Channel Impedance Value: 565 Ohm
Lead Channel Pacing Threshold Amplitude: 0.7 V
Lead Channel Pacing Threshold Amplitude: 1.3 V
Lead Channel Pacing Threshold Pulse Width: 0.4 ms
Lead Channel Pacing Threshold Pulse Width: 0.4 ms
Lead Channel Setting Pacing Amplitude: 0.1 V
Lead Channel Setting Pacing Amplitude: 2 V
Lead Channel Setting Pacing Amplitude: 3 V
Lead Channel Setting Pacing Pulse Width: 0.1 ms
Lead Channel Setting Pacing Pulse Width: 0.4 ms
Lead Channel Setting Sensing Sensitivity: 0.6 mV
Lead Channel Setting Sensing Sensitivity: 1 mV
Pulse Gen Serial Number: 492955

## 2019-10-24 NOTE — Progress Notes (Signed)
EPIC Encounter for ICM Monitoring  Patient Name: Lee Hall is a 68 y.o. male Date: 10/24/2019 Primary Care Physican: [name], MD Primary Cardiologist:Taylor Electrophysiologist:Taylor Weight: unknown   Transmission reviewed and results sent via mychart.  HeartLogic Heart Failure Index0 which is within normal threshold   Prescribed:No diuretic  Recommendations: None  Follow-up plan: ICM clinic phone appointment on 12/03/2019.   91 day device clinic remote transmission 01/23/2020.  Office appt 10/29/2019 with Dr. Lovena Le.    Copy of ICM check sent to Dr. Lovena Le.   3 month ICM trend: 10/24/2019      Rosalene Billings, RN 10/24/2019 8:41 AM

## 2019-10-27 ENCOUNTER — Encounter: Payer: Self-pay | Admitting: Family Medicine

## 2019-10-29 ENCOUNTER — Encounter: Payer: Self-pay | Admitting: Internal Medicine

## 2019-10-29 ENCOUNTER — Ambulatory Visit (INDEPENDENT_AMBULATORY_CARE_PROVIDER_SITE_OTHER): Payer: Medicare HMO | Admitting: Internal Medicine

## 2019-10-29 ENCOUNTER — Other Ambulatory Visit: Payer: Self-pay

## 2019-10-29 ENCOUNTER — Other Ambulatory Visit: Payer: Self-pay | Admitting: Family Medicine

## 2019-10-29 VITALS — BP 110/60 | HR 71 | Ht 70.0 in | Wt 166.8 lb

## 2019-10-29 DIAGNOSIS — I472 Ventricular tachycardia, unspecified: Secondary | ICD-10-CM

## 2019-10-29 DIAGNOSIS — Z95810 Presence of automatic (implantable) cardiac defibrillator: Secondary | ICD-10-CM | POA: Diagnosis not present

## 2019-10-29 DIAGNOSIS — I428 Other cardiomyopathies: Secondary | ICD-10-CM

## 2019-10-29 DIAGNOSIS — Z79899 Other long term (current) drug therapy: Secondary | ICD-10-CM | POA: Diagnosis not present

## 2019-10-29 DIAGNOSIS — I5022 Chronic systolic (congestive) heart failure: Secondary | ICD-10-CM

## 2019-10-29 MED ORDER — ACYCLOVIR 800 MG PO TABS: 800.0000 mg | ORAL_TABLET | Freq: Three times a day (TID) | ORAL | 1 refills | Status: DC

## 2019-10-29 NOTE — Patient Instructions (Addendum)
Medication Instructions:  Your physician recommends that you continue on your current medications as directed. Please refer to the Current Medication list given to you today.  Labwork: None ordered.  Testing/Procedures: None ordered.  Follow-Up: Your physician wants you to follow-up in: February with Dr. Lovena Le.     December 31, 2019 at 9:45 am  Remote monitoring is used to monitor your ICD from home. This monitoring reduces the number of office visits required to check your device to one time per year. It allows [place] to keep an eye on the functioning of your device to ensure it is working properly. You are scheduled for a device check from home on 12/03/2019. You may send your transmission at any time that day. If you have a wireless device, the transmission will be sent automatically. After your physician reviews your transmission, you will receive a postcard with your next transmission date.  Any Other Special Instructions Will Be Listed Below (If Applicable).  If you need a refill on your cardiac medications before your next appointment, please call your pharmacy.

## 2019-10-29 NOTE — Progress Notes (Signed)
HPI Mr. Berlinger returns today for followup. He is a pleasant 68 yo man with an ICM, chronic systolic heart failure, s/p MI, EF 25%, who was hospitalized almost 2 weeks with pneumonia. He has been slow to improve. He has thyroid dysfunction. He has had his dose of amiodarone gradually reduced. He has LBBB and attempted biv as well as VT ablation, both unsuccessful. He does not have a cough. No Known Allergies   Current Outpatient Medications  Medication Sig Dispense Refill  . acyclovir (ZOVIRAX) 800 MG tablet Take 1 tablet (800 mg total) by mouth 3 (three) times daily. Use for 4-6 days at onset of symptoms 90 tablet 1  . amiodarone (PACERONE) 200 MG tablet Take 1 tablet (200 mg total) by mouth daily. 90 tablet 3  . aspirin 81 MG tablet Take 81 mg by mouth daily.      . blood glucose meter kit and supplies Dispense based on patient and insurance preference. Use up to four times daily as directed. (FOR ICD-10 E10.9, E11.9). 1 each 0  . carvedilol (COREG) 3.125 MG tablet Take 3.125 mg by mouth 2 (two) times daily.    . empagliflozin (JARDIANCE) 10 MG TABS tablet Take 10 mg by mouth daily. 90 tablet 3  . ipratropium (ATROVENT) 0.06 % nasal spray USE 2 SPRAYS INTO BOTH NOSTRILS 3 (THREE) TIMES DAILY. 60 mL 1  . metFORMIN (GLUCOPHAGE-XR) 500 MG 24 hr tablet TAKE 2 TABLETS (1,000 MG TOTAL) BY MOUTH AT BEDTIME. 180 tablet 0  . mexiletine (MEXITIL) 150 MG capsule Take 1 capsule (150 mg total) by mouth 3 (three) times daily. 270 capsule 3  . sacubitril-valsartan (ENTRESTO) 24-26 MG Take 1 tablet by mouth 2 (two) times daily. 180 tablet 3  . rosuvastatin (CRESTOR) 10 MG tablet Take 1 tablet (10 mg total) by mouth daily. 90 tablet 3   No current facility-administered medications for this visit.      Past Medical History:  Diagnosis Date  . Anxiety   . CAD (coronary artery disease)   . Diabetes mellitus type II, controlled ([place])   . Heart failure, systolic, acute on chronic ([place])   . ICD  (implantable cardiac defibrillator), single, in situ   . ISCHEMIC CARDIOMYOPATHY 03/19/2009   Qualifier: Diagnosis of  By: Boyce Medici RN, BSN, [place]    . LEFT VENTRICULAR MURAL THROMBUS 02/18/2009   Qualifier: Diagnosis of  By: Stevie Kern NP-C, Sharyn Lull    . Melanoma ([place]) 2019  . MI (myocardial infarction) ([place]) 08/2003  . PAROXYSMAL VENTRICULAR TACHYCARDIA 03/19/2009   Qualifier: Diagnosis of  By: Boyce Medici RN, BSN, [place]    . Pneumonia 02/08/2019  . SYSTOLIC HEART FAILURE, ACUTE ON CHRONIC 01/02/2009   Qualifier: Diagnosis of  By: Stevie Kern NP-C, Sharyn Lull    . Ventricular tachycardia ([place])     ROS:   All systems reviewed and negative except as noted in the HPI.   Past Surgical History:  Procedure Laterality Date  . ABLATION  03/2018   IN [place]  . AXILLARY LYMPH NODE DISSECTION Right 10/05/2018   RIGHT AXILLARY LYMPH NODE DISSECTION   . AXILLARY LYMPH NODE DISSECTION Right 10/05/2018   Procedure: RIGHT AXILLARY LYMPH NODE DISSECTION;  Surgeon: Stark Klein, MD;  Location: [place];  Service: General;  Laterality: Right;  . CARDIAC CATHETERIZATION     01/30/18 [facility]): Occluded LAD. Patent LCX and RCA. Normal LVEDP 14 mmHg. Medical managment.  Marland Kitchen CATARACT EXTRACTION    . INSERT / REPLACE / REMOVE PACEMAKER    .  SKIN BIOPSY  2019   melanoma R shoulder  . TONSILLECTOMY AND ADENOIDECTOMY  1969     Family History  Problem Relation Age of Onset  . Alzheimer's disease Mother   . Kidney Stones Father   . Diabetes Father   . Alcohol abuse Father   . Alzheimer's disease Maternal Grandmother      Social History   Socioeconomic History  . Marital status: Single    Spouse name: Not on file  . Number of children: Not on file  . Years of education: Not on file  . Highest education level: Not on file  Occupational History  . Occupation: Unemployed since 10/2008  Social Needs  . Financial resource strain: Not on file  . Food insecurity    Worry: Not on file    Inability: Not on  file  . Transportation needs    Medical: Not on file    Non-medical: Not on file  Tobacco Use  . Smoking status: Former Smoker    Packs/day: 1.00    Years: 20.00    Pack years: 20.00    Types: Cigars, Cigarettes    Quit date: 02/22/2004    Years since quitting: 15.6  . Smokeless tobacco: Never Used  Substance and Sexual Activity  . Alcohol use: Yes    Comment: weekly  . Drug use: No  . Sexual activity: Not on file  Lifestyle  . Physical activity    Days per week: Not on file    Minutes per session: Not on file  . Stress: Not on file  Relationships  . Social Herbalist on phone: Not on file    Gets together: Not on file    Attends religious service: Not on file    Active member of club or organization: Not on file    Attends meetings of clubs or organizations: Not on file    Relationship status: Not on file  . Intimate partner violence    Fear of current or ex partner: Not on file    Emotionally abused: Not on file    Physically abused: Not on file    Forced sexual activity: Not on file  Other Topics Concern  . Not on file  Social History Narrative   Single   Lives with sister   No regular exercise     BP 110/60   Pulse 71   Ht _0  (1.778 m)   Wt 166 lb 12.8 oz (75.7 kg)   SpO2 97%   BMI 23.93 kg/m   Physical Exam:  Well appearing NAD HEENT: Unremarkable Neck:  No JVD, no thyromegally Lymphatics:  No adenopathy Back:  No CVA tenderness Lungs:  Clear with no wheezes HEART:  Regular rate rhythm, no murmurs, no rubs, no clicks Abd:  soft, positive bowel sounds, no organomegally, no rebound, no guarding Ext:  2 plus pulses, no edema, no cyanosis, no clubbing Skin:  No rashes no nodules Neuro:  CN II through XII intact, motor grossly intact  EKG NSR with LBBB  DEVICE  Normal device function.  See PaceArt for details.    Assess/Plan: 1. Chronic systolic heart failure - his symptoms are class 2. He will continue his current meds. 2. ICM  - he has undergone a stress test which shows a large scar from his prior anterior MI. He does not have angina. 3. ICD -his [place] [employer] DDD ICD is working normally. We will recheck in several months. 4. VT - he is  maintaining NSR on decreasing doses of amiodarone. He will continue 200 mg daily. He will undergo PFT's with a DLCO. 5. Hyperthyroid - he is pending eval. His TSH was very low.   Lee Hall.D.

## 2019-10-30 ENCOUNTER — Other Ambulatory Visit: Payer: Self-pay

## 2019-11-01 ENCOUNTER — Encounter: Payer: Self-pay | Admitting: Endocrinology

## 2019-11-01 ENCOUNTER — Ambulatory Visit (INDEPENDENT_AMBULATORY_CARE_PROVIDER_SITE_OTHER): Payer: Medicare HMO | Admitting: Endocrinology

## 2019-11-01 ENCOUNTER — Telehealth: Payer: Self-pay

## 2019-11-01 VITALS — BP 122/60 | HR 72 | Ht 70.0 in | Wt 166.6 lb

## 2019-11-01 DIAGNOSIS — I4891 Unspecified atrial fibrillation: Secondary | ICD-10-CM

## 2019-11-01 DIAGNOSIS — E0590 Thyrotoxicosis, unspecified without thyrotoxic crisis or storm: Secondary | ICD-10-CM

## 2019-11-01 MED ORDER — METHIMAZOLE 10 MG PO TABS: 10.0000 mg | ORAL_TABLET | Freq: Two times a day (BID) | ORAL | 5 refills | Status: DC

## 2019-11-01 NOTE — Progress Notes (Signed)
Subjective:    Patient ID: Lee Hall, male    DOB: August 06, 1951, 68 y.o.   MRN: [id number]  HPI Pt is referred by Dr Lovena Le, for hyperthyroidism.  Pt reports he took amiodarone for a brief time in 2005 and again in 2011.  He has been back on it consistently since 2019.  He was dx'ed with hyperthyroidism in 2011, when he was on amiodarone.  It resolved spontaneously (but pt says a course of prednisone might have normalized TFT, also).  He has never been on thyroid medication.  He has never had XRT to the anterior neck, or thyroid surgery.  He has never had thyroid imaging.  He does not consume kelp or any other non-prescribed thyroid medication.  He reports slight palpitations in the chest, and assoc tremor.   Past Medical History:  Diagnosis Date  . Anxiety   . CAD (coronary artery disease)   . Diabetes mellitus type II, controlled ([place])   . Heart failure, systolic, acute on chronic ([place])   . ICD (implantable cardiac defibrillator), single, in situ   . ISCHEMIC CARDIOMYOPATHY 03/19/2009   Qualifier: Diagnosis of  By: Boyce Medici RN, BSN, [place]    . LEFT VENTRICULAR MURAL THROMBUS 02/18/2009   Qualifier: Diagnosis of  By: Stevie Kern NP-C, Sharyn Lull    . Melanoma ([place]) 2019  . MI (myocardial infarction) ([place]) 08/2003  . PAROXYSMAL VENTRICULAR TACHYCARDIA 03/19/2009   Qualifier: Diagnosis of  By: Boyce Medici RN, BSN, [place]    . Pneumonia 02/08/2019  . SYSTOLIC HEART FAILURE, ACUTE ON CHRONIC 01/02/2009   Qualifier: Diagnosis of  By: Stevie Kern NP-C, Sharyn Lull    . Ventricular tachycardia [facility])     Past Surgical History:  Procedure Laterality Date  . ABLATION  03/2018   IN [place]  . AXILLARY LYMPH NODE DISSECTION Right 10/05/2018   RIGHT AXILLARY LYMPH NODE DISSECTION   . AXILLARY LYMPH NODE DISSECTION Right 10/05/2018   Procedure: RIGHT AXILLARY LYMPH NODE DISSECTION;  Surgeon: Stark Klein, MD;  Location: [facility];  Service: General;  Laterality: Right;  . CARDIAC CATHETERIZATION     01/30/18  [facility]): Occluded LAD. Patent LCX and RCA. Normal LVEDP 14 mmHg. Medical managment.  Marland Kitchen CATARACT EXTRACTION    . INSERT / REPLACE / REMOVE PACEMAKER    . SKIN BIOPSY  2019   melanoma R shoulder  . TONSILLECTOMY AND ADENOIDECTOMY  1969    Social History   Socioeconomic History  . Marital status: Single    Spouse name: Not on file  . Number of children: Not on file  . Years of education: Not on file  . Highest education level: Not on file  Occupational History  . Occupation: Unemployed since 10/2008  Social Needs  . Financial resource strain: Not on file  . Food insecurity    Worry: Not on file    Inability: Not on file  . Transportation needs    Medical: Not on file    Non-medical: Not on file  Tobacco Use  . Smoking status: Former Smoker    Packs/day: 1.00    Years: 20.00    Pack years: 20.00    Types: Cigars, Cigarettes    Quit date: 02/22/2004    Years since quitting: 15.7  . Smokeless tobacco: Never Used  Substance and Sexual Activity  . Alcohol use: Yes    Comment: weekly  . Drug use: No  . Sexual activity: Not on file  Lifestyle  . Physical activity    Days per week:  Not on file    Minutes per session: Not on file  . Stress: Not on file  Relationships  . Social Herbalist on phone: Not on file    Gets together: Not on file    Attends religious service: Not on file    Active member of club or organization: Not on file    Attends meetings of clubs or organizations: Not on file    Relationship status: Not on file  . Intimate partner violence    Fear of current or ex partner: Not on file    Emotionally abused: Not on file    Physically abused: Not on file    Forced sexual activity: Not on file  Other Topics Concern  . Not on file  Social History Narrative   Single   Lives with sister   No regular exercise    Current Outpatient Medications on File Prior to Visit  Medication Sig Dispense Refill  . acyclovir (ZOVIRAX) 800 MG tablet  Take 1 tablet (800 mg total) by mouth 3 (three) times daily. Use for 4-6 days at onset of symptoms 90 tablet 1  . amiodarone (PACERONE) 200 MG tablet Take 1 tablet (200 mg total) by mouth daily. 90 tablet 3  . aspirin 81 MG tablet Take 81 mg by mouth daily.      . blood glucose meter kit and supplies Dispense based on patient and insurance preference. Use up to four times daily as directed. (FOR ICD-10 E10.9, E11.9). 1 each 0  . carvedilol (COREG) 3.125 MG tablet Take 3.125 mg by mouth 2 (two) times daily.    . empagliflozin (JARDIANCE) 10 MG TABS tablet Take 10 mg by mouth daily. 90 tablet 3  . ipratropium (ATROVENT) 0.06 % nasal spray USE 2 SPRAYS INTO BOTH NOSTRILS 3 (THREE) TIMES DAILY. 60 mL 1  . metFORMIN (GLUCOPHAGE-XR) 500 MG 24 hr tablet TAKE 2 TABLETS (1,000 MG TOTAL) BY MOUTH AT BEDTIME. 180 tablet 0  . mexiletine (MEXITIL) 150 MG capsule Take 1 capsule (150 mg total) by mouth 3 (three) times daily. 270 capsule 3  . sacubitril-valsartan (ENTRESTO) 24-26 MG Take 1 tablet by mouth 2 (two) times daily. 180 tablet 3  . rosuvastatin (CRESTOR) 10 MG tablet Take 1 tablet (10 mg total) by mouth daily. 90 tablet 3   No current facility-administered medications on file prior to visit.     No Known Allergies  Family History  Problem Relation Age of Onset  . Alzheimer's disease Mother   . Thyroid disease Mother        uncertain type  . Kidney Stones Father   . Diabetes Father   . Alcohol abuse Father   . Alzheimer's disease Maternal Grandmother     BP 122/60 (BP Location: Right Arm, Patient Position: Sitting, Cuff Size: Normal)   Pulse 72   Ht '5\' 10"'  (1.778 m)   Wt 166 lb 9.6 oz (75.6 kg)   SpO2 98%   BMI 23.90 kg/m    Review of Systems denies headache, hoarseness, visual loss, sob, diarrhea, polyuria, muscle weakness, edema, excessive diaphoresis, anxiety, heat intolerance, and rhinorrhea.  He has lost 30 lbs x 9 mos. He has easy bruising.       Objective:   Physical Exam  VS: see vs page GEN: no distress HEAD: head: no deformity eyes: no periorbital swelling, no proptosis external nose and ears are normal NECK: supple, thyroid is not enlarged CHEST WALL: no deformity LUNGS: clear to auscultation  CV: reg rate and rhythm, no murmur ABD: abdomen is soft, nontender.  no hepatosplenomegaly.  not distended.  Small ventral hernia MUSCULOSKELETAL: muscle bulk and strength are grossly normal.  no obvious joint swelling.  gait is normal and steady EXTEMITIES: no deformity.  no edema PULSES: no carotid bruit NEURO:  cn 2-12 grossly intact.   readily moves all 4's.  sensation is intact to touch on all 4's.  Slight tremor of the hands SKIN:  Normal texture and temperature.  No rash or suspicious lesion is visible.  Not diaphoretic.   NODES:  None palpable at the neck.   PSYCH: alert, well-oriented.  Does not appear anxious nor depressed.     Lab Results  Component Value Date   TSH 0.016 (L) 10/03/2019   I have reviewed outside records, and summarized: Pt was noted to have suppressed TSH, and referred here.  He has ischemic CM, so he also has ICD      Assessment & Plan:  Hyperthyroidism, new to me.  uncertain etiology.  Weight loss: uncertain if thyroid-related.  We'll follow.  AF: no need to d/c amiodarone.    Patient Instructions  I have sent a prescription to your pharmacy, to slow the thyroid.   If ever you have fever while taking methimazole, stop it and call us, even if the reason is obvious, because of the risk of a rare side-effect.   It is best to never miss the medication.  However, if you do miss it, next best is to double up the next time.   There is no need to stop the amiodarone--we'll work around that.   Please come back for a follow-up appointment in 2 weeks.

## 2019-11-01 NOTE — Patient Instructions (Addendum)
I have sent a prescription to your pharmacy, to slow the thyroid.   If ever you have fever while taking methimazole, stop it and call us, even if the reason is obvious, because of the risk of a rare side-effect.   It is best to never miss the medication.  However, if you do miss it, next best is to double up the next time.   There is no need to stop the amiodarone--we'll work around that.   Please come back for a follow-up appointment in 2 weeks.

## 2019-11-01 NOTE — Telephone Encounter (Signed)
**Note De-Identified  Obfuscation** The pt emailed his [employer] pt asst application for his Delene Loll to the office. I have completed the provider part of the application and am awaiting MDs signature.

## 2019-11-02 NOTE — Telephone Encounter (Signed)
**Note De-Identified  Obfuscation** DOD, Dr Johnsie Cancel signed the provider part of the application and I faxed all to [employer]

## 2019-11-07 ENCOUNTER — Telehealth: Payer: Self-pay | Admitting: Oncology

## 2019-11-07 NOTE — Telephone Encounter (Signed)
Called patient per providers request, 12/30 will be a phone visit.

## 2019-11-08 DIAGNOSIS — M9903 Segmental and somatic dysfunction of lumbar region: Secondary | ICD-10-CM | POA: Diagnosis not present

## 2019-11-08 DIAGNOSIS — M9904 Segmental and somatic dysfunction of sacral region: Secondary | ICD-10-CM | POA: Diagnosis not present

## 2019-11-08 DIAGNOSIS — M5414 Radiculopathy, thoracic region: Secondary | ICD-10-CM | POA: Diagnosis not present

## 2019-11-08 DIAGNOSIS — M5137 Other intervertebral disc degeneration, lumbosacral region: Secondary | ICD-10-CM | POA: Diagnosis not present

## 2019-11-08 DIAGNOSIS — M9902 Segmental and somatic dysfunction of thoracic region: Secondary | ICD-10-CM | POA: Diagnosis not present

## 2019-11-08 DIAGNOSIS — M5417 Radiculopathy, lumbosacral region: Secondary | ICD-10-CM | POA: Diagnosis not present

## 2019-11-09 DIAGNOSIS — M9902 Segmental and somatic dysfunction of thoracic region: Secondary | ICD-10-CM | POA: Diagnosis not present

## 2019-11-09 DIAGNOSIS — M5414 Radiculopathy, thoracic region: Secondary | ICD-10-CM | POA: Diagnosis not present

## 2019-11-09 DIAGNOSIS — M9904 Segmental and somatic dysfunction of sacral region: Secondary | ICD-10-CM | POA: Diagnosis not present

## 2019-11-09 DIAGNOSIS — M5137 Other intervertebral disc degeneration, lumbosacral region: Secondary | ICD-10-CM | POA: Diagnosis not present

## 2019-11-09 DIAGNOSIS — M5417 Radiculopathy, lumbosacral region: Secondary | ICD-10-CM | POA: Diagnosis not present

## 2019-11-09 DIAGNOSIS — M9903 Segmental and somatic dysfunction of lumbar region: Secondary | ICD-10-CM | POA: Diagnosis not present

## 2019-11-10 ENCOUNTER — Encounter: Payer: Self-pay | Admitting: Family Medicine

## 2019-11-12 DIAGNOSIS — M9903 Segmental and somatic dysfunction of lumbar region: Secondary | ICD-10-CM | POA: Diagnosis not present

## 2019-11-12 DIAGNOSIS — M9904 Segmental and somatic dysfunction of sacral region: Secondary | ICD-10-CM | POA: Diagnosis not present

## 2019-11-12 DIAGNOSIS — M5417 Radiculopathy, lumbosacral region: Secondary | ICD-10-CM | POA: Diagnosis not present

## 2019-11-12 DIAGNOSIS — M5137 Other intervertebral disc degeneration, lumbosacral region: Secondary | ICD-10-CM | POA: Diagnosis not present

## 2019-11-12 DIAGNOSIS — M9902 Segmental and somatic dysfunction of thoracic region: Secondary | ICD-10-CM | POA: Diagnosis not present

## 2019-11-12 DIAGNOSIS — M5414 Radiculopathy, thoracic region: Secondary | ICD-10-CM | POA: Diagnosis not present

## 2019-11-13 ENCOUNTER — Telehealth (INDEPENDENT_AMBULATORY_CARE_PROVIDER_SITE_OTHER): Payer: Medicare HMO | Admitting: Family Medicine

## 2019-11-13 ENCOUNTER — Other Ambulatory Visit: Payer: Self-pay

## 2019-11-13 ENCOUNTER — Encounter: Payer: Self-pay | Admitting: Family Medicine

## 2019-11-13 VITALS — BP 115/62 | HR 62 | Wt 168.0 lb

## 2019-11-13 DIAGNOSIS — M7918 Myalgia, other site: Secondary | ICD-10-CM | POA: Diagnosis not present

## 2019-11-13 MED ORDER — TIZANIDINE HCL 2 MG PO CAPS: 2.0000 mg | ORAL_CAPSULE | Freq: Two times a day (BID) | ORAL | 0 refills | Status: DC | PRN

## 2019-11-13 NOTE — Telephone Encounter (Signed)
I really don't like treating without some further discussion/exam. I am sure he is hesitant to come into office, but wondering if he would be up for a virtual visit if there are any available? I will send to you. If he can't get in with someone today, then let me know and I can send in something for pain to get him to appointment with me, but I think that seeing him in person at that time weds (would be ok with add on at lunch) would be most beneficial. Would prefer he talk with someone today due to pain level if possible.

## 2019-11-13 NOTE — Progress Notes (Signed)
Virtual Visit via Video Note  I connected with Lee Hall  on 11/13/19 at 12:20 PM EST by a video enabled telemedicine application and verified that I am speaking with the correct person using two identifiers.  Location patient: home Location provider:work or home office Persons participating in the virtual visit: patient, provider  I discussed the limitations of evaluation and management by telemedicine and the availability of in person appointments. The patient expressed understanding and agreed to proceed.   HPI:  L buttock pain: -started about 2 weeks ago, saw chiropractor last week - Dr. Kathlen Mody (3 adjustments) - feels like symptoms improved for 10/10 to a 5/10 -sharp, toothache type of pain - denies radiation/weakness/numbness/fever today -he is requesting something for pain -better with ice and lying down -reports he was told by his Chiropractor that this is in the muscles and tendons and he wants a prescription for this; he is somewhat aggravated that he had to have an appointment for this as reports he really just wanted an Rx  ROS: See pertinent positives and negatives per HPI.  Past Medical History:  Diagnosis Date  . Anxiety   . CAD (coronary artery disease)   . Diabetes mellitus type II, controlled ([place])   . Heart failure, systolic, acute on chronic ([place])   . ICD (implantable cardiac defibrillator), single, in situ   . ISCHEMIC CARDIOMYOPATHY 03/19/2009   Qualifier: Diagnosis of  By: Boyce Medici RN, BSN, [place]    . LEFT VENTRICULAR MURAL THROMBUS 02/18/2009   Qualifier: Diagnosis of  By: Stevie Kern NP-C, Sharyn Lull    . Melanoma ([place]) 2019  . MI (myocardial infarction) ([place]) 08/2003  . PAROXYSMAL VENTRICULAR TACHYCARDIA 03/19/2009   Qualifier: Diagnosis of  By: Boyce Medici RN, BSN, [place]    . Pneumonia 02/08/2019  . SYSTOLIC HEART FAILURE, ACUTE ON CHRONIC 01/02/2009   Qualifier: Diagnosis of  By: Stevie Kern NP-C, Sharyn Lull    . Ventricular tachycardia [facility])     Past Surgical History:   Procedure Laterality Date  . ABLATION  03/2018   IN [place]  . AXILLARY LYMPH NODE DISSECTION Right 10/05/2018   RIGHT AXILLARY LYMPH NODE DISSECTION   . AXILLARY LYMPH NODE DISSECTION Right 10/05/2018   Procedure: RIGHT AXILLARY LYMPH NODE DISSECTION;  Surgeon: Stark Klein, MD;  Location: [facility];  Service: General;  Laterality: Right;  . CARDIAC CATHETERIZATION     01/30/18 [facility]): Occluded LAD. Patent LCX and RCA. Normal LVEDP 14 mmHg. Medical managment.  Marland Kitchen CATARACT EXTRACTION    . INSERT / REPLACE / REMOVE PACEMAKER    . SKIN BIOPSY  2019   melanoma R shoulder  . TONSILLECTOMY AND ADENOIDECTOMY  1969    Family History  Problem Relation Age of Onset  . Alzheimer's disease Mother   . Thyroid disease Mother        uncertain type  . Kidney Stones Father   . Diabetes Father   . Alcohol abuse Father   . Alzheimer's disease Maternal Grandmother     SOCIAL HX: see hpi   Current Outpatient Medications:  .  acyclovir (ZOVIRAX) 800 MG tablet, Take 1 tablet (800 mg total) by mouth 3 (three) times daily. Use for 4-6 days at onset of symptoms, Disp: 90 tablet, Rfl: 1 .  aspirin 81 MG tablet, Take 81 mg by mouth daily.  , Disp: , Rfl:  .  blood glucose meter kit and supplies, Dispense based on patient and insurance preference. Use up to four times daily as directed. (FOR ICD-10 E10.9, E11.9)., Disp: 1  each, Rfl: 0 .  carvedilol (COREG) 3.125 MG tablet, Take 3.125 mg by mouth 2 (two) times daily., Disp: , Rfl:  .  empagliflozin (JARDIANCE) 10 MG TABS tablet, Take 10 mg by mouth daily., Disp: 90 tablet, Rfl: 3 .  ipratropium (ATROVENT) 0.06 % nasal spray, USE 2 SPRAYS INTO BOTH NOSTRILS 3 (THREE) TIMES DAILY., Disp: 60 mL, Rfl: 1 .  metFORMIN (GLUCOPHAGE-XR) 500 MG 24 hr tablet, TAKE 2 TABLETS (1,000 MG TOTAL) BY MOUTH AT BEDTIME., Disp: 180 tablet, Rfl: 0 .  methimazole (TAPAZOLE) 10 MG tablet, Take 1 tablet (10 mg total) by mouth 2 (two) times daily., Disp: 60 tablet, Rfl: 5 .   mexiletine (MEXITIL) 150 MG capsule, Take 1 capsule (150 mg total) by mouth 3 (three) times daily., Disp: 270 capsule, Rfl: 3 .  sacubitril-valsartan (ENTRESTO) 24-26 MG, Take 1 tablet by mouth 2 (two) times daily., Disp: 180 tablet, Rfl: 3 .  amiodarone (PACERONE) 200 MG tablet, Take 1 tablet (200 mg total) by mouth daily., Disp: 90 tablet, Rfl: 3 .  rosuvastatin (CRESTOR) 10 MG tablet, Take 1 tablet (10 mg total) by mouth daily., Disp: 90 tablet, Rfl: 3 .  tizanidine (ZANAFLEX) 2 MG capsule, Take 1 capsule (2 mg total) by mouth 2 (two) times daily as needed for muscle spasms., Disp: 10 capsule, Rfl: 0  EXAM:  VITALS per patient if applicable:denies fevers  GENERAL: alert, oriented, appears well and in no acute distress  HEENT: atraumatic, conjunttiva clear, no obvious abnormalities on inspection of external nose and ears  NECK: normal movements of the head and neck  LUNGS: on inspection no signs of respiratory distress, breathing rate appears normal, no obvious gross SOB, gasping or wheezing  CV: no obvious cyanosis  MS: moves all visible extremities without noticeable abnormality, stands up fairly easily, points to L buttock as area of pain in focal area around piriformis  PSYCH/NEURO: pleasant and cooperative, no obvious depression or anxiety, speech and thought processing grossly intact  ASSESSMENT AND PLAN:  Discussed the following assessment and plan:  Buttock pain  -we discussed possible serious and likely etiologies, options for evaluation and workup, limitations of telemedicine visit vs in person visit, treatment, treatment risks and precautions. Pt prefers to treat via telemedicine empirically rather then risking or undertaking an in person visit at this moment. He wants a pill for this and feels certain it is tight muscle/tendon per chiropractic exam. Discussed could be piriformis muscle spasm, nerve or radicular pain vs other. Discussed various options for evaluation,  treatment options and risks. He opted for trying topical menthol, muscle relaxer and follow up with PCP next week. He is doing hep and ice with chiropractor. Advise Patient agrees to seek prompt in person care if worsening, new symptoms arise, or if is not improving with treatment.   I discussed the assessment and treatment plan with the patient. The patient was provided an opportunity to ask questions and all were answered. The patient agreed with the plan and demonstrated an understanding of the instructions.   The patient was advised to call back or seek an in-person evaluation if the symptoms worsen or if the condition fails to improve as anticipated.   Lucretia Kern, DO

## 2019-11-13 NOTE — Telephone Encounter (Signed)
Spoke with the pt and scheduled a virtual visit for today at 12:20pnm with Dr Maudie Mercury.

## 2019-11-14 ENCOUNTER — Other Ambulatory Visit: Payer: Self-pay | Admitting: Family Medicine

## 2019-11-14 ENCOUNTER — Other Ambulatory Visit: Payer: Medicare HMO

## 2019-11-14 DIAGNOSIS — M5414 Radiculopathy, thoracic region: Secondary | ICD-10-CM | POA: Diagnosis not present

## 2019-11-14 DIAGNOSIS — M9904 Segmental and somatic dysfunction of sacral region: Secondary | ICD-10-CM | POA: Diagnosis not present

## 2019-11-14 DIAGNOSIS — M5137 Other intervertebral disc degeneration, lumbosacral region: Secondary | ICD-10-CM | POA: Diagnosis not present

## 2019-11-14 DIAGNOSIS — M9902 Segmental and somatic dysfunction of thoracic region: Secondary | ICD-10-CM | POA: Diagnosis not present

## 2019-11-14 DIAGNOSIS — M9903 Segmental and somatic dysfunction of lumbar region: Secondary | ICD-10-CM | POA: Diagnosis not present

## 2019-11-14 DIAGNOSIS — M5417 Radiculopathy, lumbosacral region: Secondary | ICD-10-CM | POA: Diagnosis not present

## 2019-11-14 MED ORDER — TRAMADOL HCL 50 MG PO TABS: 50.0000 mg | ORAL_TABLET | Freq: Two times a day (BID) | ORAL | 0 refills | Status: DC | PRN

## 2019-11-15 ENCOUNTER — Ambulatory Visit: Payer: Medicare HMO | Attending: Internal Medicine

## 2019-11-15 ENCOUNTER — Encounter: Payer: Self-pay | Admitting: Endocrinology

## 2019-11-15 ENCOUNTER — Ambulatory Visit (INDEPENDENT_AMBULATORY_CARE_PROVIDER_SITE_OTHER): Payer: Medicare HMO | Admitting: Endocrinology

## 2019-11-15 ENCOUNTER — Other Ambulatory Visit: Payer: Self-pay

## 2019-11-15 DIAGNOSIS — M9903 Segmental and somatic dysfunction of lumbar region: Secondary | ICD-10-CM | POA: Diagnosis not present

## 2019-11-15 DIAGNOSIS — U071 COVID-19: Secondary | ICD-10-CM

## 2019-11-15 DIAGNOSIS — M9902 Segmental and somatic dysfunction of thoracic region: Secondary | ICD-10-CM | POA: Diagnosis not present

## 2019-11-15 DIAGNOSIS — E0590 Thyrotoxicosis, unspecified without thyrotoxic crisis or storm: Secondary | ICD-10-CM | POA: Insufficient documentation

## 2019-11-15 DIAGNOSIS — R238 Other skin changes: Secondary | ICD-10-CM | POA: Diagnosis not present

## 2019-11-15 DIAGNOSIS — M5414 Radiculopathy, thoracic region: Secondary | ICD-10-CM | POA: Diagnosis not present

## 2019-11-15 DIAGNOSIS — M9904 Segmental and somatic dysfunction of sacral region: Secondary | ICD-10-CM | POA: Diagnosis not present

## 2019-11-15 DIAGNOSIS — M5417 Radiculopathy, lumbosacral region: Secondary | ICD-10-CM | POA: Diagnosis not present

## 2019-11-15 DIAGNOSIS — M5137 Other intervertebral disc degeneration, lumbosacral region: Secondary | ICD-10-CM | POA: Diagnosis not present

## 2019-11-15 LAB — TSH: TSH: 3.28 u[IU]/mL (ref 0.35–4.50)

## 2019-11-15 LAB — T4, FREE: Free T4: 1.31 ng/dL (ref 0.60–1.60)

## 2019-11-15 MED ORDER — METHIMAZOLE 10 MG PO TABS: 10.0000 mg | ORAL_TABLET | Freq: Every day | ORAL | 3 refills | Status: AC

## 2019-11-15 NOTE — Progress Notes (Signed)
Subjective  P returns for f/u of hyperthyroidism (Pt reports he took amiodarone for a brief time in 2005 and again in 2011.  He has been back on it consistently since 2019.  He was dx'ed with hyperthyroidism in 2011, when he was on amiodarone.  It resolved spontaneously (but pt says a course of prednisone might have normalized TFT, also).  He has never had thyroid imaging; he does not need to d/c amiodarone).  Since on tapazole, fatigue is less now Past Medical History:  Diagnosis Date  . Anxiety   . CAD (coronary artery disease)   . Diabetes mellitus type II, controlled ([place])   . Heart failure, systolic, acute on chronic ([place])   . ICD (implantable cardiac defibrillator), single, in situ   . ISCHEMIC CARDIOMYOPATHY 03/19/2009   Qualifier: Diagnosis of  By: Boyce Medici RN, BSN, [place]    . LEFT VENTRICULAR MURAL THROMBUS 02/18/2009   Qualifier: Diagnosis of  By: Stevie Kern NP-C, Sharyn Lull    . Melanoma ([place]) 2019  . MI (myocardial infarction) ([place]) 08/2003  . PAROXYSMAL VENTRICULAR TACHYCARDIA 03/19/2009   Qualifier: Diagnosis of  By: Boyce Medici RN, BSN, [place]    . Pneumonia 02/08/2019  . SYSTOLIC HEART FAILURE, ACUTE ON CHRONIC 01/02/2009   Qualifier: Diagnosis of  By: Stevie Kern NP-C, Sharyn Lull    . Ventricular tachycardia [facility])     Past Surgical History:  Procedure Laterality Date  . ABLATION  03/2018   IN [place]  . AXILLARY LYMPH NODE DISSECTION Right 10/05/2018   RIGHT AXILLARY LYMPH NODE DISSECTION   . AXILLARY LYMPH NODE DISSECTION Right 10/05/2018   Procedure: RIGHT AXILLARY LYMPH NODE DISSECTION;  Surgeon: Stark Klein, MD;  Location: [facility];  Service: General;  Laterality: Right;  . CARDIAC CATHETERIZATION     01/30/18 [facility]): Occluded LAD. Patent LCX and RCA. Normal LVEDP 14 mmHg. Medical managment.  Marland Kitchen CATARACT EXTRACTION    . INSERT / REPLACE / REMOVE PACEMAKER    . SKIN BIOPSY  2019   melanoma R shoulder  . TONSILLECTOMY AND ADENOIDECTOMY  1969    Social History    Socioeconomic History  . Marital status: Single    Spouse name: Not on file  . Number of children: Not on file  . Years of education: Not on file  . Highest education level: Not on file  Occupational History  . Occupation: Unemployed since 10/2008  Tobacco Use  . Smoking status: Former Smoker    Packs/day: 1.00    Years: 20.00    Pack years: 20.00    Types: Cigars, Cigarettes    Quit date: 02/22/2004    Years since quitting: 15.7  . Smokeless tobacco: Never Used  Substance and Sexual Activity  . Alcohol use: Yes    Comment: weekly  . Drug use: No  . Sexual activity: Not on file  Other Topics Concern  . Not on file  Social History Narrative   Single   Lives with sister   No regular exercise   Social Determinants of Health   Financial Resource Strain:   . Difficulty of Paying Living Expenses: Not on file  Food Insecurity:   . Worried About Charity fundraiser in the Last Year: Not on file  . Ran Out of Food in the Last Year: Not on file  Transportation Needs:   . Lack of Transportation (Medical): Not on file  . Lack of Transportation (Non-Medical): Not on file  Physical Activity:   . Days of Exercise per Week: Not  on file  . Minutes of Exercise per Session: Not on file  Stress:   . Feeling of Stress : Not on file  Social Connections:   . Frequency of Communication with Friends and Family: Not on file  . Frequency of Social Gatherings with Friends and Family: Not on file  . Attends Religious Services: Not on file  . Active Member of Clubs or Organizations: Not on file  . Attends Archivist Meetings: Not on file  . Marital Status: Not on file  Intimate Partner Violence:   . Fear of Current or Ex-Partner: Not on file  . Emotionally Abused: Not on file  . Physically Abused: Not on file  . Sexually Abused: Not on file    Current Outpatient Medications on File Prior to Visit  Medication Sig Dispense Refill  . acyclovir (ZOVIRAX) 800 MG tablet Take 1  tablet (800 mg total) by mouth 3 (three) times daily. Use for 4-6 days at onset of symptoms (Patient taking differently: Take 800 mg by mouth once a week. Use for 4-6 days at onset of symptoms) 90 tablet 1  . aspirin 81 MG tablet Take 81 mg by mouth daily.      . blood glucose meter kit and supplies Dispense based on patient and insurance preference. Use up to four times daily as directed. (FOR ICD-10 E10.9, E11.9). 1 each 0  . carvedilol (COREG) 3.125 MG tablet Take 3.125 mg by mouth 2 (two) times daily.    . empagliflozin (JARDIANCE) 10 MG TABS tablet Take 10 mg by mouth daily. 90 tablet 3  . ipratropium (ATROVENT) 0.06 % nasal spray USE 2 SPRAYS INTO BOTH NOSTRILS 3 (THREE) TIMES DAILY. 60 mL 1  . metFORMIN (GLUCOPHAGE-XR) 500 MG 24 hr tablet TAKE 2 TABLETS (1,000 MG TOTAL) BY MOUTH AT BEDTIME. (Patient taking differently: Take 500 mg by mouth 2 (two) times daily. ) 180 tablet 0  . mexiletine (MEXITIL) 150 MG capsule Take 1 capsule (150 mg total) by mouth 3 (three) times daily. 270 capsule 3  . sacubitril-valsartan (ENTRESTO) 24-26 MG Take 1 tablet by mouth 2 (two) times daily. 180 tablet 3  . amiodarone (PACERONE) 200 MG tablet Take 1 tablet (200 mg total) by mouth daily. 90 tablet 3  . rosuvastatin (CRESTOR) 10 MG tablet Take 1 tablet (10 mg total) by mouth daily. 90 tablet 3   No current facility-administered medications on file prior to visit.    No Known Allergies  Family History  Problem Relation Age of Onset  . Alzheimer's disease Mother   . Thyroid disease Mother        uncertain type  . Kidney Stones Father   . Diabetes Father   . Alcohol abuse Father   . Alzheimer's disease Maternal Grandmother     BP 100/60 (BP Location: Left Arm, Patient Position: Sitting, Cuff Size: Normal)   Pulse 65   Ht '5\' 10"'  (1.778 m)   Wt 164 lb 6.4 oz (74.6 kg)   SpO2 100%   BMI 23.59 kg/m   Review of Systems  Denies fever    Objective:   Physical Exam VITAL SIGNS:  See vs page  GENERAL: no distress NECK: There is no palpable thyroid enlargement.  No thyroid nodule is palpable.  No palpable lymphadenopathy at the anterior neck. SKIN: not diaphoretic NEURO: slight fine tremor of the hands      Assessment & Plan:  Hyperthyroidism: recheck today AF: no need to d/c amiodarone

## 2019-11-15 NOTE — Patient Instructions (Signed)
Blood tests are requested for you today.  We'll let you know about the results.   If ever you have fever while taking methimazole, stop it and call us, even if the reason is obvious, because of the risk of a rare side-effect.   It is best to never miss the medication.  However, if you do miss it, next best is to double up the next time.   There is no need to stop the amiodarone--we'll work around that.   Please come back for a follow-up appointment in 1 month.

## 2019-11-16 ENCOUNTER — Encounter: Payer: Self-pay | Admitting: Endocrinology

## 2019-11-16 ENCOUNTER — Other Ambulatory Visit: Payer: Self-pay

## 2019-11-16 ENCOUNTER — Encounter: Payer: Self-pay | Admitting: Family Medicine

## 2019-11-16 ENCOUNTER — Telehealth: Payer: Medicare HMO | Admitting: Family Medicine

## 2019-11-16 DIAGNOSIS — E1165 Type 2 diabetes mellitus with hyperglycemia: Secondary | ICD-10-CM

## 2019-11-16 DIAGNOSIS — C439 Malignant melanoma of skin, unspecified: Secondary | ICD-10-CM

## 2019-11-16 DIAGNOSIS — E0590 Thyrotoxicosis, unspecified without thyrotoxic crisis or storm: Secondary | ICD-10-CM

## 2019-11-16 NOTE — Progress Notes (Signed)
Patient was post to have an in office visit, but was unable to have this secondary to pending Covid test.  He did not realize and agreeing to Covid testing that he was called about that this would not allow him to be seen in the office today.  He was unsure why Covid test was ordered, but on chart review I believe it may been ordered so that he can complete CT scan that is pending next week.  He has no acute issues that need to be discussed today, but would prefer to return when he can have an in office evaluation.  He will check online and see if he can schedule this.  I have asked him to let me know if he does not find an opening as we can work on finding him a spot since he is due for chronic condition visit.

## 2019-11-17 LAB — NOVEL CORONAVIRUS, NAA: SARS-CoV-2, NAA: NOT DETECTED

## 2019-11-19 ENCOUNTER — Other Ambulatory Visit: Payer: Self-pay

## 2019-11-19 ENCOUNTER — Other Ambulatory Visit: Payer: Medicare HMO

## 2019-11-19 DIAGNOSIS — M9904 Segmental and somatic dysfunction of sacral region: Secondary | ICD-10-CM | POA: Diagnosis not present

## 2019-11-19 DIAGNOSIS — M5414 Radiculopathy, thoracic region: Secondary | ICD-10-CM | POA: Diagnosis not present

## 2019-11-19 DIAGNOSIS — M9903 Segmental and somatic dysfunction of lumbar region: Secondary | ICD-10-CM | POA: Diagnosis not present

## 2019-11-19 DIAGNOSIS — C779 Secondary and unspecified malignant neoplasm of lymph node, unspecified: Secondary | ICD-10-CM

## 2019-11-19 DIAGNOSIS — M5417 Radiculopathy, lumbosacral region: Secondary | ICD-10-CM | POA: Diagnosis not present

## 2019-11-19 DIAGNOSIS — C439 Malignant melanoma of skin, unspecified: Secondary | ICD-10-CM

## 2019-11-19 DIAGNOSIS — M5137 Other intervertebral disc degeneration, lumbosacral region: Secondary | ICD-10-CM | POA: Diagnosis not present

## 2019-11-19 DIAGNOSIS — M9902 Segmental and somatic dysfunction of thoracic region: Secondary | ICD-10-CM | POA: Diagnosis not present

## 2019-11-20 ENCOUNTER — Other Ambulatory Visit: Payer: Self-pay

## 2019-11-20 ENCOUNTER — Encounter: Payer: Self-pay | Admitting: Family Medicine

## 2019-11-20 ENCOUNTER — Inpatient Hospital Stay: Payer: Medicare HMO | Attending: Oncology

## 2019-11-20 DIAGNOSIS — Z7982 Long term (current) use of aspirin: Secondary | ICD-10-CM | POA: Insufficient documentation

## 2019-11-20 DIAGNOSIS — C786 Secondary malignant neoplasm of retroperitoneum and peritoneum: Secondary | ICD-10-CM | POA: Insufficient documentation

## 2019-11-20 DIAGNOSIS — C7802 Secondary malignant neoplasm of left lung: Secondary | ICD-10-CM | POA: Insufficient documentation

## 2019-11-20 DIAGNOSIS — C7951 Secondary malignant neoplasm of bone: Secondary | ICD-10-CM | POA: Diagnosis not present

## 2019-11-20 DIAGNOSIS — Z79899 Other long term (current) drug therapy: Secondary | ICD-10-CM | POA: Insufficient documentation

## 2019-11-20 DIAGNOSIS — C4361 Malignant melanoma of right upper limb, including shoulder: Secondary | ICD-10-CM | POA: Insufficient documentation

## 2019-11-20 DIAGNOSIS — C7971 Secondary malignant neoplasm of right adrenal gland: Secondary | ICD-10-CM | POA: Diagnosis not present

## 2019-11-20 DIAGNOSIS — C7972 Secondary malignant neoplasm of left adrenal gland: Secondary | ICD-10-CM | POA: Insufficient documentation

## 2019-11-20 DIAGNOSIS — C439 Malignant melanoma of skin, unspecified: Secondary | ICD-10-CM

## 2019-11-20 DIAGNOSIS — Z7984 Long term (current) use of oral hypoglycemic drugs: Secondary | ICD-10-CM | POA: Insufficient documentation

## 2019-11-20 LAB — CMP (CANCER CENTER ONLY)
ALT: 153 U/L — ABNORMAL HIGH (ref 0–44)
AST: 92 U/L — ABNORMAL HIGH (ref 15–41)
Albumin: 3.3 g/dL — ABNORMAL LOW (ref 3.5–5.0)
Alkaline Phosphatase: 95 U/L (ref 38–126)
Anion gap: 13 (ref 5–15)
BUN: 22 mg/dL (ref 8–23)
CO2: 24 mmol/L (ref 22–32)
Calcium: 8.8 mg/dL — ABNORMAL LOW (ref 8.9–10.3)
Chloride: 104 mmol/L (ref 98–111)
Creatinine: 1.2 mg/dL (ref 0.61–1.24)
GFR, Est AFR Am: >60 mL/min (ref >60)
GFR, Estimated: >60 mL/min (ref >60)
Glucose, Bld: 138 mg/dL — ABNORMAL HIGH (ref 70–99)
Potassium: 4.3 mmol/L (ref 3.5–5.1)
Sodium: 141 mmol/L (ref 135–145)
Total Bilirubin: 0.5 mg/dL (ref 0.3–1.2)
Total Protein: 6.5 g/dL (ref 6.5–8.1)

## 2019-11-20 LAB — CBC WITH DIFFERENTIAL (CANCER CENTER ONLY)
Abs Immature Granulocytes: 0.04 10*3/uL (ref 0.00–0.07)
Basophils Absolute: 0 10*3/uL (ref 0.0–0.1)
Basophils Relative: 0 %
Eosinophils Absolute: 0.1 10*3/uL (ref 0.0–0.5)
Eosinophils Relative: 2 %
HCT: 41.2 % (ref 39.0–52.0)
Hemoglobin: 13.4 g/dL (ref 13.0–17.0)
Immature Granulocytes: 1 %
Lymphocytes Relative: 11 %
Lymphs Abs: 0.8 10*3/uL (ref 0.7–4.0)
MCH: 29.6 pg (ref 26.0–34.0)
MCHC: 32.5 g/dL (ref 30.0–36.0)
MCV: 90.9 fL (ref 80.0–100.0)
Monocytes Absolute: 0.9 10*3/uL (ref 0.1–1.0)
Monocytes Relative: 12 %
Neutro Abs: 5.6 10*3/uL (ref 1.7–7.7)
Neutrophils Relative %: 74 %
Platelet Count: 327 10*3/uL (ref 150–400)
RBC: 4.53 MIL/uL (ref 4.22–5.81)
RDW: 13.1 % (ref 11.5–15.5)
WBC Count: 7.4 10*3/uL (ref 4.0–10.5)
nRBC: 0 % (ref 0.0–0.2)

## 2019-11-20 NOTE — Progress Notes (Signed)
ICD remote 

## 2019-11-21 ENCOUNTER — Ambulatory Visit (HOSPITAL_COMMUNITY)
Admission: RE | Admit: 2019-11-21 | Discharge: 2019-11-21 | Disposition: A | Discharge status: Home / Self Care | Payer: Medicare HMO | Source: Ambulatory Visit | Attending: Oncology | Admitting: Oncology

## 2019-11-21 ENCOUNTER — Encounter (HOSPITAL_COMMUNITY): Payer: Self-pay

## 2019-11-21 DIAGNOSIS — M5417 Radiculopathy, lumbosacral region: Secondary | ICD-10-CM | POA: Diagnosis not present

## 2019-11-21 DIAGNOSIS — C779 Secondary and unspecified malignant neoplasm of lymph node, unspecified: Secondary | ICD-10-CM | POA: Insufficient documentation

## 2019-11-21 DIAGNOSIS — C439 Malignant melanoma of skin, unspecified: Secondary | ICD-10-CM | POA: Diagnosis not present

## 2019-11-21 DIAGNOSIS — C7801 Secondary malignant neoplasm of right lung: Secondary | ICD-10-CM | POA: Diagnosis not present

## 2019-11-21 DIAGNOSIS — C7972 Secondary malignant neoplasm of left adrenal gland: Secondary | ICD-10-CM | POA: Diagnosis not present

## 2019-11-21 DIAGNOSIS — M9903 Segmental and somatic dysfunction of lumbar region: Secondary | ICD-10-CM | POA: Diagnosis not present

## 2019-11-21 DIAGNOSIS — M9902 Segmental and somatic dysfunction of thoracic region: Secondary | ICD-10-CM | POA: Diagnosis not present

## 2019-11-21 DIAGNOSIS — M9904 Segmental and somatic dysfunction of sacral region: Secondary | ICD-10-CM | POA: Diagnosis not present

## 2019-11-21 DIAGNOSIS — M5414 Radiculopathy, thoracic region: Secondary | ICD-10-CM | POA: Diagnosis not present

## 2019-11-21 DIAGNOSIS — M5137 Other intervertebral disc degeneration, lumbosacral region: Secondary | ICD-10-CM | POA: Diagnosis not present

## 2019-11-21 MED ORDER — IOHEXOL 9 MG/ML PO SOLN
ORAL | Status: AC
  Filled 2019-11-21: qty 1000

## 2019-11-21 MED ORDER — IOHEXOL 300 MG/ML  SOLN
100.0000 mL | Freq: Once | INTRAMUSCULAR | Status: AC | PRN
  Administered 2019-11-21: 10:00:00 100 mL via INTRAVENOUS

## 2019-11-21 MED ORDER — SODIUM CHLORIDE (PF) 0.9 % IJ SOLN
INTRAMUSCULAR | Status: AC
  Filled 2019-11-21: qty 50

## 2019-11-21 MED ORDER — IOHEXOL 9 MG/ML PO SOLN
500.0000 mL | ORAL | Status: AC
  Administered 2019-11-21 (×2): 500 mL via ORAL

## 2019-11-27 ENCOUNTER — Telehealth: Payer: Self-pay | Admitting: Oncology

## 2019-11-28 ENCOUNTER — Telehealth: Payer: Self-pay | Admitting: Pharmacist

## 2019-11-28 ENCOUNTER — Inpatient Hospital Stay (HOSPITAL_BASED_OUTPATIENT_CLINIC_OR_DEPARTMENT_OTHER): Payer: Medicare HMO | Admitting: Oncology

## 2019-11-28 DIAGNOSIS — C779 Secondary and unspecified malignant neoplasm of lymph node, unspecified: Secondary | ICD-10-CM

## 2019-11-28 DIAGNOSIS — C439 Malignant melanoma of skin, unspecified: Secondary | ICD-10-CM | POA: Diagnosis not present

## 2019-11-28 MED ORDER — DABRAFENIB MESYLATE 75 MG PO CAPS: 150.0000 mg | ORAL_CAPSULE | Freq: Two times a day (BID) | ORAL | 0 refills | Status: DC

## 2019-11-28 MED ORDER — TRAMETINIB DIMETHYL SULFOXIDE 2 MG PO TABS: 2.0000 mg | ORAL_TABLET | Freq: Every day | ORAL | 0 refills | Status: DC

## 2019-11-28 NOTE — Progress Notes (Signed)
Hematology and Oncology Follow Up for Telemedicine Visits  Lee Hall [id number] November 16, 1951 68 y.o. 11/28/2019 8:14 AM Lee Hall, MDKoberlein, Lee Berg, MD   I connected with [name] on 11/28/19 at  9:00 AM EST by telephone visit and verified that I am speaking with the correct person using two identifiers.   I discussed the limitations, risks, security and privacy concerns of performing an evaluation and management service by telemedicine and the availability of in-person appointments. I also discussed with the patient that there may be a patient responsible charge related to this service. The patient expressed understanding and agreed to proceed.  Other persons participating in the visit and their role in the encounter:  None  Patient's location:  Home Provider's location:  Office  Principle Diagnosis: 68 year old man with  melanoma of the right shoulder diagnosed in January 2019.  He was found to have T3N3, BRAF positive with 4 out of 28 lymph nodes involved in the right axilla.    Prior Therapy:  He is status post resection in December 09, 2017 completed in [place].  He had positive margins without any lymph node sampling.  He is status post right axillary lymph node dissection completed on 10/05/2018 by Dr. Barry Dienes.  Final pathology revealed 4 out of 28 lymph node involvement.  Nivolumab 480 mg monthly started in January 2020 for adjuvant purposes.  He is status post 2 months of therapy and therapy discontinued because of acute illness and hospitalization.   Current therapy: Active surveillance.   Interim History: [name] reports no major changes in his health.  He denies any nausea, vomiting or abdominal pain.  Denies any weight loss or appetite changes.  Is eating reasonably well at this time.  He does report chronic hip pain which is currently under evaluation by orthopedic surgery.  He denies any recent falls or syncope.  Denies any  palpitations.  Formal status and activity level remains unchanged.     Medications: I have reviewed the patient's current medications.  Current Outpatient Medications  Medication Sig Dispense Refill  . acyclovir (ZOVIRAX) 800 MG tablet Take 1 tablet (800 mg total) by mouth 3 (three) times daily. Use for 4-6 days at onset of symptoms (Patient taking differently: Take 800 mg by mouth once a week. Use for 4-6 days at onset of symptoms) 90 tablet 1  . amiodarone (PACERONE) 200 MG tablet Take 1 tablet (200 mg total) by mouth daily. 90 tablet 3  . aspirin 81 MG tablet Take 81 mg by mouth daily.      . blood glucose meter kit and supplies Dispense based on patient and insurance preference. Use up to four times daily as directed. (FOR ICD-10 E10.9, E11.9). 1 each 0  . carvedilol (COREG) 3.125 MG tablet Take 3.125 mg by mouth 2 (two) times daily.    . empagliflozin (JARDIANCE) 10 MG TABS tablet Take 10 mg by mouth daily. 90 tablet 3  . ipratropium (ATROVENT) 0.06 % nasal spray USE 2 SPRAYS INTO BOTH NOSTRILS 3 (THREE) TIMES DAILY. 60 mL 1  . metFORMIN (GLUCOPHAGE-XR) 500 MG 24 hr tablet TAKE 2 TABLETS (1,000 MG TOTAL) BY MOUTH AT BEDTIME. (Patient taking differently: Take 500 mg by mouth 2 (two) times daily. ) 180 tablet 0  . methimazole (TAPAZOLE) 10 MG tablet Take 1 tablet (10 mg total) by mouth daily. 30 tablet 3  . mexiletine (MEXITIL) 150 MG capsule Take 1 capsule (150 mg total) by mouth 3 (three) times daily. 270 capsule  3  . rosuvastatin (CRESTOR) 10 MG tablet Take 1 tablet (10 mg total) by mouth daily. 90 tablet 3  . sacubitril-valsartan (ENTRESTO) 24-26 MG Take 1 tablet by mouth 2 (two) times daily. 180 tablet 3   No current facility-administered medications for this visit.     Allergies: No Known Allergies  Past Medical History, Surgical history, Social history, and Family History unchanged on review.     Lab Results: Lab Results  Component Value Date   WBC 7.4 11/20/2019   HGB  13.4 11/20/2019   HCT 41.2 11/20/2019   MCV 90.9 11/20/2019   PLT 327 11/20/2019     Chemistry      Component Value Date/Time   NA 141 11/20/2019 0905   NA 137 03/05/2019 0000   K 4.3 11/20/2019 0905   CL 104 11/20/2019 0905   CO2 24 11/20/2019 0905   BUN 22 11/20/2019 0905   BUN 41 (A) 03/05/2019 0000   CREATININE 1.20 11/20/2019 0905   CREATININE 1.07 01/19/2019 1638   GLU 192 03/05/2019 0000      Component Value Date/Time   CALCIUM 8.8 (L) 11/20/2019 0905   ALKPHOS 95 11/20/2019 0905   AST 92 (H) 11/20/2019 0905   ALT 153 (H) 11/20/2019 0905   BILITOT 0.5 11/20/2019 0905     IMPRESSION: Multiple small bilateral pulmonary metastases, measuring up to 9 mm in the left lower lobe, new.  Bilateral adrenal metastases, measuring up to 2.5 cm on the left, new.  Widespread peritoneal implants in the abdomen/pelvis, measuring up to 2.5 cm, new.  Stable postsurgical changes in the right axilla/subpectoral region.  Stable sclerotic metastasis in the left L1 vertebral body.    Impression and Plan:  68 year old man with:  1.   Melanoma of the right shoulder diagnosed in January 2019.  He was found to have T3N3 ulcerated, BRAF positive  tumor.      He is on active surveillance after receiving 2 months of adjuvant therapy that was poorly tolerated and he deferred any additional therapy.  CT scan obtained on 11/21/2019 was personally reviewed and indicates metastatic disease.  He has pulmonary nodules in addition to adrenal nodule involvement as well peritoneal disease.  The natural course of this disease and treatment options were reviewed.  These would include combined immunotherapy agents with ipilimumab and nivolumab, single agent immunotherapy or BRAF targeted therapy.  Complication associated with all these therapies were reviewed.  Potential immune mediated complications such as pneumonitis, colitis and arthritis were reviewed.  Complication associated with oral  targeted therapy include nausea, fatigue, pyrexia on others.  After discussion today, he is agreeable to proceed with dabrafenib and trametinib.  We will check for drug interaction given his cardiac medication before proceeding with this approach.  He might require EKG or an echocardiogram prior to starting these therapy.    2.   Dermatology surveillance:  He continues to follow with dermatology which I recommended.  3.  Immune mediated complications:  He suffered respiratory failure around the time he was receiving nivolumab although was unclear to his bacterial pneumonia versus pneumonitis.  4.  CNS surveillance: No evidence of CNS metastasis at this time.  Last CT scan obtained on May 03, 2019.  5. Follow-up: Will be in the next few weeks to follow his progress.      I discussed the assessment and treatment plan with the patient. The patient was provided an opportunity to ask questions and all were answered. The patient agreed with the  plan and demonstrated an understanding of the instructions.   The patient was advised to call back or seek an in-person evaluation if the symptoms worsen or if the condition fails to improve as anticipated.  I provided 20 minutes of non face-to-face telephone visit time during this encounter, and > 50% was dedicated to reviewing laboratory data, imaging studies and treatment options.  Zola Button, MD 11/28/2019 8:14 AM

## 2019-11-28 NOTE — Telephone Encounter (Signed)
Oral Oncology Pharmacist Encounter  Received new prescription for Mekinist (trametinib) and Tafinlar (dabrafenib) for the treatment of metastatic melanoma, planned duration until disease progression or unacceptable drug toxicity.  CMP from 11/20/2019 assessed, no relevant lab abnormalities'. Prescription dose and frequency assessed.   Current medication list in Epic reviewed, one relevant DDIs with dabrafenib identified: -Amiodarone: Category D, dabrafenib may decrease the concentration of amiodarone. Monitor patient for diminished therapeutic effect of amiodarone. Per Cardiology Epic note patient is on amiodarone for VT. Consider consulting with cardiology about increase monitoring and possible need to increasing the amiodarone dose. Recommend Conducting an ECG once on combination of amiodarone and dabrafenib.  Prescription has been e-scribed to the [facility] for benefits analysis and approval.  Oral Oncology Clinic will continue to follow for insurance authorization, copayment issues, initial counseling and start date.  Darl Pikes, PharmD, BCPS, [facility] Hematology/Oncology Clinical Pharmacist [facility] [phone]  11/28/2019 10:05 AM

## 2019-11-28 NOTE — Progress Notes (Signed)
DISCONTINUE ON PATHWAY REGIMEN - Melanoma     A cycle is every 28 days:     Nivolumab   **Always confirm dose/schedule in your pharmacy ordering system**  REASON: Toxicities / Adverse Event PRIOR TREATMENT: [id number]: Nivolumab 480 mg q28 Days for up to 1 Year of Therapy TREATMENT RESPONSE: Unable to Evaluate  START ON PATHWAY REGIMEN - Melanoma and Other Skin Cancers     A cycle is every 28 days:     Dabrafenib      Trametinib   **Always confirm dose/schedule in your pharmacy ordering system**  Patient Characteristics: Melanoma, Distant Metastases or Unresectable Local Recurrence, Unresectable, Asymptomatic, First Line, BRAF V600 Activating Mutation Positive Disease Classification: Melanoma Disease Subtype: Cutaneous Therapeutic Status: Distant Metastases BRAF V600 Mutation Status: BRAF V600 Activating Mutation Positive Metastatic Disease Type: Asymptomatic Line of Therapy: First Line Intent of Therapy: Non-Curative / Palliative Intent, Discussed with Patient

## 2019-11-29 ENCOUNTER — Ambulatory Visit: Payer: Self-pay

## 2019-11-29 ENCOUNTER — Encounter: Payer: Self-pay | Admitting: Orthopedic Surgery

## 2019-11-29 ENCOUNTER — Other Ambulatory Visit: Payer: Self-pay

## 2019-11-29 ENCOUNTER — Telehealth: Payer: Self-pay | Admitting: Oncology

## 2019-11-29 ENCOUNTER — Other Ambulatory Visit: Payer: Self-pay | Admitting: Oncology

## 2019-11-29 ENCOUNTER — Ambulatory Visit (INDEPENDENT_AMBULATORY_CARE_PROVIDER_SITE_OTHER): Payer: Medicare HMO

## 2019-11-29 ENCOUNTER — Ambulatory Visit (INDEPENDENT_AMBULATORY_CARE_PROVIDER_SITE_OTHER): Payer: Medicare HMO | Admitting: Orthopedic Surgery

## 2019-11-29 VITALS — Ht 70.0 in | Wt 164.4 lb

## 2019-11-29 DIAGNOSIS — M25559 Pain in unspecified hip: Secondary | ICD-10-CM

## 2019-11-29 DIAGNOSIS — C439 Malignant melanoma of skin, unspecified: Secondary | ICD-10-CM

## 2019-11-29 DIAGNOSIS — M25551 Pain in right hip: Secondary | ICD-10-CM | POA: Diagnosis not present

## 2019-11-29 DIAGNOSIS — M5410 Radiculopathy, site unspecified: Secondary | ICD-10-CM

## 2019-11-29 DIAGNOSIS — M25552 Pain in left hip: Secondary | ICD-10-CM | POA: Diagnosis not present

## 2019-11-29 MED ORDER — PREDNISONE 10 MG PO TABS: 20.0000 mg | ORAL_TABLET | Freq: Every day | ORAL | 0 refills | Status: DC

## 2019-11-29 NOTE — Telephone Encounter (Signed)
Scheduled appt per 12/30 los.  Left a vm of the appt date and time.

## 2019-11-30 ENCOUNTER — Encounter: Payer: Self-pay | Admitting: Orthopedic Surgery

## 2019-12-03 ENCOUNTER — Telehealth: Payer: Self-pay

## 2019-12-03 ENCOUNTER — Ambulatory Visit (INDEPENDENT_AMBULATORY_CARE_PROVIDER_SITE_OTHER): Payer: Medicare HMO

## 2019-12-03 DIAGNOSIS — Z95810 Presence of automatic (implantable) cardiac defibrillator: Secondary | ICD-10-CM

## 2019-12-03 DIAGNOSIS — I5022 Chronic systolic (congestive) heart failure: Secondary | ICD-10-CM | POA: Diagnosis not present

## 2019-12-03 NOTE — Telephone Encounter (Signed)
Oral Oncology Patient Advocate Encounter  Received notification from [facility] that prior authorization for Mekinist is required.  PA submitted on CoverMyMeds Key [id number] Status is pending  Oral Oncology Clinic will continue to follow.  [place] Patient [place] Phone [phone] Fax [phone] 12/03/2019 8:25 AM

## 2019-12-03 NOTE — Telephone Encounter (Signed)
Oral Oncology Patient Advocate Encounter  Received notification from [facility] that prior authorization for Lee Hall is required.  PA submitted on CoverMyMeds Key [id number] Status is pending  Oral Oncology Clinic will continue to follow.   [place] Patient [place] Phone [phone] Fax [phone] 12/03/2019 8:23 AM

## 2019-12-03 NOTE — Telephone Encounter (Signed)
Oral Oncology Patient Advocate Encounter  Prior Authorization for Mekinist has been approved.    PA# [id number] Effective dates: 11/29/19 through 11/28/20  Patients co-pay is $2875.47  Oral Oncology Clinic will continue to follow.    [place] Patient [place] Phone [phone] Fax [phone] 12/03/2019 8:26 AM

## 2019-12-03 NOTE — Telephone Encounter (Signed)
Oral Oncology Lee Advocate Encounter  Prior Authorization for Lee Hall has been approved.    PA# [id number] Effective dates: 11/29/19 through 11/28/20  Patients co-pay is $2825.96  Oral Oncology Clinic will continue to follow.    Lee Hall Lee Hall Phone [phone] Fax [phone] 12/03/2019 8:24 AM

## 2019-12-04 ENCOUNTER — Other Ambulatory Visit: Payer: Self-pay

## 2019-12-04 ENCOUNTER — Encounter: Payer: Self-pay | Admitting: Orthopedic Surgery

## 2019-12-04 DIAGNOSIS — M25559 Pain in unspecified hip: Secondary | ICD-10-CM

## 2019-12-05 NOTE — Progress Notes (Signed)
EPIC Encounter for ICM Monitoring  Patient Name: Lee Hall is a 69 y.o. male Date: 12/05/2019 Primary Care Physican: [name], MD Primary Cardiologist:Taylor Electrophysiologist:Taylor Weight: unknown   Spoke with patient.  He is not feeling well due to severe hip pain.  Otherwise without complaint.  He has an echo scheduled but unsure why and wanted more information why it is was ordered.    HeartLogic Heart Failure Index0 which is within normal threshold.    Prescribed:No diuretic  Recommendations: No changes.  Follow-up plan: ICM clinic phone appointment on 01/07/2020.   91 day device clinic remote transmission 01/23/2020.  Office appt 12/31/2019 with Dr. Lovena Le.    Copy of ICM check sent to Dr. Lovena Le.       [place], RN 12/05/2019 9:33 AM

## 2019-12-05 NOTE — Progress Notes (Signed)
Returned call to patient and advised I spoke with Dr Tanna Furry nurse and the echo was not ordered by Cardiology.  Advised the echo was ordered by Dr Alen Blew and encouraged him to call that office for further information.

## 2019-12-06 ENCOUNTER — Telehealth: Payer: Self-pay

## 2019-12-06 NOTE — Telephone Encounter (Signed)
Oral Oncology Patient Advocate Encounter  Patient has been approved for copay assistance with [employer] [place] (TAF).  [employer] [place] will cover all copayment expenses for Tafinlar/Mekinist for the remainder of the calendar year.    The billing information is as follows and has been shared with [place].   Member ID: [id number] Group ID: [id number] PCN: [id number] BIN: [id number] Eligibility Dates: 11/30/19 to 11/28/20  Fund: [place] Patient [place] Phone [phone] Fax [phone] 12/06/2019 4:01 PM

## 2019-12-07 ENCOUNTER — Other Ambulatory Visit (HOSPITAL_COMMUNITY): Payer: Medicare HMO

## 2019-12-07 ENCOUNTER — Other Ambulatory Visit: Payer: Self-pay

## 2019-12-07 DIAGNOSIS — M25559 Pain in unspecified hip: Secondary | ICD-10-CM

## 2019-12-09 ENCOUNTER — Encounter: Payer: Self-pay | Admitting: Oncology

## 2019-12-10 ENCOUNTER — Encounter: Payer: Self-pay | Admitting: Orthopedic Surgery

## 2019-12-10 ENCOUNTER — Telehealth: Payer: Self-pay

## 2019-12-10 ENCOUNTER — Telehealth: Payer: Self-pay | Admitting: Pharmacist

## 2019-12-10 ENCOUNTER — Telehealth: Payer: Self-pay | Admitting: Oncology

## 2019-12-10 NOTE — Telephone Encounter (Signed)
Review CT abd pelvis 11/21/19

## 2019-12-10 NOTE — Telephone Encounter (Signed)
Order cancelled as requested

## 2019-12-10 NOTE — Telephone Encounter (Signed)
I notified the pt through mychart message about the plan for referral to Dr. Ernestina Patches. I will enter this is the pt states that he is in agreement with the plan. Can you please cancel the CT scan I am sorry for the confusion. Dr. Sharol Given reviewed the CT ABD pelvis that he had in December and though it was a dedicated view he saw enough to be able to discuss treatment options with the pt.

## 2019-12-10 NOTE — Telephone Encounter (Signed)
Oral Chemotherapy Pharmacist Encounter  Patient plans on picking up his medication following his ECHO on 12/11/19. He knows not get get started until the ECHO is reviewed and he is given the go ahead.  Patient Education I spoke with patient for overview of new oral chemotherapy medication: Mekinist (trametinib) and Tafinlar (dabrafenib) for the treatment of metastatic melanoma, planned duration until disease progression or unacceptable drug toxicity.   Counseled patient on administration, dosing, side effects, monitoring, drug-food interactions, safe handling, storage, and disposal. Patient will take: Mekinist: Take 1 tablet (2 mg total) by mouth daily. Take 1 hour before or 2 hours after a meal. Store refrigerated in Financial controller: Take 2 capsules (150 mg total) by mouth 2 (two) times daily. Take on an empty stomach 1 hour before or 2 hours after meals  DDI with dabrafenib and amiodarone reviewed with the patient. He was notified that Dr. Alen Blew reached out to his cardiologist about the interaction. His cardiology was okay with [place] proceeding with his melanoma treatment.  Side effects include but not limited to: Mekinist: rash/itchy skin, diarrhea, edema Tafinlar: decreased wbc, rash/itchy skin, HA, fever, fatigue  Reviewed with patient importance of keeping a medication schedule and plan for any missed doses.  Mr. Geiler voiced understanding and appreciation. All questions answered. Medication handout placed in the mail and emailed to patient.  Provided patient with [facility] phone number. Patient knows to call the office with questions or concerns. Oral Chemotherapy Navigation Clinic will continue to follow.  Darl Pikes, PharmD, BCPS, [facility] Hematology/Oncology Clinical Pharmacist [facility] Oral [facility] [phone]  12/10/2019 3:56 PM

## 2019-12-10 NOTE — Telephone Encounter (Signed)
I reviewed his CT scan of the lumbar spine.  He does have a metastatic lesion of L1 and areas of bony spurs.  Let us set up an appointment with Dr. Ernestina Patches for evaluation for epidural steroid injection.

## 2019-12-10 NOTE — Progress Notes (Signed)
Office Visit Note   Patient: Lee Hall           Date of Birth: Jun 19, 1951           MRN: [id number] Visit Date: 11/29/2019              Requested by: [name], MD [address] [place],  [place] [address] PCP: [name], MD  Chief Complaint  Patient presents with  . Right Hip - Pain, New Patient (Initial Visit)  . Left Hip - Pain      HPI: Patient is a 69 year old gentleman who presents complaining of bilateral buttocks hip pain worse on the left than the right.  Patient states that this started about 2 weeks ago and that his pain is 11 out of 10 he states he cannot sit stand or rest.  He states that Monday he started having some relief and does feel better today.  Patient did bring a CD from the chiropractor he feels like he may have injured himself by swinging his leg into the car.  He states he does have some groin pain.  Patient does have a Oncologist he is unsure if this is MRI compatible.  Assessment & Plan: Visit Diagnoses:  1. Hip pain   2. Radicular low back pain     Plan: Start him on a prednisone Dosepak and will evaluate for MRI scan versus CT scan of the lumbar spine depending on the type of defibrillator.  Follow-Up Instructions: Return in about 2 weeks (around 12/13/2019).   Ortho Exam  Patient is alert, oriented, no adenopathy, well-dressed, normal affect, normal respiratory effort. Examination patient has left-sided radicular pain laterally and into the groin.  Patient states that the right-sided pain is sporadic.  Patient has no pain with range of motion of both hips knees or ankles.  He has a positive straight leg raise bilaterally with motor strength 5/5 in all motor groups of both lower extremities.  Imaging: No results found. No images are attached to the encounter.  Labs: Lab Results  Component Value Date   HGBA1C 6.5 10/04/2019   HGBA1C 6.7 (H) 06/05/2019   HGBA1C 7.0 (H) 01/19/2019     REPTSTATUS 02/05/2019 FINAL 02/01/2019   GRAMSTAIN  02/01/2019    ABUNDANT WBC PRESENT,BOTH PMN AND MONONUCLEAR RARE GRAM POSITIVE COCCI RARE GRAM VARIABLE ROD    CULT  02/01/2019    RARE Consistent with normal respiratory flora. Performed at [facility], [place] [address] [place], [place] [address]      Lab Results  Component Value Date   ALBUMIN 3.3 (L) 11/20/2019   ALBUMIN 4.4 10/03/2019   ALBUMIN 4.4 06/05/2019    Lab Results  Component Value Date   MG 2.2 02/13/2019   MG 2.2 02/12/2019   MG 2.4 02/10/2019   No results found for: VD25OH  No results found for: PREALBUMIN CBC EXTENDED Latest Ref Rng & Units 11/20/2019 09/26/2019 05/31/2019  WBC 4.0 - 10.5 K/uL 7.4 6.1 5.8  RBC 4.22 - 5.81 MIL/uL 4.53 4.63 4.66  HGB 13.0 - 17.0 g/dL 13.4 14.4 14.6  HCT 39.0 - 52.0 % 41.2 45.0 44.5  PLT 150 - 400 K/uL 327 171 173  NEUTROABS 1.7 - 7.7 K/uL 5.6 - 4.1  LYMPHSABS 0.7 - 4.0 K/uL 0.8 - 0.9     Body mass index is 23.59 kg/m.  Orders:  Orders Placed This Encounter  Procedures  . XR HIP UNILAT W OR W/O  PELVIS 2-3 VIEWS RIGHT  . XR HIP UNILAT W OR W/O PELVIS 2-3 VIEWS LEFT   Meds ordered this encounter  Medications  . predniSONE (DELTASONE) 10 MG tablet    Sig: Take 2 tablets (20 mg total) by mouth daily with breakfast.    Dispense:  60 tablet    Refill:  0     Procedures: No procedures performed  Clinical Data: No additional findings.  ROS:  All other systems negative, except as noted in the HPI. Review of Systems  Objective: Vital Signs: Ht 5\' 10"  (1.778 m)   Wt 164 lb 6.4 oz (74.6 kg)   BMI 23.59 kg/m   Specialty Comments:  No specialty comments available.  PMFS History: Patient Active Problem List   Diagnosis Date Noted  . Hyperthyroidism 11/15/2019  . Aortic ectasia, abdominal ([place]) 05/11/2019  . Upper airway cough syndrome 04/13/2019  . Diabetes mellitus type II, controlled ([place]) 02/23/2019  . ARF (acute renal failure) ([place])  02/23/2019  . DOE (dyspnea on exertion) 02/22/2019  . Pneumonia of right lower lobe due to infectious organism 02/22/2019  . Lower extremity edema 02/22/2019  . Healthcare maintenance 02/22/2019  . Coronary artery disease involving native coronary artery of native heart without angina pectoris   . Chronic systolic congestive heart failure ([place])   . S/P placement of cardiac pacemaker   . Atrial fibrillation ([place])   . History of sustained ventricular tachycardia   . Acute hypoxemic respiratory failure ([place])   . Cardiac arrest ([place])   . Community acquired pneumonia of right lung   . Severe sepsis ([place])   . Septic shock ([place]) 02/01/2019  . Malignant melanoma metastatic to lymph node ([place]) 10/05/2018  . Tremor of right hand 08/07/2018  . Melanoma of skin ([place]) 08/07/2018  . Abnormal positron emission tomography (PET) scan 08/07/2018  . Nonischemic cardiomyopathy ([place]) 03/19/2009  . Ventricular tachycardia ([place]) 03/19/2009  . CAD, NATIVE VESSEL 02/18/2009  . LEFT VENTRICULAR MURAL THROMBUS 02/18/2009  . Chronic systolic heart failure ([place]) 01/02/2009  . TACHYCARDIA 01/02/2009  . Implantable cardioverter-defibrillator (ICD) in situ 01/02/2009   Past Medical History:  Diagnosis Date  . Anxiety   . CAD (coronary artery disease)   . Diabetes mellitus type II, controlled ([place])   . Heart failure, systolic, acute on chronic ([place])   . ICD (implantable cardiac defibrillator), single, in situ   . ISCHEMIC CARDIOMYOPATHY 03/19/2009   Qualifier: Diagnosis of  By: Boyce Medici RN, BSN, [place]    . LEFT VENTRICULAR MURAL THROMBUS 02/18/2009   Qualifier: Diagnosis of  By: Stevie Kern NP-C, Sharyn Lull    . Melanoma ([place]) 2019  . MI (myocardial infarction) ([place]) 08/2003  . PAROXYSMAL VENTRICULAR TACHYCARDIA 03/19/2009   Qualifier: Diagnosis of  By: Boyce Medici RN, BSN, [place]    . Pneumonia 02/08/2019  . SYSTOLIC HEART FAILURE, ACUTE ON CHRONIC 01/02/2009   Qualifier: Diagnosis of  By: Stevie Kern NP-C, Sharyn Lull    .  Ventricular tachycardia (HCC)     Family History  Problem Relation Age of Onset  . Alzheimer's disease Mother   . Thyroid disease Mother        uncertain type  . Kidney Stones Father   . Diabetes Father   . Alcohol abuse Father   . Alzheimer's disease Maternal Grandmother     Past Surgical History:  Procedure Laterality Date  . ABLATION  03/2018   IN [place]  . AXILLARY LYMPH NODE DISSECTION Right 10/05/2018   RIGHT AXILLARY LYMPH NODE DISSECTION   . AXILLARY  LYMPH NODE DISSECTION Right 10/05/2018   Procedure: RIGHT AXILLARY LYMPH NODE DISSECTION;  Surgeon: Stark Klein, MD;  Location: [facility];  Service: General;  Laterality: Right;  . CARDIAC CATHETERIZATION     01/30/18 [facility]): Occluded LAD. Patent LCX and RCA. Normal LVEDP 14 mmHg. Medical managment.  Marland Kitchen CATARACT EXTRACTION    . INSERT / REPLACE / REMOVE PACEMAKER    . SKIN BIOPSY  2019   melanoma R shoulder  . TONSILLECTOMY AND ADENOIDECTOMY  1969   Social History   Occupational History  . Occupation: Unemployed since 10/2008  Tobacco Use  . Smoking status: Former Smoker    Packs/day: 1.00    Years: 20.00    Pack years: 20.00    Types: Cigars, Cigarettes    Quit date: 02/22/2004    Years since quitting: 15.8  . Smokeless tobacco: Never Used  Substance and Sexual Activity  . Alcohol use: Yes    Comment: weekly  . Drug use: No  . Sexual activity: Not on file

## 2019-12-10 NOTE — Telephone Encounter (Signed)
Scheduled appt per 1/11 sch message - pt is aware of appt date and time

## 2019-12-11 ENCOUNTER — Ambulatory Visit (HOSPITAL_COMMUNITY)
Admission: RE | Admit: 2019-12-11 | Discharge: 2019-12-11 | Disposition: A | Discharge status: Home / Self Care | Payer: Medicare HMO | Source: Ambulatory Visit | Attending: Oncology | Admitting: Oncology

## 2019-12-11 ENCOUNTER — Telehealth: Payer: Self-pay | Admitting: Oncology

## 2019-12-11 ENCOUNTER — Other Ambulatory Visit: Payer: Self-pay

## 2019-12-11 ENCOUNTER — Telehealth: Payer: Self-pay

## 2019-12-11 DIAGNOSIS — M5410 Radiculopathy, site unspecified: Secondary | ICD-10-CM

## 2019-12-11 DIAGNOSIS — C779 Secondary and unspecified malignant neoplasm of lymph node, unspecified: Secondary | ICD-10-CM | POA: Insufficient documentation

## 2019-12-11 DIAGNOSIS — C439 Malignant melanoma of skin, unspecified: Secondary | ICD-10-CM | POA: Diagnosis not present

## 2019-12-11 DIAGNOSIS — I8000 Phlebitis and thrombophlebitis of superficial vessels of unspecified lower extremity: Secondary | ICD-10-CM | POA: Diagnosis not present

## 2019-12-11 DIAGNOSIS — Z01818 Encounter for other preprocedural examination: Secondary | ICD-10-CM | POA: Insufficient documentation

## 2019-12-11 MED FILL — TAFINLAR 75 MG CAPSULE: 75 | 30 days supply | Qty: 120 | Fill #0

## 2019-12-11 MED FILL — MEKINIST 2 MG TAB: 2 | 30 days supply | Qty: 30 | Fill #0

## 2019-12-11 NOTE — Progress Notes (Signed)
  Echocardiogram 2D Echocardiogram has been performed.  Lee Hall 12/11/2019, 11:39 AM

## 2019-12-11 NOTE — Telephone Encounter (Signed)
-----   Message from Evans Lance, MD sent at 12/06/2019 10:42 PM EST ----- I do not. We will see him back in 6 weeks and assess his amiodarone dosing.  ----- Message ----- From: Wyatt Portela, MD Sent: 11/29/2019   3:51 PM EST To: Evans Lance, MD  Unfortunately this patient has developed metastatic melanoma and I am planning to start dabrafenib.  Our pharmacist informed me that this drug reduce amiodarone concentration.  It looks like he has been tapered down from previous higher doses of amiodarone intentionally.  Do you think you would be problematic to start dabrafenib in this setting?  We are planning to obtain a baseline echo and we can certainly monitor with the EKG frequently if you feel that is appropriate.  I appreciate your help in this matter.  FS

## 2019-12-11 NOTE — Telephone Encounter (Signed)
For records

## 2019-12-12 ENCOUNTER — Inpatient Hospital Stay: Payer: Medicare HMO | Attending: Oncology | Admitting: Oncology

## 2019-12-12 ENCOUNTER — Other Ambulatory Visit: Payer: Self-pay | Admitting: Orthopedic Surgery

## 2019-12-12 ENCOUNTER — Other Ambulatory Visit: Payer: Self-pay

## 2019-12-12 DIAGNOSIS — C439 Malignant melanoma of skin, unspecified: Secondary | ICD-10-CM | POA: Diagnosis not present

## 2019-12-12 DIAGNOSIS — G8929 Other chronic pain: Secondary | ICD-10-CM | POA: Insufficient documentation

## 2019-12-12 DIAGNOSIS — C799 Secondary malignant neoplasm of unspecified site: Secondary | ICD-10-CM | POA: Insufficient documentation

## 2019-12-12 DIAGNOSIS — C779 Secondary and unspecified malignant neoplasm of lymph node, unspecified: Secondary | ICD-10-CM

## 2019-12-12 DIAGNOSIS — R918 Other nonspecific abnormal finding of lung field: Secondary | ICD-10-CM | POA: Insufficient documentation

## 2019-12-12 DIAGNOSIS — R5383 Other fatigue: Secondary | ICD-10-CM | POA: Insufficient documentation

## 2019-12-12 DIAGNOSIS — M5410 Radiculopathy, site unspecified: Secondary | ICD-10-CM

## 2019-12-12 DIAGNOSIS — C4361 Malignant melanoma of right upper limb, including shoulder: Secondary | ICD-10-CM | POA: Insufficient documentation

## 2019-12-12 DIAGNOSIS — R682 Dry mouth, unspecified: Secondary | ICD-10-CM | POA: Insufficient documentation

## 2019-12-12 MED ORDER — PREDNISONE 10 MG PO TABS: 20.0000 mg | ORAL_TABLET | Freq: Every day | ORAL | 0 refills | Status: DC

## 2019-12-12 NOTE — Telephone Encounter (Signed)
Rx sent 

## 2019-12-12 NOTE — Progress Notes (Signed)
Hematology and Oncology Follow Up for Telemedicine Visits  Lee Hall [id number] 14-May-1951 69 y.o. 12/12/2019 11:01 AM Lee Hall, MDKoberlein, Lee Berg, MD   I connected with Lee Hall on 12/12/19 at 11:45 AM EST by telephone visit and verified that I am speaking with the correct person using two identifiers.   I discussed the limitations, risks, security and privacy concerns of performing an evaluation and management service by telemedicine and the availability of in-person appointments. I also discussed with the patient that there may be a patient responsible charge related to this service. The patient expressed understanding and agreed to proceed.  Other persons participating in the visit and their role in the encounter:   Patient's location:  Home Provider's location:  Office  Principle Diagnosis: 69 year old man with  stage IV melanoma of the right shoulder documented in January 2021.  He was initially diagnosed in January 2019 with T3N3, BRAF positive with 4 out of 28 lymph nodes involved in the right axilla at the time of diagnosis.   Prior Therapy:  He is status post resection in December 09, 2017 completed in [place].  He had positive margins without any lymph node sampling.  He is status post right axillary lymph node dissection completed on 10/05/2018 by Dr. Barry Dienes.  Final pathology revealed 4 out of 28 lymph node involvement.  Nivolumab 480 mg monthly started in January 2020 for adjuvant purposes.  He is status post 2 months of therapy and therapy discontinued because of acute illness and hospitalization.   Current therapy:  Under evaluation to start trametinib and dabrafenib.   Interim History: Lee Hall reports no major changes in his health.  He continues to have reasonable performance status without any decline in ability to do so.  Performance status and activity level remains unchanged.  Denies any nausea, vomiting or abdominal  pain.  Denies any changes in his bowel habits or chest pain.      Medications: I have reviewed the patient's current medications.  Current Outpatient Medications  Medication Sig Dispense Refill  . acyclovir (ZOVIRAX) 800 MG tablet Take 1 tablet (800 mg total) by mouth 3 (three) times daily. Use for 4-6 days at onset of symptoms (Patient taking differently: Take 800 mg by mouth once a week. Use for 4-6 days at onset of symptoms) 90 tablet 1  . amiodarone (PACERONE) 200 MG tablet Take 1 tablet (200 mg total) by mouth daily. 90 tablet 3  . aspirin 81 MG tablet Take 81 mg by mouth daily.      . blood glucose meter kit and supplies Dispense based on patient and insurance preference. Use up to four times daily as directed. (FOR ICD-10 E10.9, E11.9). 1 each 0  . carvedilol (COREG) 3.125 MG tablet Take 3.125 mg by mouth 2 (two) times daily.    Marland Kitchen dabrafenib mesylate (TAFINLAR) 75 MG capsule Take 2 capsules (150 mg total) by mouth 2 (two) times daily. Take on an empty stomach 1 hour before or 2 hours after meals. 120 capsule 0  . empagliflozin (JARDIANCE) 10 MG TABS tablet Take 10 mg by mouth daily. 90 tablet 3  . ipratropium (ATROVENT) 0.06 % nasal spray USE 2 SPRAYS INTO BOTH NOSTRILS 3 (THREE) TIMES DAILY. 60 mL 1  . metFORMIN (GLUCOPHAGE-XR) 500 MG 24 hr tablet TAKE 2 TABLETS (1,000 MG TOTAL) BY MOUTH AT BEDTIME. (Patient taking differently: Take 500 mg by mouth 2 (two) times daily. ) 180 tablet 0  . methimazole (TAPAZOLE) 10 MG tablet  Take 1 tablet (10 mg total) by mouth daily. 30 tablet 3  . mexiletine (MEXITIL) 150 MG capsule Take 1 capsule (150 mg total) by mouth 3 (three) times daily. 270 capsule 3  . predniSONE (DELTASONE) 10 MG tablet Take 2 tablets (20 mg total) by mouth daily with breakfast. 60 tablet 0  . rosuvastatin (CRESTOR) 10 MG tablet Take 1 tablet (10 mg total) by mouth daily. 90 tablet 3  . sacubitril-valsartan (ENTRESTO) 24-26 MG Take 1 tablet by mouth 2 (two) times daily. 180  tablet 3  . trametinib dimethyl sulfoxide (MEKINIST) 2 MG tablet Take 1 tablet (2 mg total) by mouth daily. Take 1 hour before or 2 hours after a meal. Store refrigerated in original container. 30 tablet 0   No current facility-administered medications for this visit.     Allergies: No Known Allergies       Lab Results: Lab Results  Component Value Date   WBC 7.4 11/20/2019   HGB 13.4 11/20/2019   HCT 41.2 11/20/2019   MCV 90.9 11/20/2019   PLT 327 11/20/2019     Chemistry      Component Value Date/Time   NA 141 11/20/2019 0905   NA 137 03/05/2019 0000   K 4.3 11/20/2019 0905   CL 104 11/20/2019 0905   CO2 24 11/20/2019 0905   BUN 22 11/20/2019 0905   BUN 41 (A) 03/05/2019 0000   CREATININE 1.20 11/20/2019 0905   CREATININE 1.07 01/19/2019 1638   GLU 192 03/05/2019 0000      Component Value Date/Time   CALCIUM 8.8 (L) 11/20/2019 0905   ALKPHOS 95 11/20/2019 0905   AST 92 (H) 11/20/2019 0905   ALT 153 (H) 11/20/2019 0905   BILITOT 0.5 11/20/2019 0905        Impression and Plan:  69 year old man with:  1.   Stage IV melanoma of the right shoulder with that documented metastatic disease to the lung, peritoneum and adrenal gland noted in December 2020.  He was diagnosed with BRAF positive stage III disease in 2019.     The natural course of this disease was reiterated at this time as well as treatment options were reviewed.  Given his previous exposure to immunotherapy and his bulky disease I have recommended treatment with dabrafenib and trametinib at this time.  Risks and benefits and complication associated with this therapy was reviewed today.  Alternative options were also reviewed is agreeable to proceed at this time.    2.  Cardiac considerations: This was discussed with Dr. Lovena Le regarding his underlying cardiovascular issues specifically being on amiodarone.  His recommendation was to stop amiodarone altogether at this time..  Baseline  echocardiogram was obtained with a EF of 20 to 25%.    4. Follow-up: Will be in 2 weeks to assess his tolerance of his therapy.      I discussed the assessment and treatment plan with the patient. The patient was provided an opportunity to ask questions and all were answered. The patient agreed with the plan and demonstrated an understanding of the instructions.   The patient was advised to call back or seek an in-person evaluation if the symptoms worsen or if the condition fails to improve as anticipated.  I provided 20 minutes of non face-to-face telephone visit time during this encounter, and > 50% was spent on reviewing his disease status, complications related to therapy, alternative options as well as cardiovascular implication of his cancer treatment.  Zola Button, MD 12/12/2019 11:01 AM

## 2019-12-12 NOTE — Telephone Encounter (Signed)
Do you wish to refill while pending ESI appt?

## 2019-12-13 ENCOUNTER — Encounter: Payer: Self-pay | Admitting: Family Medicine

## 2019-12-13 ENCOUNTER — Other Ambulatory Visit: Payer: Self-pay

## 2019-12-14 ENCOUNTER — Ambulatory Visit (INDEPENDENT_AMBULATORY_CARE_PROVIDER_SITE_OTHER): Payer: Medicare HMO | Admitting: Family Medicine

## 2019-12-14 ENCOUNTER — Telehealth: Payer: Self-pay

## 2019-12-14 ENCOUNTER — Encounter: Payer: Self-pay | Admitting: Family Medicine

## 2019-12-14 VITALS — BP 98/50 | HR 68 | Temp 98.0°F | Ht 70.0 in | Wt 151.2 lb

## 2019-12-14 DIAGNOSIS — R748 Abnormal levels of other serum enzymes: Secondary | ICD-10-CM | POA: Diagnosis not present

## 2019-12-14 DIAGNOSIS — E1169 Type 2 diabetes mellitus with other specified complication: Secondary | ICD-10-CM | POA: Diagnosis not present

## 2019-12-14 DIAGNOSIS — I5022 Chronic systolic (congestive) heart failure: Secondary | ICD-10-CM | POA: Diagnosis not present

## 2019-12-14 DIAGNOSIS — R630 Anorexia: Secondary | ICD-10-CM

## 2019-12-14 DIAGNOSIS — R350 Frequency of micturition: Secondary | ICD-10-CM | POA: Diagnosis not present

## 2019-12-14 DIAGNOSIS — I428 Other cardiomyopathies: Secondary | ICD-10-CM

## 2019-12-14 DIAGNOSIS — E785 Hyperlipidemia, unspecified: Secondary | ICD-10-CM

## 2019-12-14 DIAGNOSIS — R634 Abnormal weight loss: Secondary | ICD-10-CM

## 2019-12-14 DIAGNOSIS — K5900 Constipation, unspecified: Secondary | ICD-10-CM

## 2019-12-14 DIAGNOSIS — R195 Other fecal abnormalities: Secondary | ICD-10-CM | POA: Diagnosis not present

## 2019-12-14 LAB — POC URINALSYSI DIPSTICK (AUTOMATED)
Bilirubin, UA: NEGATIVE
Glucose, UA: POSITIVE — AB
Ketones, UA: NEGATIVE
Leukocytes, UA: NEGATIVE
Nitrite, UA: POSITIVE
Protein, UA: POSITIVE — AB
Spec Grav, UA: 1.015 (ref 1.010–1.025)
Urobilinogen, UA: 1 E.U./dL
pH, UA: 7 (ref 5.0–8.0)

## 2019-12-14 MED ORDER — GOLYTELY 236 G PO SOLR: ORAL | 0 refills | Status: DC

## 2019-12-14 MED ORDER — CEPHALEXIN 500 MG PO CAPS: 500.0000 mg | ORAL_CAPSULE | Freq: Two times a day (BID) | ORAL | 0 refills | Status: AC

## 2019-12-14 NOTE — Telephone Encounter (Signed)
Received voice mail message from patient asking for a response to 2 mychart messages sent to Dr Lovena Le.  Patient has been told by oncologist to stop Amiodarone and he is asking for Dr Tanna Furry approval to stop the medication.  The oncologist wants patient to stop the Amiodarone 24 hours prior to starting oral cancer medications on Monday 12/17/2019.   Routed to [facility] [address] office triage due to patient is requesting answer today and Dr Tanna Furry nurse is not in office today.  Patient request call back today either by phone or via mychart.

## 2019-12-14 NOTE — Progress Notes (Signed)
Lee Hall DOB: 11-25-1951 Encounter date: 12/14/2019  This is a 69 y.o. male who presents with Chief Complaint  Patient presents with  . Follow-up    History of present illness: Not doing well.   Stage IV melanoma right shoulder w mets to lung, peritoneum, adrenal glad noted dec 2020: starting dabrafenib, trametrinib. Dr. Alen Blew following. Amiodarone stopped after cardio discussion due to EF 25%. Has been working on decreasing the amiodarone.   Right and left hp pain- prednisone and plan for imaging pending through dr. Sharol Given. Prednisone took it to 3 from 10. Doc doesn't want to renew rx and was told that injection is only 50% effective and is painful.   Has lost 40lbs. Pain started in December and lost 18lbs since early dec.  Felt better as August started and recovering from hospitalization. Had returned to part time job and fear of covid sent him back home.   Woke up during first week December with pain. Primarily sleeps on right side. Not sure what caused pain to occur. Has not had response about refill. Frustrated with multiple calls to schedule MRI which he can't have because of defibrillator.   Food not sounding good. Feels like he is eating constantly but not gaining weight. Sleeping a lot. Can find comfortable spot with pillows to be able to sleep.   2 things begging for help with: Urinating every 30-45 min. Doesn't want to go to another doctor. Little bit of urination.  Hemorrhoids: has small bulge in crack. Hasn't had bm because afraid to push. Has a lot of gas passing, no odor. Sits on toilet BID no straining, pushing, but hasn't had bm in 4 days. Has taken stool softeners and has tried miralax without relief. Last bm was at least 6 days ago.   Has just had so much happening to him this year.   Blood sugar 150 this morning. Has been cheating more with comfort foods, sugars, Can't quench thirst unless drinks a coke (once or twice a week). Highest sugar this  morning was 200. Tends to grab sip of juice when waking up to urinate.   Thirst has been there since hospitalization.  Urine is in small amount. Doesn't feel like he is emptying. Has had uti previously after hospital and this was different. Lemon colored urine. No odor. No blood. No lower abd pain. Lower back bothers him some - more just soreness. No flank tenderness. Two urinations between 45 min and 1.5 hr. Prior to hospital didn't have urinary or prostate issues.  No Known Allergies Current Meds  Medication Sig  . acyclovir (ZOVIRAX) 800 MG tablet Take 1 tablet (800 mg total) by mouth 3 (three) times daily. Use for 4-6 days at onset of symptoms (Patient taking differently: Take 800 mg by mouth once a week. Use for 4-6 days at onset of symptoms)  . aspirin 81 MG tablet Take 81 mg by mouth daily.    . carvedilol (COREG) 3.125 MG tablet Take 3.125 mg by mouth 2 (two) times daily.  Marland Kitchen dabrafenib mesylate (TAFINLAR) 75 MG capsule Take 2 capsules (150 mg total) by mouth 2 (two) times daily. Take on an empty stomach 1 hour before or 2 hours after meals.  . empagliflozin (JARDIANCE) 10 MG TABS tablet Take 10 mg by mouth daily.  Marland Kitchen ipratropium (ATROVENT) 0.06 % nasal spray USE 2 SPRAYS INTO BOTH NOSTRILS 3 (THREE) TIMES DAILY.  . metFORMIN (GLUCOPHAGE-XR) 500 MG 24 hr tablet TAKE 2 TABLETS (1,000 MG TOTAL) BY MOUTH AT  BEDTIME. (Patient taking differently: Take 500 mg by mouth 2 (two) times daily. )  . methimazole (TAPAZOLE) 10 MG tablet Take 1 tablet (10 mg total) by mouth daily.  Marland Kitchen mexiletine (MEXITIL) 150 MG capsule Take 1 capsule (150 mg total) by mouth 3 (three) times daily.  . sacubitril-valsartan (ENTRESTO) 24-26 MG Take 1 tablet by mouth 2 (two) times daily.  . trametinib dimethyl sulfoxide (MEKINIST) 2 MG tablet Take 1 tablet (2 mg total) by mouth daily. Take 1 hour before or 2 hours after a meal. Store refrigerated in original container.  . [DISCONTINUED] predniSONE (DELTASONE) 10 MG tablet TAKE  2 TABLETS BY MOUTH ONCE DAILY WITH BREAKFAST  . [DISCONTINUED] predniSONE (DELTASONE) 10 MG tablet Take 2 tablets (20 mg total) by mouth daily with breakfast.    Review of Systems  Constitutional: Positive for appetite change, fatigue and unexpected weight change. Negative for chills and fever.  HENT: Negative for congestion, sinus pressure, sinus pain and sore throat.   Respiratory: Negative for cough, chest tightness, shortness of breath and wheezing.   Cardiovascular: Negative for chest pain, palpitations and leg swelling.  Gastrointestinal: Positive for abdominal distention, anal bleeding and constipation. Negative for nausea and vomiting.  Genitourinary: Positive for difficulty urinating and frequency. Negative for dysuria, flank pain and hematuria.    Objective:  BP (!) 98/50 (BP Location: Left Arm, Patient Position: Sitting, Cuff Size: Normal)   Pulse 68   Temp 98 F (36.7 C) (Temporal)   Ht 5\' 10"  (1.778 m)   Wt 151 lb 3.2 oz (68.6 kg)   SpO2 97%   BMI 21.69 kg/m   Weight: 151 lb 3.2 oz (68.6 kg)   BP Readings from Last 3 Encounters:  12/14/19 (!) 98/50  11/15/19 100/60  11/13/19 115/62   Wt Readings from Last 3 Encounters:  12/14/19 151 lb 3.2 oz (68.6 kg)  11/29/19 164 lb 6.4 oz (74.6 kg)  11/15/19 164 lb 6.4 oz (74.6 kg)    Physical Exam Abdominal:     General: Abdomen is flat. Bowel sounds are normal.     Palpations: Abdomen is soft.     Tenderness: There is no abdominal tenderness. There is no right CVA tenderness or left CVA tenderness.  Genitourinary:    Penis: Circumcised.      Testes: Normal.        Right: Mass or tenderness not present.        Left: Mass or tenderness not present.     Prostate: Enlarged. Not tender and no nodules present.     Rectum: Guaiac result positive. Tenderness, external hemorrhoid and internal hemorrhoid present.  Lymphadenopathy:     Lower Body: No right inguinal adenopathy. No left inguinal adenopathy.  Psychiatric:         Attention and Perception: Attention normal.        Mood and Affect: Mood is depressed.        Speech: Speech normal.        Thought Content: Thought content normal. Thought content does not include suicidal ideation. Thought content does not include suicidal plan.        Cognition and Memory: Cognition and memory normal.        Judgment: Judgment normal.     Comments: Mood is depressed.  States that he would not kill himself, but that he "welcomes death".  He feels overwhelmed with the amount of medical issues he has had in this last year, and feels like he just cannot get to a  place where he is feeling better.  His whole day revolves around timing of medications and the alarm going off on his phone every 2 hours so that he is properly spacing medications and not forgetting to take anything.  He is trying to follow directions by the book, but not seeing positive results with all of this.     Assessment/Plan 1. Frequent urination UA is positive for blood and nitrites.  Keflex sent in for patient as he tolerated this well before.  We are unable to send for culture due to timing just before the weekend, but we will have him return to give [place] a urine culture. - POCT Urinalysis Dipstick (Automated) - PSA; Future - Culture, Urine; Future  2. Nonischemic cardiomyopathy [facility]) Following with cardiology.  Most recent EF 25%.    3. Chronic systolic heart failure ([place]) Has been stable.  Follows regularly with cardiology.  4. Hyperlipidemia associated with type 2 diabetes mellitus (HCC) Continue Crestor 10 mg daily. - Lipid panel; Future  5. Elevated liver enzymes This was noted on most recent blood work in December.  He is not had this significant elevation in recent labs.  We will recheck.  CT done around the same time his blood work did not show any abnormality, masses, etc. in the liver.  He did, however have multiple mets in the abdomen and pelvis. - Comprehensive metabolic panel; Future -  Gamma GT; Future  6. Weight loss Uncertain cause for recent weight loss and change in appetite.  We are going to work on treating his urinary symptoms as well as constipation and will recheck blood work.  We will need to continue to address this. - Prealbumin; Future  7. Constipation, unspecified constipation type He has not had success with MiraLAX and stool softeners.  He wanted something over the weekend to really work on fixing the problem, so bowel prep was given to take in small doses until he is able to have a bowel movement.  He does have a very large hemorrhoid that likely will bleed with each bowel movement.  It is not tender, but is about 3 cm in size and will be irritated whenever he wipes.  He has never had a colonoscopy since he is refused in the past.  He did not follow through with completing Cologuard.  Pending response to above, may need to see a specialist regarding constipation and/or hemorrhoid.  8. Loss of appetite See above.  He is regularly drinking protein supplement drinks.  He is trying to eat throughout the day, but she is eating significantly less of his normal amount.  9. Stool guaiac positive See above.  Uncertain if this is coming from stool or internal hemorrhoids.   Return for pending bloodwork results; please schedule lab visit on Monday if possible. Visit time, reviewed recent lab work and specialty notes, and note composition more than 45 minutes total time.   Micheline Rough, MD

## 2019-12-15 ENCOUNTER — Encounter: Payer: Self-pay | Admitting: Family Medicine

## 2019-12-17 ENCOUNTER — Other Ambulatory Visit: Payer: Self-pay

## 2019-12-17 ENCOUNTER — Other Ambulatory Visit (INDEPENDENT_AMBULATORY_CARE_PROVIDER_SITE_OTHER): Payer: Medicare HMO

## 2019-12-17 DIAGNOSIS — R350 Frequency of micturition: Secondary | ICD-10-CM | POA: Diagnosis not present

## 2019-12-17 DIAGNOSIS — R634 Abnormal weight loss: Secondary | ICD-10-CM

## 2019-12-17 DIAGNOSIS — E785 Hyperlipidemia, unspecified: Secondary | ICD-10-CM | POA: Diagnosis not present

## 2019-12-17 DIAGNOSIS — E1169 Type 2 diabetes mellitus with other specified complication: Secondary | ICD-10-CM | POA: Diagnosis not present

## 2019-12-17 DIAGNOSIS — R748 Abnormal levels of other serum enzymes: Secondary | ICD-10-CM | POA: Diagnosis not present

## 2019-12-17 LAB — COMPREHENSIVE METABOLIC PANEL
ALT: 72 U/L — ABNORMAL HIGH (ref 0–53)
AST: 31 U/L (ref 0–37)
Albumin: 3.2 g/dL — ABNORMAL LOW (ref 3.5–5.2)
Alkaline Phosphatase: 94 U/L (ref 39–117)
BUN: 33 mg/dL — ABNORMAL HIGH (ref 6–23)
CO2: 24 mEq/L (ref 19–32)
Calcium: 8.4 mg/dL (ref 8.4–10.5)
Chloride: 100 mEq/L (ref 96–112)
Creatinine, Ser: 1.17 mg/dL (ref 0.40–1.50)
GFR: 62.3 mL/min (ref >60.00)
Glucose, Bld: 248 mg/dL — ABNORMAL HIGH (ref 70–99)
Potassium: 4.7 mEq/L (ref 3.5–5.1)
Sodium: 134 mEq/L — ABNORMAL LOW (ref 135–145)
Total Bilirubin: 0.5 mg/dL (ref 0.2–1.2)
Total Protein: 5.6 g/dL — ABNORMAL LOW (ref 6.0–8.3)

## 2019-12-17 LAB — LIPID PANEL
Cholesterol: 103 mg/dL (ref 0–200)
HDL: 26.1 mg/dL — ABNORMAL LOW (ref >39.00)
LDL Cholesterol: 38 mg/dL (ref 0–99)
NonHDL: 76.54
Total CHOL/HDL Ratio: 4
Triglycerides: 193 mg/dL — ABNORMAL HIGH (ref 0.0–149.0)
VLDL: 38.6 mg/dL (ref 0.0–40.0)

## 2019-12-17 LAB — GAMMA GT: GGT: 59 U/L — ABNORMAL HIGH (ref 7–51)

## 2019-12-17 LAB — PSA: PSA: 0.67 ng/mL (ref 0.10–4.00)

## 2019-12-18 ENCOUNTER — Other Ambulatory Visit (HOSPITAL_COMMUNITY)
Admission: RE | Admit: 2019-12-18 | Discharge: 2019-12-18 | Disposition: A | Discharge status: Home / Self Care | Payer: Medicare HMO | Source: Ambulatory Visit | Attending: Internal Medicine | Admitting: Internal Medicine

## 2019-12-18 DIAGNOSIS — Z01812 Encounter for preprocedural laboratory examination: Secondary | ICD-10-CM | POA: Insufficient documentation

## 2019-12-18 DIAGNOSIS — Z20822 Contact with and (suspected) exposure to covid-19: Secondary | ICD-10-CM | POA: Diagnosis not present

## 2019-12-18 LAB — URINE CULTURE
MICRO NUMBER:: 10052504
Result:: NO GROWTH
SPECIMEN QUALITY:: ADEQUATE

## 2019-12-18 LAB — PREALBUMIN: Prealbumin: 20 mg/dL — ABNORMAL LOW (ref 21–43)

## 2019-12-19 ENCOUNTER — Other Ambulatory Visit: Payer: Self-pay

## 2019-12-19 ENCOUNTER — Other Ambulatory Visit: Payer: Self-pay | Admitting: Family Medicine

## 2019-12-19 LAB — NOVEL CORONAVIRUS, NAA (HOSP ORDER, SEND-OUT TO REF LAB; TAT 18-24 HRS): SARS-CoV-2, NAA: NOT DETECTED

## 2019-12-19 MED ORDER — TAMSULOSIN HCL 0.4 MG PO CAPS: 0.4000 mg | ORAL_CAPSULE | Freq: Every day | ORAL | 2 refills | Status: DC

## 2019-12-20 ENCOUNTER — Encounter: Payer: Self-pay | Admitting: Family Medicine

## 2019-12-20 ENCOUNTER — Ambulatory Visit (INDEPENDENT_AMBULATORY_CARE_PROVIDER_SITE_OTHER): Payer: Medicare HMO | Admitting: Internal Medicine

## 2019-12-20 DIAGNOSIS — I472 Ventricular tachycardia, unspecified: Secondary | ICD-10-CM

## 2019-12-20 DIAGNOSIS — I428 Other cardiomyopathies: Secondary | ICD-10-CM

## 2019-12-20 DIAGNOSIS — I5022 Chronic systolic (congestive) heart failure: Secondary | ICD-10-CM | POA: Diagnosis not present

## 2019-12-20 DIAGNOSIS — Z95810 Presence of automatic (implantable) cardiac defibrillator: Secondary | ICD-10-CM

## 2019-12-20 DIAGNOSIS — Z79899 Other long term (current) drug therapy: Secondary | ICD-10-CM

## 2019-12-20 LAB — PULMONARY FUNCTION TEST
DL/VA % pred: 90 %
DL/VA: 3.69 ml/min/mmHg/L
DLCO cor % pred: 91 %
DLCO cor: 25.84 ml/min/mmHg
DLCO unc % pred: 88 %
DLCO unc: 24.92 ml/min/mmHg
FEF 25-75 Post: 4.89 L/sec
FEF 25-75 Pre: 4.49 L/sec
FEF2575-%Change-Post: 8 %
FEF2575-%Pred-Post: 170 %
FEF2575-%Pred-Pre: 156 %
FEV1-%Change-Post: 2 %
FEV1-%Pred-Post: 110 %
FEV1-%Pred-Pre: 107 %
FEV1-Post: 4.03 L
FEV1-Pre: 3.93 L
FEV1FVC-%Change-Post: 4 %
FEV1FVC-%Pred-Pre: 117 %
FEV6-%Change-Post: -2 %
FEV6-%Pred-Post: 93 %
FEV6-%Pred-Pre: 95 %
FEV6-Post: 4.35 L
FEV6-Pre: 4.46 L
FEV6FVC-%Pred-Post: 105 %
FEV6FVC-%Pred-Pre: 105 %
FVC-%Change-Post: -2 %
FVC-%Pred-Post: 89 %
FVC-%Pred-Pre: 91 %
FVC-Post: 4.39 L
FVC-Pre: 4.49 L
Post FEV1/FVC ratio: 92 %
Post FEV6/FVC ratio: 100 %
Pre FEV1/FVC ratio: 88 %
Pre FEV6/FVC Ratio: 100 %
RV % pred: 132 %
RV: 3.29 L
TLC % pred: 101 %
TLC: 7.56 L

## 2019-12-20 NOTE — Telephone Encounter (Signed)
I have reached out to the patient. GT

## 2019-12-21 ENCOUNTER — Other Ambulatory Visit: Payer: Self-pay

## 2019-12-21 ENCOUNTER — Ambulatory Visit: Payer: Medicare HMO | Admitting: Endocrinology

## 2019-12-21 ENCOUNTER — Encounter: Payer: Self-pay | Admitting: Endocrinology

## 2019-12-21 VITALS — BP 100/60 | HR 87 | Ht 70.0 in | Wt 150.6 lb

## 2019-12-21 DIAGNOSIS — E0590 Thyrotoxicosis, unspecified without thyrotoxic crisis or storm: Secondary | ICD-10-CM | POA: Diagnosis not present

## 2019-12-21 LAB — TSH: TSH: 4.34 u[IU]/mL (ref 0.35–4.50)

## 2019-12-21 LAB — T4, FREE: Free T4: 1.07 ng/dL (ref 0.60–1.60)

## 2019-12-21 LAB — T3, FREE: T3, Free: 2.4 pg/mL (ref 2.3–4.2)

## 2019-12-21 NOTE — Patient Instructions (Signed)
Blood tests are requested for you today.  We'll let you know about the results.  If ever you have fever while taking methimazole, stop it and call us, even if the reason is obvious, because of the risk of a rare side-effect. It is best to never miss the medication.  However, if you do miss it, next best is to double up the next time.   Please come back for a follow-up appointment in 1 month.

## 2019-12-21 NOTE — Progress Notes (Signed)
Documented 12/21/19 Full PFT performed today.

## 2019-12-21 NOTE — Progress Notes (Signed)
   Subjective:    Patient ID: Lee Hall, male    DOB: 1951-08-22, 69 y.o.   MRN: [id number]  HPI P returns for f/u of hyperthyroidism (Pt reports he took amiodarone for a brief time in 2005 and again in 2011.  He has been back on it consistently since 2019.  He was dx'ed with hyperthyroidism in 2011, when he was on amiodarone.  It resolved spontaneously (but pt says a course of prednisone might have normalized TFT, also); he has never had thyroid imaging; he d/c'ed amiodarone 1/21).  Main symptom is fatigue.    Review of Systems Denies fever.     Objective:   Physical Exam VITAL SIGNS:  See vs page GENERAL: no distress NECK: There is no palpable thyroid enlargement.  No thyroid nodule is palpable.  No palpable lymphadenopathy at the anterior neck.    Lab Results  Component Value Date   TSH 4.34 12/21/2019      Assessment & Plan:  Hyperthyroidism: well-controlled AF: amiodarone has been d/c'ed, so need for tapazole may decrease over the next few mos. Melanoma: he may continue to be off and on amiodarone, so he'll need ongoing TFT monitoring indefinitely   Patient Instructions  Blood tests are requested for you today.  We'll let you know about the results.  If ever you have fever while taking methimazole, stop it and call us, even if the reason is obvious, because of the risk of a rare side-effect. It is best to never miss the medication.  However, if you do miss it, next best is to double up the next time.   Please come back for a follow-up appointment in 1 month.

## 2019-12-25 ENCOUNTER — Other Ambulatory Visit: Payer: Self-pay

## 2019-12-25 ENCOUNTER — Inpatient Hospital Stay: Payer: Medicare HMO | Admitting: Oncology

## 2019-12-25 ENCOUNTER — Inpatient Hospital Stay: Payer: Medicare HMO

## 2019-12-25 VITALS — BP 101/56 | HR 81 | Temp 97.8°F | Resp 18 | Ht 70.0 in | Wt 151.0 lb

## 2019-12-25 DIAGNOSIS — C439 Malignant melanoma of skin, unspecified: Secondary | ICD-10-CM

## 2019-12-25 DIAGNOSIS — R5383 Other fatigue: Secondary | ICD-10-CM | POA: Diagnosis not present

## 2019-12-25 DIAGNOSIS — C4361 Malignant melanoma of right upper limb, including shoulder: Secondary | ICD-10-CM | POA: Diagnosis not present

## 2019-12-25 DIAGNOSIS — R682 Dry mouth, unspecified: Secondary | ICD-10-CM | POA: Diagnosis not present

## 2019-12-25 DIAGNOSIS — G8929 Other chronic pain: Secondary | ICD-10-CM | POA: Diagnosis not present

## 2019-12-25 DIAGNOSIS — R918 Other nonspecific abnormal finding of lung field: Secondary | ICD-10-CM | POA: Diagnosis not present

## 2019-12-25 DIAGNOSIS — C779 Secondary and unspecified malignant neoplasm of lymph node, unspecified: Secondary | ICD-10-CM | POA: Diagnosis not present

## 2019-12-25 DIAGNOSIS — C799 Secondary malignant neoplasm of unspecified site: Secondary | ICD-10-CM | POA: Diagnosis not present

## 2019-12-25 LAB — CBC WITH DIFFERENTIAL (CANCER CENTER ONLY)
Abs Immature Granulocytes: 0.07 10*3/uL (ref 0.00–0.07)
Basophils Absolute: 0 10*3/uL (ref 0.0–0.1)
Basophils Relative: 0 %
Eosinophils Absolute: 0.2 10*3/uL (ref 0.0–0.5)
Eosinophils Relative: 2 %
HCT: 39.2 % (ref 39.0–52.0)
Hemoglobin: 12.6 g/dL — ABNORMAL LOW (ref 13.0–17.0)
Immature Granulocytes: 1 %
Lymphocytes Relative: 15 %
Lymphs Abs: 1 10*3/uL (ref 0.7–4.0)
MCH: 29.4 pg (ref 26.0–34.0)
MCHC: 32.1 g/dL (ref 30.0–36.0)
MCV: 91.4 fL (ref 80.0–100.0)
Monocytes Absolute: 0.5 10*3/uL (ref 0.1–1.0)
Monocytes Relative: 8 %
Neutro Abs: 5.1 10*3/uL (ref 1.7–7.7)
Neutrophils Relative %: 74 %
Platelet Count: 322 10*3/uL (ref 150–400)
RBC: 4.29 MIL/uL (ref 4.22–5.81)
RDW: 14.7 % (ref 11.5–15.5)
WBC Count: 6.9 10*3/uL (ref 4.0–10.5)
nRBC: 0 % (ref 0.0–0.2)

## 2019-12-25 LAB — CMP (CANCER CENTER ONLY)
ALT: 125 U/L — ABNORMAL HIGH (ref 0–44)
AST: 63 U/L — ABNORMAL HIGH (ref 15–41)
Albumin: 3.2 g/dL — ABNORMAL LOW (ref 3.5–5.0)
Alkaline Phosphatase: 130 U/L — ABNORMAL HIGH (ref 38–126)
Anion gap: 9 (ref 5–15)
BUN: 25 mg/dL — ABNORMAL HIGH (ref 8–23)
CO2: 26 mmol/L (ref 22–32)
Calcium: 8.8 mg/dL — ABNORMAL LOW (ref 8.9–10.3)
Chloride: 104 mmol/L (ref 98–111)
Creatinine: 1.04 mg/dL (ref 0.61–1.24)
GFR, Est AFR Am: >60 mL/min (ref >60)
GFR, Estimated: >60 mL/min (ref >60)
Glucose, Bld: 159 mg/dL — ABNORMAL HIGH (ref 70–99)
Potassium: 4.4 mmol/L (ref 3.5–5.1)
Sodium: 139 mmol/L (ref 135–145)
Total Bilirubin: 0.3 mg/dL (ref 0.3–1.2)
Total Protein: 6.2 g/dL — ABNORMAL LOW (ref 6.5–8.1)

## 2019-12-25 NOTE — Progress Notes (Signed)
Hematology and Oncology Follow Up Visit  Lee Hall [id number] October 18, 1951 69 y.o. 12/25/2019 8:10 AM Lee Hall, MDKoberlein, [name], MD   Principle Diagnosis: 69 year old man with cutaneous melanoma of the right shoulder diagnosed in January 2019.  He was found to have T3N3, BRAF positive with 4 out of 28 lymph nodes involved in the right axilla after lymph node dissection.  He developed a stage IV disease in December 2020 with multiple bilateral pulmonary nodules, adrenal metastasis and peritoneal implants.   Prior Therapy:  He is status post resection in December 09, 2017 completed in [place].  He had positive margins without any lymph node sampling.  He is status post right axillary lymph node dissection completed on 10/05/2018 by Dr. Barry Dienes.  Final pathology revealed 4 out of 28 lymph node involvement.  Nivolumab 480 mg monthly started in January 2020 for adjuvant purposes.  He is status post 2 months of therapy and therapy discontinued because of acute illness and hospitalization.   Current therapy: trametinib and dabrafenib started in January 2021.  Interim History: Lee Hall is here for return evaluation.  Since the last visit, he started BRAF targeted therapy and has taken trametinib and dabrafenib for the last 10 days.  He denies any nausea, vomiting or abdominal pain.  He denies any fevers or chills.  He does report some dry mouth and lethargy at times but overall is performing all activities of daily living.  Denies any recent falls or syncope.  He does report chronic back pain.  He has reported feeling down and depressed at times which has affected his appetite and lost close to 10 pounds.           Medications: Updated on review.    Allergies: No Known Allergies    .  Physical Exam:   Blood pressure (!) 101/56, pulse 81, temperature 97.8 F (36.6 C), resp. rate 18, height _0  (1.778 m), weight 151 lb (68.5 kg), SpO2 99  %.    ECOG: 1   General appearance: Comfortable appearing without any discomfort Head: Normocephalic without any trauma Oropharynx: Mucous membranes are moist and pink without any thrush or ulcers. Eyes: Pupils are equal and round reactive to light. Lymph nodes: No cervical, supraclavicular, inguinal or axillary lymphadenopathy.   Heart:regular rate and rhythm.  S1 and S2 without leg edema. Lung: Clear without any rhonchi or wheezes.  No dullness to percussion. Abdomin: Soft, nontender, nondistended with good bowel sounds.  No hepatosplenomegaly. Musculoskeletal: No joint deformity or effusion.  Full range of motion noted. Neurological: No deficits noted on motor, sensory and deep tendon reflex exam. Skin: No petechial rash or dryness.  Appeared moist.         Lab Results: Lab Results  Component Value Date   WBC 7.4 11/20/2019   HGB 13.4 11/20/2019   HCT 41.2 11/20/2019   MCV 90.9 11/20/2019   PLT 327 11/20/2019     Chemistry      Component Value Date/Time   NA 134 (L) 12/17/2019 1411   NA 137 03/05/2019 0000   K 4.7 12/17/2019 1411   CL 100 12/17/2019 1411   CO2 24 12/17/2019 1411   BUN 33 (H) 12/17/2019 1411   BUN 41 (A) 03/05/2019 0000   CREATININE 1.17 12/17/2019 1411   CREATININE 1.20 11/20/2019 0905   CREATININE 1.07 01/19/2019 1638   GLU 192 03/05/2019 0000      Component Value Date/Time   CALCIUM 8.4 12/17/2019 1411   ALKPHOS 94  12/17/2019 1411   AST 31 12/17/2019 1411   AST 92 (H) 11/20/2019 0905   ALT 72 (H) 12/17/2019 1411   ALT 153 (H) 11/20/2019 0905   BILITOT 0.5 12/17/2019 1411   BILITOT 0.5 11/20/2019 0905        Impression and Plan:  69 year old man with:  1.  Stage IV melanoma documented in December 2020 with diffuse metastasis.  He presented with T3N3 ulcerated melanoma of the right shoulder diagnosed in January 2019.    CT scan obtained on 11/21/2019 was personally reviewed and discussed with the patient again.  This  indicates stage IV melanoma which is not surprising given his aggressive melanoma.  Treatment options were reiterated again which include consideration for repeat immunotherapy versus continuing BRAF targeted therapy.  He is agreeable to continue at this time the plan is to repeat imaging studies in 3 months.     2.    Dermatology surveillance: No new skin lesions noted.  3.  Immune mediated complications: It is unclear whether his acute illness was related to immunotherapy previously but the time being we will try to avoid possible.  4.  CNS surveillance: Last imaging studies of the brain back in June 2020 showed no evidence of malignancy this will be repeated in 6 months.  5.  Cardiac considerations: He has been on amiodarone which I have discussed with Dr. Lovena Le to stop at this time.  He is off amiodarone.  6.  Goals of care and prognosis: This was discussed today in detail.  He understands therapy is palliative at this time and there will be no cure for his disease.  He is agreeable to continue.  7.  Follow-up: In 4 weeks for repeat evaluation.  30  minutes was spent on this encounter.  The time was dedicated to reviewing imaging studies, treatment options, addressing complications related to therapy and future plan of care.   Lee Button, MD 1/26/20218:10 AM

## 2019-12-26 ENCOUNTER — Telehealth: Payer: Self-pay | Admitting: Oncology

## 2019-12-26 NOTE — Telephone Encounter (Signed)
Scheduled appt per 1/26 los.  Spoke with pt and he is aware of his appt date and time.

## 2019-12-27 ENCOUNTER — Ambulatory Visit: Payer: Medicare HMO | Admitting: Orthopedic Surgery

## 2019-12-30 ENCOUNTER — Encounter: Payer: Self-pay | Admitting: Family Medicine

## 2019-12-31 ENCOUNTER — Telehealth: Payer: Self-pay

## 2019-12-31 ENCOUNTER — Ambulatory Visit: Payer: Medicare HMO | Admitting: Internal Medicine

## 2019-12-31 ENCOUNTER — Other Ambulatory Visit: Payer: Self-pay

## 2019-12-31 ENCOUNTER — Encounter: Payer: Medicare HMO | Admitting: Internal Medicine

## 2019-12-31 VITALS — BP 98/46 | HR 99 | Ht 72.0 in | Wt 152.0 lb

## 2019-12-31 DIAGNOSIS — I428 Other cardiomyopathies: Secondary | ICD-10-CM | POA: Diagnosis not present

## 2019-12-31 DIAGNOSIS — I5022 Chronic systolic (congestive) heart failure: Secondary | ICD-10-CM

## 2019-12-31 DIAGNOSIS — Z95810 Presence of automatic (implantable) cardiac defibrillator: Secondary | ICD-10-CM

## 2019-12-31 DIAGNOSIS — I472 Ventricular tachycardia, unspecified: Secondary | ICD-10-CM

## 2019-12-31 NOTE — Progress Notes (Signed)
HPI [name] returns today for followup. He is a pleasant 69 yo man with a h/o chronic systolic heart failure and VT, s/p ICD insertion. He moved to [place] a couple of years ago. He has been maintained in NSR on amiodarone. He was diagnosed with multiple myeloma and has been started on chemotherapy which is thought to be incompatible with the amiodarone he was taking. I asked him to stop the amiodarone. He is pending a repeat CT scan to evaluated his melanoma. He is appropriately depressed.  No Known Allergies   Current Outpatient Medications  Medication Sig Dispense Refill  . acyclovir (ZOVIRAX) 800 MG tablet Take 1 tablet (800 mg total) by mouth 3 (three) times daily. Use for 4-6 days at onset of symptoms (Patient taking differently: Take 800 mg by mouth once a week. Use for 4-6 days at onset of symptoms) 90 tablet 1  . aspirin 81 MG tablet Take 81 mg by mouth daily.      . blood glucose meter kit and supplies Dispense based on patient and insurance preference. Use up to four times daily as directed. (FOR ICD-10 E10.9, E11.9). 1 each 0  . carvedilol (COREG) 3.125 MG tablet Take 3.125 mg by mouth 2 (two) times daily.    Marland Kitchen dabrafenib mesylate (TAFINLAR) 75 MG capsule Take 2 capsules (150 mg total) by mouth 2 (two) times daily. Take on an empty stomach 1 hour before or 2 hours after meals. 120 capsule 0  . empagliflozin (JARDIANCE) 10 MG TABS tablet Take 10 mg by mouth daily. 90 tablet 3  . ipratropium (ATROVENT) 0.06 % nasal spray USE 2 SPRAYS INTO BOTH NOSTRILS 3 (THREE) TIMES DAILY. 60 mL 1  . metFORMIN (GLUCOPHAGE-XR) 500 MG 24 hr tablet TAKE 2 TABLETS (1,000 MG TOTAL) BY MOUTH AT BEDTIME. (Patient taking differently: Take 500 mg by mouth 2 (two) times daily. ) 180 tablet 0  . methimazole (TAPAZOLE) 10 MG tablet Take 1 tablet (10 mg total) by mouth daily. 30 tablet 3  . mexiletine (MEXITIL) 150 MG capsule Take 1 capsule (150 mg total) by mouth 3 (three) times daily. 270 capsule 3  .  sacubitril-valsartan (ENTRESTO) 24-26 MG Take 1 tablet by mouth 2 (two) times daily. 180 tablet 3  . tamsulosin (FLOMAX) 0.4 MG CAPS capsule Take 1 capsule (0.4 mg total) by mouth daily. 30 capsule 2  . trametinib dimethyl sulfoxide (MEKINIST) 2 MG tablet Take 1 tablet (2 mg total) by mouth daily. Take 1 hour before or 2 hours after a meal. Store refrigerated in original container. 30 tablet 0  . rosuvastatin (CRESTOR) 10 MG tablet Take 1 tablet (10 mg total) by mouth daily. 90 tablet 3   No current facility-administered medications for this visit.     Past Medical History:  Diagnosis Date  . Anxiety   . CAD (coronary artery disease)   . Cardiac arrest ([place])   . Diabetes mellitus type II, controlled ([place])   . Heart failure, systolic, acute on chronic ([place])   . ICD (implantable cardiac defibrillator), single, in situ   . ISCHEMIC CARDIOMYOPATHY 03/19/2009   Qualifier: Diagnosis of  By: Boyce Medici RN, BSN, [place]    . LEFT VENTRICULAR MURAL THROMBUS 02/18/2009   Qualifier: Diagnosis of  By: Stevie Kern NP-C, Sharyn Lull    . Melanoma ([place]) 2019  . MI (myocardial infarction) ([place]) 08/2003  . PAROXYSMAL VENTRICULAR TACHYCARDIA 03/19/2009   Qualifier: Diagnosis of  By: Boyce Medici RN, BSN, [place]    . Pneumonia  02/08/2019  . SYSTOLIC HEART FAILURE, ACUTE ON CHRONIC 01/02/2009   Qualifier: Diagnosis of  By: Stevie Kern NP-C, Sharyn Lull    . Ventricular tachycardia ([place])     ROS:   All systems reviewed and negative except as noted in the HPI.   Past Surgical History:  Procedure Laterality Date  . ABLATION  03/2018   IN [place]  . AXILLARY LYMPH NODE DISSECTION Right 10/05/2018   RIGHT AXILLARY LYMPH NODE DISSECTION   . AXILLARY LYMPH NODE DISSECTION Right 10/05/2018   Procedure: RIGHT AXILLARY LYMPH NODE DISSECTION;  Surgeon: Stark Klein, MD;  Location: [facility];  Service: General;  Laterality: Right;  . CARDIAC CATHETERIZATION     01/30/18 [facility]): [place] LAD. Patent LCX and RCA. Normal LVEDP 14  mmHg. Medical managment.  Marland Kitchen CATARACT EXTRACTION    . INSERT / REPLACE / REMOVE PACEMAKER    . SKIN BIOPSY  2019   melanoma R shoulder  . TONSILLECTOMY AND ADENOIDECTOMY  1969     Family History  Problem Relation Age of Onset  . Alzheimer's disease Mother   . Thyroid disease Mother        uncertain type  . Kidney Stones Father   . Diabetes Father   . Alcohol abuse Father   . Alzheimer's disease Maternal Grandmother      Social History   Socioeconomic History  . Marital status: Single    Spouse name: Not on file  . Number of children: Not on file  . Years of education: Not on file  . Highest education level: Not on file  Occupational History  . Occupation: Unemployed since 10/2008  Tobacco Use  . Smoking status: Former Smoker    Packs/day: 1.00    Years: 20.00    Pack years: 20.00    Types: Cigars, Cigarettes    Quit date: 02/22/2004    Years since quitting: 15.8  . Smokeless tobacco: Never Used  Substance and Sexual Activity  . Alcohol use: Yes    Comment: weekly  . Drug use: No  . Sexual activity: Not on file  Other Topics Concern  . Not on file  Social History Narrative   Single   Lives with sister   No regular exercise   Social Determinants of Health   Financial Resource Strain:   . Difficulty of Paying Living Expenses: Not on file  Food Insecurity:   . Worried About Charity fundraiser in the Last Year: Not on file  . Ran Out of Food in the Last Year: Not on file  Transportation Needs:   . Lack of Transportation (Medical): Not on file  . Lack of Transportation (Non-Medical): Not on file  Physical Activity:   . Days of Exercise per Week: Not on file  . Minutes of Exercise per Session: Not on file  Stress:   . Feeling of Stress : Not on file  Social Connections:   . Frequency of Communication with Friends and Family: Not on file  . Frequency of Social Gatherings with Friends and Family: Not on file  . Attends Religious Services: Not on file  .  Active Member of Clubs or Organizations: Not on file  . Attends Archivist Meetings: Not on file  . Marital Status: Not on file  Intimate Partner Violence:   . Fear of Current or Ex-Partner: Not on file  . Emotionally Abused: Not on file  . Physically Abused: Not on file  . Sexually Abused: Not on file  BP (!) 98/46   Pulse 99   Ht 6' (1.829 m)   Wt 152 lb (68.9 kg)   BMI 20.61 kg/m   Physical Exam:  Well appearing NAD HEENT: Unremarkable Neck:  No JVD, no thyromegally Lymphatics:  No adenopathy Back:  No CVA tenderness Lungs:  Clear with no wheezes HEART:  Regular rate rhythm, no murmurs, no rubs, no clicks Abd:  soft, positive bowel sounds, no organomegally, no rebound, no guarding Ext:  2 plus pulses, no edema, no cyanosis, no clubbing Skin:  No rashes no nodules Neuro:  CN II through XII intact, motor grossly intact  EKG - nsr with LBBB  DEVICE  Normal device function.  See PaceArt for details.   Assess/Plan: 1. VT - he has had no recurrent episodes. He stopped the amiodarone a few days ago. He will undergo watchful waiting. 2. Chronic systolic heart failure - he will continue his current meds. 3. Weight loss - I encouraged him to try and to eat more. He has lost 20 lbs. Unclear if this is due to his CA or chemotherapy. I spent25 minutes including 50% face to face time.   ,M.D. Marcella Dubs.D.

## 2019-12-31 NOTE — Patient Instructions (Addendum)
Medication Instructions:  Your physician recommends that you continue on your current medications as directed. Please refer to the Current Medication list given to you today.  Labwork: None ordered.  Testing/Procedures: None ordered.  Follow-Up: Your physician wants you to follow-up in: 6 months with Dr. Lovena Le.   You will receive a reminder letter in the mail two months in advance. If you don't receive a letter, please call our office to schedule the follow-up appointment.  Remote monitoring is used to monitor your ICD from home. This monitoring reduces the number of office visits required to check your device to one time per year. It allows [place] to keep an eye on the functioning of your device to ensure it is working properly. You are scheduled for a device check from home on 01/07/2020. You may send your transmission at any time that day. If you have a wireless device, the transmission will be sent automatically. After your physician reviews your transmission, you will receive a postcard with your next transmission date.  Any Other Special Instructions Will Be Listed Below (If Applicable).  If you need a refill on your cardiac medications before your next appointment, please call your pharmacy.

## 2019-12-31 NOTE — Telephone Encounter (Addendum)
I started a non-formulary [place] PA through covermymeds. Key: [employer]

## 2020-01-01 ENCOUNTER — Telehealth: Payer: Self-pay | Admitting: Orthopedic Surgery

## 2020-01-01 NOTE — Telephone Encounter (Signed)
Left voicemail on patient phone advising CD was ready for pickup at front desk.

## 2020-01-01 NOTE — Telephone Encounter (Signed)
Patient is requesting copy of xrays. Please call when ready. Will sign release when pick up. [phone]

## 2020-01-01 NOTE — Telephone Encounter (Signed)
I would like a repeat urinalysis and urine culture please.  Could we also get a urine cytology?  Please see other message as well

## 2020-01-01 NOTE — Telephone Encounter (Signed)
I don't understand his response. Can you see if you can talk with him and get the answers to the questions that I asked so that I can better try to help him?

## 2020-01-02 ENCOUNTER — Other Ambulatory Visit: Payer: Self-pay | Admitting: Oncology

## 2020-01-02 ENCOUNTER — Telehealth: Payer: Self-pay | Admitting: *Deleted

## 2020-01-02 NOTE — Telephone Encounter (Signed)
I called the pt and he stated he did not have any relief with the bowel prep as that was weeks ago and he used OTC stool softener instead.  Stated he is going once a week and the consistency is like "peanut butter" and does not know why we are having to go through this again.  I attempted to go over the questions below with the patient to no avail and message sent to PCP to contact the pt.

## 2020-01-03 ENCOUNTER — Telehealth: Payer: Self-pay

## 2020-01-03 NOTE — Telephone Encounter (Signed)
Called and informed patient of Dr. Hazeline Junker response below.  During phone call, patient asked if he should also stop his M[place], as he has not taken this since Tuesday due to the dizziness episode. Per Dr. Alen Blew, patient to hold both medications until next office visit on 01/23/20. Patient informed of Dr. Hazeline Junker instructions and verbalized understanding. Patient had also contacted office stating he was interested in having a MOST form and a DNR completed. While speaking to the patient, the patient stated he preferred to have his PCP complete these forms. Informed patient that if he was unable to have the forms completed by his PCP, that he could have them completed at his next office visit with Dr. Alen Blew. Patient verbalized understanding and will keep this office informed. Patient instructed to call office with any additional questions or concerns.

## 2020-01-03 NOTE — Telephone Encounter (Signed)
-----   Message from Wyatt Portela, MD sent at 01/03/2020 11:28 AM EST ----- Regarding: RE: Patient question Continue to hold medication till his next MD visit. Thanks ----- Message ----- From: Teodoro Spray, RN Sent: 01/03/2020  10:58 AM EST To: Wyatt Portela, MD Subject: Patient question                               Patient states for the last week, he has felt "disoriented" after taking tafinlar. Patient states that after taking his morning dose on Tuesday morning, he started to feel dizzy and "the room started spinning". Patient states when he would close his eyes, he could see black spots. Patient states he has also developed L sided jaw pain that is throbbing, rated 6/10.  Patient states he has not taken his [place] since Tuesday morning. Patient states that his dizziness has improved since stopping the tafinlar.   Patient wants to know what he should do in regards to his Tafinlar.  Please advise.  Thank you.

## 2020-01-04 ENCOUNTER — Telehealth: Payer: Self-pay

## 2020-01-04 ENCOUNTER — Inpatient Hospital Stay: Payer: Medicare HMO | Attending: Oncology | Admitting: Medical

## 2020-01-04 ENCOUNTER — Other Ambulatory Visit: Payer: Self-pay

## 2020-01-04 ENCOUNTER — Other Ambulatory Visit: Payer: Self-pay | Admitting: Emergency Medicine

## 2020-01-04 ENCOUNTER — Inpatient Hospital Stay: Payer: Medicare HMO

## 2020-01-04 VITALS — BP 93/67 | HR 90 | Temp 98.3°F | Resp 17 | Ht 72.0 in | Wt 145.5 lb

## 2020-01-04 DIAGNOSIS — Z9221 Personal history of antineoplastic chemotherapy: Secondary | ICD-10-CM | POA: Insufficient documentation

## 2020-01-04 DIAGNOSIS — H814 Vertigo of central origin: Secondary | ICD-10-CM

## 2020-01-04 DIAGNOSIS — Z87891 Personal history of nicotine dependence: Secondary | ICD-10-CM | POA: Insufficient documentation

## 2020-01-04 DIAGNOSIS — E860 Dehydration: Secondary | ICD-10-CM | POA: Diagnosis not present

## 2020-01-04 DIAGNOSIS — R3129 Other microscopic hematuria: Secondary | ICD-10-CM

## 2020-01-04 DIAGNOSIS — C439 Malignant melanoma of skin, unspecified: Secondary | ICD-10-CM

## 2020-01-04 DIAGNOSIS — I252 Old myocardial infarction: Secondary | ICD-10-CM | POA: Insufficient documentation

## 2020-01-04 DIAGNOSIS — C4361 Malignant melanoma of right upper limb, including shoulder: Secondary | ICD-10-CM | POA: Insufficient documentation

## 2020-01-04 DIAGNOSIS — C773 Secondary and unspecified malignant neoplasm of axilla and upper limb lymph nodes: Secondary | ICD-10-CM | POA: Diagnosis not present

## 2020-01-04 DIAGNOSIS — C9000 Multiple myeloma not having achieved remission: Secondary | ICD-10-CM

## 2020-01-04 DIAGNOSIS — I358 Other nonrheumatic aortic valve disorders: Secondary | ICD-10-CM | POA: Insufficient documentation

## 2020-01-04 DIAGNOSIS — R6884 Jaw pain: Secondary | ICD-10-CM | POA: Diagnosis not present

## 2020-01-04 DIAGNOSIS — I2510 Atherosclerotic heart disease of native coronary artery without angina pectoris: Secondary | ICD-10-CM | POA: Diagnosis not present

## 2020-01-04 DIAGNOSIS — F419 Anxiety disorder, unspecified: Secondary | ICD-10-CM | POA: Diagnosis not present

## 2020-01-04 DIAGNOSIS — Z79899 Other long term (current) drug therapy: Secondary | ICD-10-CM | POA: Diagnosis not present

## 2020-01-04 DIAGNOSIS — C779 Secondary and unspecified malignant neoplasm of lymph node, unspecified: Secondary | ICD-10-CM

## 2020-01-04 DIAGNOSIS — Z8674 Personal history of sudden cardiac arrest: Secondary | ICD-10-CM | POA: Insufficient documentation

## 2020-01-04 DIAGNOSIS — Z5112 Encounter for antineoplastic immunotherapy: Secondary | ICD-10-CM | POA: Diagnosis not present

## 2020-01-04 DIAGNOSIS — Z7982 Long term (current) use of aspirin: Secondary | ICD-10-CM | POA: Diagnosis not present

## 2020-01-04 DIAGNOSIS — E119 Type 2 diabetes mellitus without complications: Secondary | ICD-10-CM | POA: Diagnosis not present

## 2020-01-04 DIAGNOSIS — Z7984 Long term (current) use of oral hypoglycemic drugs: Secondary | ICD-10-CM | POA: Diagnosis not present

## 2020-01-04 LAB — CMP (CANCER CENTER ONLY)
ALT: 506 U/L (ref 0–44)
AST: 216 U/L (ref 15–41)
Albumin: 2.8 g/dL — ABNORMAL LOW (ref 3.5–5.0)
Alkaline Phosphatase: 205 U/L — ABNORMAL HIGH (ref 38–126)
Anion gap: 12 (ref 5–15)
BUN: 33 mg/dL — ABNORMAL HIGH (ref 8–23)
CO2: 19 mmol/L — ABNORMAL LOW (ref 22–32)
Calcium: 8.1 mg/dL — ABNORMAL LOW (ref 8.9–10.3)
Chloride: 99 mmol/L (ref 98–111)
Creatinine: 1.32 mg/dL — ABNORMAL HIGH (ref 0.61–1.24)
GFR, Est AFR Am: >60 mL/min (ref >60)
GFR, Estimated: 56 mL/min — ABNORMAL LOW (ref >60)
Glucose, Bld: 270 mg/dL — ABNORMAL HIGH (ref 70–99)
Potassium: 3.5 mmol/L (ref 3.5–5.1)
Sodium: 130 mmol/L — ABNORMAL LOW (ref 135–145)
Total Bilirubin: 0.9 mg/dL (ref 0.3–1.2)
Total Protein: 5.7 g/dL — ABNORMAL LOW (ref 6.5–8.1)

## 2020-01-04 LAB — CBC WITH DIFFERENTIAL (CANCER CENTER ONLY)
Abs Immature Granulocytes: 0.02 10*3/uL (ref 0.00–0.07)
Basophils Absolute: 0 10*3/uL (ref 0.0–0.1)
Basophils Relative: 0 %
Eosinophils Absolute: 0 10*3/uL (ref 0.0–0.5)
Eosinophils Relative: 0 %
HCT: 35.6 % — ABNORMAL LOW (ref 39.0–52.0)
Hemoglobin: 12 g/dL — ABNORMAL LOW (ref 13.0–17.0)
Immature Granulocytes: 1 %
Lymphocytes Relative: 14 %
Lymphs Abs: 0.5 10*3/uL — ABNORMAL LOW (ref 0.7–4.0)
MCH: 29.1 pg (ref 26.0–34.0)
MCHC: 33.7 g/dL (ref 30.0–36.0)
MCV: 86.4 fL (ref 80.0–100.0)
Monocytes Absolute: 0.3 10*3/uL (ref 0.1–1.0)
Monocytes Relative: 7 %
Neutro Abs: 2.9 10*3/uL (ref 1.7–7.7)
Neutrophils Relative %: 78 %
Platelet Count: 95 10*3/uL — ABNORMAL LOW (ref 150–400)
RBC: 4.12 MIL/uL — ABNORMAL LOW (ref 4.22–5.81)
RDW: 15.5 % (ref 11.5–15.5)
WBC Count: 3.7 10*3/uL — ABNORMAL LOW (ref 4.0–10.5)
nRBC: 0 % (ref 0.0–0.2)

## 2020-01-04 LAB — SAMPLE TO BLOOD BANK

## 2020-01-04 LAB — URINALYSIS, COMPLETE (UACMP) WITH MICROSCOPIC
Bilirubin Urine: NEGATIVE
Glucose, UA: >=500 mg/dL — AB
Ketones, ur: 20 mg/dL — AB
Leukocytes,Ua: NEGATIVE
Nitrite: NEGATIVE
Protein, ur: 30 mg/dL — AB
Specific Gravity, Urine: 1.025 (ref 1.005–1.030)
pH: 6 (ref 5.0–8.0)

## 2020-01-04 MED ORDER — SULFAMETHOXAZOLE-TRIMETHOPRIM 800-160 MG PO TABS: 1.0000 | ORAL_TABLET | Freq: Two times a day (BID) | ORAL | 0 refills | Status: DC

## 2020-01-04 MED ORDER — SODIUM CHLORIDE 0.9 % IV SOLN
Freq: Once | INTRAVENOUS | Status: AC
  Filled 2020-01-04: qty 250

## 2020-01-04 NOTE — Patient Instructions (Signed)

## 2020-01-04 NOTE — Telephone Encounter (Signed)
Called patient and made him aware that he has been scheduled for a lab appointment at 11:30 followed by a visit in the [facility]. Patient verbalized understanding and said he will have his sister transport him to the appointment.

## 2020-01-04 NOTE — Progress Notes (Signed)
1256- Critical labs received by phone: AST 216 ALT 506 PA Van aware  PA Lucianne Lei contacted pt's PCP who was expecting pt to come by [facility] and give urine sample, made aware that sample was taken here so they do not need to expect the pt to come by this afternoon.  Pt received 1L IVF NS, tolerated well.  Able to eat and drink during infusion w/out any issues.  VS improved & stable, PA Van aware and gave ok for pt to be d/c.  Pt reports feeling better w/resolved dizziness at end of tx.  Pt verbalized understanding to call as needed this weekend or early next week if dizziness returns or pt has questions/concerns.  Pt expressed desire to have MOST form and/or DNR form filled out by one of his physicians (MD [facility] or PCP) within the next week or two.  MD Alen Blew states he will review the MOST form for details and f/u with patient next week.  Pt aware to expect call from MD Shadad/his office regarding ADs.  Pt denies any more questions or concerns at time of d/c, aware to pick up oral abx prescription and to push oral fluids.

## 2020-01-04 NOTE — Telephone Encounter (Signed)
-----   Message from Wyatt Portela, MD sent at 01/04/2020 10:30 AM EST ----- Yes. Thanks ----- Message ----- From: Tami Lin, RN Sent: 01/04/2020   9:57 AM EST To: Wyatt Portela, MD  Patient called and stated he continues to feel worse after contacting you yesterday. He said he has dizziness, nausea, and hot flashes. He said his BP is running in the mid 90's according to his home machine. He said he just doesn't feel well. Would you for him to be seen in symptom management? Lanelle Bal

## 2020-01-05 LAB — URINE CULTURE: Culture: NO GROWTH

## 2020-01-07 ENCOUNTER — Emergency Department (HOSPITAL_COMMUNITY): Payer: Medicare HMO

## 2020-01-07 ENCOUNTER — Encounter (HOSPITAL_COMMUNITY): Payer: Self-pay | Admitting: Family Medicine

## 2020-01-07 ENCOUNTER — Encounter: Payer: Self-pay | Admitting: Family Medicine

## 2020-01-07 ENCOUNTER — Inpatient Hospital Stay (HOSPITAL_COMMUNITY)
Admission: EM | Admit: 2020-01-07 | Discharge: 2020-01-11 | DRG: 315 | Disposition: A | Discharge status: Home Health | Payer: Medicare HMO | Attending: Internal Medicine | Admitting: Internal Medicine

## 2020-01-07 ENCOUNTER — Inpatient Hospital Stay (HOSPITAL_BASED_OUTPATIENT_CLINIC_OR_DEPARTMENT_OTHER): Payer: Medicare HMO | Admitting: Medical

## 2020-01-07 ENCOUNTER — Other Ambulatory Visit: Payer: Self-pay | Admitting: Emergency Medicine

## 2020-01-07 ENCOUNTER — Telehealth: Payer: Self-pay | Admitting: Emergency Medicine

## 2020-01-07 ENCOUNTER — Ambulatory Visit (INDEPENDENT_AMBULATORY_CARE_PROVIDER_SITE_OTHER): Payer: Medicare HMO

## 2020-01-07 DIAGNOSIS — I5022 Chronic systolic (congestive) heart failure: Secondary | ICD-10-CM | POA: Diagnosis present

## 2020-01-07 DIAGNOSIS — I252 Old myocardial infarction: Secondary | ICD-10-CM

## 2020-01-07 DIAGNOSIS — Z833 Family history of diabetes mellitus: Secondary | ICD-10-CM

## 2020-01-07 DIAGNOSIS — C7931 Secondary malignant neoplasm of brain: Secondary | ICD-10-CM | POA: Diagnosis present

## 2020-01-07 DIAGNOSIS — Z20822 Contact with and (suspected) exposure to covid-19: Secondary | ICD-10-CM | POA: Diagnosis not present

## 2020-01-07 DIAGNOSIS — R410 Disorientation, unspecified: Secondary | ICD-10-CM | POA: Diagnosis not present

## 2020-01-07 DIAGNOSIS — E872 Acidosis: Secondary | ICD-10-CM | POA: Diagnosis present

## 2020-01-07 DIAGNOSIS — Z515 Encounter for palliative care: Secondary | ICD-10-CM | POA: Diagnosis not present

## 2020-01-07 DIAGNOSIS — Z820 Family history of epilepsy and other diseases of the nervous system: Secondary | ICD-10-CM | POA: Diagnosis not present

## 2020-01-07 DIAGNOSIS — Z87891 Personal history of nicotine dependence: Secondary | ICD-10-CM

## 2020-01-07 DIAGNOSIS — C779 Secondary and unspecified malignant neoplasm of lymph node, unspecified: Secondary | ICD-10-CM

## 2020-01-07 DIAGNOSIS — E876 Hypokalemia: Secondary | ICD-10-CM | POA: Diagnosis present

## 2020-01-07 DIAGNOSIS — R32 Unspecified urinary incontinence: Secondary | ICD-10-CM | POA: Diagnosis present

## 2020-01-07 DIAGNOSIS — C4361 Malignant melanoma of right upper limb, including shoulder: Secondary | ICD-10-CM | POA: Diagnosis present

## 2020-01-07 DIAGNOSIS — C7951 Secondary malignant neoplasm of bone: Secondary | ICD-10-CM | POA: Diagnosis present

## 2020-01-07 DIAGNOSIS — Z79899 Other long term (current) drug therapy: Secondary | ICD-10-CM

## 2020-01-07 DIAGNOSIS — R748 Abnormal levels of other serum enzymes: Secondary | ICD-10-CM | POA: Diagnosis present

## 2020-01-07 DIAGNOSIS — C439 Malignant melanoma of skin, unspecified: Secondary | ICD-10-CM | POA: Diagnosis not present

## 2020-01-07 DIAGNOSIS — E039 Hypothyroidism, unspecified: Secondary | ICD-10-CM | POA: Diagnosis present

## 2020-01-07 DIAGNOSIS — C7801 Secondary malignant neoplasm of right lung: Secondary | ICD-10-CM | POA: Diagnosis present

## 2020-01-07 DIAGNOSIS — Z66 Do not resuscitate: Secondary | ICD-10-CM | POA: Diagnosis present

## 2020-01-07 DIAGNOSIS — C7802 Secondary malignant neoplasm of left lung: Secondary | ICD-10-CM | POA: Diagnosis present

## 2020-01-07 DIAGNOSIS — E119 Type 2 diabetes mellitus without complications: Secondary | ICD-10-CM

## 2020-01-07 DIAGNOSIS — Z95810 Presence of automatic (implantable) cardiac defibrillator: Secondary | ICD-10-CM | POA: Diagnosis present

## 2020-01-07 DIAGNOSIS — Z8349 Family history of other endocrine, nutritional and metabolic diseases: Secondary | ICD-10-CM

## 2020-01-07 DIAGNOSIS — I950 Idiopathic hypotension: Secondary | ICD-10-CM

## 2020-01-07 DIAGNOSIS — I959 Hypotension, unspecified: Principal | ICD-10-CM | POA: Diagnosis present

## 2020-01-07 DIAGNOSIS — H539 Unspecified visual disturbance: Secondary | ICD-10-CM | POA: Diagnosis not present

## 2020-01-07 DIAGNOSIS — I447 Left bundle-branch block, unspecified: Secondary | ICD-10-CM | POA: Diagnosis not present

## 2020-01-07 DIAGNOSIS — R531 Weakness: Secondary | ICD-10-CM | POA: Diagnosis not present

## 2020-01-07 DIAGNOSIS — E1165 Type 2 diabetes mellitus with hyperglycemia: Secondary | ICD-10-CM

## 2020-01-07 DIAGNOSIS — Z9181 History of falling: Secondary | ICD-10-CM | POA: Diagnosis not present

## 2020-01-07 DIAGNOSIS — R42 Dizziness and giddiness: Secondary | ICD-10-CM | POA: Diagnosis not present

## 2020-01-07 DIAGNOSIS — W19XXXA Unspecified fall, initial encounter: Secondary | ICD-10-CM | POA: Diagnosis not present

## 2020-01-07 DIAGNOSIS — I428 Other cardiomyopathies: Secondary | ICD-10-CM

## 2020-01-07 DIAGNOSIS — R3129 Other microscopic hematuria: Secondary | ICD-10-CM

## 2020-01-07 DIAGNOSIS — Z7189 Other specified counseling: Secondary | ICD-10-CM | POA: Diagnosis not present

## 2020-01-07 DIAGNOSIS — C7970 Secondary malignant neoplasm of unspecified adrenal gland: Secondary | ICD-10-CM | POA: Diagnosis present

## 2020-01-07 DIAGNOSIS — Z7984 Long term (current) use of oral hypoglycemic drugs: Secondary | ICD-10-CM

## 2020-01-07 DIAGNOSIS — C786 Secondary malignant neoplasm of retroperitoneum and peritoneum: Secondary | ICD-10-CM | POA: Diagnosis not present

## 2020-01-07 DIAGNOSIS — I499 Cardiac arrhythmia, unspecified: Secondary | ICD-10-CM | POA: Diagnosis not present

## 2020-01-07 DIAGNOSIS — M2550 Pain in unspecified joint: Secondary | ICD-10-CM | POA: Diagnosis not present

## 2020-01-07 DIAGNOSIS — E871 Hypo-osmolality and hyponatremia: Secondary | ICD-10-CM | POA: Diagnosis present

## 2020-01-07 DIAGNOSIS — E2740 Unspecified adrenocortical insufficiency: Secondary | ICD-10-CM | POA: Diagnosis present

## 2020-01-07 DIAGNOSIS — Z7982 Long term (current) use of aspirin: Secondary | ICD-10-CM

## 2020-01-07 DIAGNOSIS — Z7401 Bed confinement status: Secondary | ICD-10-CM | POA: Diagnosis not present

## 2020-01-07 DIAGNOSIS — Z8674 Personal history of sudden cardiac arrest: Secondary | ICD-10-CM

## 2020-01-07 DIAGNOSIS — J80 Acute respiratory distress syndrome: Secondary | ICD-10-CM | POA: Diagnosis not present

## 2020-01-07 DIAGNOSIS — Z9221 Personal history of antineoplastic chemotherapy: Secondary | ICD-10-CM

## 2020-01-07 LAB — CBC WITH DIFFERENTIAL/PLATELET
Abs Immature Granulocytes: 0.04 10*3/uL (ref 0.00–0.07)
Basophils Absolute: 0 10*3/uL (ref 0.0–0.1)
Basophils Relative: 0 %
Eosinophils Absolute: 0 10*3/uL (ref 0.0–0.5)
Eosinophils Relative: 1 %
HCT: 30.6 % — ABNORMAL LOW (ref 39.0–52.0)
Hemoglobin: 10.2 g/dL — ABNORMAL LOW (ref 13.0–17.0)
Immature Granulocytes: 1 %
Lymphocytes Relative: 13 %
Lymphs Abs: 0.6 10*3/uL — ABNORMAL LOW (ref 0.7–4.0)
MCH: 29.1 pg (ref 26.0–34.0)
MCHC: 33.3 g/dL (ref 30.0–36.0)
MCV: 87.4 fL (ref 80.0–100.0)
Monocytes Absolute: 0.3 10*3/uL (ref 0.1–1.0)
Monocytes Relative: 5 %
Neutro Abs: 4 10*3/uL (ref 1.7–7.7)
Neutrophils Relative %: 80 %
Platelets: 137 10*3/uL — ABNORMAL LOW (ref 150–400)
RBC: 3.5 MIL/uL — ABNORMAL LOW (ref 4.22–5.81)
RDW: 15.9 % — ABNORMAL HIGH (ref 11.5–15.5)
WBC: 5 10*3/uL (ref 4.0–10.5)
nRBC: 0 % (ref 0.0–0.2)

## 2020-01-07 LAB — HEPATITIS PANEL, ACUTE
HCV Ab: NONREACTIVE
Hep A IgM: NONREACTIVE
Hep B C IgM: NONREACTIVE
Hepatitis B Surface Ag: NONREACTIVE

## 2020-01-07 LAB — URINALYSIS, ROUTINE W REFLEX MICROSCOPIC
Bacteria, UA: NONE SEEN
Bilirubin Urine: NEGATIVE
Glucose, UA: >=500 mg/dL — AB
Hgb urine dipstick: NEGATIVE
Ketones, ur: 5 mg/dL — AB
Leukocytes,Ua: NEGATIVE
Nitrite: NEGATIVE
Protein, ur: 30 mg/dL — AB
Specific Gravity, Urine: 1.024 (ref 1.005–1.030)
pH: 6 (ref 5.0–8.0)

## 2020-01-07 LAB — LACTIC ACID, PLASMA: Lactic Acid, Venous: 1.3 mmol/L (ref 0.5–1.9)

## 2020-01-07 LAB — AMMONIA: Ammonia: 14 umol/L (ref 9–35)

## 2020-01-07 LAB — COMPREHENSIVE METABOLIC PANEL
ALT: 193 U/L — ABNORMAL HIGH (ref 0–44)
AST: 59 U/L — ABNORMAL HIGH (ref 15–41)
Albumin: 2.6 g/dL — ABNORMAL LOW (ref 3.5–5.0)
Alkaline Phosphatase: 153 U/L — ABNORMAL HIGH (ref 38–126)
Anion gap: 9 (ref 5–15)
BUN: 27 mg/dL — ABNORMAL HIGH (ref 8–23)
CO2: 18 mmol/L — ABNORMAL LOW (ref 22–32)
Calcium: 8.2 mg/dL — ABNORMAL LOW (ref 8.9–10.3)
Chloride: 102 mmol/L (ref 98–111)
Creatinine, Ser: 1.29 mg/dL — ABNORMAL HIGH (ref 0.61–1.24)
GFR calc Af Amer: >60 mL/min (ref >60)
GFR calc non Af Amer: 57 mL/min — ABNORMAL LOW (ref >60)
Glucose, Bld: 153 mg/dL — ABNORMAL HIGH (ref 70–99)
Potassium: 3.2 mmol/L — ABNORMAL LOW (ref 3.5–5.1)
Sodium: 129 mmol/L — ABNORMAL LOW (ref 135–145)
Total Bilirubin: 0.8 mg/dL (ref 0.3–1.2)
Total Protein: 5.4 g/dL — ABNORMAL LOW (ref 6.5–8.1)

## 2020-01-07 LAB — PROTIME-INR
INR: 1 (ref 0.8–1.2)
Prothrombin Time: 13.4 seconds (ref 11.4–15.2)

## 2020-01-07 LAB — MAGNESIUM: Magnesium: 2.4 mg/dL (ref 1.7–2.4)

## 2020-01-07 LAB — RESPIRATORY PANEL BY RT PCR (FLU A&B, COVID)
Influenza A by PCR: NEGATIVE
Influenza B by PCR: NEGATIVE
SARS Coronavirus 2 by RT PCR: NEGATIVE

## 2020-01-07 LAB — BRAIN NATRIURETIC PEPTIDE: B Natriuretic Peptide: 367.2 pg/mL — ABNORMAL HIGH (ref 0.0–100.0)

## 2020-01-07 LAB — LIPASE, BLOOD: Lipase: 285 U/L — ABNORMAL HIGH (ref 11–51)

## 2020-01-07 LAB — GLUCOSE, CAPILLARY: Glucose-Capillary: 217 mg/dL — ABNORMAL HIGH (ref 70–99)

## 2020-01-07 LAB — TSH: TSH: 3.722 u[IU]/mL (ref 0.350–4.500)

## 2020-01-07 MED ORDER — DABRAFENIB MESYLATE 75 MG PO CAPS: 150.0000 mg | ORAL_CAPSULE | Freq: Two times a day (BID) | ORAL | Status: DC

## 2020-01-07 MED ORDER — SODIUM CHLORIDE 0.9% FLUSH: 3.0000 mL | INTRAVENOUS | Status: DC | PRN

## 2020-01-07 MED ORDER — ACETAMINOPHEN 325 MG PO TABS
650.0000 mg | ORAL_TABLET | Freq: Four times a day (QID) | ORAL | Status: DC | PRN
  Administered 2020-01-07: 650 mg via ORAL
  Filled 2020-01-07: qty 2

## 2020-01-07 MED ORDER — TAMSULOSIN HCL 0.4 MG PO CAPS
0.4000 mg | ORAL_CAPSULE | Freq: Every day | ORAL | Status: DC
  Filled 2020-01-07: qty 1

## 2020-01-07 MED ORDER — VANCOMYCIN HCL 1250 MG/250ML IV SOLN
1250.0000 mg | INTRAVENOUS | Status: DC
  Administered 2020-01-07: 23:00:00 1250 mg via INTRAVENOUS
  Filled 2020-01-07: qty 250

## 2020-01-07 MED ORDER — DEXAMETHASONE SODIUM PHOSPHATE 4 MG/ML IJ SOLN
4.0000 mg | Freq: Four times a day (QID) | INTRAMUSCULAR | Status: DC
  Administered 2020-01-07 – 2020-01-08 (×2): 4 mg via INTRAVENOUS
  Filled 2020-01-07 (×2): qty 1

## 2020-01-07 MED ORDER — LORAZEPAM 2 MG/ML IJ SOLN
0.5000 mg | INTRAMUSCULAR | Status: DC | PRN
  Administered 2020-01-07: 23:00:00 0.5 mg via INTRAVENOUS
  Filled 2020-01-07: qty 1

## 2020-01-07 MED ORDER — INSULIN ASPART 100 UNIT/ML ~~LOC~~ SOLN
0.0000 [IU] | Freq: Three times a day (TID) | SUBCUTANEOUS | Status: DC
  Administered 2020-01-08: 2 [IU] via SUBCUTANEOUS
  Filled 2020-01-07: qty 0.15

## 2020-01-07 MED ORDER — METHIMAZOLE 10 MG PO TABS
10.0000 mg | ORAL_TABLET | Freq: Every day | ORAL | Status: DC
  Filled 2020-01-07: qty 1

## 2020-01-07 MED ORDER — ASPIRIN EC 81 MG PO TBEC
81.0000 mg | DELAYED_RELEASE_TABLET | Freq: Every day | ORAL | Status: DC
  Administered 2020-01-07: 23:00:00 81 mg via ORAL
  Filled 2020-01-07 (×2): qty 1

## 2020-01-07 MED ORDER — ENOXAPARIN SODIUM 40 MG/0.4ML ~~LOC~~ SOLN
40.0000 mg | SUBCUTANEOUS | Status: DC
  Administered 2020-01-07: 23:00:00 40 mg via SUBCUTANEOUS
  Filled 2020-01-07: qty 0.4

## 2020-01-07 MED ORDER — LORAZEPAM 2 MG/ML IJ SOLN: 2.0000 mg | Freq: Once | INTRAMUSCULAR | Status: DC | PRN

## 2020-01-07 MED ORDER — SODIUM CHLORIDE 0.9 % IV SOLN: 250.0000 mL | INTRAVENOUS | Status: DC | PRN

## 2020-01-07 MED ORDER — LACTATED RINGERS IV BOLUS: 1000.0000 mL | Freq: Once | INTRAVENOUS | Status: DC

## 2020-01-07 MED ORDER — POTASSIUM CHLORIDE CRYS ER 20 MEQ PO TBCR
40.0000 meq | EXTENDED_RELEASE_TABLET | Freq: Once | ORAL | Status: AC
  Administered 2020-01-07: 40 meq via ORAL
  Filled 2020-01-07: qty 2

## 2020-01-07 MED ORDER — SODIUM CHLORIDE 0.9% FLUSH
3.0000 mL | Freq: Two times a day (BID) | INTRAVENOUS | Status: DC
  Administered 2020-01-07 – 2020-01-11 (×8): 3 mL via INTRAVENOUS

## 2020-01-07 MED ORDER — MEXILETINE HCL 150 MG PO CAPS
150.0000 mg | ORAL_CAPSULE | Freq: Three times a day (TID) | ORAL | Status: DC
  Administered 2020-01-08: 150 mg via ORAL
  Filled 2020-01-07 (×3): qty 1

## 2020-01-07 MED ORDER — IPRATROPIUM BROMIDE 0.06 % NA SOLN
2.0000 | Freq: Three times a day (TID) | NASAL | Status: DC
  Filled 2020-01-07: qty 15

## 2020-01-07 MED ORDER — TRAMETINIB DIMETHYL SULFOXIDE 2 MG PO TABS: 2.0000 mg | ORAL_TABLET | Freq: Every day | ORAL | Status: DC

## 2020-01-07 MED ORDER — CANAGLIFLOZIN 100 MG PO TABS
100.0000 mg | ORAL_TABLET | Freq: Every day | ORAL | Status: DC
  Filled 2020-01-07: qty 1

## 2020-01-07 MED ORDER — ROSUVASTATIN CALCIUM 10 MG PO TABS
10.0000 mg | ORAL_TABLET | Freq: Every day | ORAL | Status: DC
  Administered 2020-01-07: 10 mg via ORAL
  Filled 2020-01-07 (×2): qty 1

## 2020-01-07 MED ORDER — SODIUM CHLORIDE 0.9 % IV SOLN
2.0000 g | Freq: Two times a day (BID) | INTRAVENOUS | Status: DC
  Administered 2020-01-08: 2 g via INTRAVENOUS
  Filled 2020-01-07 (×3): qty 2

## 2020-01-07 MED ORDER — MORPHINE SULFATE (CONCENTRATE) 10 MG/0.5ML PO SOLN: 10.0000 mg | ORAL | Status: DC | PRN

## 2020-01-07 MED ORDER — ONDANSETRON HCL 4 MG PO TABS: 4.0000 mg | ORAL_TABLET | Freq: Four times a day (QID) | ORAL | Status: DC | PRN

## 2020-01-07 MED ORDER — SODIUM CHLORIDE 0.9 % IV BOLUS
1000.0000 mL | Freq: Once | INTRAVENOUS | Status: AC
  Administered 2020-01-07: 1000 mL via INTRAVENOUS

## 2020-01-07 MED ORDER — SODIUM CHLORIDE 0.9 % IV SOLN: INTRAVENOUS | Status: DC

## 2020-01-07 MED ORDER — ACETAMINOPHEN 650 MG RE SUPP: 650.0000 mg | Freq: Four times a day (QID) | RECTAL | Status: DC | PRN

## 2020-01-07 MED ORDER — ONDANSETRON HCL 4 MG/2ML IJ SOLN: 4.0000 mg | Freq: Four times a day (QID) | INTRAMUSCULAR | Status: DC | PRN

## 2020-01-07 MED ORDER — SODIUM CHLORIDE 0.9 % IV BOLUS
1000.0000 mL | Freq: Once | INTRAVENOUS | Status: AC
  Administered 2020-01-07: 14:00:00 1000 mL via INTRAVENOUS

## 2020-01-07 MED ORDER — DEXAMETHASONE SODIUM PHOSPHATE 4 MG/ML IJ SOLN
4.0000 mg | Freq: Four times a day (QID) | INTRAMUSCULAR | Status: DC
  Administered 2020-01-07: 4 mg via INTRAVENOUS
  Filled 2020-01-07: qty 1

## 2020-01-07 MED ORDER — LORAZEPAM 2 MG/ML IJ SOLN: 2.0000 mg | INTRAMUSCULAR | Status: DC | PRN

## 2020-01-07 MED ORDER — MORPHINE SULFATE (CONCENTRATE) 10 MG/0.5ML PO SOLN
10.0000 mg | ORAL | Status: DC | PRN
  Administered 2020-01-09: 04:00:00 10 mg via ORAL
  Filled 2020-01-07: qty 0.5

## 2020-01-07 NOTE — H&P (Signed)
History and Physical    Lee Hall KGY:[id number] DOB: October 25, 1951 DOA: 01/07/2020  PCP: [name], MD    Patient coming from: Home    Chief Complaint: General weakness, unable to bear his weight, dizziness lightheadedness, urinary incontinence  HPI: Lee Hall is a 69 y.o. male with medical history significant of myeloma with lymphatic nodes metastasis, congestive heart failure, history of V. tach, defibrillator, diabetes mellitus type 2, came with a chief complaint of lightheadedness weakness, urinary incontinence.  Patient was seen by oncology doctor  for weakness, unable to bear weight and was sent to emergency room for evaluation.  Few days ago he was seen by primary doctor was found to have a UTI and was started on Bactrim. Patient is treated by oncologist Dr. Alen Blew Current therapy: Mekinist 2 mg PO QDay and Tafinlar 75 mg, 2 capsules PO BID  ED Course:  In the emergency room he was found to be hypotensive systolic blood pressure 65 and was given 3 L of fluids was consulted critical care for possible pressure.  Critical care states patient does not need intensive care unit at this point.  Patient wants to be DNR. Palliative care was consulted as well for possible transition to hospice CT scan of the head showed metastasis to the brain started on Decadron 6 mg every 6h and Ativan 2 mg IV as needed for seizures some blood work show sodium 129 potassium 3.2 chloride 102 BUN 27 creatinine 1.29 lipase 285, mild elevated liver enzymes BNP 367.  Lactic acid 1.3 white count 5.0 hemoglobin 10.2 platelets 137 CT scan of the head New ovoid areas of increased attenuation deep white matter in the left parietal region and subcortical area of the right temporal lobe suspicious for metastatic lesions. MRI of the brain with and without contrast is recommended when clinically able.  Review of Systems: As per HPI otherwise 10 point review of systems negative.  With  exception of general weakness urinary incontinence back pain thought to ambulate dizziness hallucination  Past Medical History:  Diagnosis Date  . Anxiety   . CAD (coronary artery disease)   . Cardiac arrest ([place])   . Diabetes mellitus type II, controlled ([place])   . Heart failure, systolic, acute on chronic ([place])   . ICD (implantable cardiac defibrillator), single, in situ   . ISCHEMIC CARDIOMYOPATHY 03/19/2009   Qualifier: Diagnosis of  By: Boyce Medici RN, BSN, [place]    . LEFT VENTRICULAR MURAL THROMBUS 02/18/2009   Qualifier: Diagnosis of  By: Stevie Kern NP-C, Sharyn Lull    . Melanoma ([place]) 2019  . MI (myocardial infarction) ([place]) 08/2003  . PAROXYSMAL VENTRICULAR TACHYCARDIA 03/19/2009   Qualifier: Diagnosis of  By: Boyce Medici RN, BSN, [place]    . Pneumonia 02/08/2019  . SYSTOLIC HEART FAILURE, ACUTE ON CHRONIC 01/02/2009   Qualifier: Diagnosis of  By: Stevie Kern NP-C, Sharyn Lull    . Ventricular tachycardia [facility])     Past Surgical History:  Procedure Laterality Date  . ABLATION  03/2018   IN [place]  . AXILLARY LYMPH NODE DISSECTION Right 10/05/2018   RIGHT AXILLARY LYMPH NODE DISSECTION   . AXILLARY LYMPH NODE DISSECTION Right 10/05/2018   Procedure: RIGHT AXILLARY LYMPH NODE DISSECTION;  Surgeon: Stark Klein, MD;  Location: [place];  Service: General;  Laterality: Right;  . CARDIAC CATHETERIZATION     01/30/18 [facility]): Occluded LAD. Patent LCX and RCA. Normal LVEDP 14 mmHg. Medical managment.  Marland Kitchen CATARACT EXTRACTION    . INSERT / REPLACE / REMOVE  PACEMAKER    . SKIN BIOPSY  2019   melanoma R shoulder  . TONSILLECTOMY AND ADENOIDECTOMY  1969     reports that he quit smoking about 15 years ago. His smoking use included cigars and cigarettes. He has a 20.00 pack-year smoking history. He has never used smokeless tobacco. He reports previous alcohol use. He reports that he does not use drugs.  No Known Allergies  Family History  Problem Relation Age of Onset  . Alzheimer's disease  Mother   . Thyroid disease Mother        uncertain type  . Kidney Stones Father   . Diabetes Father   . Alcohol abuse Father   . Alzheimer's disease Maternal Grandmother      Prior to Admission medications   Medication Sig Start Date End Date Taking? Authorizing Provider  acyclovir (ZOVIRAX) 800 MG tablet Take 1 tablet (800 mg total) by mouth 3 (three) times daily. Use for 4-6 days at onset of symptoms Patient taking differently: Take 2,400 mg by mouth once a week. Use for 4-6 days at onset of symptoms 10/29/19  Yes [name], Junell C, MD  amiodarone (PACERONE) 200 MG tablet Take 200 mg by mouth daily.   Yes [provider]  aspirin 81 MG tablet Take 81 mg by mouth daily.     Yes [provider]  carvedilol (COREG) 3.125 MG tablet Take 1.5625 mg by mouth 2 (two) times daily. Take 0.5 tablet (1.5625) BID 09/11/19  Yes [provider]  empagliflozin (JARDIANCE) 10 MG TABS tablet Take 10 mg by mouth daily. 05/04/19  Yes [name], Junell C, MD  ipratropium (ATROVENT) 0.06 % nasal spray USE 2 SPRAYS INTO BOTH NOSTRILS 3 (THREE) TIMES DAILY. Patient taking differently: Place 2 sprays into both nostrils 3 (three) times daily.  07/05/19  Yes [name], Junell C, MD  metFORMIN (GLUCOPHAGE-XR) 500 MG 24 hr tablet TAKE 2 TABLETS (1,000 MG TOTAL) BY MOUTH AT BEDTIME. Patient taking differently: Take 500 mg by mouth 2 (two) times daily.  09/18/19  Yes [name], [name], MD  methimazole (TAPAZOLE) 10 MG tablet Take 1 tablet (10 mg total) by mouth daily. 11/15/19  Yes Renato Shin, MD  mexiletine (MEXITIL) 150 MG capsule Take 1 capsule (150 mg total) by mouth 3 (three) times daily. 06/19/19  Yes Evans Lance, MD  sacubitril-valsartan (ENTRESTO) 24-26 MG Take 1 tablet by mouth 2 (two) times daily. 06/19/19  Yes Evans Lance, MD  sulfamethoxazole-trimethoprim (BACTRIM DS) 800-160 MG tablet Take 1 tablet by mouth 2 (two) times daily. 01/04/20  Yes Tanner, Lyndon Code., PA-C    tamsulosin (FLOMAX) 0.4 MG CAPS capsule Take 1 capsule (0.4 mg total) by mouth daily. 12/19/19  Yes [name], [name], MD  blood glucose meter kit and supplies Dispense based on patient and insurance preference. Use up to four times daily as directed. (FOR ICD-10 E10.9, E11.9). 02/14/19   Hongalgi, Lenis Dickinson, MD  MEKINIST 2 MG tablet TAKE 1 TABLET (2 MG TOTAL) BY MOUTH DAILY. TAKE 1 HOUR BEFORE OR 2 HOURS AFTER A MEAL. STORE REFRIGERATED IN ORIGINAL CONTAINER. Patient taking differently: Take 2 mg by mouth daily.  01/02/20   Wyatt Portela, MD  rosuvastatin (CRESTOR) 10 MG tablet Take 1 tablet (10 mg total) by mouth daily. 06/19/19 09/17/19  Evans Lance, MD  TAFINLAR 75 MG capsule TAKE 2 CAPSULES (150 MG TOTAL) BY MOUTH 2 (TWO) TIMES DAILY. TAKE ON AN EMPTY STOMACH 1 HOUR BEFORE OR 2 HOURS AFTER  MEALS. Patient taking differently: Take 150 mg by mouth 2 (two) times daily.  01/02/20   Wyatt Portela, MD    Physical Exam: Vitals:   01/07/20 1900 01/07/20 1930 01/07/20 1945 01/07/20 2000  BP: (!) 88/59 (!) 95/58  (!) 85/60  Pulse: (!) 108 (!) 105 (!) 101 (!) 102  Resp: (!) 25 (!) 25 20 (!) 21  Temp:      TempSrc:      SpO2: 92% 90% (!) 89% 91%  Weight:      Height:        Constitutional: NAD, calm, comfortable Vitals:   01/07/20 1900 01/07/20 1930 01/07/20 1945 01/07/20 2000  BP: (!) 88/59 (!) 95/58  (!) 85/60  Pulse: (!) 108 (!) 105 (!) 101 (!) 102  Resp: (!) 25 (!) 25 20 (!) 21  Temp:      TempSrc:      SpO2: 92% 90% (!) 89% 91%  Weight:      Height:       Eyes: PERRL, lids and conjunctivae normal ENMT: Mucous membranes are moist. Posterior pharynx clear of any exudate or lesions.Normal dentition.  Neck: normal, supple, no masses, no thyromegaly Respiratory: clear to auscultation bilaterally, no wheezing, no crackles. Normal respiratory effort. No accessory muscle use.  Cardiovascular: Regular rate and rhythm, no murmurs / rubs / gallops. No extremity edema. 2+ pedal pulses. No  carotid bruits.  Abdomen: no tenderness, no masses palpated. No hepatosplenomegaly. Bowel sounds positive.  Musculoskeletal: no clubbing / cyanosis. No joint deformity upper and lower extremities. Good ROM, no contractures. Normal muscle tone.  Skin: no rashes, lesions, ulcers. No induration Neurologic: CN 2-12 grossly intact. Sensation intact, DTR normal. Strength 5/5 in all 4.  Psychiatric: Normal judgment and insight. Alert and oriented x 3. Normal mood.    Labs on Admission: I have personally reviewed following labs and imaging studies  CBC: Recent Labs  Lab 01/04/20 1206 01/07/20 1245  WBC 3.7* 5.0  NEUTROABS 2.9 4.0  HGB 12.0* 10.2*  HCT 35.6* 30.6*  MCV 86.4 87.4  PLT 95* 124*   Basic Metabolic Panel: Recent Labs  Lab 01/04/20 1206 01/07/20 1245  NA 130* 129*  K 3.5 3.2*  CL 99 102  CO2 19* 18*  GLUCOSE 270* 153*  BUN 33* 27*  CREATININE 1.32* 1.29*  CALCIUM 8.1* 8.2*  MG  --  2.4   GFR: Estimated Creatinine Clearance: 53.1 mL/min (A) (by C-G formula based on SCr of 1.29 mg/dL (H)). Liver Function Tests: Recent Labs  Lab 01/04/20 1206 01/07/20 1245  AST 216* 59*  ALT 506* 193*  ALKPHOS 205* 153*  BILITOT 0.9 0.8  PROT 5.7* 5.4*  ALBUMIN 2.8* 2.6*   Recent Labs  Lab 01/07/20 1245  LIPASE 285*   Recent Labs  Lab 01/07/20 1246  AMMONIA 14   Coagulation Profile: Recent Labs  Lab 01/07/20 1245  INR 1.0   Cardiac Enzymes: No results for input(s): CKTOTAL, CKMB, CKMBINDEX, TROPONINI in the last 168 hours. BNP (last 3 results) Recent Labs    01/31/19 1628 02/22/19 1006  PROBNP 1,698.0* 2,169*   HbA1C: No results for input(s): HGBA1C in the last 72 hours. CBG: No results for input(s): GLUCAP in the last 168 hours. Lipid Profile: No results for input(s): CHOL, HDL, LDLCALC, TRIG, CHOLHDL, LDLDIRECT in the last 72 hours. Thyroid Function Tests: Recent Labs    01/07/20 1246  TSH 3.722   Anemia Panel: No results for input(s):  VITAMINB12, FOLATE, FERRITIN, TIBC, IRON, RETICCTPCT  in the last 72 hours. Urine analysis:    Component Value Date/Time   COLORURINE YELLOW 01/07/2020 [place] 01/07/2020 1245   LABSPEC 1.024 01/07/2020 1245   PHURINE 6.0 01/07/2020 1245   GLUCOSEU >=500 (A) 01/07/2020 1245   HGBUR NEGATIVE 01/07/2020 1245   BILIRUBINUR NEGATIVE 01/07/2020 1245   BILIRUBINUR negative 12/14/2019 1647   KETONESUR 5 (A) 01/07/2020 1245   PROTEINUR 30 (A) 01/07/2020 1245   UROBILINOGEN 1.0 12/14/2019 1647   NITRITE NEGATIVE 01/07/2020 1245   LEUKOCYTESUR NEGATIVE 01/07/2020 1245    Radiological Exams on Admission: CT Head Wo Contrast  Addendum Date: 01/07/2020   ADDENDUM REPORT: 01/07/2020 16:56 ADDENDUM: Critical Value/emergent results were called by telephone at the time of interpretation on 01/07/2020 at 4:50 pm to Dr. Marda Stalker , who verbally acknowledged these results. Electronically Signed   By: Inez Catalina M.D.   On: 01/07/2020 16:56   Result Date: 01/07/2020 CLINICAL DATA:  Visual difficulties with dizziness, history of metastatic melanoma EXAM: CT HEAD WITHOUT CONTRAST TECHNIQUE: Contiguous axial images were obtained from the base of the skull through the vertex without intravenous contrast. COMPARISON:  05/03/2019 FINDINGS: Brain: No findings to suggest acute hemorrhage are identified. In the deep white matter on the left as well as within the subcortical area of the right temporal lobe region, there are ovoid appearing hyperdensities identified which were not seen on the prior exam. Some minimal surrounding edema is noted in the lesion on the left. No other definitive hyperdensities are seen. Vascular: No hyperdense vessel or unexpected calcification. Skull: Normal. Negative for fracture or focal lesion. Sinuses/Orbits: No acute finding. Other: None. IMPRESSION: New ovoid areas of increased attenuation deep white matter in the left parietal region and subcortical area of the  right temporal lobe suspicious for metastatic lesions. MRI of the brain with and without contrast is recommended when clinically able. Given the patient's pacing device, this may need coordination and scheduling with Cardiology if possible. Electronically Signed: By: Inez Catalina M.D. On: 01/07/2020 16:53   DG Chest Portable 1 View  Result Date: 01/07/2020 CLINICAL DATA:  History of metastatic melanoma with generalized weakness for 1 week. EXAM: PORTABLE CHEST 1 VIEW COMPARISON:  09/26/2019 FINDINGS: The cardiac silhouette, mediastinal and hilar contours are within normal limits and stable. The pacer wires/AICD are unchanged. No complicating features. The lungs are clear. No infiltrates, edema or effusions. No pneumothorax. The bony thorax is intact. IMPRESSION: No acute cardiopulmonary findings. Electronically Signed   By: Marijo Sanes M.D.   On: 01/07/2020 14:12    EKG: Independently reviewed.   Assessment and plan  Hypotension with leg weakness and urinary incontinence Patient with a chief complaint severe fatigue, weakness, dizziness lightheadedness urinary incontinence. Assessment and plan IV fluids, hold Coreg and amiodarone tonight, start empirically on antibiotics for possible sepsis We can check a cortisol level in a.m. 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 MRI of the brain with and without contrast is recommended when clinically able Plan Decadron 6 mg every 6h, seizure precautions, Ativan IV as needed We may start medication for seizures empirically like [place]  ED NOTE Spoke to oncology  initially to  discuss work-up direction and management plan.  Spoke with Dr. Marin Olp who is concerned that patient may now have metastatic disease to his brain and back causing his symptoms.  With the worsening liver function last week, he agrees with getting repeat labs, giving fluids, and getting MRI with and without contrast of the brain, T-spine, and L-spine.  We will get a CT initially given the falls.  He agrees with admission to medicine when work-up is completed.  They will see the patient in consultation as well. I discussed the findings with the patient.  He does want DNR.  He does not want MRI.  He would like to go home as soon as he is better.  He is getting IV fluid right now.  Clinically he is perfusing his brain fine and the lactic acid looks okay, so he is compensating okay at this time despite the low blood pressure.  We will admit to medicine  Nonischemic cardiomyopathy History of V. Tach On amiodarone and Coreg on hold tonight for low blood pressure Status post defibrillator  Diabetes mellitus type 2 Insulin sliding scale per sensitivity factor  Assessment/Plan Active Problems:   Nonischemic cardiomyopathy (HCC)   Chronic systolic heart failure (HCC)   Implantable cardioverter-defibrillator (ICD) in situ   Melanoma of skin (HCC)   Malignant melanoma metastatic to lymph node (HCC)   Diabetes mellitus type II, controlled ([place])   Hypotension      DVT prophylaxis: Lovenox Code Status: DNR per family wish not sure if they signed the paper it to be checked in the morning Family Communication: Need to communicate in the morning Disposition Plan: Hospice Consults called: Palliative care Admission status: Full admission   Elberta Lachapelle G Felicity Penix MD [facility]  If 7PM-7AM, please contact night-coverage [email or web address]   01/07/2020, 8:37 PM

## 2020-01-07 NOTE — ED Provider Notes (Addendum)
  Physical Exam  BP (!) 85/74   Pulse 85   Temp (!) 96.1 F (35.6 C) (Axillary)   Resp 15   Ht 6' (1.829 m)   Wt 66.7 kg   SpO2 99%   BMI 19.94 kg/m   Physical Exam  ED Course/Procedures     .Critical Care Performed by: Varney Biles, MD Authorized by: Varney Biles, MD   Critical care provider statement:    Critical care time (minutes):  15   Critical care was necessary to treat or prevent imminent or life-threatening deterioration of the following conditions:  Circulatory failure   Critical care was time spent personally by me on the following activities:  Discussions with consultants, evaluation of patient's response to treatment, examination of patient, ordering and performing treatments and interventions, ordering and review of laboratory studies, ordering and review of radiographic studies, pulse oximetry, re-evaluation of patient's condition, obtaining history from patient or surrogate and review of old charts    MDM    Assuming care of patient from Dr. Sherry Ruffing.   Patient in the ED for visual hallucinations, dizziness, urinary incontinence. Workup thus far shows CT head with mets to the brain. Labs have non specific changes (LFT elevation lipase elevation). Lactate is normal. No concerns for Sepsis.  Concerning findings are as following : Ct head with mets to the brain. Pt is hypotensive despite fluids. He will need MRI to eval for mets to spine/brain, however, pt is refusing MRI.   Important pending results are: Critical care evaluation.  According to Dr. Gustavus Messing, plan is to admit to [facility] for further workup.   Patient had no complains, no concerns from the nursing side. Will continue to monitor.   Varney Biles, MD 01/07/20 1734  6:14 PM Critical care has seen the patient.  They recommend that patient be admitted with IV hydration.  Patient does not want pressor support.  Palliative care consult be placed, as patient prefers going home.  I discussed the  findings with the patient.  He does want DNR.  He does not want MRI.  He would like to go home as soon as he is better.  He is getting IV fluid right now.  Clinically he is perfusing his brain fine and the lactic acid looks okay, so he is compensating okay at this time despite the low blood pressure.  We will admit to medicine.       Varney Biles, MD 01/07/20 2009

## 2020-01-07 NOTE — ED Notes (Signed)
Urinal at bedside.  

## 2020-01-07 NOTE — ED Notes (Signed)
Per Ovid Curd in MRI, patient has a pacemaker and will need to be scanned at [facility], [facility] is unable to scan individuals with pacemakers

## 2020-01-07 NOTE — ED Triage Notes (Signed)
Patient is from home and transported via [facility]. Patient has metastatic melanoma who is experiencing generalized weakness x 1 wk with visual hallucinations of dark spots. Also, over the weekend, he has fell over the weekend x 2 and has lost control of his bladder. EMS states patient has a MOST form, but it has not been notarized.

## 2020-01-07 NOTE — Progress Notes (Signed)
Pharmacy Antibiotic Note  Lee Hall is a 69 y.o. male admitted on 01/07/2020 with sepsis.  PMH significant for metastatic melanoma, pacemaker Pharmacy has been consulted for Vancomycin and Cefepime dosing.  Plan:  Cefepime 2gm IV q12h Vancomycin 1250 mg IV Q 24 hrs. Goal AUC 400-550. Expected AUC: 536;  SCr used: 1.29 Follow culture results and sensitivities   Height: 6' (182.9 cm) Weight: 147 lb (66.7 kg) IBW/kg (Calculated) : 77.6  Temp (24hrs), Avg:96.1 F (35.6 C), Min:96.1 F (35.6 C), Max:96.1 F (35.6 C)  Recent Labs  Lab 01/04/20 1206 01/07/20 1245  WBC 3.7* 5.0  CREATININE 1.32* 1.29*  LATICACIDVEN  --  1.3    Estimated Creatinine Clearance: 53.1 mL/min (A) (by C-G formula based on SCr of 1.29 mg/dL (H)).    No Known Allergies  Antimicrobials this admission: 2/8 Vanc >>   2/8 Cefepime >>    Dose adjustments this admission:    Microbiology results: 2/8 BCx:   2/8 UCx:    2/8 Covid: negative 2/8 Influenza A: negative 2/8 Influenza B: negative  Thank you for allowing pharmacy to be a part of this patient's care.  Everette Rank, PharmD 01/07/2020 8:53 PM

## 2020-01-07 NOTE — Consult Note (Signed)
Name: Lee Hall MRN: [id number] DOB: 1950/12/19    ADMISSION DATE:  01/07/2020 CONSULTATION DATE:  01/07/2020  REFERRING MD :  Tegeler, EDP  CHIEF COMPLAINT: Hypotension  BRIEF PATIENT DESCRIPTION: 69 year old with nonischemic cardiomyopathy and metastatic melanoma admitted with hypotension  SIGNIFICANT EVENTS    STUDIES:  2/8 head CT - New ovoid areas of increased attenuation deep white matter in the left parietal region and subcortical area of the right temporal lobe suspicious for metastatic lesions   12/23 CT chest/abdomen  Multiple small bilateral pulmonary metastases, measuring up to 9 mm in the left lower lobe, new.  Bilateral adrenal metastases, measuring up to 2.5 cm on the left, new.  Widespread peritoneal implants in the abdomen/pelvis, measuring up to 2.5 cm, new.  Stable postsurgical changes in the right axilla/subpectoral region.  Stable sclerotic metastasis in the left L1 vertebral body.    HISTORY OF PRESENT ILLNESS: 69 year old man with metastatic melanoma and nonischemic cardiomyopathy.  He has an ICD.  Amiodarone was stopped a few weeks ago due to interaction with his chemotherapy I have reviewed prior cardiology and oncology notes. Oncology visit was 2/5 -he was being treated with Mekinist 2 mg PO QDay and Tafinlar 75 mg, 2 capsules PO BID .  He was felt to be dehydrated and was given a liter of normal saline, started on Bactrim empirically for UTI, urine culture was collected.  Head CT was ordered.  He expressed interest in filling out a MOLST form He kept feeling worse and presented to the ED today for visual oscillations, dizziness, urinary incontinence.  He had fallen a few times.  Head CT was obtained which showed new finding of mets to the brain He was noted to be hypotensive. Labs showed hyponatremia, mild hypokalemia , no leukocytosis and drop in hemoglobin from 12 on 2/5-10 PCCM consulted for hypotension, given 3 L of fluid in  the ED, lactate was 1.3  He denies fevers, no cough or burning micturition, reports chills   PAST MEDICAL HISTORY :   has a past medical history of Anxiety, CAD (coronary artery disease), Cardiac arrest ([place]), Diabetes mellitus type II, controlled ([place]), Heart failure, systolic, acute on chronic ([place]), ICD (implantable cardiac defibrillator), single, in situ, ISCHEMIC CARDIOMYOPATHY (03/19/2009), LEFT VENTRICULAR MURAL THROMBUS (02/18/2009), Melanoma ([place]) (2019), MI (myocardial infarction) ([place]) (08/2003), PAROXYSMAL VENTRICULAR TACHYCARDIA (03/19/2009), Pneumonia (40/98/1191), SYSTOLIC HEART FAILURE, ACUTE ON CHRONIC (01/02/2009), and Ventricular tachycardia ([place]).  has a past surgical history that includes Tonsillectomy and adenoidectomy (1969); Skin biopsy (2019); Cardiac catheterization; Axillary lymph node dissection (Right, 10/05/2018); Ablation (03/2018); Axillary lymph node dissection (Right, 10/05/2018); Insert / replace / remove pacemaker; and Cataract extraction. Prior to Admission medications   Medication Sig Start Date End Date Taking? Authorizing Provider  acyclovir (ZOVIRAX) 800 MG tablet Take 1 tablet (800 mg total) by mouth 3 (three) times daily. Use for 4-6 days at onset of symptoms Patient taking differently: Take 2,400 mg by mouth once a week. Use for 4-6 days at onset of symptoms 10/29/19  Yes [name], Junell C, MD  amiodarone (PACERONE) 200 MG tablet Take 200 mg by mouth daily.   Yes [provider]  aspirin 81 MG tablet Take 81 mg by mouth daily.     Yes [provider]  carvedilol (COREG) 3.125 MG tablet Take 1.5625 mg by mouth 2 (two) times daily. Take 0.5 tablet (1.5625) BID 09/11/19  Yes [provider]  empagliflozin (JARDIANCE) 10 MG TABS tablet Take 10 mg by mouth daily. 05/04/19  Yes [name], Junell C, MD  ipratropium (ATROVENT) 0.06 % nasal spray USE 2 SPRAYS INTO BOTH NOSTRILS 3 (THREE) TIMES DAILY. Patient taking differently: Place 2 sprays  into both nostrils 3 (three) times daily.  07/05/19  Yes [name], Junell C, MD  metFORMIN (GLUCOPHAGE-XR) 500 MG 24 hr tablet TAKE 2 TABLETS (1,000 MG TOTAL) BY MOUTH AT BEDTIME. Patient taking differently: Take 500 mg by mouth 2 (two) times daily.  09/18/19  Yes [name], [name], MD  methimazole (TAPAZOLE) 10 MG tablet Take 1 tablet (10 mg total) by mouth daily. 11/15/19  Yes Renato Shin, MD  mexiletine (MEXITIL) 150 MG capsule Take 1 capsule (150 mg total) by mouth 3 (three) times daily. 06/19/19  Yes Evans Lance, MD  sacubitril-valsartan (ENTRESTO) 24-26 MG Take 1 tablet by mouth 2 (two) times daily. 06/19/19  Yes Evans Lance, MD  sulfamethoxazole-trimethoprim (BACTRIM DS) 800-160 MG tablet Take 1 tablet by mouth 2 (two) times daily. 01/04/20  Yes Tanner, Lyndon Code., PA-C  tamsulosin (FLOMAX) 0.4 MG CAPS capsule Take 1 capsule (0.4 mg total) by mouth daily. 12/19/19  Yes [name], [name], MD  blood glucose meter kit and supplies Dispense based on patient and insurance preference. Use up to four times daily as directed. (FOR ICD-10 E10.9, E11.9). 02/14/19   Hongalgi, Lenis Dickinson, MD  MEKINIST 2 MG tablet TAKE 1 TABLET (2 MG TOTAL) BY MOUTH DAILY. TAKE 1 HOUR BEFORE OR 2 HOURS AFTER A MEAL. STORE REFRIGERATED IN ORIGINAL CONTAINER. Patient taking differently: Take 2 mg by mouth daily.  01/02/20   Wyatt Portela, MD  rosuvastatin (CRESTOR) 10 MG tablet Take 1 tablet (10 mg total) by mouth daily. 06/19/19 09/17/19  Evans Lance, MD  TAFINLAR 75 MG capsule TAKE 2 CAPSULES (150 MG TOTAL) BY MOUTH 2 (TWO) TIMES DAILY. TAKE ON AN EMPTY STOMACH 1 HOUR BEFORE OR 2 HOURS AFTER MEALS. Patient taking differently: Take 150 mg by mouth 2 (two) times daily.  01/02/20   Wyatt Portela, MD   No Known Allergies  FAMILY HISTORY:  family history includes Alcohol abuse in his father; Alzheimer's disease in his maternal grandmother and mother; Diabetes in his father; Kidney Stones in his father; Thyroid disease in  his mother. SOCIAL HISTORY:  reports that he quit smoking about 15 years ago. His smoking use included cigars and cigarettes. He has a 20.00 pack-year smoking history. He has never used smokeless tobacco. He reports previous alcohol use. He reports that he does not use drugs.  REVIEW OF SYSTEMS:   Constitutional: Negative for fever,  weight loss, malaise/fatigue and diaphoresis. + chills, HENT: Negative for hearing loss, ear pain, nosebleeds, congestion, sore throat, neck pain, tinnitus and ear discharge.   Eyes: Negative for blurred vision, double vision, photophobia, pain, discharge and redness.  Respiratory: Negative for cough, hemoptysis, sputum production, shortness of breath, wheezing and stridor.   Cardiovascular: Negative for chest pain, palpitations, orthopnea, claudication, leg swelling and PND.  Gastrointestinal: Negative for heartburn, nausea, vomiting, abdominal pain, diarrhea, constipation, blood in stool and melena.  Genitourinary: Negative for dysuria, urgency, frequency, hematuria and flank pain.  Musculoskeletal: Negative for myalgias, back pain, joint pain and falls.  Skin: Negative for itching and rash.  Neurological: Negative for , tingling, tremors, sensory change, speech change, focal weakness, seizures, loss of consciousness, weakness , POs for dizziness headaches.  Endo/Heme/Allergies: Negative for environmental allergies and polydipsia. Does not bruise/bleed easily.  SUBJECTIVE:   VITAL SIGNS: Temp:  [96.1 F (35.6 C)]  96.1 F (35.6 C) (02/08 1200) Pulse Rate:  [62-96] 85 (02/08 1618) Resp:  [14-21] 15 (02/08 1618) BP: (59-87)/(37-74) 85/74 (02/08 1618) SpO2:  [69 %-100 %] 99 % (02/08 1618) Weight:  [66.7 kg] 66.7 kg (02/08 1148)  PHYSICAL EXAMINATION: General: Elderly, bundled up with several sheets, lying supine, no distress Neuro: Grossly nonfocal, moves all 4 extremities, alert and interactive HEENT: Mild pallor, no icterus Cardiovascular: S1-S2  regular Lungs: Clear bilateral breath sounds Abdomen: Soft and nontender Musculoskeletal: No deformity Skin: No ecchymosis, cool extremities  Recent Labs  Lab 01/04/20 1206 01/07/20 1245  NA 130* 129*  K 3.5 3.2*  CL 99 102  CO2 19* 18*  BUN 33* 27*  CREATININE 1.32* 1.29*  GLUCOSE 270* 153*   Recent Labs  Lab 01/04/20 1206 01/07/20 1245  HGB 12.0* 10.2*  HCT 35.6* 30.6*  WBC 3.7* 5.0  PLT 95* 137*   CT Head Wo Contrast  Addendum Date: 01/07/2020   ADDENDUM REPORT: 01/07/2020 16:56 ADDENDUM: Critical Value/emergent results were called by telephone at the time of interpretation on 01/07/2020 at 4:50 pm to Dr. Marda Stalker , who verbally acknowledged these results. Electronically Signed   By: Inez Catalina M.D.   On: 01/07/2020 16:56   Result Date: 01/07/2020 CLINICAL DATA:  Visual difficulties with dizziness, history of metastatic melanoma EXAM: CT HEAD WITHOUT CONTRAST TECHNIQUE: Contiguous axial images were obtained from the base of the skull through the vertex without intravenous contrast. COMPARISON:  05/03/2019 FINDINGS: Brain: No findings to suggest acute hemorrhage are identified. In the deep white matter on the left as well as within the subcortical area of the right temporal lobe region, there are ovoid appearing hyperdensities identified which were not seen on the prior exam. Some minimal surrounding edema is noted in the lesion on the left. No other definitive hyperdensities are seen. Vascular: No hyperdense vessel or unexpected calcification. Skull: Normal. Negative for fracture or focal lesion. Sinuses/Orbits: No acute finding. Other: None. IMPRESSION: New ovoid areas of increased attenuation deep white matter in the left parietal region and subcortical area of the right temporal lobe suspicious for metastatic lesions. MRI of the brain with and without contrast is recommended when clinically able. Given the patient's pacing device, this may need coordination and  scheduling with Cardiology if possible. Electronically Signed: By: Inez Catalina M.D. On: 01/07/2020 16:53   DG Chest Portable 1 View  Result Date: 01/07/2020 CLINICAL DATA:  History of metastatic melanoma with generalized weakness for 1 week. EXAM: PORTABLE CHEST 1 VIEW COMPARISON:  09/26/2019 FINDINGS: The cardiac silhouette, mediastinal and hilar contours are within normal limits and stable. The pacer wires/AICD are unchanged. No complicating features. The lungs are clear. No infiltrates, edema or effusions. No pneumothorax. The bony thorax is intact. IMPRESSION: No acute cardiopulmonary findings. Electronically Signed   By: Marijo Sanes M.D.   On: 01/07/2020 14:12    ASSESSMENT / PLAN:  Cause of hypotension is unclear -could be occult sepsis, dehydration seems to have been adequately treated with fluids in the ED, TSH appears to be normal. With the finding of bilateral adrenal metastases, adrenal insufficiency is in the differential. Doubt cardiogenic shock, recent echo reviewed  Recommend-empiric antibiotics while awaiting cultures Start dexamethasone 4 mg IV every 6, since this would also be helpful for brain metastases.  Ideally should obtain 8 AM cortisol and stim testing but given his overall situation would probably not pursue this  Nonischemic cardiomyopathy -Entresto and Coreg would be held. Amiodarone has already been  stopped. He expressed interest in turning off his ICD.  Goals of care-he was very clear that he does not want his life prolonged artificial means.  As such, DNR will be issued.  We will not start him on pressors.  He does not need ICU admission.  He does not want to pursue further testing such as MRI, would like to minimize blood draws.  He would like to get somewhat better quality of life and to see if he can get strong enough to walk and is agreeable to hospital admission for 2 to 3 days.  Ultimately he will need discharge with home hospice  PCCM will be available as  needed, have asked the EDP to request hospitalist for admission  Kara Mead MD. FCCP. [facility]  If no response to pager , please call [phone]     01/07/2020, 5:57 PM

## 2020-01-07 NOTE — ED Notes (Signed)
ED TO INPATIENT HANDOFF REPORT  Name/Age/Gender Ninetta Lights Hazell 69 y.o. male  Code Status    Code Status Orders  (From admission, onward)         Start     Ordered   01/07/20 1724  Do not attempt resuscitation/DNR  Continuous    Question Answer Comment  In the event of cardiac or respiratory ARREST Do not call a "code blue"   In the event of cardiac or respiratory ARREST Do not perform Intubation, CPR, defibrillation or ACLS   In the event of cardiac or respiratory ARREST Use medication by any route, position, wound care, and other measures to relive pain and suffering. May use oxygen, suction and manual treatment of airway obstruction as needed for comfort.      01/07/20 1723        Code Status History    Date Active Date Inactive Code Status Order ID Comments User Context   02/01/2019 0909 02/14/2019 1801 Full Code [id number]  Marijean Heath, NP ED   10/05/2018 1422 10/06/2018 1411 Full Code [id number]  Stark Klein, MD Inpatient   Advance Care Planning Activity    Advance Directive Documentation     Most Recent Value  Type of Advance Directive  Healthcare Power of Attorney, Living will  Pre-existing out of facility DNR order (yellow form or pink MOST form)  --  "MOST" Form in Place?  --      Home/SNF/Other Home  Chief Complaint Hypotension [I95.9] Hypotension, unspecified hypotension type [I95.9]  Level of Care/Admitting Diagnosis ED Disposition    ED Disposition Condition [place]: [place] [100102]  Level of Care: Telemetry [5]  Admit to tele based on following criteria: Other see comments  Comments: hypotension  May admit patient to [facility] or [facility] if equivalent level of care is available:: Yes  Covid Evaluation: Asymptomatic Screening Protocol (No Symptoms)  Diagnosis: Hypotension [431540]  Admitting Physician: Assunta Found [[id number]]  Attending Physician: Assunta Found  [[id number]]  Estimated length of stay: 3 - 4 days  Certification:: I certify this patient will need inpatient services for at least 2 midnights       Medical History Past Medical History:  Diagnosis Date  . Anxiety   . CAD (coronary artery disease)   . Cardiac arrest ([place])   . Diabetes mellitus type II, controlled ([place])   . Heart failure, systolic, acute on chronic ([place])   . ICD (implantable cardiac defibrillator), single, in situ   . ISCHEMIC CARDIOMYOPATHY 03/19/2009   Qualifier: Diagnosis of  By: Boyce Medici RN, BSN, [place]    . LEFT VENTRICULAR MURAL THROMBUS 02/18/2009   Qualifier: Diagnosis of  By: Stevie Kern NP-C, Sharyn Lull    . Melanoma ([place]) 2019  . MI (myocardial infarction) ([place]) 08/2003  . PAROXYSMAL VENTRICULAR TACHYCARDIA 03/19/2009   Qualifier: Diagnosis of  By: Boyce Medici RN, BSN, [place]    . Pneumonia 02/08/2019  . SYSTOLIC HEART FAILURE, ACUTE ON CHRONIC 01/02/2009   Qualifier: Diagnosis of  By: Stevie Kern NP-C, Sharyn Lull    . Ventricular tachycardia ([place])     Allergies No Known Allergies  IV Location/Drains/Wounds Patient Lines/Drains/Airways Status   Active Line/Drains/Airways    Name:   Placement date:   Placement time:   Site:   Days:   Peripheral IV 01/07/20 Left Antecubital   01/07/20    1322    Antecubital   less than 1   Peripheral IV 01/07/20 Left Wrist  01/07/20    1423    Wrist   less than 1          Labs/Imaging Results for orders placed or performed during the hospital encounter of 01/07/20 (from the past 48 hour(s))  CBC with Differential     Status: Abnormal   Collection Time: 01/07/20 12:45 PM  Result Value Ref Range   WBC 5.0 4.0 - 10.5 K/uL   RBC 3.50 (L) 4.22 - 5.81 MIL/uL   Hemoglobin 10.2 (L) 13.0 - 17.0 g/dL   HCT 30.6 (L) 39.0 - 52.0 %   MCV 87.4 80.0 - 100.0 fL   MCH 29.1 26.0 - 34.0 pg   MCHC 33.3 30.0 - 36.0 g/dL   RDW 15.9 (H) 11.5 - 15.5 %   Platelets 137 (L) 150 - 400 K/uL   nRBC 0.0 0.0 - 0.2 %   Neutrophils Relative % 80 %   Neutro  Abs 4.0 1.7 - 7.7 K/uL   Lymphocytes Relative 13 %   Lymphs Abs 0.6 (L) 0.7 - 4.0 K/uL   Monocytes Relative 5 %   Monocytes Absolute 0.3 0.1 - 1.0 K/uL   Eosinophils Relative 1 %   Eosinophils Absolute 0.0 0.0 - 0.5 K/uL   Basophils Relative 0 %   Basophils Absolute 0.0 0.0 - 0.1 K/uL   Immature Granulocytes 1 %   Abs Immature Granulocytes 0.04 0.00 - 0.07 K/uL    Comment: Performed at [facility], [place] [address] [place], [place] [address]  Comprehensive metabolic panel     Status: Abnormal   Collection Time: 01/07/20 12:45 PM  Result Value Ref Range   Sodium 129 (L) 135 - 145 mmol/L   Potassium 3.2 (L) 3.5 - 5.1 mmol/L   Chloride 102 98 - 111 mmol/L   CO2 18 (L) 22 - 32 mmol/L   Glucose, Bld 153 (H) 70 - 99 mg/dL   BUN 27 (H) 8 - 23 mg/dL   Creatinine, Ser 1.29 (H) 0.61 - 1.24 mg/dL   Calcium 8.2 (L) 8.9 - 10.3 mg/dL   Total Protein 5.4 (L) 6.5 - 8.1 g/dL   Albumin 2.6 (L) 3.5 - 5.0 g/dL   AST 59 (H) 15 - 41 U/L   ALT 193 (H) 0 - 44 U/L   Alkaline Phosphatase 153 (H) 38 - 126 U/L   Total Bilirubin 0.8 0.3 - 1.2 mg/dL   GFR calc non Af Amer 57 (L) >60 mL/min   GFR calc Af Amer >60 >60 mL/min   Anion gap 9 5 - 15    Comment: Performed at [facility], [place] [address] [place], [place] [address]  Lipase, blood     Status: Abnormal   Collection Time: 01/07/20 12:45 PM  Result Value Ref Range   Lipase 285 (H) 11 - 51 U/L    Comment: Performed at [facility], [place] [address] [place], [place] [address]  Lactic acid, plasma     Status: None   Collection Time: 01/07/20 12:45 PM  Result Value Ref Range   Lactic Acid, Venous 1.3 0.5 - 1.9 mmol/L    Comment: Performed at [facility], [place] [address] [place], [place] [address]  Hepatitis panel, acute     Status: None   Collection Time: 01/07/20 12:45 PM  Result Value Ref Range   Hepatitis B Surface Ag NON REACTIVE NON REACTIVE   HCV Ab NON REACTIVE NON  REACTIVE    Comment: (NOTE) Nonreactive HCV antibody  screen is consistent with no HCV infections,  unless recent infection is suspected or other evidence exists to indicate HCV infection.    Hep A IgM NON REACTIVE NON REACTIVE   Hep B C IgM NON REACTIVE NON REACTIVE    Comment: Performed at [facility], [place] [address] [place], [place] [address]  Urinalysis, Routine w reflex microscopic     Status: Abnormal   Collection Time: 01/07/20 12:45 PM  Result Value Ref Range   Color, Urine YELLOW YELLOW   APPearance CLEAR CLEAR   Specific Gravity, Urine 1.024 1.005 - 1.030   pH 6.0 5.0 - 8.0   Glucose, UA >=500 (A) NEGATIVE mg/dL   Hgb urine dipstick NEGATIVE NEGATIVE   Bilirubin Urine NEGATIVE NEGATIVE   Ketones, ur 5 (A) NEGATIVE mg/dL   Protein, ur 30 (A) NEGATIVE mg/dL   Nitrite NEGATIVE NEGATIVE   Leukocytes,Ua NEGATIVE NEGATIVE   RBC / HPF 0-5 0 - 5 RBC/hpf   WBC, UA 0-5 0 - 5 WBC/hpf   Bacteria, UA NONE SEEN NONE SEEN    Comment: Performed at [facility], [place] [address], [place], [place] [address]  Protime-INR     Status: None   Collection Time: 01/07/20 12:45 PM  Result Value Ref Range   Prothrombin Time 13.4 11.4 - 15.2 seconds   INR 1.0 0.8 - 1.2    Comment: (NOTE) INR goal varies based on device and disease states. Performed at [facility], [place] [address], [place], [place] [address]   Magnesium     Status: None   Collection Time: 01/07/20 12:45 PM  Result Value Ref Range   Magnesium 2.4 1.7 - 2.4 mg/dL    Comment: Performed at [facility], [place] [address], [place], [place] [address]  Ammonia     Status: None   Collection Time: 01/07/20 12:46 PM  Result Value Ref Range   Ammonia 14 9 - 35 umol/L    Comment: Performed at [facility], [place] [address], [place], [place] [address]  TSH     Status: None   Collection Time: 01/07/20 12:46 PM  Result Value Ref Range   TSH 3.722 0.350 -  4.500 uIU/mL    Comment: Performed by a 3rd Generation assay with a functional sensitivity of <=0.01 uIU/mL. Performed at [facility], [place] [address] [place],  [address]   Respiratory Panel by RT PCR (Flu A&B, Covid) - Nasopharyngeal Swab     Status: None   Collection Time: 01/07/20 12:46 PM   Specimen: Nasopharyngeal Swab  Result Value Ref Range   SARS Coronavirus 2 by RT PCR NEGATIVE NEGATIVE    Comment: (NOTE) SARS-CoV-2 target nucleic acids are NOT DETECTED. The SARS-CoV-2 RNA is generally detectable in upper respiratoy specimens during the acute phase of infection. The lowest concentration of SARS-CoV-2 viral copies this assay can detect is 131 copies/mL. A negative result does not preclude SARS-Cov-2 infection and should not be used as the sole basis for treatment or other patient management decisions. A negative result may occur with  improper specimen collection/handling, submission of specimen other than nasopharyngeal swab, presence of viral mutation(s) within the areas targeted by this assay, and inadequate number of viral copies (<131 copies/mL). A negative result must be combined with clinical observations, patient history, and epidemiological information. The expected result is Negative. Fact Sheet for Patients:  PinkCheek.be Fact Sheet for Healthcare Providers:  GravelBags.it This test is not yet ap proved  or cleared by the [place] and  has been authorized for detection and/or diagnosis of SARS-CoV-2 by FDA under an Emergency Use Authorization (EUA). This EUA will remain  in effect (meaning this test can be used) for the duration of the COVID-19 declaration under Section 564(b)(1) of the Act, 21 U.S.C. section 360bbb-3(b)(1), unless the authorization is terminated or revoked sooner.    Influenza A by PCR NEGATIVE NEGATIVE   Influenza B by PCR NEGATIVE NEGATIVE    Comment:  (NOTE) The Xpert Xpress SARS-CoV-2/FLU/RSV assay is intended as an aid in  the diagnosis of influenza from Nasopharyngeal swab specimens and  should not be used as a sole basis for treatment. Nasal washings and  aspirates are unacceptable for Xpert Xpress SARS-CoV-2/FLU/RSV  testing. Fact Sheet for Patients: PinkCheek.be Fact Sheet for Healthcare Providers: GravelBags.it This test is not yet approved or cleared by the [place] FDA and  has been authorized for detection and/or diagnosis of SARS-CoV-2 by  FDA under an Emergency Use Authorization (EUA). This EUA will remain  in effect (meaning this test can be used) for the duration of the  Covid-19 declaration under Section 564(b)(1) of the Act, 21  U.S.C. section 360bbb-3(b)(1), unless the authorization is  terminated or revoked. Performed at [facility], [place] [address], [place], [place] [address]   Brain natriuretic peptide     Status: Abnormal   Collection Time: 01/07/20 12:46 PM  Result Value Ref Range   B Natriuretic Peptide 367.2 (H) 0.0 - 100.0 pg/mL    Comment: Performed at [facility], [place] [address], [place], [place] [address]   CT Head Wo Contrast  Addendum Date: 01/07/2020   ADDENDUM REPORT: 01/07/2020 16:56 ADDENDUM: Critical Value/emergent results were called by telephone at the time of interpretation on 01/07/2020 at 4:50 pm to Dr. Marda Stalker , who verbally acknowledged these results. Electronically Signed   By: Inez Catalina M.D.   On: 01/07/2020 16:56   Result Date: 01/07/2020 CLINICAL DATA:  Visual difficulties with dizziness, history of metastatic melanoma EXAM: CT HEAD WITHOUT CONTRAST TECHNIQUE: Contiguous axial images were obtained from the base of the skull through the vertex without intravenous contrast. COMPARISON:  05/03/2019 FINDINGS: Brain: No findings to suggest acute hemorrhage are identified. In the deep  white matter on the left as well as within the subcortical area of the right temporal lobe region, there are ovoid appearing hyperdensities identified which were not seen on the prior exam. Some minimal surrounding edema is noted in the lesion on the left. No other definitive hyperdensities are seen. Vascular: No hyperdense vessel or unexpected calcification. Skull: Normal. Negative for fracture or focal lesion. Sinuses/Orbits: No acute finding. Other: None. IMPRESSION: New ovoid areas of increased attenuation deep white matter in the left parietal region and subcortical area of the right temporal lobe suspicious for metastatic lesions. MRI of the brain with and without contrast is recommended when clinically able. Given the patient's pacing device, this may need coordination and scheduling with Cardiology if possible. Electronically Signed: By: Inez Catalina M.D. On: 01/07/2020 16:53   DG Chest Portable 1 View  Result Date: 01/07/2020 CLINICAL DATA:  History of metastatic melanoma with generalized weakness for 1 week. EXAM: PORTABLE CHEST 1 VIEW COMPARISON:  09/26/2019 FINDINGS: The cardiac silhouette, mediastinal and hilar contours are within normal limits and stable. The pacer wires/AICD are unchanged. No complicating features. The lungs are clear. No infiltrates, edema or effusions. No pneumothorax. The bony thorax is intact.  IMPRESSION: No acute cardiopulmonary findings. Electronically Signed   By: Marijo Sanes M.D.   On: 01/07/2020 14:12    Pending Labs Unresulted Labs (From admission, onward)    Start     Ordered   01/07/20 2110  Hemoglobin A1c  Once,   STAT    Comments: To assess prior glycemic control    01/07/20 2110   01/07/20 1226  Urine culture  ONCE - STAT,   STAT     01/07/20 1231   01/07/20 1226  Blood culture (routine x 2)  BLOOD CULTURE X 2,   STAT     01/07/20 1231   Signed and Held  Basic metabolic panel  Daily,   R     Signed and Held   Signed and Held  CBC  Daily,   R      Signed and Held          Vitals/Pain Today's Vitals   01/07/20 2000 01/07/20 2030 01/07/20 2100 01/07/20 2104  BP: (!) 85/60 (!) 82/48 (!) 86/48   Pulse: (!) 102 (!) 102 100   Resp: (!) 21 (!) 22 19   Temp:    (!) 101.1 F (38.4 C)  TempSrc:    Rectal  SpO2: 91% 93% 93%   Weight:      Height:      PainSc:        Isolation Precautions No active isolations  Medications Medications  dexamethasone (DECADRON) injection 4 mg (4 mg Intravenous Given 01/07/20 1809)  LORazepam (ATIVAN) injection 2 mg (has no administration in time range)  LORazepam (ATIVAN) injection 0.5 mg (has no administration in time range)  morphine CONCENTRATE 10 MG/0.5ML oral solution 10 mg (has no administration in time range)  acetaminophen (TYLENOL) tablet 650 mg (650 mg Oral Given 01/07/20 2110)    Or  acetaminophen (TYLENOL) suppository 650 mg ( Rectal See Alternative 01/07/20 2110)  ceFEPIme (MAXIPIME) 2 g in sodium chloride 0.9 % 100 mL IVPB (has no administration in time range)  vancomycin (VANCOREADY) IVPB 1250 mg/250 mL (has no administration in time range)  insulin aspart (novoLOG) injection 0-15 Units (has no administration in time range)  sodium chloride 0.9 % bolus 1,000 mL (1,000 mLs Intravenous New Bag/Given (Non-Interop) 01/07/20 1356)  sodium chloride 0.9 % bolus 1,000 mL (1,000 mLs Intravenous New Bag/Given 01/07/20 1717)    Mobility non-ambulatory

## 2020-01-07 NOTE — Progress Notes (Signed)
Palliative Care consult received and case discussed with Dr. Elsworth Soho. Patient has progression of his Melanoma and is increasingly symptomatic. Will be admitted for symptom management. Recommend initiating IV with transition to oral, seizure prophylaxis may indicated depending on his history and medications for pain PRN. Will see him tomorrow AM to clarify goals of care and to explore hospice option.  Lane Hacker, DO Palliative Medicine [phone]

## 2020-01-07 NOTE — Progress Notes (Signed)
MEDICATION RELATED CONSULT NOTE - INITIAL   Pharmacy Consult for trametinib dimethyl sulfoxide (MEKINIST) 2 MG tablet along with dabrafenib Indication: Malignant melanoma, metastatic  No Known Allergies  Patient Measurements: Height: 6' (182.9 cm) Weight: 148 lb 5.9 oz (67.3 kg) IBW/kg (Calculated) : 77.6 Adjusted Body Weight:   Vital Signs: Temp: 99.1 F (37.3 C) (02/08 2131) Temp Source: Oral (02/08 2131) BP: 69/39 (02/08 2131) Pulse Rate: 94 (02/08 2131) Intake/Output from previous day: No intake/output data recorded. Intake/Output from this shift: No intake/output data recorded.  Labs: Recent Labs    01/07/20 1245  WBC 5.0  HGB 10.2*  HCT 30.6*  PLT 137*  CREATININE 1.29*  MG 2.4  ALBUMIN 2.6*  PROT 5.4*  AST 59*  ALT 193*  ALKPHOS 153*  BILITOT 0.8   Estimated Creatinine Clearance: 53.6 mL/min (A) (by C-G formula based on SCr of 1.29 mg/dL (H)).   Microbiology: Recent Results (from the past 720 hour(s))  Culture, Urine     Status: None   Collection Time: 12/17/19  2:12 PM   Specimen: Urine  Result Value Ref Range Status   MICRO NUMBER: [id number]  Final   SPECIMEN QUALITY: Adequate  Final   Sample Source NOT GIVEN  Final   STATUS: FINAL  Final   Result: No Growth  Final  Novel Coronavirus, NAA (Hosp order, Send-out to Ref Lab; TAT 18-24 hrs     Status: None   Collection Time: 12/18/19  1:42 PM   Specimen: Nasopharyngeal Swab; Respiratory  Result Value Ref Range Status   SARS-CoV-2, NAA NOT DETECTED NOT DETECTED Final    Comment: (NOTE) This nucleic acid amplification test was developed and its performance characteristics determined by [employer]. Nucleic acid amplification tests include RT-PCR and TMA. This test has not been FDA cleared or approved. This test has been authorized by FDA under an Emergency Use Authorization (EUA). This test is only authorized for the duration of time the declaration that circumstances exist justifying the  authorization of the emergency use of in vitro diagnostic tests for detection of SARS-CoV-2 virus and/or diagnosis of COVID-19 infection under section 564(b)(1) of the Act, 21 U.S.C. 010OFH-2(R) (1), unless the authorization is terminated or revoked sooner. When diagnostic testing is negative, the possibility of a false negative result should be considered in the context of a patient's recent exposures and the presence of clinical signs and symptoms consistent with COVID-19. An individual without symptoms of COVID- 19 and who is not shedding SARS-CoV-2  virus would expect to have a negative (not detected) result in this assay. Performed At: [facility] [address] [place], [place] [id number] Rush Farmer MD DI:[id number]    [place]  Final    Comment: Performed at [facility], [place] [address] [place], [place] [address]  Urine Culture     Status: None   Collection Time: 01/04/20 12:30 PM   Specimen: Urine, Clean Catch  Result Value Ref Range Status   Specimen Description   Final    URINE, CLEAN CATCH Performed at [facility], [place] [address] [place], [place] [address]    Special Requests   Final    NONE Performed at [facility], [place] [address] [place], [place] [address]    Culture   Final    NO GROWTH Performed at [facility], [place] [address] [place], [place] [address]    Report Status 01/05/2020 FINAL  Final  Respiratory Panel by  RT PCR (Flu A&B, Covid) - Nasopharyngeal Swab     Status: None   Collection Time: 01/07/20 12:46 PM   Specimen: Nasopharyngeal Swab  Result Value Ref Range Status   SARS Coronavirus 2 by RT PCR NEGATIVE NEGATIVE Final    Comment: (NOTE) SARS-CoV-2 target nucleic acids are NOT DETECTED. The SARS-CoV-2 RNA is generally detectable in upper respiratoy specimens during the acute phase of infection. The lowest concentration of SARS-CoV-2 viral copies  this assay can detect is 131 copies/mL. A negative result does not preclude SARS-Cov-2 infection and should not be used as the sole basis for treatment or other patient management decisions. A negative result may occur with  improper specimen collection/handling, submission of specimen other than nasopharyngeal swab, presence of viral mutation(s) within the areas targeted by this assay, and inadequate number of viral copies (<131 copies/mL). A negative result must be combined with clinical observations, patient history, and epidemiological information. The expected result is Negative. Fact Sheet for Patients:  PinkCheek.be Fact Sheet for Healthcare Providers:  GravelBags.it This test is not yet ap proved or cleared by the [place] FDA and  has been authorized for detection and/or diagnosis of SARS-CoV-2 by FDA under an Emergency Use Authorization (EUA). This EUA will remain  in effect (meaning this test can be used) for the duration of the COVID-19 declaration under Section 564(b)(1) of the Act, 21 U.S.C. section 360bbb-3(b)(1), unless the authorization is terminated or revoked sooner.    Influenza A by PCR NEGATIVE NEGATIVE Final   Influenza B by PCR NEGATIVE NEGATIVE Final    Comment: (NOTE) The Xpert Xpress SARS-CoV-2/FLU/RSV assay is intended as an aid in  the diagnosis of influenza from Nasopharyngeal swab specimens and  should not be used as a sole basis for treatment. Nasal washings and  aspirates are unacceptable for Xpert Xpress SARS-CoV-2/FLU/RSV  testing. Fact Sheet for Patients: PinkCheek.be Fact Sheet for Healthcare Providers: GravelBags.it This test is not yet approved or cleared by the [place] FDA and  has been authorized for detection and/or diagnosis of SARS-CoV-2 by  FDA under an Emergency Use Authorization (EUA). This EUA will remain  in  effect (meaning this test can be used) for the duration of the  Covid-19 declaration under Section 564(b)(1) of the Act, 21  U.S.C. section 360bbb-3(b)(1), unless the authorization is  terminated or revoked. Performed at [facility], [place] [address] [place], [place] [address]     Medical History: Past Medical History:  Diagnosis Date  . Anxiety   . CAD (coronary artery disease)   . Cardiac arrest ([place])   . Diabetes mellitus type II, controlled ([place])   . Heart failure, systolic, acute on chronic ([place])   . ICD (implantable cardiac defibrillator), single, in situ   . ISCHEMIC CARDIOMYOPATHY 03/19/2009   Qualifier: Diagnosis of  By: Boyce Medici RN, BSN, [place]    . LEFT VENTRICULAR MURAL THROMBUS 02/18/2009   Qualifier: Diagnosis of  By: Stevie Kern NP-C, Sharyn Lull    . Melanoma ([place]) 2019  . MI (myocardial infarction) ([place]) 08/2003  . PAROXYSMAL VENTRICULAR TACHYCARDIA 03/19/2009   Qualifier: Diagnosis of  By: Boyce Medici RN, BSN, [place]    . Pneumonia 02/08/2019  . SYSTOLIC HEART FAILURE, ACUTE ON CHRONIC 01/02/2009   Qualifier: Diagnosis of  By: Stevie Kern NP-C, Sharyn Lull    . Ventricular tachycardia ([facility])     Medications:  Medications Prior to Admission  Medication Sig Dispense Refill Last Dose  . acyclovir (ZOVIRAX) 800 MG tablet Take 1 tablet (800  mg total) by mouth 3 (three) times daily. Use for 4-6 days at onset of symptoms (Patient taking differently: Take 2,400 mg by mouth once a week. Use for 4-6 days at onset of symptoms) 90 tablet 1 Past Week at Unknown time  . amiodarone (PACERONE) 200 MG tablet Take 200 mg by mouth daily.   01/07/2020 at 0700  . aspirin 81 MG tablet Take 81 mg by mouth daily.     01/07/2020 at Unknown time  . carvedilol (COREG) 3.125 MG tablet Take 1.5625 mg by mouth 2 (two) times daily. Take 0.5 tablet (1.5625) BID   01/07/2020 at 0700  . empagliflozin (JARDIANCE) 10 MG TABS tablet Take 10 mg by mouth daily. 90 tablet 3 01/06/2020 at Unknown time  .  ipratropium (ATROVENT) 0.06 % nasal spray USE 2 SPRAYS INTO BOTH NOSTRILS 3 (THREE) TIMES DAILY. (Patient taking differently: Place 2 sprays into both nostrils 3 (three) times daily. ) 60 mL 1 01/07/2020 at Unknown time  . metFORMIN (GLUCOPHAGE-XR) 500 MG 24 hr tablet TAKE 2 TABLETS (1,000 MG TOTAL) BY MOUTH AT BEDTIME. (Patient taking differently: Take 500 mg by mouth 2 (two) times daily. ) 180 tablet 0 01/07/2020 at Unknown time  . methimazole (TAPAZOLE) 10 MG tablet Take 1 tablet (10 mg total) by mouth daily. 30 tablet 3 01/07/2020 at Unknown time  . mexiletine (MEXITIL) 150 MG capsule Take 1 capsule (150 mg total) by mouth 3 (three) times daily. 270 capsule 3 01/07/2020 at 1430  . sacubitril-valsartan (ENTRESTO) 24-26 MG Take 1 tablet by mouth 2 (two) times daily. 180 tablet 3 01/07/2020 at Unknown time  . sulfamethoxazole-trimethoprim (BACTRIM DS) 800-160 MG tablet Take 1 tablet by mouth 2 (two) times daily. 14 tablet 0 01/07/2020 at Unknown time  . tamsulosin (FLOMAX) 0.4 MG CAPS capsule Take 1 capsule (0.4 mg total) by mouth daily. 30 capsule 2 01/07/2020 at Unknown time  . blood glucose meter kit and supplies Dispense based on patient and insurance preference. Use up to four times daily as directed. (FOR ICD-10 E10.9, E11.9). 1 each 0   . MEKINIST 2 MG tablet TAKE 1 TABLET (2 MG TOTAL) BY MOUTH DAILY. TAKE 1 HOUR BEFORE OR 2 HOURS AFTER A MEAL. STORE REFRIGERATED IN ORIGINAL CONTAINER. (Patient taking differently: Take 2 mg by mouth daily. ) 30 tablet 0 12/31/2019 at unknown time  . rosuvastatin (CRESTOR) 10 MG tablet Take 1 tablet (10 mg total) by mouth daily. 90 tablet 3   . TAFINLAR 75 MG capsule TAKE 2 CAPSULES (150 MG TOTAL) BY MOUTH 2 (TWO) TIMES DAILY. TAKE ON AN EMPTY STOMACH 1 HOUR BEFORE OR 2 HOURS AFTER MEALS. (Patient taking differently: Take 150 mg by mouth 2 (two) times daily. ) 120 capsule 0 12/31/2019 at unknown time   Scheduled:  . aspirin EC  81 mg Oral Daily  . [START ON 01/08/2020]  canagliflozin  100 mg Oral QAC breakfast  . dabrafenib mesylate  150 mg Oral BID  . [START ON 01/08/2020] dexamethasone (DECADRON) injection  4 mg Intravenous Q6H  . enoxaparin (LOVENOX) injection  40 mg Subcutaneous Q24H  . [START ON 01/08/2020] insulin aspart  0-15 Units Subcutaneous TID WC  . ipratropium  2 spray Each Nare TID  . [START ON 01/08/2020] methimazole  10 mg Oral Daily  . mexiletine  150 mg Oral TID  . potassium chloride  40 mEq Oral Once  . rosuvastatin  10 mg Oral Daily  . sodium chloride flush  3 mL Intravenous Q12H  . [  START ON 01/08/2020] tamsulosin  0.4 mg Oral Daily  . trametinib dimethyl sulfoxide  2 mg Oral Daily    Assessment: Patient on oral chemo for Malignant melanoma, metastatic.  Patient also on antibiotics for possible sepsis.  Goal of Therapy:  Safe and effective use of  trametinib dimethyl sulfoxide Safe and effective use of dabrafenib   Plan:  D/c  trametinib dimethyl sulfoxide and dabrafenib Follow up with oncology recommendations  Nani Skillern [place] 01/07/2020,9:56 PM

## 2020-01-07 NOTE — Progress Notes (Signed)
[facility]pt[facility] Progress Note   Kotaro Buer [id number] 11/20/1951 69 y.o.  Kemond Amorin Broussard is managed by Dr. Zola Button  Actively treated with chemotherapy/immunotherapy/hormonal therapy: yes  Current therapy: Mekinist 2 mg PO QDay and Tafinlar 75 mg, 2 capsules PO BID  Next scheduled appointment with provider: 01/23/2020  Assessment: Plan:    Dehydration - Plan: 0.9 %  sodium chloride infusion  Other microscopic hematuria - Plan: Cytology - non gyn   Dehydration: The patient was given 1 L normal saline IV today.  Microscopic hematuria: A urine culture and urinalysis was collected today.  The patient was started on Bactrim DS p.o. twice daily x7 days while awaiting the results of his culture.  A urine cytology was submitted for a follow-up order from the patient's primary care provider.  Metastatic melanoma: The patient continues to be managed by Dr. Alen Blew and is currently treated with Mekinist 2 mg PO QDay and Tafinlar 75 mg, 2 capsules PO BID. A head CT has been ordered to evaluate the patient's dizziness.  Please see After Visit Summary for patient specific instructions.  Future Appointments  Date Time Provider [place]  01/07/2020  9:45 AM Short, Laurie Panda, RN [facility]  01/17/2020  9:30 AM Tanda Rockers, MD [facility] None  01/21/2020  1:30 PM Renato Shin, MD [facility] None  01/23/2020  8:35 AM [facility] DEVICE REMOTES [facility]  01/23/2020  3:15 PM [facility] LAB 4 [facility] None  01/23/2020  3:45 PM Wyatt Portela, MD [facility] None  04/23/2020  8:35 AM [facility] DEVICE REMOTES [facility]  07/23/2020  8:35 AM [facility] DEVICE REMOTES [facility]  10/22/2020  8:35 AM [facility] DEVICE REMOTES [facility]    No orders of the defined types were placed in this encounter.      Subjective:   Patient ID:  Shamel Germond is a 69 y.o.  (DOB September 30, 1951) male.  Chief Complaint:  Chief Complaint  Patient presents with  . Dizziness    HPI Colton Tassin Vanderpol Is a 69 y.o. male with a diagnosis of a metastatic melanoma. He continues to be managed by Dr. Alen Blew and is currently treated with Mekinist 2 mg PO QDay and Tafinlar 75 mg, 2 capsules PO BID. he presents to the clinic today with dizziness, nausea, hot flashes, unsteady gait with several episodes of nearly falling, anorexia, changes in color of his toes and fingers he had difficulty keeping the warm and darkened urine since Monday.  He reports that his symptoms began after taking his chemotherapy pills.  He was told to stop taking these on Tuesday but has not noted any change.  He reports having chills about 2 weeks ago.  He is urinating approximately 2 hours.  He reports having some jaw pain on and off.  His sister brought him to this appointment today.  Medications: I have reviewed the patient's current medications.  Allergies: No Known Allergies  Past Medical History:  Diagnosis Date  . Anxiety   . CAD (coronary artery disease)   . Cardiac arrest ([place])   . Diabetes mellitus type II, controlled ([place])   . Heart failure, systolic, acute on chronic ([place])   . ICD (implantable cardiac defibrillator), single, in situ   . ISCHEMIC CARDIOMYOPATHY 03/19/2009   Qualifier: Diagnosis of  By: Boyce Medici RN, BSN, [place]    . LEFT VENTRICULAR MURAL THROMBUS 02/18/2009   Qualifier: Diagnosis of  By: Stevie Kern NP-C, Sharyn Lull    . Melanoma ([place]) 2019  .  MI (myocardial infarction) ([place]) 08/2003  . PAROXYSMAL VENTRICULAR TACHYCARDIA 03/19/2009   Qualifier: Diagnosis of  By: Boyce Medici RN, BSN, [place]    . Pneumonia 02/08/2019  . SYSTOLIC HEART FAILURE, ACUTE ON CHRONIC 01/02/2009   Qualifier: Diagnosis of  By: Stevie Kern NP-C, Sharyn Lull    . Ventricular tachycardia [facility])     Past Surgical History:  Procedure Laterality Date  . ABLATION  03/2018   IN [place]  . AXILLARY LYMPH NODE DISSECTION  Right 10/05/2018   RIGHT AXILLARY LYMPH NODE DISSECTION   . AXILLARY LYMPH NODE DISSECTION Right 10/05/2018   Procedure: RIGHT AXILLARY LYMPH NODE DISSECTION;  Surgeon: Stark Klein, MD;  Location: [facility];  Service: General;  Laterality: Right;  . CARDIAC CATHETERIZATION     01/30/18 [facility]): [place] LAD. Patent LCX and RCA. Normal LVEDP 14 mmHg. Medical managment.  Marland Kitchen CATARACT EXTRACTION    . INSERT / REPLACE / REMOVE PACEMAKER    . SKIN BIOPSY  2019   melanoma R shoulder  . TONSILLECTOMY AND ADENOIDECTOMY  1969    Family History  Problem Relation Age of Onset  . Alzheimer's disease Mother   . Thyroid disease Mother        uncertain type  . Kidney Stones Father   . Diabetes Father   . Alcohol abuse Father   . Alzheimer's disease Maternal Grandmother     Social History   Socioeconomic History  . Marital status: Single    Spouse name: Not on file  . Number of children: Not on file  . Years of education: Not on file  . Highest education level: Not on file  Occupational History  . Occupation: Unemployed since 10/2008  Tobacco Use  . Smoking status: Former Smoker    Packs/day: 1.00    Years: 20.00    Pack years: 20.00    Types: Cigars, Cigarettes    Quit date: 02/22/2004    Years since quitting: 15.8  . Smokeless tobacco: Never Used  Substance and Sexual Activity  . Alcohol use: Yes    Comment: weekly  . Drug use: No  . Sexual activity: Not on file  Other Topics Concern  . Not on file  Social History Narrative   Single   Lives with sister   No regular exercise   Social Determinants of Health   Financial Resource Strain:   . Difficulty of Paying Living Expenses: Not on file  Food Insecurity:   . Worried About Charity fundraiser in the Last Year: Not on file  . Ran Out of Food in the Last Year: Not on file  Transportation Needs:   . Lack of Transportation (Medical): Not on file  . Lack of Transportation (Non-Medical): Not on file  Physical Activity:    . Days of Exercise per Week: Not on file  . Minutes of Exercise per Session: Not on file  Stress:   . Feeling of Stress : Not on file  Social Connections:   . Frequency of Communication with Friends and Family: Not on file  . Frequency of Social Gatherings with Friends and Family: Not on file  . Attends Religious Services: Not on file  . Active Member of Clubs or Organizations: Not on file  . Attends Archivist Meetings: Not on file  . Marital Status: Not on file  Intimate Partner Violence:   . Fear of Current or Ex-Partner: Not on file  . Emotionally Abused: Not on file  . Physically Abused: Not on file  .  Sexually Abused: Not on file    Past Medical History, Surgical history, Social history, and Family history were reviewed and updated as appropriate.   Please see review of systems for further details on the patient's review from today.   Review of Systems:  Review of Systems  Constitutional: Positive for appetite change, chills and fatigue. Negative for diaphoresis and fever.  HENT: Negative for dental problem, mouth sores and trouble swallowing.   Respiratory: Negative for cough, chest tightness and shortness of breath.   Cardiovascular: Negative for chest pain and palpitations.  Gastrointestinal: Positive for nausea. Negative for constipation, diarrhea and vomiting.  Genitourinary: Positive for frequency. Negative for difficulty urinating and dysuria.  Musculoskeletal: Positive for gait problem.  Skin: Positive for color change.  Neurological: Positive for dizziness and weakness. Negative for syncope and headaches.    Objective:   Physical Exam:  BP 93/67   Pulse 90   Temp 98.3 F (36.8 C)   Resp 17   Ht 6' (1.829 m)   Wt 145 lb 8 oz (66 kg)   SpO2 99%   BMI 19.73 kg/m  ECOG: 1  Physical Exam Constitutional:      General: He is not in acute distress.    Appearance: He is not diaphoretic.  HENT:     Head: Normocephalic and atraumatic.  Eyes:      General: No scleral icterus.       Right eye: No discharge.        Left eye: No discharge.     Conjunctiva/sclera: Conjunctivae normal.  Cardiovascular:     Rate and Rhythm: Normal rate and regular rhythm.     Heart sounds: Normal heart sounds. No murmur. No friction rub. No gallop.   Pulmonary:     Effort: Pulmonary effort is normal. No respiratory distress.     Breath sounds: Normal breath sounds. No wheezing or rales.  Abdominal:     General: Bowel sounds are normal. There is no distension.     Tenderness: There is no abdominal tenderness. There is no guarding.  Skin:    General: Skin is warm and dry.     Findings: No erythema or rash.  Neurological:     Mental Status: He is alert.     Gait: Gait abnormal (The patient is ambulating with the use of a wheelchair.).  Psychiatric:        Mood and Affect: Mood normal.        Behavior: Behavior normal.        Thought Content: Thought content normal.        Judgment: Judgment normal.     Lab Review:     Component Value Date/Time   NA 130 (L) 01/04/2020 1206   NA 137 03/05/2019 0000   K 3.5 01/04/2020 1206   CL 99 01/04/2020 1206   CO2 19 (L) 01/04/2020 1206   GLUCOSE 270 (H) 01/04/2020 1206   BUN 33 (H) 01/04/2020 1206   BUN 41 (A) 03/05/2019 0000   CREATININE 1.32 (H) 01/04/2020 1206   CREATININE 1.07 01/19/2019 1638   CALCIUM 8.1 (L) 01/04/2020 1206   PROT 5.7 (L) 01/04/2020 1206   PROT 6.6 10/03/2019 1119   ALBUMIN 2.8 (L) 01/04/2020 1206   ALBUMIN 4.4 10/03/2019 1119   AST 216 (HH) 01/04/2020 1206   ALT 506 (HH) 01/04/2020 1206   ALKPHOS 205 (H) 01/04/2020 1206   BILITOT 0.9 01/04/2020 1206   GFRNONAA 56 (L) 01/04/2020 1206   GFRAA >60 01/04/2020 1206  Component Value Date/Time   WBC 3.7 (L) 01/04/2020 1206   WBC 6.1 09/26/2019 1328   RBC 4.12 (L) 01/04/2020 1206   HGB 12.0 (L) 01/04/2020 1206   HCT 35.6 (L) 01/04/2020 1206   PLT 95 (L) 01/04/2020 1206   MCV 86.4 01/04/2020 1206   MCH 29.1  01/04/2020 1206   MCHC 33.7 01/04/2020 1206   RDW 15.5 01/04/2020 1206   LYMPHSABS 0.5 (L) 01/04/2020 1206   MONOABS 0.3 01/04/2020 1206   EOSABS 0.0 01/04/2020 1206   BASOSABS 0.0 01/04/2020 1206   -------------------------------  Imaging from last 24 hours (if applicable):  Radiology interpretation: ECHOCARDIOGRAM COMPLETE  Result Date: 12/11/2019   ECHOCARDIOGRAM REPORT   Patient Name:   LAMARCO GUDIEL Date of Exam: 12/11/2019 Medical Rec #:  [id number]               Height:       70.0 in Accession #:    [id number]              Weight:       164.4 lb Date of Birth:  07-Jan-1951               BSA:          1.92 m Patient Age:    63 years                BP:           107/65 mmHg Patient Gender: M                       HR:           65 bpm. Exam Location:  Outpatient Procedure: 2D Echo, Color Doppler, Cardiac Doppler and Strain Analysis Indications:    Z51.11 Encounter for antineoplastic chemotheraphy  History:        Patient has prior history of Echocardiogram examinations, most                 recent 02/01/2019. Cardiomyopathy and CHF, Previous Myocardial                 Infarction and CAD, Defibrillator; Risk Factors:Diabetes.  Sonographer:    Tiffany Dance Sonographer#2:  Dustin Flock Referring Phys: [place]  1. Left ventricular ejection fraction, by visual estimation, is 20 to 25%. The left ventricle has severely decreased function. There is no left ventricular hypertrophy.  2. Elevated left atrial and left ventricular end-diastolic pressures.  3. Left ventricular diastolic parameters are consistent with Grade I diastolic dysfunction (impaired relaxation).  4. The average left ventricular global longitudinal strain is -9.5 %.  5. Moderately dilated left ventricular internal cavity size.  6. The left ventricle demonstrates global hypokinesis. Inferior and apical akinesis.  7. Left atrial size was mild-moderately dilated.  8. The aortic valve is tricuspid. Aortic  valve regurgitation is mild. Mild aortic valve sclerosis without stenosis.  9. The pulmonic valve was grossly normal. Pulmonic valve regurgitation is not visualized. 10. A AICD wire is visualized. 11. Right atrial size was normal. 12. Global right ventricle has normal systolic function.The right ventricular size is normal. No increase in right ventricular wall thickness. 13. The tricuspid valve is grossly normal. 14. The mitral valve is grossly normal. Mild mitral valve regurgitation. 15. The inferior vena cava is normal in size with greater than 50% respiratory variability, suggesting right atrial pressure of 3 mmHg. 16. No intracardiac thrombi or masses were visualized. 17.  A prior study was performed on 02/01/2019. 18. LVEF 20-25%, moderately dilated LV, severe inferior/apical akinesis,. 19. No significant change from prior study. FINDINGS  Left Ventricle: Left ventricular ejection fraction, by visual estimation, is 20 to 25%. The left ventricle has severely decreased function. The average left ventricular global longitudinal strain is -9.5 %. The left ventricle demonstrates global hypokinesis. The left ventricular internal cavity size was moderately dilated left ventricle. There is no left ventricular hypertrophy. Left ventricular diastolic parameters are consistent with Grade I diastolic dysfunction (impaired relaxation). Elevated left atrial and left ventricular end-diastolic pressures. Right Ventricle: The right ventricular size is normal. No increase in right ventricular wall thickness. Global RV systolic function is has normal systolic function. Left Atrium: Left atrial size was mild-moderately dilated. Right Atrium: Right atrial size was normal in size Pericardium: There is no evidence of pericardial effusion. Mitral Valve: The mitral valve is grossly normal. Mild mitral valve regurgitation. Tricuspid Valve: The tricuspid valve is grossly normal. Tricuspid valve regurgitation is trivial. Aortic Valve: The  aortic valve is tricuspid. Aortic valve regurgitation is mild. Aortic regurgitation PHT measures 444 msec. Mild aortic valve sclerosis is present, with no evidence of aortic valve stenosis. Pulmonic Valve: The pulmonic valve was grossly normal. Pulmonic valve regurgitation is not visualized. Pulmonic regurgitation is not visualized. Aorta: The aortic root and ascending aorta are structurally normal, with no evidence of dilitation. Venous: The inferior vena cava is normal in size with greater than 50% respiratory variability, suggesting right atrial pressure of 3 mmHg. IAS/Shunts: No atrial level shunt detected by color flow Doppler. Additional Comments: A prior study was performed on 02/01/2019. No intracardiac thrombi or masses were visualized. A AICD wire is visualized.  LEFT VENTRICLE PLAX 2D LVIDd:         6.10 cm  Diastology LVIDs:         5.20 cm  LV e' lateral:   4.50 cm/s LV PW:         1.10 cm  LV E/e' lateral: 12.1 LV IVS:        0.90 cm  LV e' medial:    3.45 cm/s LVOT diam:     2.00 cm  LV E/e' medial:  15.8 LV SV:         57 ml LV SV Index:   29.86    2D Longitudinal Strain LVOT Area:     3.14 cm 2D Strain GLS Avg:     -9.5 %  RIGHT VENTRICLE             IVC RV Basal diam:  2.30 cm     IVC diam: 1.50 cm RV S prime:     17.40 cm/s TAPSE (M-mode): 2.1 cm LEFT ATRIUM             Index       RIGHT ATRIUM           Index LA diam:        3.90 cm 2.03 cm/m  RA Area:     11.30 cm LA Vol (A2C):   81.9 ml 42.65 ml/m RA Volume:   25.70 ml  13.38 ml/m LA Vol (A4C):   46.3 ml 24.11 ml/m LA Biplane Vol: 67.6 ml 35.20 ml/m  AORTIC VALVE LVOT Vmax:   83.90 cm/s LVOT Vmean:  48.100 cm/s LVOT VTI:    0.138 m AI PHT:      444 msec  AORTA Ao Root diam: 3.90 cm Ao Asc diam:  3.40 cm MITRAL VALVE  MV Area (PHT): 3.27 cm              SHUNTS MV PHT:        67.28 msec            Systemic VTI:  0.14 m MV Decel Time: 232 msec              Systemic Diam: 2.00 cm MV E velocity: 54.50 cm/s  103 cm/s MV A velocity: 112.00 cm/s  70.3 cm/s MV E/A ratio:  0.49        1.5  Lyman Bishop MD Electronically signed by Lyman Bishop MD Signature Date/Time: 12/11/2019/1:45:01 PM    Final         This case was discussed Dr. Alen Blew. He expressed agreement with my management of this patient.

## 2020-01-07 NOTE — Telephone Encounter (Signed)
VM left on triage line, pt states that he is having 'worsening problems' and requests a call back from [facility] provider) specifically at this time for further advisement.  PA Lucianne Lei made aware, contacted pt via telephone.  Pt reports to PA [place] being unable to walk and severe dizziness.  Pt advised to call EMS and be transported to [facility] by [place], pt agreed to plan.  PA Lucianne Lei to alert MD [facility].

## 2020-01-07 NOTE — ED Provider Notes (Signed)
[facility] DEPT Provider Note   CSN: [id number] Arrival date & time: 01/07/20  1125     History Chief Complaint  Patient presents with  . Generalized Weakness  . Cancer Patient    Lee Hall is a 69 y.o. male.  The history is provided by the patient and medical records.  Illness Location:  Severe fatigue Quality:  Gradual Severity:  Severe Onset quality:  Gradual Duration:  1 week Timing:  Constant Progression:  Worsening Chronicity:  New Associated symptoms: fatigue   Associated symptoms: no abdominal pain, no chest pain, no congestion, no cough, no diarrhea, no fever, no headaches, no nausea, no rash, no shortness of breath, no vomiting and no wheezing        Past Medical History:  Diagnosis Date  . Anxiety   . CAD (coronary artery disease)   . Cardiac arrest ([place])   . Diabetes mellitus type II, controlled ([place])   . Heart failure, systolic, acute on chronic ([place])   . ICD (implantable cardiac defibrillator), single, in situ   . ISCHEMIC CARDIOMYOPATHY 03/19/2009   Qualifier: Diagnosis of  By: Boyce Medici RN, BSN, [place]    . LEFT VENTRICULAR MURAL THROMBUS 02/18/2009   Qualifier: Diagnosis of  By: Stevie Kern NP-C, Sharyn Lull    . Melanoma ([place]) 2019  . MI (myocardial infarction) ([place]) 08/2003  . PAROXYSMAL VENTRICULAR TACHYCARDIA 03/19/2009   Qualifier: Diagnosis of  By: Boyce Medici RN, BSN, [place]    . Pneumonia 02/08/2019  . SYSTOLIC HEART FAILURE, ACUTE ON CHRONIC 01/02/2009   Qualifier: Diagnosis of  By: Stevie Kern NP-C, Sharyn Lull    . Ventricular tachycardia [facility])     Patient Active Problem List   Diagnosis Date Noted  . Hyperthyroidism 11/15/2019  . Aortic ectasia, abdominal ([place]) 05/11/2019  . Upper airway cough syndrome 04/13/2019  . Diabetes mellitus type II, controlled ([place]) 02/23/2019  . ARF (acute renal failure) ([place]) 02/23/2019  . DOE (dyspnea on exertion) 02/22/2019  . Pneumonia of right lower lobe due to infectious  organism 02/22/2019  . Lower extremity edema 02/22/2019  . Healthcare maintenance 02/22/2019  . Coronary artery disease involving native coronary artery of native heart without angina pectoris   . S/P placement of cardiac pacemaker   . Atrial fibrillation ([place])   . History of sustained ventricular tachycardia   . Acute hypoxemic respiratory failure ([place])   . Community acquired pneumonia of right lung   . Severe sepsis ([place])   . Septic shock ([place]) 02/01/2019  . Malignant melanoma metastatic to lymph node ([place]) 10/05/2018  . Tremor of right hand 08/07/2018  . Melanoma of skin ([place]) 08/07/2018  . Abnormal positron emission tomography (PET) scan 08/07/2018  . Nonischemic cardiomyopathy ([place]) 03/19/2009  . Ventricular tachycardia ([place]) 03/19/2009  . CAD, NATIVE VESSEL 02/18/2009  . LEFT VENTRICULAR MURAL THROMBUS 02/18/2009  . Chronic systolic heart failure ([place]) 01/02/2009  . TACHYCARDIA 01/02/2009  . Implantable cardioverter-defibrillator (ICD) in situ 01/02/2009    Past Surgical History:  Procedure Laterality Date  . ABLATION  03/2018   IN [place]  . AXILLARY LYMPH NODE DISSECTION Right 10/05/2018   RIGHT AXILLARY LYMPH NODE DISSECTION   . AXILLARY LYMPH NODE DISSECTION Right 10/05/2018   Procedure: RIGHT AXILLARY LYMPH NODE DISSECTION;  Surgeon: Stark Klein, MD;  Location: [facility];  Service: General;  Laterality: Right;  . CARDIAC CATHETERIZATION     01/30/18 [facility]): [place] LAD. Patent LCX and RCA. Normal LVEDP 14 mmHg. Medical managment.  Marland Kitchen CATARACT EXTRACTION    .  INSERT / REPLACE / REMOVE PACEMAKER    . SKIN BIOPSY  2019   melanoma R shoulder  . TONSILLECTOMY AND ADENOIDECTOMY  1969       Family History  Problem Relation Age of Onset  . Alzheimer's disease Mother   . Thyroid disease Mother        uncertain type  . Kidney Stones Father   . Diabetes Father   . Alcohol abuse Father   . Alzheimer's disease Maternal Grandmother     Social History    Tobacco Use  . Smoking status: Former Smoker    Packs/day: 1.00    Years: 20.00    Pack years: 20.00    Types: Cigars, Cigarettes    Quit date: 02/22/2004    Years since quitting: 15.8  . Smokeless tobacco: Never Used  Substance Use Topics  . Alcohol use: Yes    Comment: weekly  . Drug use: No    Home Medications Prior to Admission medications   Medication Sig Start Date End Date Taking? Authorizing Provider  acyclovir (ZOVIRAX) 800 MG tablet Take 1 tablet (800 mg total) by mouth 3 (three) times daily. Use for 4-6 days at onset of symptoms Patient taking differently: Take 800 mg by mouth once a week. Use for 4-6 days at onset of symptoms 10/29/19   [name], MD  aspirin 81 MG tablet Take 81 mg by mouth daily.      [provider]  blood glucose meter kit and supplies Dispense based on patient and insurance preference. Use up to four times daily as directed. (FOR ICD-10 E10.9, E11.9). 02/14/19   Hongalgi, Lenis Dickinson, MD  carvedilol (COREG) 3.125 MG tablet Take 3.125 mg by mouth 2 (two) times daily. 09/11/19   [provider]  empagliflozin (JARDIANCE) 10 MG TABS tablet Take 10 mg by mouth daily. 05/04/19   [name], Andris Flurry C, MD  ipratropium (ATROVENT) 0.06 % nasal spray USE 2 SPRAYS INTO BOTH NOSTRILS 3 (THREE) TIMES DAILY. 07/05/19   [name], [name], MD  MEKINIST 2 MG tablet TAKE 1 TABLET (2 MG TOTAL) BY MOUTH DAILY. TAKE 1 HOUR BEFORE OR 2 HOURS AFTER A MEAL. STORE REFRIGERATED IN ORIGINAL CONTAINER. 01/02/20   Wyatt Portela, MD  metFORMIN (GLUCOPHAGE-XR) 500 MG 24 hr tablet TAKE 2 TABLETS (1,000 MG TOTAL) BY MOUTH AT BEDTIME. Patient taking differently: Take 500 mg by mouth 2 (two) times daily.  09/18/19   [name], MD  methimazole (TAPAZOLE) 10 MG tablet Take 1 tablet (10 mg total) by mouth daily. 11/15/19   Renato Shin, MD  mexiletine (MEXITIL) 150 MG capsule Take 1 capsule (150 mg total) by mouth 3 (three) times daily. 06/19/19   Evans Lance, MD  rosuvastatin (CRESTOR) 10 MG tablet Take 1 tablet (10 mg total) by mouth daily. 06/19/19 09/17/19  Evans Lance, MD  sacubitril-valsartan (ENTRESTO) 24-26 MG Take 1 tablet by mouth 2 (two) times daily. 06/19/19   Evans Lance, MD  sulfamethoxazole-trimethoprim (BACTRIM DS) 800-160 MG tablet Take 1 tablet by mouth 2 (two) times daily. 01/04/20   Tanner, Lucianne Lei E., PA-C  TAFINLAR 75 MG capsule TAKE 2 CAPSULES (150 MG TOTAL) BY MOUTH 2 (TWO) TIMES DAILY. TAKE ON AN EMPTY STOMACH 1 HOUR BEFORE OR 2 HOURS AFTER MEALS. Patient not taking: Reported on 01/04/2020 01/02/20   Wyatt Portela, MD  tamsulosin (FLOMAX) 0.4 MG CAPS capsule Take 1 capsule (0.4 mg total) by mouth daily. 12/19/19   Micheline Rough  C, MD    Allergies    Patient has no known allergies.  Review of Systems   Review of Systems  Constitutional: Positive for fatigue. Negative for chills, diaphoresis and fever.  HENT: Negative for congestion.   Eyes: Positive for visual disturbance.  Respiratory: Negative for cough, chest tightness, shortness of breath, wheezing and stridor.   Cardiovascular: Negative for chest pain, palpitations and leg swelling.  Gastrointestinal: Negative for abdominal pain, constipation, diarrhea, nausea and vomiting.  Genitourinary: Negative for decreased urine volume, dysuria, flank pain and frequency.       Incontinence   Musculoskeletal: Negative for back pain.  Skin: Negative for rash.  Neurological: Positive for dizziness and weakness. Negative for seizures, facial asymmetry, light-headedness, numbness and headaches.  Psychiatric/Behavioral: Negative for agitation.  All other systems reviewed and are negative.   Physical Exam Updated Vital Signs BP (!) 85/74   Pulse 85   Temp (!) 96.1 F (35.6 C) (Axillary)   Resp 15   Ht 6' (1.829 m)   Wt 66.7 kg   SpO2 99%   BMI 19.94 kg/m   Physical Exam Constitutional:      General: He is not in acute distress.    Appearance: He is  well-developed. He is ill-appearing. He is not toxic-appearing or diaphoretic.  HENT:     Head: Normocephalic and atraumatic.     Right Ear: External ear normal.     Left Ear: External ear normal.     Nose: Nose normal. No congestion or rhinorrhea.     Mouth/Throat:     Mouth: Mucous membranes are dry.     Pharynx: No oropharyngeal exudate or posterior oropharyngeal erythema.  Eyes:     General: No visual field deficit.    Conjunctiva/sclera: Conjunctivae normal.     Pupils: Pupils are equal, round, and reactive to light.  Cardiovascular:     Rate and Rhythm: Regular rhythm. Tachycardia present.     Pulses: Normal pulses.  Pulmonary:     Effort: Pulmonary effort is normal. No respiratory distress.     Breath sounds: No stridor. No wheezing, rhonchi or rales.  Chest:     Chest wall: No tenderness.  Abdominal:     General: Abdomen is flat.     Palpations: Abdomen is soft.     Tenderness: There is no abdominal tenderness. There is no right CVA tenderness, left CVA tenderness, guarding or rebound.  Musculoskeletal:     Cervical back: Normal range of motion and neck supple. No tenderness.  Skin:    General: Skin is warm.     Capillary Refill: Capillary refill takes less than 2 seconds.     Findings: No erythema or rash.  Neurological:     General: No focal deficit present.     Mental Status: He is alert and oriented to person, place, and time.     Cranial Nerves: No cranial nerve deficit, dysarthria or facial asymmetry.     Sensory: Sensation is intact. No sensory deficit.     Motor: Weakness present. No abnormal muscle tone.     Coordination: Coordination normal. Finger-Nose-Finger Test normal.     Deep Tendon Reflexes: Reflexes normal.     Comments: Bilateral leg weakness. No numbness. No other focal deficits. No back tenderness.   Psychiatric:        Mood and Affect: Mood normal.     ED Results / Procedures / Treatments   Labs (all labs ordered are listed, but only  abnormal results are  displayed) Labs Reviewed  CBC WITH DIFFERENTIAL/PLATELET - Abnormal; Notable for the following components:      Result Value   RBC 3.50 (*)    Hemoglobin 10.2 (*)    HCT 30.6 (*)    RDW 15.9 (*)    Platelets 137 (*)    Lymphs Abs 0.6 (*)    All other components within normal limits  COMPREHENSIVE METABOLIC PANEL - Abnormal; Notable for the following components:   Sodium 129 (*)    Potassium 3.2 (*)    CO2 18 (*)    Glucose, Bld 153 (*)    BUN 27 (*)    Creatinine, Ser 1.29 (*)    Calcium 8.2 (*)    Total Protein 5.4 (*)    Albumin 2.6 (*)    AST 59 (*)    ALT 193 (*)    Alkaline Phosphatase 153 (*)    GFR calc non Af Amer 57 (*)    All other components within normal limits  LIPASE, BLOOD - Abnormal; Notable for the following components:   Lipase 285 (*)    All other components within normal limits  URINALYSIS, ROUTINE W REFLEX MICROSCOPIC - Abnormal; Notable for the following components:   Glucose, UA >=500 (*)    Ketones, ur 5 (*)    Protein, ur 30 (*)    All other components within normal limits  BRAIN NATRIURETIC PEPTIDE - Abnormal; Notable for the following components:   B Natriuretic Peptide 367.2 (*)    All other components within normal limits  RESPIRATORY PANEL BY RT PCR (FLU A&B, COVID)  URINE CULTURE  CULTURE, BLOOD (ROUTINE X 2)  CULTURE, BLOOD (ROUTINE X 2)  LACTIC ACID, PLASMA  HEPATITIS PANEL, ACUTE  AMMONIA  PROTIME-INR  TSH  MAGNESIUM    EKG EKG Interpretation  Date/Time:  Monday January 07 2020 12:35:41 EST Ventricular Rate:  65 PR Interval:    QRS Duration: 203 QT Interval:  499 QTC Calculation: 519 R Axis:   73 Text Interpretation: Ventricular premature complex Probable left atrial enlargement LVH with secondary repolarization abnormality Prolonged QT interval Similar Paced rhythm No STEMI Confirmed by Antony Blackbird [phone]) on 01/07/2020 1:22:30 PM   Radiology CT Head Wo Contrast  Addendum Date: 01/07/2020     ADDENDUM REPORT: 01/07/2020 16:56 ADDENDUM: Critical Value/emergent results were called by telephone at the time of interpretation on 01/07/2020 at 4:50 pm to Dr. Marda Stalker , who verbally acknowledged these results. Electronically Signed   By: Inez Catalina M.D.   On: 01/07/2020 16:56   Result Date: 01/07/2020 CLINICAL DATA:  Visual difficulties with dizziness, history of metastatic melanoma EXAM: CT HEAD WITHOUT CONTRAST TECHNIQUE: Contiguous axial images were obtained from the base of the skull through the vertex without intravenous contrast. COMPARISON:  05/03/2019 FINDINGS: Brain: No findings to suggest acute hemorrhage are identified. In the deep white matter on the left as well as within the subcortical area of the right temporal lobe region, there are ovoid appearing hyperdensities identified which were not seen on the prior exam. Some minimal surrounding edema is noted in the lesion on the left. No other definitive hyperdensities are seen. Vascular: No hyperdense vessel or unexpected calcification. Skull: Normal. Negative for fracture or focal lesion. Sinuses/Orbits: No acute finding. Other: None. IMPRESSION: New ovoid areas of increased attenuation deep white matter in the left parietal region and subcortical area of the right temporal lobe suspicious for metastatic lesions. MRI of the brain with and without contrast is recommended when clinically  able. Given the patient's pacing device, this may need coordination and scheduling with Cardiology if possible. Electronically Signed: By: Inez Catalina M.D. On: 01/07/2020 16:53   DG Chest Portable 1 View  Result Date: 01/07/2020 CLINICAL DATA:  History of metastatic melanoma with generalized weakness for 1 week. EXAM: PORTABLE CHEST 1 VIEW COMPARISON:  09/26/2019 FINDINGS: The cardiac silhouette, mediastinal and hilar contours are within normal limits and stable. The pacer wires/AICD are unchanged. No complicating features. The lungs are clear. No  infiltrates, edema or effusions. No pneumothorax. The bony thorax is intact. IMPRESSION: No acute cardiopulmonary findings. Electronically Signed   By: Marijo Sanes M.D.   On: 01/07/2020 14:12    Procedures Procedures (including critical care time)  CRITICAL CARE Performed by: Gwenyth Allegra Eh Sauseda Total critical care time: 35 minutes Critical care time was exclusive of separately billable procedures and treating other patients. Critical care was necessary to treat or prevent imminent or life-threatening deterioration. Critical care was time spent personally by me on the following activities: development of treatment plan with patient and/or surrogate as well as nursing, discussions with consultants, evaluation of patient's response to treatment, examination of patient, obtaining history from patient or surrogate, ordering and performing treatments and interventions, ordering and review of laboratory studies, ordering and review of radiographic studies, pulse oximetry and re-evaluation of patient's condition.   Medications Ordered in ED Medications  sodium chloride 0.9 % bolus 1,000 mL (1,000 mLs Intravenous New Bag/Given (Non-Interop) 01/07/20 1356)  sodium chloride 0.9 % bolus 1,000 mL (1,000 mLs Intravenous New Bag/Given 01/07/20 1717)    ED Course  I have reviewed the triage vital signs and the nursing notes.  Pertinent labs & imaging results that were available during my care of the patient were reviewed by me and considered in my medical decision making (see chart for details).    MDM Rules/Calculators/A&P                      Maeson Purohit is a 69 y.o. male with a past medical history significant for metastatic melanoma currently on chemotherapy, CAD, CHF with pacemaker/ICD, DM, and prior V. tach who presents with several complaints including lightheadedness, dizziness, vision changes, hallucinations, darkened urine, bilateral leg weakness, urinary incontinence, and  fatigue.  According to patient, he is being treated with chemotherapy for recurrent melanoma that is now metastatic.  He says that for the last week he has been feeling very fatigued and tired with some room spinning dizziness as well.  He reports he was having some urinary changes including the darkened urine and was started on antibiotics for UTI by the patient's oncology team last week.  He also blood work done at that time.  He says that over the weekend he had several falls and is having bilateral leg weakness and urinary incontinence.  This is all new.  He reports he is having no headache but is having visual changes including "psychedelic vision" which he describes as color distortion in his vision and some black spots in his vision in both eyes.  He reports no nausea or vomiting.  He denies any chest pain, shortness of breath, palpitations.  He denies any abdominal pain, nausea, or vomiting at this time.  He denies any back pain.  He feels very tired and fatigued and reports he is concerned about his blood pressure.  Of note, he reports that he started to fill out a MOST form but has not been completed.  He  says that as of this point, he does want to be treated but just does not want CPR to be performed or prolonged life support.  On exam, patient is slightly jaundiced.  His lungs are clear and chest is nontender.  Abdomen is nontender.  Bowel sounds were appreciated.  Patient does have bilateral weakness in his legs and can barely raise them off the bed.  He has normal sensation however.  Good pulses in all extremities.  Normal finger-nose-finger testing.  Normal sensation and grip strength in arms.  No back tenderness on exam.  Pupils are symmetric and reactive with symmetric smile and no facial droop.  Patient was hypotensive on my first evaluation with a blood pressure of 60/37.  Spoke to oncology initially to discuss work-up direction and management plan.  Spoke with Dr. Marin Olp who is concerned  that patient may now have metastatic disease to his brain and back causing his symptoms.  With the worsening liver function last week, he agrees with getting repeat labs, giving fluids, and getting MRI with and without contrast of the brain, T-spine, and L-spine.  We will get a CT initially given the falls.  He agrees with admission to medicine when work-up is completed.  They will see the patient in consultation as well.  Anticipate admission for hypotension and his other symptoms.       Patient's work-up again to return showing negative Covid and flu test.  TSH normal.  Ammonia not elevated.  LFTs are improved from prior and he has no leukocytosis.  Hemoglobin is slightly lower than prior and BNP is elevated at 367.  Metabolic panel shows that creatinine is 1.2 and he is hyponatremic.  There were some electrolyte abnormalities as well.  Hepatitis panel negative.  Lipase is elevated but he has no abdominal pain or tenderness on exam.  Lactic acid not elevated.  Doubt infection at this time.  Urinalysis is clean with no evidence of infection at this time.  CT head shows concern for metastasis.  Radiology recommended MRI with and without contrast to further evaluate and make sure there is no component of edema, stroke, or bleeding.  Chest x-ray shows no pneumonia.   5:17 PM Just reassessed patient and informed him of the findings on the CT head scan showing concern for metastasis.  Patient's blood pressure still remains low and is hypotensive in the 80s.  Patient reports that although the oncologist recommended MRI brain, T-spine, and L-spine with and without contrast, he does not want to be transferred to [facility] and would rather be admitted here.  I spoke with critical care who is going to see the patient to see if he is appropriate for either ICU or the hospitalist service to admit with them in consultation.  We will hold on pressors until they see the patient.  Patient also reports that he wants to  confirm that he is DNR but agrees to get fluids or other medications to help with comfort.  He does not want the MRIs at this time.  Palliative care consultation will be placed for the patient.  He continues to stress that he wants "no suffering, no pain".  He will be seen by the ICU team before admission.  5:54 PM Critical care came to the bedside and feel he does not need pressors and they do not feel he needs ICU care.  They request admission to medicine service for further management as well as palliative/hospice consultation.  Care transferred to Dr. Kathrynn Humble  while awaiting admission to the medicine service.   Final Clinical Impression(s) / ED Diagnoses Final diagnoses:  Hypotension, unspecified hypotension type     Clinical Impression: 1. Hypotension, unspecified hypotension type     Disposition: Admit  This note was prepared with assistance of Dragon voice recognition software. Occasional wrong-word or sound-a-like substitutions may have occurred due to the inherent limitations of voice recognition software.      Juvenal Umar, Gwenyth Allegra, MD 01/07/20 2002

## 2020-01-07 NOTE — Progress Notes (Signed)
Lee Hall called this morning stating that he had fallen twice at home this weekend was unable to bear his weight.  He also reported that he had developed urinary incontinence.  He was also continuing to have visual hallucinations.  During his visit last week they had CT head done planned.  The patient was told this morning that he should contact EMS to transport him to the [facility] emergency room for evaluation and management.  He and his sister expressed understanding and agreement with this plan.  Sandi Mealy, MHS, PA-C Physician Assistant

## 2020-01-08 ENCOUNTER — Telehealth: Payer: Self-pay

## 2020-01-08 ENCOUNTER — Other Ambulatory Visit: Payer: Self-pay

## 2020-01-08 DIAGNOSIS — R531 Weakness: Secondary | ICD-10-CM

## 2020-01-08 DIAGNOSIS — Z7189 Other specified counseling: Secondary | ICD-10-CM

## 2020-01-08 DIAGNOSIS — C439 Malignant melanoma of skin, unspecified: Secondary | ICD-10-CM

## 2020-01-08 DIAGNOSIS — I959 Hypotension, unspecified: Principal | ICD-10-CM

## 2020-01-08 DIAGNOSIS — C7970 Secondary malignant neoplasm of unspecified adrenal gland: Secondary | ICD-10-CM

## 2020-01-08 DIAGNOSIS — Z515 Encounter for palliative care: Secondary | ICD-10-CM

## 2020-01-08 DIAGNOSIS — C786 Secondary malignant neoplasm of retroperitoneum and peritoneum: Secondary | ICD-10-CM

## 2020-01-08 LAB — CBC
HCT: 26 % — ABNORMAL LOW (ref 39.0–52.0)
Hemoglobin: 8.4 g/dL — ABNORMAL LOW (ref 13.0–17.0)
MCH: 29.4 pg (ref 26.0–34.0)
MCHC: 32.3 g/dL (ref 30.0–36.0)
MCV: 90.9 fL (ref 80.0–100.0)
Platelets: 122 10*3/uL — ABNORMAL LOW (ref 150–400)
RBC: 2.86 MIL/uL — ABNORMAL LOW (ref 4.22–5.81)
RDW: 16.3 % — ABNORMAL HIGH (ref 11.5–15.5)
WBC: 4.2 10*3/uL (ref 4.0–10.5)
nRBC: 0 % (ref 0.0–0.2)

## 2020-01-08 LAB — BASIC METABOLIC PANEL
Anion gap: 6 (ref 5–15)
BUN: 23 mg/dL (ref 8–23)
CO2: 16 mmol/L — ABNORMAL LOW (ref 22–32)
Calcium: 7.5 mg/dL — ABNORMAL LOW (ref 8.9–10.3)
Chloride: 111 mmol/L (ref 98–111)
Creatinine, Ser: 1.29 mg/dL — ABNORMAL HIGH (ref 0.61–1.24)
GFR calc Af Amer: >60 mL/min (ref >60)
GFR calc non Af Amer: 57 mL/min — ABNORMAL LOW (ref >60)
Glucose, Bld: 198 mg/dL — ABNORMAL HIGH (ref 70–99)
Potassium: 3.8 mmol/L (ref 3.5–5.1)
Sodium: 133 mmol/L — ABNORMAL LOW (ref 135–145)

## 2020-01-08 LAB — HEMOGLOBIN A1C
Hgb A1c MFr Bld: 8.1 % — ABNORMAL HIGH (ref 4.8–5.6)
Mean Plasma Glucose: 186 mg/dL

## 2020-01-08 LAB — URINE CULTURE: Culture: NO GROWTH

## 2020-01-08 LAB — GLUCOSE, CAPILLARY: Glucose-Capillary: 147 mg/dL — ABNORMAL HIGH (ref 70–99)

## 2020-01-08 LAB — CYTOLOGY - NON PAP

## 2020-01-08 MED ORDER — HYDROMORPHONE HCL 1 MG/ML IJ SOLN: 0.5000 mg | INTRAMUSCULAR | Status: DC | PRN

## 2020-01-08 MED ORDER — LORAZEPAM 2 MG/ML PO CONC: 1.0000 mg | ORAL | Status: DC | PRN

## 2020-01-08 MED ORDER — GLYCOPYRROLATE 0.2 MG/ML IJ SOLN: 0.2000 mg | INTRAMUSCULAR | Status: DC | PRN

## 2020-01-08 MED ORDER — HALOPERIDOL LACTATE 2 MG/ML PO CONC
0.5000 mg | ORAL | Status: DC | PRN
  Filled 2020-01-08: qty 0.3

## 2020-01-08 MED ORDER — GLYCOPYRROLATE 1 MG PO TABS
1.0000 mg | ORAL_TABLET | ORAL | Status: DC | PRN
  Filled 2020-01-08: qty 1

## 2020-01-08 MED ORDER — POLYVINYL ALCOHOL 1.4 % OP SOLN: 1.0000 [drp] | OPHTHALMIC | Status: DC | PRN

## 2020-01-08 MED ORDER — LORAZEPAM 2 MG/ML IJ SOLN
1.0000 mg | INTRAMUSCULAR | Status: DC | PRN
  Administered 2020-01-08 – 2020-01-09 (×2): 1 mg via INTRAVENOUS
  Filled 2020-01-08 (×2): qty 1

## 2020-01-08 MED ORDER — BIOTENE DRY MOUTH MT LIQD: 15.0000 mL | OROMUCOSAL | Status: DC | PRN

## 2020-01-08 MED ORDER — POLYVINYL ALCOHOL 1.4 % OP SOLN
1.0000 [drp] | Freq: Four times a day (QID) | OPHTHALMIC | Status: DC | PRN
  Administered 2020-01-08: 1 [drp] via OPHTHALMIC
  Filled 2020-01-08: qty 15

## 2020-01-08 MED ORDER — HALOPERIDOL 0.5 MG PO TABS
0.5000 mg | ORAL_TABLET | ORAL | Status: DC | PRN
  Filled 2020-01-08: qty 1

## 2020-01-08 MED ORDER — HALOPERIDOL LACTATE 5 MG/ML IJ SOLN: 0.5000 mg | INTRAMUSCULAR | Status: DC | PRN

## 2020-01-08 MED ORDER — LORAZEPAM 1 MG PO TABS
1.0000 mg | ORAL_TABLET | ORAL | Status: DC | PRN
  Administered 2020-01-09 – 2020-01-10 (×2): 1 mg via ORAL
  Filled 2020-01-08 (×2): qty 1

## 2020-01-08 NOTE — Consult Note (Signed)
Consultation Note Date: 01/08/2020   Patient Name: Lee Hall  DOB: [birth year]  MRN: [id number]  Age / Sex: 69 y.o., male  PCP: [name], MD Referring Physician: Kayleen Memos, DO  Reason for Consultation: Establishing goals of care, Hospice Evaluation and Psychosocial/spiritual support  HPI/Patient Profile: 69 y.o. male  with past medical history of metastatic melanoma admitted on 01/07/2020 with decreased functional status and hypotension with concern for sepsis.  Found to have likely brain mets and started on steroids.  He is documented multiple times to multiple providers that he is DNR and wants to discuss goals of care.  Palliative consulted for [place].   Clinical Assessment and Goals of Care: I met today with Lee Hall.  I introduced palliative care as specialized medical care for people living with serious illness. It focuses on providing relief from the symptoms and stress of a serious illness. The goal is to improve quality of life for both the patient and the family.  We discussed clinical course over the past several months including recent decline he has suffered in his nutrition, cognition, and functional status.  Dicussed care plan this hospitalization as well as wishes moving forward in regard to advanced directives.  Concepts specific to code status and rehospitalization discussed.  We discussed difference between a aggressive medical intervention path and a palliative, comfort focused care path.  Values and goals of care important to patient and family were attempted to be  elicited.  He has met this AM with Dr. Alen Blew and is clear in desire for no further disease modifying therapy.  Lee Hall reports today that his is "at peace and ready to die."  Reviewed options for continuation of current interventions (abx, IVF, steroids) and await blood cultures and reassess  following culture results with plan for eventual home with hospice, continuation of current interventions with plan for PT/OT assessment for potential for rehab, or discontinuation of interventions with focus moving forward only on comfort care.  Lee Hall stated that he wanted to stop all interventions and focus only on comfort.  We then specifically discussed continuation of steroids as they may add time and improve his symptoms, but will not "fix" his underlying metastasis.  He reports understanding and would like to d/c steroids at this point as well.  SUMMARY OF RECOMMENDATIONS   - Lee Hall states very clearly that he understands he has a terminal disease, he is ready to die, and he wants only to ensure he does not suffer.  He clearly stated that he wants only to focus comfort care moving forward.  He no longer wants abx, IVF, labs, or other medications.  We specifically discussed risks and benefits of continuation of steroids and that these are likely why he is feeling better today.  He reports that at this point, he would like to discontinue steroids as well.  Plan for focus only on comfort moving forward and will d/c all other interventions following discussion of goals, benefits, and burdens of each intervention.     -  If he survives the night, will plan to reassess and continue conversation regarding next steps moving forward in discharge from the hospital.  He reports that he only willing to consider transition home from the hospital and he is open to hospice services on discharge. - If he survives the night but is unable to make decisions regarding goals, he asked me to call his sister and brother in law (prefernce I speak to Joe first as he thinks this will be less stress on his sister).  Code Status/Advance Care Planning:  DNR   Symptom Management:   Pain/SOB: Dilaudid or oral morphine as needed  Anxiety: Ativan as needed  Agitation: Haldol as needed  Secretions: robinul  as needed  Nausea: Zofran as needed  Additional Recommendations (Limitations, Scope, Preferences):  Full Comfort Care  Prognosis:   Likely weeks at best.  His prognosis is certainly less than 6 months if his disease follows its natural course and he should qualify for hospice services if he survives to discharge.  Discharge Planning: Home with Hospice vs hospital death      Primary Diagnoses: Present on Admission: . Chronic systolic heart failure (HCC) . Implantable cardioverter-defibrillator (ICD) in situ . Melanoma of skin (HCC) . Malignant melanoma metastatic to lymph node (HCC) . Hypotension   I have reviewed the medical record, interviewed the patient and family, and examined the patient. The following aspects are pertinent.  Past Medical History:  Diagnosis Date  . Anxiety   . CAD (coronary artery disease)   . Cardiac arrest (HCC)   . Diabetes mellitus type II, controlled (HCC)   . Heart failure, systolic, acute on chronic (HCC)   . ICD (implantable cardiac defibrillator), single, in situ   . ISCHEMIC CARDIOMYOPATHY 03/19/2009   Qualifier: Diagnosis of  By: Swindell, RN, BSN, Ana    . LEFT VENTRICULAR MURAL THROMBUS 02/18/2009   Qualifier: Diagnosis of  By: Bednar, NP-C, Michelle    . Melanoma (HCC) 2019  . MI (myocardial infarction) (HCC) 08/2003  . PAROXYSMAL VENTRICULAR TACHYCARDIA 03/19/2009   Qualifier: Diagnosis of  By: Swindell, RN, BSN, Ana    . Pneumonia 02/08/2019  . SYSTOLIC HEART FAILURE, ACUTE ON CHRONIC 01/02/2009   Qualifier: Diagnosis of  By: Bednar, NP-C, Michelle    . Ventricular tachycardia (HCC)    Social History   Socioeconomic History  . Marital status: Single    Spouse name: Not on file  . Number of children: Not on file  . Years of education: Not on file  . Highest education level: Not on file  Occupational History  . Occupation: Unemployed since 10/2008  Tobacco Use  . Smoking status: Former Smoker    Packs/day: 1.00    Years:  20.00    Pack years: 20.00    Types: Cigars, Cigarettes    Quit date: 02/22/2004    Years since quitting: 15.8  . Smokeless tobacco: Never Used  Substance and Sexual Activity  . Alcohol use: Not Currently    Comment: Last drink: a year   . Drug use: No  . Sexual activity: Not on file  Other Topics Concern  . Not on file  Social History Narrative   Single   Lives with sister   No regular exercise   Social Determinants of Health   Financial Resource Strain:   . Difficulty of Paying Living Expenses: Not on file  Food Insecurity:   . Worried About Running Out of Food in the Last Year: Not on file  .   Ran Out of Food in the Last Year: Not on file  Transportation Needs:   . Lack of Transportation (Medical): Not on file  . Lack of Transportation (Non-Medical): Not on file  Physical Activity:   . Days of Exercise per Week: Not on file  . Minutes of Exercise per Session: Not on file  Stress:   . Feeling of Stress : Not on file  Social Connections:   . Frequency of Communication with Friends and Family: Not on file  . Frequency of Social Gatherings with Friends and Family: Not on file  . Attends Religious Services: Not on file  . Active Member of Clubs or Organizations: Not on file  . Attends Club or Organization Meetings: Not on file  . Marital Status: Not on file   Family History  Problem Relation Age of Onset  . Alzheimer's disease Mother   . Thyroid disease Mother        uncertain type  . Kidney Stones Father   . Diabetes Father   . Alcohol abuse Father   . Alzheimer's disease Maternal Grandmother    Scheduled Meds: . sodium chloride flush  3 mL Intravenous Q12H   Continuous Infusions: . sodium chloride     PRN Meds:.sodium chloride, acetaminophen **OR** acetaminophen, antiseptic oral rinse, glycopyrrolate **OR** glycopyrrolate **OR** glycopyrrolate, haloperidol **OR** haloperidol **OR** haloperidol lactate, HYDROmorphone (DILAUDID) injection, LORazepam **OR**  LORazepam **OR** LORazepam, LORazepam, morphine CONCENTRATE, ondansetron **OR** ondansetron (ZOFRAN) IV, polyvinyl alcohol, sodium chloride flush Medications Prior to Admission:  Prior to Admission medications   Medication Sig Start Date End Date Taking? Authorizing Provider  acyclovir (ZOVIRAX) 800 MG tablet Take 1 tablet (800 mg total) by mouth 3 (three) times daily. Use for 4-6 days at onset of symptoms Patient taking differently: Take 2,400 mg by mouth once a week. Use for 4-6 days at onset of symptoms 10/29/19  Yes [name], Junell C, MD  amiodarone (PACERONE) 200 MG tablet Take 200 mg by mouth daily.   Yes [provider]  aspirin 81 MG tablet Take 81 mg by mouth daily.     Yes [provider]  carvedilol (COREG) 3.125 MG tablet Take 1.5625 mg by mouth 2 (two) times daily. Take 0.5 tablet (1.5625) BID 09/11/19  Yes [provider]  empagliflozin (JARDIANCE) 10 MG TABS tablet Take 10 mg by mouth daily. 05/04/19  Yes [name], Junell C, MD  ipratropium (ATROVENT) 0.06 % nasal spray USE 2 SPRAYS INTO BOTH NOSTRILS 3 (THREE) TIMES DAILY. Patient taking differently: Place 2 sprays into both nostrils 3 (three) times daily.  07/05/19  Yes [name], Junell C, MD  metFORMIN (GLUCOPHAGE-XR) 500 MG 24 hr tablet TAKE 2 TABLETS (1,000 MG TOTAL) BY MOUTH AT BEDTIME. Patient taking differently: Take 500 mg by mouth 2 (two) times daily.  09/18/19  Yes [name], Junell C, MD  methimazole (TAPAZOLE) 10 MG tablet Take 1 tablet (10 mg total) by mouth daily. 11/15/19  Yes Ellison, Sean, MD  mexiletine (MEXITIL) 150 MG capsule Take 1 capsule (150 mg total) by mouth 3 (three) times daily. 06/19/19  Yes Taylor, Gregg W, MD  sacubitril-valsartan (ENTRESTO) 24-26 MG Take 1 tablet by mouth 2 (two) times daily. 06/19/19  Yes Taylor, Gregg W, MD  sulfamethoxazole-trimethoprim (BACTRIM DS) 800-160 MG tablet Take 1 tablet by mouth 2 (two) times daily. 01/04/20  Yes Tanner, Van E., PA-C  tamsulosin  (FLOMAX) 0.4 MG CAPS capsule Take 1 capsule (0.4 mg total) by mouth daily. 12/19/19  Yes [name], Junell C, MD    blood glucose meter kit and supplies Dispense based on patient and insurance preference. Use up to four times daily as directed. (FOR ICD-10 E10.9, E11.9). 02/14/19   Hongalgi, Lenis Dickinson, MD  MEKINIST 2 MG tablet TAKE 1 TABLET (2 MG TOTAL) BY MOUTH DAILY. TAKE 1 HOUR BEFORE OR 2 HOURS AFTER A MEAL. STORE REFRIGERATED IN ORIGINAL CONTAINER. Patient taking differently: Take 2 mg by mouth daily.  01/02/20   Wyatt Portela, MD  rosuvastatin (CRESTOR) 10 MG tablet Take 1 tablet (10 mg total) by mouth daily. 06/19/19 09/17/19  Evans Lance, MD  TAFINLAR 75 MG capsule TAKE 2 CAPSULES (150 MG TOTAL) BY MOUTH 2 (TWO) TIMES DAILY. TAKE ON AN EMPTY STOMACH 1 HOUR BEFORE OR 2 HOURS AFTER MEALS. Patient taking differently: Take 150 mg by mouth 2 (two) times daily.  01/02/20   Wyatt Portela, MD   No Known Allergies Review of Systems  Constitutional: Positive for activity change and fatigue.  Eyes: Positive for visual disturbance.  Neurological: Positive for dizziness, speech difficulty and weakness.  Psychiatric/Behavioral: Positive for confusion.   Physical Exam General: Alert, awake, in no acute distress.  HEENT: No bruits, no goiter, no JVD Heart: Regular. Lungs: Fair air movement, clear Abdomen: Soft, nondistended, positive bowel sounds.  Ext: No significant edema Skin: Warm and dry  Vital Signs: BP (!) 59/35 (BP Location: Right Arm)   Pulse (!) 41   Temp 97.6 F (36.4 C)   Resp 20   Ht 6' (1.829 m)   Wt 67.3 kg   SpO2 99%   BMI 20.12 kg/m  Pain Scale: 0-10   Pain Score: 0-No pain   SpO2: SpO2: 99 % O2 Device:SpO2: 99 % O2 Flow Rate: .   IO: Intake/output summary:   Intake/Output Summary (Last 24 hours) at 01/08/2020 1126 Last data filed at 01/08/2020 1043 Gross per 24 hour  Intake 745.21 ml  Output --  Net 745.21 ml    LBM: Last BM Date: 01/06/20 Baseline Weight:  Weight: 66.7 kg Most recent weight: Weight: 67.3 kg     Palliative Assessment/Data:   Flowsheet Rows     Most Recent Value  Intake Tab  Referral Department  Hospitalist  Unit at Time of Referral  Med/Surg Unit  Palliative Care Primary Diagnosis  Cancer  Date Notified  01/07/20  Palliative Care Type  New Palliative care  Reason for referral  Clarify Goals of Care, Counsel Regarding Hospice, End of [facility]  Date of Admission  01/07/20  Date first seen by Palliative Care  01/08/20  # of days Palliative referral response time  1 Day(s)  # of days IP prior to Palliative referral  0  Clinical Assessment  Palliative Performance Scale Score  30%  Psychosocial & Spiritual Assessment  Palliative Care Outcomes  Patient/Family meeting held?  Yes  Who was at the meeting?  Patient      Time In: 1000 Time Out: 1120 Time Total: 80 Greater than 50%  of this time was spent counseling and coordinating care related to the above assessment and plan.  Signed by: Micheline Rough, MD   Please contact Palliative Medicine Team phone at [phone] for questions and concerns.  For individual provider: See [name]

## 2020-01-08 NOTE — TOC Progression Note (Signed)
Transition of Care [facility]) - Progression Note    Patient Details  Name: Lee Hall MRN: [id number] Date of Birth: 1951-05-10  Transition of Care [facility]) CM/SW Contact  Purcell Mouton, RN Phone Number: 01/08/2020, 3:24 PM  Clinical Narrative:    Pt's sister Pam here to visit. She stopped to talk to with Dr. Domingo Cocking and CM concerning safety of pt's discharge to home. Pt lives alone on second floor of apartment building with 13 steps. Pt at present time is unable to walk up steps and care for himself. Will continue to follow.         Expected Discharge Plan and Services                                                 Social Determinants of Health (SDOH) Interventions    Readmission Risk Interventions Readmission Risk Prevention Plan 02/14/2019  Transportation Screening Complete  PCP or Specialist Appt within 3-5 Days Complete  HRI or Home Care Consult Complete  Palliative Care Screening Not Applicable  Medication Review (RN Care Manager) Referral to Pharmacy  Some recent data might be hidden

## 2020-01-08 NOTE — Progress Notes (Signed)
Primary nurse called RRT in regards to vital signs and MEWS score. On call provider at bedside discussing code status, end of life goals. Patient  Code status changed to DNR.

## 2020-01-08 NOTE — Progress Notes (Signed)
PROGRESS NOTE  Lee Hall KCM:[id number] DOB: 1951-10-14 DOA: 01/07/2020 PCP: Lee Macadam, MD  HPI/Recap of past 24 hours: Lee Hall is a 69 y.o. male with medical history significant for chronic systolic CHF, non ischemic cardiomyopathy with EF of 20-25%.  Advanced melanoma with documented metastatic disease with peritoneal implants and adrenal involvement since December 2020.  He was started on BRAF targeted therapy and tolerated poorly.  Presented with a chief complaint of lightheadedness, weakness, and urinary incontinence.  On presentation severely hypotensive requiring IV fluid boluses, abnormal labs.  CT head concerning for possible metastatic lesions.  PCCM was consulted.  Patient is alert and oriented x 4 and made decision for comfort measures only.  01/08/20: Seen and examined. He has no new complaints.  Denies any pain at this time.  Seen by palliative care team and plan is to transition to home with hospice services on discharge.   Assessment/Plan: Active Problems:   Nonischemic cardiomyopathy (HCC)   Chronic systolic heart failure (HCC)   Implantable cardioverter-defibrillator (ICD) in situ   Melanoma of skin (HCC)   Malignant melanoma metastatic to lymph node (HCC)   Diabetes mellitus type II, controlled ([place])   Hypotension  Assessment: Severe hypotension, unclear, possibly cardiogenic shock versus adrenal insufficiency versus occult sepsis Recurrent advanced melanoma with suspected metastases Bilateral adrenal metastasis Nonischemic cardiomyopathy with EF 20 to 25%  Plan: Patient has made the decision for comfort measures only.  Our focus is on comfort.   Code Status: DNR/comfort care.  Family Communication: None at bedside  Disposition Plan: Patient is from home.  Anticipate discharge to home with hospice services.  Barrier to discharge: Hospice arrangement.   Consultants:  PCCM.  Palliative care  team.  Procedures:  None  Antimicrobials:  None  DVT prophylaxis: None   Objective: Vitals:   01/07/20 2131 01/07/20 2342 01/07/20 2343 01/08/20 0628  BP: (!) 69/39 (!) 59/38 (!) 58/38 (!) 59/35  Pulse: 94 74  (!) 41  Resp: _0 Temp: 99.1 F (37.3 C) 97.8 F (36.6 C)  97.6 F (36.4 C)  TempSrc: Oral Oral    SpO2: 93% 98%  99%  Weight:      Height:        Intake/Output Summary (Last 24 hours) at 01/08/2020 1644 Last data filed at 01/08/2020 1400 Gross per 24 hour  Intake 745.21 ml  Output --  Net 745.21 ml   Filed Weights   01/07/20 1148 01/07/20 2104  Weight: 66.7 kg 67.3 kg    Exam:  . General: 70 y.o. year-old male well developed well nourished in no acute distress.  Alert and oriented x4. . Cardiovascular: Regular rate and rhythm with no rubs or gallops.  No thyromegaly or JVD noted.   Marland Kitchen Respiratory: Clear to auscultation with no wheezes or rales. Good inspiratory effort. . Abdomen: Soft nontender nondistended with normal bowel sounds x4 quadrants. . Musculoskeletal: No lower extremity edema. 2/4 pulses in all 4 extremities. Marland Kitchen Psychiatry: Mood is appropriate for condition and setting   Data Reviewed: CBC: Recent Labs  Lab 01/04/20 1206 01/07/20 1245 01/08/20 0326  WBC 3.7* 5.0 4.2  NEUTROABS 2.9 4.0  --   HGB 12.0* 10.2* 8.4*  HCT 35.6* 30.6* 26.0*  MCV 86.4 87.4 90.9  PLT 95* 137* 056*   Basic Metabolic Panel: Recent Labs  Lab 01/04/20 1206 01/07/20 1245 01/08/20 0326  NA 130* 129* 133*  K 3.5 3.2* 3.8  CL 99 102 111  CO2 19* 18* 16*  GLUCOSE 270* 153* 198*  BUN 33* 27* 23  CREATININE 1.32* 1.29* 1.29*  CALCIUM 8.1* 8.2* 7.5*  MG  --  2.4  --    GFR: Estimated Creatinine Clearance: 53.6 mL/min (A) (by C-G formula based on SCr of 1.29 mg/dL (H)). Liver Function Tests: Recent Labs  Lab 01/04/20 1206 01/07/20 1245  AST 216* 59*  ALT 506* 193*  ALKPHOS 205* 153*  BILITOT 0.9 0.8  PROT 5.7* 5.4*  ALBUMIN 2.8* 2.6*    Recent Labs  Lab 01/07/20 1245  LIPASE 285*   Recent Labs  Lab 01/07/20 1246  AMMONIA 14   Coagulation Profile: Recent Labs  Lab 01/07/20 1245  INR 1.0   Cardiac Enzymes: No results for input(s): CKTOTAL, CKMB, CKMBINDEX, TROPONINI in the last 168 hours. BNP (last 3 results) Recent Labs    01/31/19 1628 02/22/19 1006  PROBNP 1,698.0* 2,169*   HbA1C: No results for input(s): HGBA1C in the last 72 hours. CBG: Recent Labs  Lab 01/07/20 2336 01/08/20 0754  GLUCAP 217* 147*   Lipid Profile: No results for input(s): CHOL, HDL, LDLCALC, TRIG, CHOLHDL, LDLDIRECT in the last 72 hours. Thyroid Function Tests: Recent Labs    01/07/20 1246  TSH 3.722   Anemia Panel: No results for input(s): VITAMINB12, FOLATE, FERRITIN, TIBC, IRON, RETICCTPCT in the last 72 hours. Urine analysis:    Component Value Date/Time   COLORURINE YELLOW 01/07/2020 [place] 01/07/2020 1245   LABSPEC 1.024 01/07/2020 1245   PHURINE 6.0 01/07/2020 1245   GLUCOSEU >=500 (A) 01/07/2020 1245   HGBUR NEGATIVE 01/07/2020 1245   BILIRUBINUR NEGATIVE 01/07/2020 1245   BILIRUBINUR negative 12/14/2019 1647   KETONESUR 5 (A) 01/07/2020 1245   PROTEINUR 30 (A) 01/07/2020 1245   UROBILINOGEN 1.0 12/14/2019 1647   NITRITE NEGATIVE 01/07/2020 1245   LEUKOCYTESUR NEGATIVE 01/07/2020 1245   Sepsis Labs: _0 (procalcitonin:4,lacticidven:4)  ) Recent Results (from the past 240 hour(s))  Urine Culture     Status: None   Collection Time: 01/04/20 12:30 PM   Specimen: Urine, Clean Catch  Result Value Ref Range Status   Specimen Description   Final    URINE, CLEAN CATCH Performed at [facility], [place] [address], [place], [place] [address]    Special Requests   Final    NONE Performed at [facility], [place] [address], [place], [place] [address]    Culture   Final    NO GROWTH Performed at [facility], [place] [address] [place], [place] [address]    Report Status 01/05/2020 FINAL  Final  Urine culture     Status: None   Collection Time: 01/07/20 12:45 PM   Specimen: Urine, Clean Catch  Result Value Ref Range Status   Specimen Description   Final    URINE, CLEAN CATCH Performed at [facility], [place] [address] [place], [place] [address]    Special Requests   Final    NONE Performed at [facility], [place] [address], [place], [place] [address]    Culture   Final    NO GROWTH Performed at [facility], [place] [address] [place], [place] [address]    Report Status 01/08/2020 FINAL  Final  Blood culture (routine x 2)     Status: None (Preliminary result)   Collection Time: 01/07/20 12:45 PM   Specimen: BLOOD  Result Value Ref Range Status   Specimen  Description   Final    BLOOD LEFT ANTECUBITAL Performed at [place] [address], [place], [place] [address]    Special Requests   Final    BOTTLES DRAWN AEROBIC AND ANAEROBIC Blood Culture adequate volume Performed at [place] [address] [place], [place] [address]    Culture   Final    NO GROWTH < 24 HOURS Performed at [place] [address] [place], [place] [address]    Report Status PENDING  Incomplete  Respiratory Panel by RT PCR (Flu A&B, Covid) - Nasopharyngeal Swab     Status: None   Collection Time: 01/07/20 12:46 PM   Specimen: Nasopharyngeal Swab  Result Value Ref Range Status   SARS Coronavirus 2 by RT PCR NEGATIVE NEGATIVE Final    Comment: (NOTE) SARS-CoV-2 target nucleic acids are NOT DETECTED. The SARS-CoV-2 RNA is generally detectable in upper respiratoy specimens during the acute phase of infection. The lowest concentration of SARS-CoV-2 viral copies this assay can detect is 131 copies/mL. A negative result does not preclude SARS-Cov-2 infection and should not be used as the sole basis for treatment or other patient  management decisions. A negative result may occur with  improper specimen collection/handling, submission of specimen other than nasopharyngeal swab, presence of viral mutation(s) within the areas targeted by this assay, and inadequate number of viral copies (<131 copies/mL). A negative result must be combined with clinical observations, patient history, and epidemiological information. The expected result is Negative. Fact Sheet for Patients:  PinkCheek.be Fact Sheet for Healthcare Providers:  GravelBags.it This test is not yet ap proved or cleared by the [place] FDA and  has been authorized for detection and/or diagnosis of SARS-CoV-2 by FDA under an Emergency Use Authorization (EUA). This EUA will remain  in effect (meaning this test can be used) for the duration of the COVID-19 declaration under Section 564(b)(1) of the Act, 21 U.S.C. section 360bbb-3(b)(1), unless the authorization is terminated or revoked sooner.    Influenza A by PCR NEGATIVE NEGATIVE Final   Influenza B by PCR NEGATIVE NEGATIVE Final    Comment: (NOTE) The Xpert Xpress SARS-CoV-2/FLU/RSV assay is intended as an aid in  the diagnosis of influenza from Nasopharyngeal swab specimens and  should not be used as a sole basis for treatment. Nasal washings and  aspirates are unacceptable for Xpert Xpress SARS-CoV-2/FLU/RSV  testing. Fact Sheet for Patients: PinkCheek.be Fact Sheet for Healthcare Providers: GravelBags.it This test is not yet approved or cleared by the [place] FDA and  has been authorized for detection and/or diagnosis of SARS-CoV-2 by  FDA under an Emergency Use Authorization (EUA). This EUA will remain  in effect (meaning this test can be used) for the duration of the  Covid-19 declaration under Section 564(b)(1) of the Act, 21  U.S.C. section 360bbb-3(b)(1), unless the  authorization is  terminated or revoked. Performed at [facility], [place] [address], [place], [place] [address]   Blood culture (routine x 2)     Status: None (Preliminary result)   Collection Time: 01/07/20  1:30 PM   Specimen: BLOOD  Result Value Ref Range Status   Specimen Description   Final    BLOOD BLOOD LEFT FOREARM Performed at [place] [address] [place], [place] [address]    Special Requests   Final    BOTTLES DRAWN AEROBIC ONLY Blood Culture results may not be optimal due to an inadequate volume of  blood received in culture bottles Performed at [facility], [place] [address] [place], [place] [address]    Culture   Final    NO GROWTH < 24 HOURS Performed at [place] [address], [place], [place] [address]    Report Status PENDING  Incomplete      Studies: No results found.  Scheduled Meds: . sodium chloride flush  3 mL Intravenous Q12H    Continuous Infusions: . sodium chloride       LOS: 1 day     Kayleen Memos, MD [facility] Pager [phone]  If 7PM-7AM, please contact night-coverage [email or web address] Password [facility] 01/08/2020, 4:44 PM

## 2020-01-08 NOTE — Telephone Encounter (Signed)
Confirmed with BS rep that patient's therapies have been turned off.

## 2020-01-08 NOTE — TOC Progression Note (Signed)
Transition of Care [facility]) - Progression Note    Patient Details  Name: Lee Hall MRN: [id number] Date of Birth: Apr 21, 1951  Transition of Care [facility]) CM/SW Contact  Purcell Mouton, RN Phone Number: 01/08/2020, 2:52 PM  Clinical Narrative:    Pt's sister called RN to say pt is not safe to go home and that home is not safe. Pt had told this CM that she could not call his sister. However, with RN telling me Spoke with pt's Pam sister concerning discharge plans. Pam states that pt can not go live at home alone. Will continue to follow.        Expected Discharge Plan and Services                                                 Social Determinants of Health (SDOH) Interventions    Readmission Risk Interventions Readmission Risk Prevention Plan 02/14/2019  Transportation Screening Complete  PCP or Specialist Appt within 3-5 Days Complete  HRI or Home Care Consult Complete  Palliative Care Screening Not Applicable  Medication Review (RN Care Manager) Referral to Pharmacy  Some recent data might be hidden

## 2020-01-08 NOTE — Progress Notes (Signed)
Nutrition Brief Note  Patient screened for MST. Chart reviewed. Palliative Care met with patient earlier today at which time patient desired to discontinue all interventions, including steroids.  Pt now transitioning to full comfort care with prognosis of weeks, at most.   No further nutrition interventions warranted at this time.  Please re-consult as needed.     Jarome Matin, MS, RD, LDN, CNSC Inpatient Clinical Dietitian RD pager # available in [place]  After hours/weekend pager # available in [facility]

## 2020-01-08 NOTE — Progress Notes (Signed)
Hydrologist [facility]) Hospital Liaison: RN note.  Notified by Transition of [place], RN of  patient/family request for [facility] services at home after discharge.  Spoke with Dr. Domingo Cocking who advised we wait until Wednesday to engage with patient.  [facility] hospital liaison will follow up Wednesday morning to confirm interest and arrange for home hospice services if patient is in agreement.  Please call with any hospice related questions.     Thank you for this referral.      Farrel Gordon, RN, [facility] (listed on AMION under Hospice and [place] [facility] [place])   [phone]

## 2020-01-08 NOTE — Telephone Encounter (Signed)
Received message via MyChart from Pt requesting his therapies be turned off.  Pt is currently at [facility] hospital with plan to discharge home on hospice d/t metastatic cancer.  Spoke with Dr. Lovena Le.  Received verbal order from Dr. Lovena Le approving turning off ICD therapies.  Will forward to device clinic to send representative to [facility] to turn off therapies.

## 2020-01-08 NOTE — Telephone Encounter (Signed)
Spoke with Karleen Dolphin, Public librarian. As verbal order to turn off therapies from Dr. Lovena Le is documented, Heron Sabins will proceed to [facility] to reprogram patient's ICD. Expects to be there within the hour. Sonia Baller, RN, aware and will make patient aware.

## 2020-01-08 NOTE — TOC Progression Note (Signed)
Transition of Care [facility]) - Progression Note    Patient Details  Name: Lee Hall MRN: [id number] Date of Birth: 23-Jul-1951  [facility]) CM/SW Contact  Purcell Mouton, RN Phone Number: 01/08/2020, 12:06 PM  Clinical Narrative:    Spoke with pt concerning Home with Hospice. [facility] [place] was selected. Referral given to in house rep Audrea Muscat, RN with [facility]. Plan to discharge in AM.         Expected Discharge Plan and Services                                                 Social Determinants of Health (SDOH) Interventions    Readmission Risk Interventions Readmission Risk Prevention Plan 02/14/2019  Transportation Screening Complete  PCP or Specialist Appt within 3-5 Days Complete  HRI or [place] Complete  Palliative Care Screening Not Applicable  Medication Review (RN Care Manager) Referral to Pharmacy  Some recent data might be hidden

## 2020-01-08 NOTE — Progress Notes (Signed)
EPIC Encounter for ICM Monitoring  Patient Name: Lee Hall is a 69 y.o. male Date: 01/08/2020 Primary Care Physican: [name], MD Primary Cardiologist:Taylor Electrophysiologist:Taylor Weight: unknown   Patiently currently hospitalized and per hospital note will be discharged home with hospice care due to metastatic CA.  Epic note states Dr Lovena Le ordered ICD therapy to be deactivated as requested per patient.    HeartLogic Heart Failure Index17which is within normal threshold.    Prescribed:No diuretic  Recommendations:None  Follow-up plan: ICM monthly follow up discontinued due to patient will be under the care of hospice.  Copy of ICM check sent to Dr.Taylor.          Rosalene Billings, RN 01/08/2020 1:50 PM

## 2020-01-08 NOTE — Progress Notes (Signed)
IP PROGRESS NOTE  Subjective:   [name] is known to me with a history of advanced melanoma with documented metastatic disease with peritoneal implants and adrenal involvement since December 2020.  He was started on BRAF targeted therapy and tolerated it poorly.  He was hospitalized for symptoms of weakness and hypotension.  Clinically, he reports feeling better this morning but still overall weak.  He does report to neurological symptoms predominantly dizziness and unsteadiness and visual changes.  CT scan of the brain showed increased attenuation in the left parietal region subcortical area of the right temporal lobe suspicious for metastatic disease.  He has refused MRI at this time.  Objective:  Vital signs in last 24 hours: Temp:  [96.1 F (35.6 C)-101.1 F (38.4 C)] 97.6 F (36.4 C) (02/09 0628) Pulse Rate:  [41-108] 41 (02/09 0628) Resp:  [14-30] 20 (02/09 0628) BP: (58-95)/(35-74) 59/35 (02/09 0628) SpO2:  [69 %-100 %] 99 % (02/09 0628) Weight:  [147 lb (66.7 kg)-148 lb 5.9 oz (67.3 kg)] 148 lb 5.9 oz (67.3 kg) (02/08 2104) Weight change:  Last BM Date: 01/06/20  Intake/Output from previous day: 02/08 0701 - 02/09 0700 In: 742.2 [I.V.:490.2; IV Piggyback:252] Out: -  General: Alert, awake without distress. Head: Normocephalic atraumatic. Mouth: mucous membranes moist, pharynx normal without lesions Eyes: No scleral icterus.  Pupils are equal and round reactive to light. Resp: clear to auscultation bilaterally without rhonchi or wheezes or dullness to percussion. Cardio: regular rate and rhythm, S1, S2 normal, no murmur, click, rub or gallop GI: soft, non-tender; bowel sounds normal; no masses,  no organomegaly Musculoskeletal: No joint deformity or effusion. Neurological: No specific deficits noted at this time. Lab Results: Recent Labs    01/07/20 1245 01/08/20 0326  WBC 5.0 4.2  HGB 10.2* 8.4*  HCT 30.6* 26.0*  PLT 137* 122*    BMET Recent Labs     01/07/20 1245 01/08/20 0326  NA 129* 133*  K 3.2* 3.8  CL 102 111  CO2 18* 16*  GLUCOSE 153* 198*  BUN 27* 23  CREATININE 1.29* 1.29*  CALCIUM 8.2* 7.5*    Studies/Results: CT Head Wo Contrast  Addendum Date: 01/07/2020   ADDENDUM REPORT: 01/07/2020 16:56 ADDENDUM: Critical Value/emergent results were called by telephone at the time of interpretation on 01/07/2020 at 4:50 pm to Dr. Marda Stalker , who verbally acknowledged these results. Electronically Signed   By: Inez Catalina M.D.   On: 01/07/2020 16:56   Result Date: 01/07/2020 CLINICAL DATA:  Visual difficulties with dizziness, history of metastatic melanoma EXAM: CT HEAD WITHOUT CONTRAST TECHNIQUE: Contiguous axial images were obtained from the base of the skull through the vertex without intravenous contrast. COMPARISON:  05/03/2019 FINDINGS: Brain: No findings to suggest acute hemorrhage are identified. In the deep white matter on the left as well as within the subcortical area of the right temporal lobe region, there are ovoid appearing hyperdensities identified which were not seen on the prior exam. Some minimal surrounding edema is noted in the lesion on the left. No other definitive hyperdensities are seen. Vascular: No hyperdense vessel or unexpected calcification. Skull: Normal. Negative for fracture or focal lesion. Sinuses/Orbits: No acute finding. Other: None. IMPRESSION: New ovoid areas of increased attenuation deep white matter in the left parietal region and subcortical area of the right temporal lobe suspicious for metastatic lesions. MRI of the brain with and without contrast is recommended when clinically able. Given the patient's pacing device, this may need coordination and scheduling with  Cardiology if possible. Electronically Signed: By: Inez Catalina M.D. On: 01/07/2020 16:53   DG Chest Portable 1 View  Result Date: 01/07/2020 CLINICAL DATA:  History of metastatic melanoma with generalized weakness for 1 week. EXAM:  PORTABLE CHEST 1 VIEW COMPARISON:  09/26/2019 FINDINGS: The cardiac silhouette, mediastinal and hilar contours are within normal limits and stable. The pacer wires/AICD are unchanged. No complicating features. The lungs are clear. No infiltrates, edema or effusions. No pneumothorax. The bony thorax is intact. IMPRESSION: No acute cardiopulmonary findings. Electronically Signed   By: Marijo Sanes M.D.   On: 01/07/2020 14:12    Medications: I have reviewed the patient's current medications.  Assessment/Plan:  69 year old man with:  1.  Advanced melanoma with diffuse metastasis documented since December 2020.  He attempted to take it BRAF targeted therapy and tolerated it poorly.  He has decided against any treatment at this time and would like to pursue supportive care only.  The natural course of this disease was reviewed today as well as overall prognosis.  He understands he has limited life expectancy at this time and any treatment will be palliative in any case which has been discussed with him previously.  I support his decision and no anticancer treatment will be given.  2.  Abnormal CT scan and neurological symptoms: It is unclear whether any imaging of the brain will add any value at this time.  He understands that he might have metastatic disease to the brain that will not be treated.  3.  Prognosis and goals of care: Prognosis is poor with limited life expectancy.  He would be a candidate for hospice enrollment upon discharge.  4.  Disposition: I have no objections to discharge once cleared by the primary team.  Discharge home with hospice versus skilled nursing facility pending is mobility and safety to discharge.   25  minutes was spent with the patient face-to-face today.  More than 50% of time was spent on reviewing imaging studies, disease status update as well as answering questions about prognosis and future plan of care.    LOS: 1 day   Zola Button 01/08/2020, 8:02 AM

## 2020-01-09 ENCOUNTER — Telehealth: Payer: Self-pay | Admitting: Family Medicine

## 2020-01-09 MED ORDER — METHIMAZOLE 10 MG PO TABS
10.0000 mg | ORAL_TABLET | Freq: Every day | ORAL | Status: DC
  Administered 2020-01-09 – 2020-01-11 (×3): 10 mg via ORAL
  Filled 2020-01-09 (×3): qty 1

## 2020-01-09 MED ORDER — DEXAMETHASONE 4 MG PO TABS
4.0000 mg | ORAL_TABLET | Freq: Four times a day (QID) | ORAL | Status: DC
  Administered 2020-01-09 – 2020-01-11 (×9): 4 mg via ORAL
  Filled 2020-01-09 (×8): qty 1

## 2020-01-09 MED ORDER — CANAGLIFLOZIN 100 MG PO TABS
100.0000 mg | ORAL_TABLET | Freq: Every day | ORAL | Status: DC
  Administered 2020-01-10: 100 mg via ORAL
  Filled 2020-01-09: qty 1

## 2020-01-09 NOTE — Telephone Encounter (Signed)
Gar Ponto from [facility] wanted to inform Dr. Tod Persia that pt has been referred to their facility under palliative care.

## 2020-01-09 NOTE — Plan of Care (Signed)
Pt's questions regarding his on-going care were answered successfully and he looks forward to speaking with Hospice and Palliative representatives.

## 2020-01-09 NOTE — NC FL2 (Signed)
[place] LEVEL OF CARE SCREENING TOOL     IDENTIFICATION  Patient Name: Lee Hall Birthdate: February 13, 1951 Sex: male Admission Date (Current Location): 01/07/2020  [facility] and [place] Number:  Herbalist and Address:  [facility],  [facility] [place], [place]      Provider Number: [id number]  Attending Physician Name and Address:  Georgette Shell, MD  Relative Name and Phone Number:  [name] Bachelor [phone]    Current Level of Care: Hospital Recommended Level of Care: [place] Prior Approval Number:    Date Approved/Denied:   PASRR Number: [id number]  Discharge Plan: SNF    Current Diagnoses: Patient Active Problem List   Diagnosis Date Noted  . Hypotension 01/07/2020  . Hyperthyroidism 11/15/2019  . Aortic ectasia, abdominal ([place]) 05/11/2019  . Upper airway cough syndrome 04/13/2019  . Diabetes mellitus type II, controlled ([place]) 02/23/2019  . ARF (acute renal failure) ([place]) 02/23/2019  . DOE (dyspnea on exertion) 02/22/2019  . Pneumonia of right lower lobe due to infectious organism 02/22/2019  . Lower extremity edema 02/22/2019  . Healthcare maintenance 02/22/2019  . Coronary artery disease involving native coronary artery of native heart without angina pectoris   . S/P placement of cardiac pacemaker   . Atrial fibrillation ([place])   . History of sustained ventricular tachycardia   . Acute hypoxemic respiratory failure ([place])   . Community acquired pneumonia of right lung   . Severe sepsis ([place])   . Septic shock ([place]) 02/01/2019  . Malignant melanoma metastatic to lymph node ([place]) 10/05/2018  . Tremor of right hand 08/07/2018  . Melanoma of skin ([place]) 08/07/2018  . Abnormal positron emission tomography (PET) scan 08/07/2018  . Nonischemic cardiomyopathy ([employer]) 03/19/2009  . Ventricular tachycardia ([place]) 03/19/2009  . CAD, NATIVE VESSEL 02/18/2009  . LEFT VENTRICULAR MURAL THROMBUS  02/18/2009  . Chronic systolic heart failure ([place]) 01/02/2009  . TACHYCARDIA 01/02/2009  . Implantable cardioverter-defibrillator (ICD) in situ 01/02/2009    Orientation RESPIRATION BLADDER Height & Weight     Self, Time, Situation, Place  Normal Continent, External catheter Weight: 67.3 kg Height:  6' (182.9 cm)  BEHAVIORAL SYMPTOMS/MOOD NEUROLOGICAL BOWEL NUTRITION STATUS      Continent Diet(Regular)  AMBULATORY STATUS COMMUNICATION OF NEEDS Skin   Extensive Assist Verbally Other (Comment)(left skin tear)                       Personal Care Assistance Level of Assistance  Bathing, Feeding, Dressing Bathing Assistance: Limited assistance Feeding assistance: Independent Dressing Assistance: Limited assistance     Functional Limitations Info  Sight, Hearing, Speech Sight Info: Adequate Hearing Info: Adequate Speech Info: Adequate    SPECIAL CARE FACTORS FREQUENCY  PT (By licensed PT), OT (By licensed OT)     PT Frequency: Eval and treat OT Frequency: Eval and treat            Contractures Contractures Info: Not present    Additional Factors Info  Code Status, Allergies Code Status Info: DNR Allergies Info: No Known Allergies           Current Medications (01/09/2020):  This is the current hospital active medication list Current Facility-Administered Medications  Medication Dose Route Frequency Provider Last Rate Last Admin  . 0.9 %  sodium chloride infusion  250 mL Intravenous PRN Cristescu, Mircea G, MD      . acetaminophen (TYLENOL) tablet 650 mg  650 mg Oral Q6H PRN Cristescu,  Linard Millers, MD   650 mg at 01/07/20 2110   Or  . acetaminophen (TYLENOL) suppository 650 mg  650 mg Rectal Q6H PRN Cristescu, Mircea G, MD      . antiseptic oral rinse (BIOTENE) solution 15 mL  15 mL Topical PRN Domingo Cocking, Gene, MD      . dexamethasone (DECADRON) tablet 4 mg  4 mg Oral Q6H Freeman, Gene, MD      . glycopyrrolate (ROBINUL) tablet 1 mg  1 mg Oral Q4H PRN Micheline Rough, MD       Or  . glycopyrrolate (ROBINUL) injection 0.2 mg  0.2 mg Subcutaneous Q4H PRN Micheline Rough, MD       Or  . glycopyrrolate (ROBINUL) injection 0.2 mg  0.2 mg Intravenous Q4H PRN Domingo Cocking, Gene, MD      . haloperidol (HALDOL) tablet 0.5 mg  0.5 mg Oral Q4H PRN Micheline Rough, MD       Or  . haloperidol (HALDOL) 2 MG/ML solution 0.5 mg  0.5 mg Sublingual Q4H PRN Micheline Rough, MD       Or  . haloperidol lactate (HALDOL) injection 0.5 mg  0.5 mg Intravenous Q4H PRN Domingo Cocking, Gene, MD      . HYDROmorphone (DILAUDID) injection 0.5 mg  0.5 mg Intravenous Q2H PRN Domingo Cocking, Gene, MD      . LORazepam (ATIVAN) tablet 1 mg  1 mg Oral Q4H PRN Micheline Rough, MD       Or  . LORazepam (ATIVAN) 2 MG/ML concentrated solution 1 mg  1 mg Sublingual Q4H PRN Micheline Rough, MD       Or  . LORazepam (ATIVAN) injection 1 mg  1 mg Intravenous Q4H PRN Micheline Rough, MD   1 mg at 01/09/20 0356  . LORazepam (ATIVAN) injection 2 mg  2 mg Intravenous Q4H PRN Cristescu, Mircea G, MD      . morphine CONCENTRATE 10 MG/0.5ML oral solution 10 mg  10 mg Oral Q4H PRN Cristescu, Linard Millers, MD   10 mg at 01/09/20 0356  . ondansetron (ZOFRAN) tablet 4 mg  4 mg Oral Q6H PRN Cristescu, Mircea G, MD       Or  . ondansetron (ZOFRAN) injection 4 mg  4 mg Intravenous Q6H PRN Cristescu, Mircea G, MD      . polyvinyl alcohol (LIQUIFILM TEARS) 1.4 % ophthalmic solution 1 drop  1 drop Both Eyes QID PRN Micheline Rough, MD   1 drop at 01/08/20 2007  . sodium chloride flush (NS) 0.9 % injection 3 mL  3 mL Intravenous Q12H Cristescu, Mircea G, MD   3 mL at 01/08/20 2225  . sodium chloride flush (NS) 0.9 % injection 3 mL  3 mL Intravenous PRN Cristescu, Linard Millers, MD         Discharge Medications: Please see discharge summary for a list of discharge medications.  Relevant Imaging Results:  Relevant Lab Results:   Additional Information [phone]  Purcell Mouton, RN

## 2020-01-09 NOTE — Progress Notes (Signed)
PROGRESS NOTE    Lee Hall  CLE:[id number] DOB: 1951-06-12 DOA: 01/07/2020 PCP: [name], MD   Brief Narrative: 69 y.o.malewith medical history significant for chronic systolic CHF, non ischemic cardiomyopathy with EF of 20-25%.  Advanced melanoma with documented metastatic disease with peritoneal implants and adrenal involvement since December 2020.  He was started on BRAF targeted therapy and tolerated poorly.  Presented with a chief complaint of lightheadedness, weakness, and urinary incontinence. On presentation severely hypotensive requiring IV fluid boluses, abnormal labs.  CT head concerning for possible metastatic lesions.  PCCM was consulted.  Patient is alert and oriented x 4 and made decision for comfort measures only.  Assessment & Plan:   Active Problems:   Nonischemic cardiomyopathy (HCC)   Chronic systolic heart failure (HCC)   Implantable cardioverter-defibrillator (ICD) in situ   Melanoma of skin (HCC)   Malignant melanoma metastatic to lymph node (HCC)   Diabetes mellitus type II, controlled ([place])   Hypotension  #1 severe hypotension blood pressure earlier today 59/35 now up to 136/78 with a heart rate of 97.  The cause of hypotension is unclear.  No evidence of sepsis.  Possibly cardiogenic shock or adrenal insufficiency in the setting of recurrent advanced melanoma with metastatic with mets to adrenal glands. Patient lives alone and is bedbound at home and he does not have anyone to take care of him at home.  He wants to go to rehab and see if he can regain some of his strength so he can return home. PT OT consults placed.  #2 nonischemic cardiomyopathy ejection fraction 20 to 25%.  #3 type 2 diabetes Invokana restarted.  #4 hypothyroidism on methimazole restarted.     Estimated body mass index is 20.12 kg/m as calculated from the following:   Height as of this encounter: 6' (1.829 m).   Weight as of this encounter: 67.3 kg.  DVT  prophylaxis: SCD  code Status: DNR Family Communication: None Disposition Plan: Patient came from home but unable to return home as patient was bedbound and no family members available for 24-hour care.  PT OT consult placed he will likely will need to go to rehab.  Barriers  PT evaluation and finding a placement   Consultants:   pccm palliative  Procedures:none Antimicrobialsnone  Subjective: Resting in bed appears comfortable  Objective: Vitals:   01/07/20 2131 01/07/20 2342 01/07/20 2343 01/08/20 0628  BP: (!) 69/39 (!) 59/38 (!) 58/38 (!) 59/35  Pulse: 94 74  (!) 41  Resp: '20 20  20  ' Temp: 99.1 F (37.3 C) 97.8 F (36.6 C)  97.6 F (36.4 C)  TempSrc: Oral Oral    SpO2: 93% 98%  99%  Weight:      Height:        Intake/Output Summary (Last 24 hours) at 01/09/2020 1350 Last data filed at 01/09/2020 0600 Gross per 24 hour  Intake 120 ml  Output 975 ml  Net -855 ml   Filed Weights   01/07/20 1148 01/07/20 2104  Weight: 66.7 kg 67.3 kg    Examination:  General exam: Appears calm and comfortable  Respiratory system: Clear to auscultation. Respiratory effort normal. Cardiovascular system: S1 & S2 heard, RRR. No JVD, murmurs, rubs, gallops or clicks. No pedal edema. Gastrointestinal system: Abdomen is nondistended, soft and nontender. No organomegaly or masses felt. Normal bowel sounds heard. Central nervous system: Alert and oriented. No focal neurological deficits. Extremities: Symmetric 5 x 5 power. Skin: No rashes, lesions or ulcers Psychiatry:  Judgement and insight appear normal. Mood & affect appropriate.     Data Reviewed: I have personally reviewed following labs and imaging studies  CBC: Recent Labs  Lab 01/04/20 1206 01/07/20 1245 01/08/20 0326  WBC 3.7* 5.0 4.2  NEUTROABS 2.9 4.0  --   HGB 12.0* 10.2* 8.4*  HCT 35.6* 30.6* 26.0*  MCV 86.4 87.4 90.9  PLT 95* 137* 131*   Basic Metabolic Panel: Recent Labs  Lab 01/04/20 1206 01/07/20 1245  01/08/20 0326  NA 130* 129* 133*  K 3.5 3.2* 3.8  CL 99 102 111  CO2 19* 18* 16*  GLUCOSE 270* 153* 198*  BUN 33* 27* 23  CREATININE 1.32* 1.29* 1.29*  CALCIUM 8.1* 8.2* 7.5*  MG  --  2.4  --    GFR: Estimated Creatinine Clearance: 53.6 mL/min (A) (by C-G formula based on SCr of 1.29 mg/dL (H)). Liver Function Tests: Recent Labs  Lab 01/04/20 1206 01/07/20 1245  AST 216* 59*  ALT 506* 193*  ALKPHOS 205* 153*  BILITOT 0.9 0.8  PROT 5.7* 5.4*  ALBUMIN 2.8* 2.6*   Recent Labs  Lab 01/07/20 1245  LIPASE 285*   Recent Labs  Lab 01/07/20 1246  AMMONIA 14   Coagulation Profile: Recent Labs  Lab 01/07/20 1245  INR 1.0   Cardiac Enzymes: No results for input(s): CKTOTAL, CKMB, CKMBINDEX, TROPONINI in the last 168 hours. BNP (last 3 results) Recent Labs    01/31/19 1628 02/22/19 1006  PROBNP 1,698.0* 2,169*   HbA1C: Recent Labs    01/07/20 2132  HGBA1C 8.1*   CBG: Recent Labs  Lab 01/07/20 2336 01/08/20 0754  GLUCAP 217* 147*   Lipid Profile: No results for input(s): CHOL, HDL, LDLCALC, TRIG, CHOLHDL, LDLDIRECT in the last 72 hours. Thyroid Function Tests: Recent Labs    01/07/20 1246  TSH 3.722   Anemia Panel: No results for input(s): VITAMINB12, FOLATE, FERRITIN, TIBC, IRON, RETICCTPCT in the last 72 hours. Sepsis Labs: Recent Labs  Lab 01/07/20 1245  LATICACIDVEN 1.3    Recent Results (from the past 240 hour(s))  Urine Culture     Status: None   Collection Time: 01/04/20 12:30 PM   Specimen: Urine, Clean Catch  Result Value Ref Range Status   Specimen Description   Final    URINE, CLEAN CATCH Performed at [facility], [place] [address] [place], [place] [address]    Special Requests   Final    NONE Performed at [facility], [place] [address], [place], [place] [address]    Culture   Final    NO GROWTH Performed at [facility], [place] [address], [place], [place] [address]     Report Status 01/05/2020 FINAL  Final  Urine culture     Status: None   Collection Time: 01/07/20 12:45 PM   Specimen: Urine, Clean Catch  Result Value Ref Range Status   Specimen Description   Final    URINE, CLEAN CATCH Performed at [facility], [place] [address] [place], [place] [address]    Special Requests   Final    NONE Performed at [facility], [place] [address], [place], [place] [address]    Culture   Final    NO GROWTH Performed at [facility], [place] [address], [place], [place] [address]    Report Status 01/08/2020 FINAL  Final  Blood culture (routine x 2)     Status: None (Preliminary result)  Collection Time: 01/07/20 12:45 PM   Specimen: BLOOD  Result Value Ref Range Status   Specimen Description   Final    BLOOD LEFT ANTECUBITAL Performed at [place] [address] [place], [place] [address]    Special Requests   Final    BOTTLES DRAWN AEROBIC AND ANAEROBIC Blood Culture adequate volume Performed at [place] [address] [place], [place] [address]    Culture   Final    NO GROWTH 2 DAYS Performed at [place] [address] [place], [place] [address]    Report Status PENDING  Incomplete  Respiratory Panel by RT PCR (Flu A&B, Covid) - Nasopharyngeal Swab     Status: None   Collection Time: 01/07/20 12:46 PM   Specimen: Nasopharyngeal Swab  Result Value Ref Range Status   SARS Coronavirus 2 by RT PCR NEGATIVE NEGATIVE Final    Comment: (NOTE) SARS-CoV-2 target nucleic acids are NOT DETECTED. The SARS-CoV-2 RNA is generally detectable in upper respiratoy specimens during the acute phase of infection. The lowest concentration of SARS-CoV-2 viral copies this assay can detect is 131 copies/mL. A negative result does not preclude SARS-Cov-2 infection and should not be used as the sole basis for treatment or other patient management decisions. A negative result  may occur with  improper specimen collection/handling, submission of specimen other than nasopharyngeal swab, presence of viral mutation(s) within the areas targeted by this assay, and inadequate number of viral copies (<131 copies/mL). A negative result must be combined with clinical observations, patient history, and epidemiological information. The expected result is Negative. Fact Sheet for Patients:  PinkCheek.be Fact Sheet for Healthcare Providers:  GravelBags.it This test is not yet ap proved or cleared by the [place] FDA and  has been authorized for detection and/or diagnosis of SARS-CoV-2 by FDA under an Emergency Use Authorization (EUA). This EUA will remain  in effect (meaning this test can be used) for the duration of the COVID-19 declaration under Section 564(b)(1) of the Act, 21 U.S.C. section 360bbb-3(b)(1), unless the authorization is terminated or revoked sooner.    Influenza A by PCR NEGATIVE NEGATIVE Final   Influenza B by PCR NEGATIVE NEGATIVE Final    Comment: (NOTE) The Xpert Xpress SARS-CoV-2/FLU/RSV assay is intended as an aid in  the diagnosis of influenza from Nasopharyngeal swab specimens and  should not be used as a sole basis for treatment. Nasal washings and  aspirates are unacceptable for Xpert Xpress SARS-CoV-2/FLU/RSV  testing. Fact Sheet for Patients: PinkCheek.be Fact Sheet for Healthcare Providers: GravelBags.it This test is not yet approved or cleared by the [place] FDA and  has been authorized for detection and/or diagnosis of SARS-CoV-2 by  FDA under an Emergency Use Authorization (EUA). This EUA will remain  in effect (meaning this test can be used) for the duration of the  Covid-19 declaration under Section 564(b)(1) of the Act, 21  U.S.C. section 360bbb-3(b)(1), unless the authorization is  terminated or  revoked. Performed at [facility], [place] [address] [place], [place] [address]   Blood culture (routine x 2)     Status: None (Preliminary result)   Collection Time: 01/07/20  1:30 PM   Specimen: BLOOD  Result Value Ref Range Status   Specimen Description   Final    BLOOD BLOOD LEFT FOREARM Performed at [place] [address], [place], [place] [address]    Special Requests   Final  BOTTLES DRAWN AEROBIC ONLY Blood Culture results may not be optimal due to an inadequate volume of blood received in culture bottles Performed at [facility], [place] [address] [place], [place] [address]    Culture   Final    NO GROWTH 2 DAYS Performed at [place] [address], [place], [place] [address]    Report Status PENDING  Incomplete         Radiology Studies: CT Head Wo Contrast  Addendum Date: 01/07/2020   ADDENDUM REPORT: 01/07/2020 16:56 ADDENDUM: Critical Value/emergent results were called by telephone at the time of interpretation on 01/07/2020 at 4:50 pm to Dr. Marda Stalker , who verbally acknowledged these results. Electronically Signed   By: Inez Catalina M.D.   On: 01/07/2020 16:56   Result Date: 01/07/2020 CLINICAL DATA:  Visual difficulties with dizziness, history of metastatic melanoma EXAM: CT HEAD WITHOUT CONTRAST TECHNIQUE: Contiguous axial images were obtained from the base of the skull through the vertex without intravenous contrast. COMPARISON:  05/03/2019 FINDINGS: Brain: No findings to suggest acute hemorrhage are identified. In the deep white matter on the left as well as within the subcortical area of the right temporal lobe region, there are ovoid appearing hyperdensities identified which were not seen on the prior exam. Some minimal surrounding edema is noted in the lesion on the left. No other definitive hyperdensities are seen. Vascular: No hyperdense vessel or unexpected calcification. Skull:  Normal. Negative for fracture or focal lesion. Sinuses/Orbits: No acute finding. Other: None. IMPRESSION: New ovoid areas of increased attenuation deep white matter in the left parietal region and subcortical area of the right temporal lobe suspicious for metastatic lesions. MRI of the brain with and without contrast is recommended when clinically able. Given the patient's pacing device, this may need coordination and scheduling with Cardiology if possible. Electronically Signed: By: Inez Catalina M.D. On: 01/07/2020 16:53   DG Chest Portable 1 View  Result Date: 01/07/2020 CLINICAL DATA:  History of metastatic melanoma with generalized weakness for 1 week. EXAM: PORTABLE CHEST 1 VIEW COMPARISON:  09/26/2019 FINDINGS: The cardiac silhouette, mediastinal and hilar contours are within normal limits and stable. The pacer wires/AICD are unchanged. No complicating features. The lungs are clear. No infiltrates, edema or effusions. No pneumothorax. The bony thorax is intact. IMPRESSION: No acute cardiopulmonary findings. Electronically Signed   By: Marijo Sanes M.D.   On: 01/07/2020 14:12        Scheduled Meds: . dexamethasone  4 mg Oral Q6H  . sodium chloride flush  3 mL Intravenous Q12H   Continuous Infusions: . sodium chloride       LOS: 2 days     Georgette Shell, MD [facility]  If 7PM-7AM, please contact night-coverage [email or web address] Password [email or web address] 01/09/2020, 1:50 PM

## 2020-01-09 NOTE — Plan of Care (Signed)

## 2020-01-09 NOTE — TOC Progression Note (Signed)
Transition of Care [facility]) - Progression Note    Patient Details  Name: Lee Hall MRN: [id number] Date of Birth: 04-May-1951  Transition of Care [facility]) CM/SW Contact  Purcell Mouton, RN Phone Number: 01/09/2020, 11:04 AM  Clinical Narrative:    Pt agreed to going to SNF. Pt FL2 completed and PSARR. Will fax to SNF.         Expected Discharge Plan and Services                                                 Social Determinants of Health (SDOH) Interventions    Readmission Risk Interventions Readmission Risk Prevention Plan 02/14/2019  Transportation Screening Complete  PCP or Specialist Appt within 3-5 Days Complete  HRI or Home Care Consult Complete  Palliative Care Screening Not Applicable  Medication Review (RN Care Manager) Referral to Pharmacy  Some recent data might be hidden

## 2020-01-09 NOTE — Progress Notes (Signed)
Daily Progress Note   Patient Name: Lee Hall       Date: 01/09/2020 DOB: October 12, 1951  Age: 69 y.o. MRN#: [id number] Attending Physician: Georgette Shell, MD Primary Care Physician: [name], MD Admit Date: 01/07/2020  Reason for Consultation/Follow-up: Disposition and Establishing goals of care  Subjective: I called and discussed care plan with patient's sister, Pam, regarding Lee Hall and his desire to transition home with hospice services.  Pam reports that he lives alone and she does not see him transferring home to be a realistic plan, even with the assistance of hospice.  She reports being happy to assist her brother, but she is not in a position where she is able to move in and become his 24 hour caregiver.  Pam believes that her brother was under the impression that someone from hospice would move in to be his 24 hour caregiver.  She understands that this is not how home hospice actually functions.  Prior to admission, he was bedbound and unable to stand or get to the bathroom on his own.  She reports talking with him yesterday that he would need to be able to do this independently if he wants to return to his prior home.  I met today with Lee Hall to discuss options for care moving forward.  He is awake and alert, but maybe a touch more confused than yesterday.  He is able to follow and participate in conversation, but does repeat himself at times.  He understands concern regarding plan to transition home and reports talking with his sister about trial of rehab.  He states today that his goal is to get more strength to be able to stand, transfer, and take a few steps on his own as this may allow him to reach a point where he can return home with  assistance.  Length of Stay: 2  Current Medications: Scheduled Meds:  . dexamethasone  4 mg Oral Q6H  . sodium chloride flush  3 mL Intravenous Q12H    Continuous Infusions: . sodium chloride      PRN Meds: sodium chloride, acetaminophen **OR** acetaminophen, antiseptic oral rinse, glycopyrrolate **OR** glycopyrrolate **OR** glycopyrrolate, haloperidol **OR** haloperidol **OR** haloperidol lactate, HYDROmorphone (DILAUDID) injection, LORazepam **OR** LORazepam **OR** LORazepam, LORazepam, morphine CONCENTRATE, ondansetron **OR** ondansetron (ZOFRAN) IV, polyvinyl alcohol, sodium  chloride flush  Physical Exam         General: Alert, awake, in no acute distress.  HEENT: No bruits, no goiter, no JVD Heart: Regular rate and rhythm.  Lungs: Good air movement, clear Abdomen: Soft, nontender, nondistended, positive bowel sounds.  Ext: No significant edema Skin: Warm and dry  Vital Signs: BP (!) 59/35 (BP Location: Right Arm)   Pulse (!) 41   Temp 97.6 F (36.4 C)   Resp 20   Ht 6' (1.829 m)   Wt 67.3 kg   SpO2 99%   BMI 20.12 kg/m  SpO2: SpO2: 99 % O2 Device: O2 Device: Room Air O2 Flow Rate:    Intake/output summary:   Intake/Output Summary (Last 24 hours) at 01/09/2020 1008 Last data filed at 01/09/2020 0600 Gross per 24 hour  Intake 123 ml  Output 975 ml  Net -852 ml   LBM: Last BM Date: 01/06/20 Baseline Weight: Weight: 66.7 kg Most recent weight: Weight: 67.3 kg       Palliative Assessment/Data:    Flowsheet Rows     Most Recent Value  Intake Tab  Referral Department  Hospitalist  Unit at Time of Referral  Med/Surg Unit  Palliative Care Primary Diagnosis  Cancer  Date Notified  01/07/20  Palliative Care Type  New Palliative care  Reason for referral  Clarify Goals of Care, Counsel Regarding Hospice, End of [place]  Date of Admission  01/07/20  Date first seen by Palliative Care  01/08/20  # of days Palliative referral response time  1  Day(s)  # of days IP prior to Palliative referral  0  Clinical Assessment  Palliative Performance Scale Score  30%  Psychosocial & Spiritual Assessment  Palliative Care Outcomes  Patient/Family meeting held?  Yes  Who was at the meeting?  Patient      Patient Active Problem List   Diagnosis Date Noted  . Hypotension 01/07/2020  . Hyperthyroidism 11/15/2019  . Aortic ectasia, abdominal ([place]) 05/11/2019  . Upper airway cough syndrome 04/13/2019  . Diabetes mellitus type II, controlled ([place]) 02/23/2019  . ARF (acute renal failure) ([place]) 02/23/2019  . DOE (dyspnea on exertion) 02/22/2019  . Pneumonia of right lower lobe due to infectious organism 02/22/2019  . Lower extremity edema 02/22/2019  . Healthcare maintenance 02/22/2019  . Coronary artery disease involving native coronary artery of native heart without angina pectoris   . S/P placement of cardiac pacemaker   . Atrial fibrillation ([place])   . History of sustained ventricular tachycardia   . Acute hypoxemic respiratory failure ([place])   . Community acquired pneumonia of right lung   . Severe sepsis ([place])   . Septic shock ([place]) 02/01/2019  . Malignant melanoma metastatic to lymph node ([place]) 10/05/2018  . Tremor of right hand 08/07/2018  . Melanoma of skin ([place]) 08/07/2018  . Abnormal positron emission tomography (PET) scan 08/07/2018  . Nonischemic cardiomyopathy ([place]) 03/19/2009  . Ventricular tachycardia ([place]) 03/19/2009  . CAD, NATIVE VESSEL 02/18/2009  . LEFT VENTRICULAR MURAL THROMBUS 02/18/2009  . Chronic systolic heart failure ([place]) 01/02/2009  . TACHYCARDIA 01/02/2009  . Implantable cardioverter-defibrillator (ICD) in situ 01/02/2009    Palliative Care Assessment & Plan   Patient Profile: 69 y.o. male  with past medical history of metastatic melanoma admitted on 01/07/2020 with decreased functional status and hypotension with concern for sepsis.  Found to have likely brain mets and started on steroids.  He is  documented multiple times to multiple providers  that he is DNR and wants to discuss goals of care.  Palliative consulted for [place].   Recommendations/Plan: - Yesterday, Lee Hall stated goal was only for comfort and to be able to return to his home with support of hospice.  However, he does not have someone who can be present to assist him 24 hours per day.  After discussion with his sister, he reports that he wants to go for rehab to see if he can regain enough functional status to be able to return home.  I discussed with him regarding this, and he would also like to restart steroids today.  Discussed with Dr. Rodena Piety who will review his chronic medications as these were stopped per his request at time of transition to comfort. - Extensive conversations coordinating care plan as discussed with patient, his sister via phone, bedside RN, case manager, hospice liaison, and Dr. Rodena Piety.  Code Status:    Code Status Orders  (From admission, onward)         Start     Ordered   01/08/20 1120  Do not attempt resuscitation (DNR)  Continuous    Question Answer Comment  In the event of cardiac or respiratory ARREST Do not call a "code blue"   In the event of cardiac or respiratory ARREST Do not perform Intubation, CPR, defibrillation or ACLS   In the event of cardiac or respiratory ARREST Use medication by any route, position, wound care, and other measures to relive pain and suffering. May use oxygen, suction and manual treatment of airway obstruction as needed for comfort.      01/08/20 1120        Code Status History    Date Active Date Inactive Code Status Order ID Comments User Context   01/08/2020 0005 01/08/2020 1120 DNR [id number]  Lang Snow, NP Inpatient   01/07/2020 2148 01/08/2020 0004 Full Code [id number]  Cristescu, Linard Millers, MD Inpatient   01/07/2020 1724 01/07/2020 2148 DNR [id number]  Tegeler, Gwenyth Allegra, MD ED   02/01/2019 0909 02/14/2019 1801 Full Code [id number]  Marijean Heath, NP ED   10/05/2018 1422 10/06/2018 1411 Full Code [id number]  Stark Klein, MD Inpatient   Advance Care Planning Activity    Advance Directive Documentation     Most Recent Value  Type of Advance Directive  Healthcare Power of Attorney, Living will  Pre-existing out of facility DNR order (yellow form or pink MOST form)  --  "MOST" Form in Place?  --       Discharge Planning:  [place] for rehab with Palliative care service follow-up  Thank you for allowing the Palliative Medicine Team to assist in the care of this patient.   Time In: 0845 Time Out: 1000 Total Time 75 Prolonged Time Billed Yes      Greater than 50%  of this time was spent counseling and coordinating care related to the above assessment and plan.  Micheline Rough, MD  Please contact Palliative Medicine Team phone at [phone] for questions and concerns.

## 2020-01-09 NOTE — Progress Notes (Signed)
Manufacturing engineer [facility]) Hospital Liaison Note:  Spoke to Dr. Domingo Cocking who has been in contact with the family about current patient status. Family has concerns about going home with hospice at this time, so it has been decided to hold off on hospice services at this time.   Please let us know if [facility] can provide support during this hospitalization and feel free to call with any hospice related questions as needed.  Thank you,  Gar Ponto, RN [facility] (in [place]) [phone]

## 2020-01-09 NOTE — Telephone Encounter (Signed)
Noted  

## 2020-01-10 ENCOUNTER — Other Ambulatory Visit: Payer: Medicare HMO

## 2020-01-10 ENCOUNTER — Ambulatory Visit: Payer: Medicare HMO | Admitting: Oncology

## 2020-01-10 NOTE — Progress Notes (Signed)
Daily Progress Note   Patient Name: Lee Hall       Date: 01/10/2020 DOB: [birth year]  Age: 69 y.o. MRN#: [id number] Attending Physician: Georgette Shell, MD Primary Care Physician: [name], MD Admit Date: 01/07/2020  Reason for Consultation/Follow-up: Disposition and Establishing goals of care  Subjective: Discussed case with Dr. Rodena Piety and Mr. [name] has told her that he is not interested in going to facility for rehab, but rather he is now wanting to transition home with home health rather than hospice (to be able to continue with home based PT/OT)  I met today with Mr. [name] to discuss options for care moving forward.  He is awake, alert, and able to follow and participate in conversation.  He reports that he has been considering, and he is now wanting to transition home on discharge with plan to continue with home based PT to try to regain strength.  He states that he understands my concern regarding a plan to transition home, but states that his home is where he wants to be and he is not wanting to go to SNF, particularly in light of COVID pandemic. Reports that he was able to get up and move around the room earlier today.  Length of Stay: 3  Current Medications: Scheduled Meds:  . canagliflozin  100 mg Oral QAC breakfast  . dexamethasone  4 mg Oral Q6H  . methimazole  10 mg Oral Daily  . sodium chloride flush  3 mL Intravenous Q12H    Continuous Infusions: . sodium chloride      PRN Meds: sodium chloride, acetaminophen **OR** acetaminophen, antiseptic oral rinse, glycopyrrolate **OR** glycopyrrolate **OR** glycopyrrolate, haloperidol **OR** haloperidol **OR** haloperidol lactate, HYDROmorphone (DILAUDID) injection, LORazepam **OR** LORazepam  **OR** LORazepam, LORazepam, morphine CONCENTRATE, ondansetron **OR** ondansetron (ZOFRAN) IV, polyvinyl alcohol, sodium chloride flush  Physical Exam         General: Alert, awake, in no acute distress.  HEENT: No bruits, no goiter, no JVD Heart: Regular rate and rhythm.  Lungs: Good air movement, clear Abdomen: Soft, nontender, nondistended, positive bowel sounds.  Ext: No significant edema Skin: Warm and dry  Vital Signs: BP (!) 74/47 (BP Location: Right Arm)   Pulse (!) 59   Temp 97.6 F (36.4 C) (Oral)   Resp 16   Ht 6' (1.829  m)   Wt 67.3 kg   SpO2 99%   BMI 20.12 kg/m  SpO2: SpO2: 99 % O2 Device: O2 Device: Room Air O2 Flow Rate:    Intake/output summary:   Intake/Output Summary (Last 24 hours) at 01/10/2020 1229 Last data filed at 01/10/2020 1027 Gross per 24 hour  Intake 1200 ml  Output 2240 ml  Net -1040 ml   LBM: Last BM Date: 01/06/20 Baseline Weight: Weight: 66.7 kg Most recent weight: Weight: 67.3 kg       Palliative Assessment/Data:    Flowsheet Rows     Most Recent Value  Intake Tab  Referral Department  Hospitalist  Unit at Time of Referral  Med/Surg Unit  Palliative Care Primary Diagnosis  Cancer  Date Notified  01/07/20  Palliative Care Type  New Palliative care  Reason for referral  Clarify Goals of Care, Counsel Regarding Hospice, End of [place]  Date of Admission  01/07/20  Date first seen by Palliative Care  01/08/20  # of days Palliative referral response time  1 Day(s)  # of days IP prior to Palliative referral  0  Clinical Assessment  Palliative Performance Scale Score  30%  Psychosocial & Spiritual Assessment  Palliative Care Outcomes  Patient/Family meeting held?  Yes  Who was at the meeting?  Patient      Patient Active Problem List   Diagnosis Date Noted  . Hypotension 01/07/2020  . Hyperthyroidism 11/15/2019  . Aortic ectasia, abdominal ([place]) 05/11/2019  . Upper airway cough syndrome 04/13/2019  .  Diabetes mellitus type II, controlled ([place]) 02/23/2019  . ARF (acute renal failure) ([place]) 02/23/2019  . DOE (dyspnea on exertion) 02/22/2019  . Pneumonia of right lower lobe due to infectious organism 02/22/2019  . Lower extremity edema 02/22/2019  . Healthcare maintenance 02/22/2019  . Coronary artery disease involving native coronary artery of native heart without angina pectoris   . S/P placement of cardiac pacemaker   . Atrial fibrillation ([place])   . History of sustained ventricular tachycardia   . Acute hypoxemic respiratory failure ([place])   . Community acquired pneumonia of right lung   . Severe sepsis ([place])   . Septic shock ([place]) 02/01/2019  . Malignant melanoma metastatic to lymph node ([place]) 10/05/2018  . Tremor of right hand 08/07/2018  . Melanoma of skin ([place]) 08/07/2018  . Abnormal positron emission tomography (PET) scan 08/07/2018  . Nonischemic cardiomyopathy ([place]) 03/19/2009  . Ventricular tachycardia ([place]) 03/19/2009  . CAD, NATIVE VESSEL 02/18/2009  . LEFT VENTRICULAR MURAL THROMBUS 02/18/2009  . Chronic systolic heart failure ([place]) 01/02/2009  . TACHYCARDIA 01/02/2009  . Implantable cardioverter-defibrillator (ICD) in situ 01/02/2009    Palliative Care Assessment & Plan   Patient Profile: 69 y.o. male  with past medical history of metastatic melanoma admitted on 01/07/2020 with decreased functional status and hypotension with concern for sepsis.  Found to have likely brain mets and started on steroids.  He is documented multiple times to multiple providers that he is DNR and wants to discuss goals of care.  Palliative consulted for [place].   Recommendations/Plan: - Mr. [name]'s stated goals continue to evolve.  He is now stating that he is planning to discharge home when he leaves the hospital despite concern about his ability to care for himself.  He is now wanting home based PT/OT rather than consideration for skilled facility for rehab or transition home with hospice.   Discussed with his sister as she was coming to visit and  she again was clear that she is not going to be able to stay with him around the clock and is concerned about the safety of him being home alone. - Extensive communications coordinating care with patient, his sister, case Freight forwarder, PT, OT and Dr. Rodena Piety. - Appreciate PT/OT input on his functional status.  Code Status:    Code Status Orders  (From admission, onward)         Start     Ordered   01/08/20 1120  Do not attempt resuscitation (DNR)  Continuous    Question Answer Comment  In the event of cardiac or respiratory ARREST Do not call a "code blue"   In the event of cardiac or respiratory ARREST Do not perform Intubation, CPR, defibrillation or ACLS   In the event of cardiac or respiratory ARREST Use medication by any route, position, wound care, and other measures to relive pain and suffering. May use oxygen, suction and manual treatment of airway obstruction as needed for comfort.      01/08/20 1120        Code Status History    Date Active Date Inactive Code Status Order ID Comments User Context   01/08/2020 0005 01/08/2020 1120 DNR [id number]  Lang Snow, NP Inpatient   01/07/2020 2148 01/08/2020 0004 Full Code [id number]  Cristescu, Linard Millers, MD Inpatient   01/07/2020 1724 01/07/2020 2148 DNR [id number]  Tegeler, Gwenyth Allegra, MD ED   02/01/2019 0909 02/14/2019 1801 Full Code [id number]  Marijean Heath, NP ED   10/05/2018 1422 10/06/2018 1411 Full Code [id number]  Stark Klein, MD Inpatient   Advance Care Planning Activity    Advance Directive Documentation     Most Recent Value  Type of Advance Directive  Healthcare Power of Attorney, Living will  Pre-existing out of facility DNR order (yellow form or pink MOST form)  -  "MOST" Form in Place?  -       Discharge Planning:  [place] for rehab with Palliative care service follow-up  Thank you for allowing the Palliative Medicine Team to assist  in the care of this patient.   Time In: 1150 Time Out: 1230 Total Time 40 Prolonged Time Billed No      Greater than 50%  of this time was spent counseling and coordinating care related to the above assessment and plan.  Micheline Rough, MD  Please contact Palliative Medicine Team phone at [phone] for questions and concerns.

## 2020-01-10 NOTE — Progress Notes (Signed)
PROGRESS NOTE    Lee Hall  FSF:[id number] DOB: February 01, 1951 DOA: 01/07/2020 PCP: [name], MD Brief Narrative:68 y.o.malewith medical history significantforchronic systolic CHF, non ischemiccardiomyopathy with EF of 20-25%. Advanced melanomawith documented metastatic disease with peritoneal implants and adrenal involvement since December 2020. He was started on BRAF targeted therapy and tolerated poorly. Presentedwith a chief complaint of lightheadedness,weakness,andurinary incontinence.On presentation severely hypotensive requiring IV fluid boluses, abnormal labs. CT head concerning for possible metastatic lesions. PCCM was consulted. Patient is alert and oriented x 4 and made decision for comfort measures only.   Assessment & Plan:   Active Problems:   Nonischemic cardiomyopathy (HCC)   Chronic systolic heart failure (HCC)   Implantable cardioverter-defibrillator (ICD) in situ   Melanoma of skin (HCC)   Malignant melanoma metastatic to lymph node (HCC)   Diabetes mellitus type II, controlled ([place])   Hypotension  #1 severe hypotension-blood pressure earlier yesterday was 59/35 and it is up to 113/99 today.  The cause of hypotension is unclear.  No evidence of sepsis.  Possibly cardiogenic shock or adrenal insufficiency in the setting of recurrent advanced melanoma with metastatic with mets to adrenal glands.  Patient tells me today that he lived alone for the last 1-1/2 to 2 years the same way as he is now.  He is awake alert oriented.  He wants to go home with home health.  He does not want to go to rehab.  Await PT and OT evaluation. PT OT consults placed.  #2 nonischemic cardiomyopathy ejection fraction 20 to 25%.  #3 type 2 diabetes-continue to hold [employer] continue SSI.  #4 hypothyroidism on methimazole restarted.      Estimated body mass index is 20.12 kg/m as calculated from the following:   Height as of this encounter: 6'  (1.829 m).   Weight as of this encounter: 67.3 kg.  DVT prophylaxis: SCD  code Status: DNR Family Communication: None Disposition Plan: Patient came from home and is anxious to go home with home health PT and OT.  He does not want to go to rehab.  He is not a hospice candidate yet.  Barriers to discharge PT OT evaluation pending.    Consultants:   pccm palliative  Procedures:none Antimicrobialsnone  Subjective: Patient resting in bed awake alert very firm on his decision that he wants to go home he told me he walked with a walker in the room yesterday.  He is waiting to work with therapy today.  Objective: Vitals:   01/09/20 2204 01/10/20 0535 01/10/20 1315 01/10/20 1412  BP: (!) 70/45 (!) 74/47 (!) 81/54 (!) 113/99  Pulse: 60 (!) 59 60   Resp: 16 16    Temp: 97.6 F (36.4 C)  98.4 F (36.9 C)   TempSrc: Oral  Oral   SpO2: 99% 99% 99%   Weight:      Height:        Intake/Output Summary (Last 24 hours) at 01/10/2020 1446 Last data filed at 01/10/2020 1408 Gross per 24 hour  Intake 1200 ml  Output 2490 ml  Net -1290 ml   Filed Weights   01/07/20 1148 01/07/20 2104  Weight: 66.7 kg 67.3 kg    Examination:  General exam: Appears calm and comfortable  Respiratory system: Clear to auscultation. Respiratory effort normal. Cardiovascular system: S1 & S2 heard, RRR. No JVD, murmurs, rubs, gallops or clicks. No pedal edema. Gastrointestinal system: Abdomen is nondistended, soft and nontender. No organomegaly or masses felt. Normal bowel sounds heard. Central  nervous system: Alert and oriented. No focal neurological deficits. Extremities: Symmetric 5 x 5 power. Skin: No rashes, lesions or ulcers Psychiatry: Judgement and insight appear normal. Mood & affect appropriate.     Data Reviewed: I have personally reviewed following labs and imaging studies  CBC: Recent Labs  Lab 01/04/20 1206 01/07/20 1245 01/08/20 0326  WBC 3.7* 5.0 4.2  NEUTROABS 2.9 4.0  --     HGB 12.0* 10.2* 8.4*  HCT 35.6* 30.6* 26.0*  MCV 86.4 87.4 90.9  PLT 95* 137* 945*   Basic Metabolic Panel: Recent Labs  Lab 01/04/20 1206 01/07/20 1245 01/08/20 0326  NA 130* 129* 133*  K 3.5 3.2* 3.8  CL 99 102 111  CO2 19* 18* 16*  GLUCOSE 270* 153* 198*  BUN 33* 27* 23  CREATININE 1.32* 1.29* 1.29*  CALCIUM 8.1* 8.2* 7.5*  MG  --  2.4  --    GFR: Estimated Creatinine Clearance: 53.6 mL/min (A) (by C-G formula based on SCr of 1.29 mg/dL (H)). Liver Function Tests: Recent Labs  Lab 01/04/20 1206 01/07/20 1245  AST 216* 59*  ALT 506* 193*  ALKPHOS 205* 153*  BILITOT 0.9 0.8  PROT 5.7* 5.4*  ALBUMIN 2.8* 2.6*   Recent Labs  Lab 01/07/20 1245  LIPASE 285*   Recent Labs  Lab 01/07/20 1246  AMMONIA 14   Coagulation Profile: Recent Labs  Lab 01/07/20 1245  INR 1.0   Cardiac Enzymes: No results for input(s): CKTOTAL, CKMB, CKMBINDEX, TROPONINI in the last 168 hours. BNP (last 3 results) Recent Labs    01/31/19 1628 02/22/19 1006  PROBNP 1,698.0* 2,169*   HbA1C: Recent Labs    01/07/20 2132  HGBA1C 8.1*   CBG: Recent Labs  Lab 01/07/20 2336 01/08/20 0754  GLUCAP 217* 147*   Lipid Profile: No results for input(s): CHOL, HDL, LDLCALC, TRIG, CHOLHDL, LDLDIRECT in the last 72 hours. Thyroid Function Tests: No results for input(s): TSH, T4TOTAL, FREET4, T3FREE, THYROIDAB in the last 72 hours. Anemia Panel: No results for input(s): VITAMINB12, FOLATE, FERRITIN, TIBC, IRON, RETICCTPCT in the last 72 hours. Sepsis Labs: Recent Labs  Lab 01/07/20 1245  LATICACIDVEN 1.3    Recent Results (from the past 240 hour(s))  Urine Culture     Status: None   Collection Time: 01/04/20 12:30 PM   Specimen: Urine, Clean Catch  Result Value Ref Range Status   Specimen Description   Final    URINE, CLEAN CATCH Performed at [facility], [place] [address], [place], [place] [address]    Special Requests   Final    NONE Performed  at [facility], [place] [address], [place], [place] [address]    Culture   Final    NO GROWTH Performed at [facility], [place] [address], [place], [place] [address]    Report Status 01/05/2020 FINAL  Final  Urine culture     Status: None   Collection Time: 01/07/20 12:45 PM   Specimen: Urine, Clean Catch  Result Value Ref Range Status   Specimen Description   Final    URINE, CLEAN CATCH Performed at [facility], [place] [address], [place], [place] [address]    Special Requests   Final    NONE Performed at [facility], [place] [address], [place], [place] [address]    Culture   Final    NO GROWTH Performed at [facility], [place] [address] [place], [place]  [address]    Report Status 01/08/2020 FINAL  Final  Blood culture (routine x 2)     Status: None (Preliminary result)   Collection Time: 01/07/20 12:45 PM   Specimen: BLOOD  Result Value Ref Range Status   Specimen Description   Final    BLOOD LEFT ANTECUBITAL Performed at [place] [address], [place], [place] [address]    Special Requests   Final    BOTTLES DRAWN AEROBIC AND ANAEROBIC Blood Culture adequate volume Performed at [place] [address], [place], [place] [address]    Culture   Final    NO GROWTH 3 DAYS Performed at [facility], [place] [address] [place], [place] [address]    Report Status PENDING  Incomplete  Respiratory Panel by RT PCR (Flu A&B, Covid) - Nasopharyngeal Swab     Status: None   Collection Time: 01/07/20 12:46 PM   Specimen: Nasopharyngeal Swab  Result Value Ref Range Status   SARS Coronavirus 2 by RT PCR NEGATIVE NEGATIVE Final    Comment: (NOTE) SARS-CoV-2 target nucleic acids are NOT DETECTED. The SARS-CoV-2 RNA is generally detectable in upper respiratoy specimens during the acute phase of infection. The lowest concentration of SARS-CoV-2 viral copies this assay  can detect is 131 copies/mL. A negative result does not preclude SARS-Cov-2 infection and should not be used as the sole basis for treatment or other patient management decisions. A negative result may occur with  improper specimen collection/handling, submission of specimen other than nasopharyngeal swab, presence of viral mutation(s) within the areas targeted by this assay, and inadequate number of viral copies (<131 copies/mL). A negative result must be combined with clinical observations, patient history, and epidemiological information. The expected result is Negative. Fact Sheet for Patients:  PinkCheek.be Fact Sheet for Healthcare Providers:  GravelBags.it This test is not yet ap proved or cleared by the [place] FDA and  has been authorized for detection and/or diagnosis of SARS-CoV-2 by FDA under an Emergency Use Authorization (EUA). This EUA will remain  in effect (meaning this test can be used) for the duration of the COVID-19 declaration under Section 564(b)(1) of the Act, 21 U.S.C. section 360bbb-3(b)(1), unless the authorization is terminated or revoked sooner.    Influenza A by PCR NEGATIVE NEGATIVE Final   Influenza B by PCR NEGATIVE NEGATIVE Final    Comment: (NOTE) The Xpert Xpress SARS-CoV-2/FLU/RSV assay is intended as an aid in  the diagnosis of influenza from Nasopharyngeal swab specimens and  should not be used as a sole basis for treatment. Nasal washings and  aspirates are unacceptable for Xpert Xpress SARS-CoV-2/FLU/RSV  testing. Fact Sheet for Patients: PinkCheek.be Fact Sheet for Healthcare Providers: GravelBags.it This test is not yet approved or cleared by the [place] FDA and  has been authorized for detection and/or diagnosis of SARS-CoV-2 by  FDA under an Emergency Use Authorization (EUA). This EUA will remain  in effect  (meaning this test can be used) for the duration of the  Covid-19 declaration under Section 564(b)(1) of the Act, 21  U.S.C. section 360bbb-3(b)(1), unless the authorization is  terminated or revoked. Performed at [facility], [place] [address], [place], [place] [address]   Blood culture (routine x 2)     Status: None (Preliminary result)   Collection Time: 01/07/20  1:30 PM   Specimen: BLOOD  Result Value Ref Range Status   Specimen Description   Final    BLOOD BLOOD  LEFT FOREARM Performed at [place] [address] [place], [place] [address]    Special Requests   Final    BOTTLES DRAWN AEROBIC ONLY Blood Culture results may not be optimal due to an inadequate volume of blood received in culture bottles Performed at [place] [address] [place], [place] [address]    Culture   Final    NO GROWTH 3 DAYS Performed at [facility], [place] [address], [place], [place] [address]    Report Status PENDING  Incomplete         Radiology Studies: No results found.      Scheduled Meds: . dexamethasone  4 mg Oral Q6H  . methimazole  10 mg Oral Daily  . sodium chloride flush  3 mL Intravenous Q12H   Continuous Infusions: . sodium chloride       LOS: 3 days     Georgette Shell, MD  Password [facility] [place] 01/10/2020, 2:46 PM

## 2020-01-10 NOTE — Plan of Care (Signed)

## 2020-01-10 NOTE — Progress Notes (Signed)
PT Cancellation Note  Patient Details Name: Naithen Rivenburg MRN: [id number] DOB: 07-04-1951   Cancelled Treatment:    Reason Eval/Treat Not Completed: Other (comment)(pt is comfort care, to DC today with Hospice. Will sign off.)   Philomena Doheny PT 01/10/2020  Acute Rehabilitation Services Pager [phone] Office [phone]

## 2020-01-10 NOTE — Care Management Important Message (Signed)
Important Message  Patient Details IM Letter given to Gabriel Earing RN Case Manager to present to the Patient Name: Lee Hall MRN: [id number] Date of Birth: Jun 22, 1951   Medicare Important Message Given:  Yes     Kerin Salen 01/10/2020, 9:57 AM

## 2020-01-10 NOTE — Evaluation (Addendum)
Occupational Therapy Evaluation Patient Details Name: Lee Hall MRN: [id number] DOB: 1951-02-18 Today's Date: 01/10/2020    History of Present Illness 69 y.o. male with medical history significant for chronic systolic CHF, non ischemic cardiomyopathy with EF of 20-25%.  Advanced melanoma with documented metastatic disease with peritoneal implants and adrenal involvement since December 2020.  He was started on BRAF targeted therapy and tolerated poorly.  Presented with a chief complaint of lightheadedness, weakness, and urinary incontinence.  On presentation severely hypotensive requiring IV fluid boluses, abnormal labs.  CT head concerning for possible metastatic lesions.   Clinical Impression   Pt was admitted for the above.  Pt had recent falls and sister has been supporting him through his CA txs. She cannot commit to being there on a daily basis to assist.  Pt can perform ADLs and mobility at min guard level.  My concern is endurance/stamina to do all he needs to do over the course of a day, especially meals (sandwiches).  Pt is adament that he doesn't want to go to a SNF.  He states that he is at the end of his life and wants to be able to go home and die with dignity.   Sister would like him to get stronger, and he just absolutely is not agreeable to going somewhere.   I feel that pt really needs a little more support than what he has.  Asked CM, if he would qualify for any other programs vs home based hospice services and what they would offer.  Pt does get orthostatic, and this improved when he sat for a few minutes.  He states that falls were due to his legs giving out under him.  Will follow in acute setting with mod I level goals                                                                              Follow Up Recommendations  Home health OT;Supervision - Intermittent(vs hospice services) ; [place] aide; any programs to support IADLs I feel that pt should look into some kind of  life alert system if he goes home.    Equipment Recommendations  None recommended by OT(has [facility])    Recommendations for Other Services       Precautions / Restrictions Precautions Precautions: Fall Precaution Comments: several falls just prior to admission. Has orthostatic hypotension when he first sits up and is asymptomatic Restrictions Weight Bearing Restrictions: No      Mobility Bed Mobility Overal bed mobility: Modified Independent             General bed mobility comments: HOB up, used rail  Transfers Overall transfer level: Needs assistance Equipment used: Rolling walker (2 wheeled) Transfers: Sit to/from Stand Sit to Stand: Min guard         General transfer comment: min/guard for safety, pt orthostatic but asymptomatic with supine to sit    Balance Overall balance assessment: Needs assistance   Sitting balance-Leahy Scale: Good       Standing balance-Leahy Scale: Fair Standing balance comment: relies on BUE support for walking  ADL either performed or assessed with clinical judgement   ADL Overall ADL's : Needs assistance/impaired                                       General ADL Comments: Pt can perform adls at a min guard level to set himself up, using RW.  Pt's main concern is toileting; he has not had time to get to commode and started to wear depends.  He might be able to use urinal and empty this after he can take his time adjusting to position changes.     Vision         Perception     Praxis      Pertinent Vitals/Pain Pain Assessment: 0-10 Pain Score: 1  Pain Location: L buttock cheek Pain Descriptors / Indicators: Sore Pain Intervention(s): Limited activity within patient's tolerance;Monitored during session;Repositioned     Hand Dominance     Extremity/Trunk Assessment Upper Extremity Assessment Upper Extremity Assessment: (grossly 4-/5)   Lower Extremity  Assessment Lower Extremity Assessment: Overall WFL for tasks assessed(decreased sensation L lateral foot 2* chemo)   Cervical / Trunk Assessment Cervical / Trunk Assessment: Normal   Communication Communication Communication: No difficulties   Cognition Arousal/Alertness: Awake/alert Behavior During Therapy: WFL for tasks assessed/performed Overall Cognitive Status: Within Functional Limits for tasks assessed                                     General Comments       Exercises     Shoulder Instructions      Home Living Family/patient expects to be discharged to:: Private residence Living Arrangements: Alone Available Help at Discharge: Family;Available PRN/intermittently Type of Home: Apartment Home Access: Stairs to enter Entrance Stairs-Number of Steps: 14 Entrance Stairs-Rails: Right;Left;Can reach both Home Layout: One level     Bathroom Shower/Tub: Corporate investment banker: Standard Bathroom Accessibility: Yes   Home Equipment: Bedside commode;Walker - 2 wheels   Additional Comments: sister has been helping with groceries, cannot commit to visiting pt daily      Prior Functioning/Environment Level of Independence: Independent        Comments: was independent but had recent falls, loss of strength        OT Problem List: Decreased strength;Decreased activity tolerance;Impaired balance (sitting and/or standing);Decreased knowledge of use of DME or AE      OT Treatment/Interventions: Self-care/ADL training;Energy conservation;DME and/or AE instruction;Patient/family education;Balance training;Therapeutic activities    OT Goals(Current goals can be found in the care plan section) Acute Rehab OT Goals Patient Stated Goal: pt states he wants to go home and just not wake up.  He is tired of all his struggles with CA and his heart and is discontinuing tx OT Goal Formulation: With patient Time For Goal Achievement:  01/24/20 Potential to Achieve Goals: Good ADL Goals Pt Will Transfer to Toilet: with modified independence;bedside commode Pt Will Perform Toileting - Clothing Manipulation and hygiene: with modified independence Additional ADL Goal #1: pt will gather clothes and perform ADL at mod I level  OT Frequency: Min 2X/week   Barriers to D/C:            Co-evaluation PT/OT/SLP Co-Evaluation/Treatment: Yes Reason for Co-Treatment: For patient/therapist safety PT goals addressed during session: Mobility/safety with mobility OT goals addressed during session:  ADL's and self-care      AM-PAC OT "6 Clicks" Daily Activity     Outcome Measure Help from another person eating meals?: None Help from another person taking care of personal grooming?: A Little Help from another person toileting, which includes using toliet, bedpan, or urinal?: A Little Help from another person bathing (including washing, rinsing, drying)?: A Little Help from another person to put on and taking off regular upper body clothing?: A Little Help from another person to put on and taking off regular lower body clothing?: A Little 6 Click Score: 19   End of Session    Activity Tolerance: Patient tolerated treatment well Patient left: in chair;with call bell/phone within reach;with chair alarm set  OT Visit Diagnosis: Unsteadiness on feet (R26.81);Muscle weakness (generalized) (M62.81);History of falling (Z91.81)                Time: 7564-3329 OT Time Calculation (min): 55 min Charges:  OT General Charges $OT Visit: 1 Visit OT Evaluation $OT Eval Low Complexity: 1 Low OT Treatments $Self Care/Home Management : 8-22 mins  Devario Bucklew S, OTR/L Acute Rehabilitation Services 01/10/2020  Tilda Samudio 01/10/2020, 3:41 PM

## 2020-01-10 NOTE — Evaluation (Signed)
Physical Therapy Evaluation Patient Details Name: Lee Hall MRN: [id number] DOB: [birth year] Today's Date: 01/10/2020   History of Present Illness  69 y.o. male with medical history significant for chronic systolic CHF, non ischemic cardiomyopathy with EF of 20-25%.  Advanced melanoma with documented metastatic disease with peritoneal implants and adrenal involvement since December 2020.  He was started on BRAF targeted therapy and tolerated poorly.  Presented with a chief complaint of lightheadedness, weakness, and urinary incontinence.  On presentation severely hypotensive requiring IV fluid boluses, abnormal labs.  CT head concerning for possible metastatic lesions.  Clinical Impression  Pt admitted with above diagnosis. Pt ambulated 110' with RW, no loss of balance. He did have orthostatic hypotension with supine to sit (see flowsheets) but reported minimal dizziness. Pt plans to DC home alone, he declines SNF. His sister is his only support available, she is unable to visit daily. He would benefit from max supportive services from [facility] or Hospice.  Pt currently with functional limitations due to the deficits listed below (see PT Problem List). Pt will benefit from skilled PT to increase their independence and safety with mobility to allow discharge to the venue listed below.       Follow Up Recommendations Home health PT(home with HHPT and max HHA services (from [facility] or Hospice))    Equipment Recommendations  None recommended by PT    Recommendations for Other Services       Precautions / Restrictions Precautions Precautions: Fall Precaution Comments: several falls just prior to admission Restrictions Weight Bearing Restrictions: No      Mobility  Bed Mobility Overal bed mobility: Modified Independent             General bed mobility comments: HOB up, used rail  Transfers Overall transfer level: Needs assistance Equipment used: Rolling walker (2  wheeled) Transfers: Sit to/from Stand Sit to Stand: Min guard         General transfer comment: min/guard for safety, pt orthostatic but asymptomatic with supine to sit  Ambulation/Gait Ambulation/Gait assistance: Supervision Gait Distance (Feet): 110 Feet Assistive device: Rolling walker (2 wheeled) Gait Pattern/deviations: Step-through pattern Gait velocity: decr   General Gait Details: steady with RW, no loss of balance  Stairs            Wheelchair Mobility    Modified Rankin (Stroke Patients Only)       Balance Overall balance assessment: Needs assistance   Sitting balance-Leahy Scale: Good       Standing balance-Leahy Scale: Fair Standing balance comment: relies on BUE support for walking                             Pertinent Vitals/Pain Pain Assessment: 0-10 Pain Score: 1  Pain Location: L buttock cheek Pain Descriptors / Indicators: Sore Pain Intervention(s): Limited activity within patient's tolerance;Monitored during session;Repositioned    Home Living Family/patient expects to be discharged to:: Private residence Living Arrangements: Alone Available Help at Discharge: Family;Available PRN/intermittently Type of Home: Apartment Home Access: Stairs to enter Entrance Stairs-Rails: Right;Left;Can reach both Entrance Stairs-Number of Steps: 14 Home Layout: One level Home Equipment: None Additional Comments: sister has been helping with groceries, cannot commit to visiting pt daily    Prior Function Level of Independence: Independent               Hand Dominance        Extremity/Trunk Assessment   Upper Extremity Assessment Upper Extremity  Assessment: Defer to OT evaluation    Lower Extremity Assessment Lower Extremity Assessment: Overall WFL for tasks assessed(decreased sensation L lateral foot 2* chemo)    Cervical / Trunk Assessment Cervical / Trunk Assessment: Normal  Communication   Communication: No  difficulties  Cognition Arousal/Alertness: Awake/alert Behavior During Therapy: WFL for tasks assessed/performed Overall Cognitive Status: Within Functional Limits for tasks assessed                                        General Comments      Exercises     Assessment/Plan    PT Assessment Patient needs continued PT services  PT Problem List Decreased mobility;Decreased activity tolerance;Decreased balance       PT Treatment Interventions Gait training;Stair training;Therapeutic exercise;Therapeutic activities;Patient/family education    PT Goals (Current goals can be found in the Care Plan section)  Acute Rehab PT Goals Patient Stated Goal: hopes to DC home PT Goal Formulation: With patient/family Time For Goal Achievement: 01/24/20 Potential to Achieve Goals: Fair    Frequency Min 3X/week   Barriers to discharge        Co-evaluation PT/OT/SLP Co-Evaluation/Treatment: Yes Reason for Co-Treatment: For patient/therapist safety PT goals addressed during session: Mobility/safety with mobility;Balance;Proper use of DME         AM-PAC PT "6 Clicks" Mobility  Outcome Measure Help needed turning from your back to your side while in a flat bed without using bedrails?: None Help needed moving from lying on your back to sitting on the side of a flat bed without using bedrails?: None Help needed moving to and from a bed to a chair (including a wheelchair)?: A Little Help needed standing up from a chair using your arms (e.g., wheelchair or bedside chair)?: A Little Help needed to walk in hospital room?: A Little Help needed climbing 3-5 steps with a railing? : A Lot 6 Click Score: 19    End of Session Equipment Utilized During Treatment: Gait belt Activity Tolerance: Patient tolerated treatment well Patient left: in chair;with call bell/phone within reach;with chair alarm set Nurse Communication: Mobility status PT Visit Diagnosis: Difficulty in walking,  not elsewhere classified (R26.2);Pain    Time: 1407-1501 PT Time Calculation (min) (ACUTE ONLY): 54 min   Charges:   PT Evaluation $PT Eval Low Complexity: 1 Low PT Treatments $Gait Training: 8-22 mins      Blondell Reveal Kistler PT 01/10/2020  Acute Rehabilitation Services Pager [phone] Office [phone]

## 2020-01-10 NOTE — Progress Notes (Signed)
Attempted to see pt for OT eval.  After reviewing chair and speaking to nurse, noted pt is comfort care only and will be discharging to hospice/snf today.  Will sign off at this time.   Jinger Neighbors, [place] [phone]

## 2020-01-11 ENCOUNTER — Encounter: Payer: Self-pay | Admitting: Family Medicine

## 2020-01-11 MED ORDER — HALOPERIDOL 0.5 MG PO TABS: 0.5000 mg | ORAL_TABLET | ORAL | 1 refills | Status: DC | PRN

## 2020-01-11 MED ORDER — LORAZEPAM 1 MG PO TABS: 1.0000 mg | ORAL_TABLET | ORAL | 0 refills | Status: AC | PRN

## 2020-01-11 MED ORDER — DEXAMETHASONE 4 MG PO TABS: 4.0000 mg | ORAL_TABLET | Freq: Two times a day (BID) | ORAL | 0 refills | Status: AC

## 2020-01-11 MED ORDER — ONDANSETRON HCL 4 MG PO TABS: 4.0000 mg | ORAL_TABLET | Freq: Four times a day (QID) | ORAL | 0 refills | Status: DC | PRN

## 2020-01-11 NOTE — Progress Notes (Signed)
Physical Therapy Treatment Patient Details Name: Lee Hall MRN: [id number] DOB: Apr 26, 1951 Today's Date: 01/11/2020    History of Present Illness 69 y.o. male with medical history significant for chronic systolic CHF, non ischemic cardiomyopathy with EF of 20-25%.  Advanced melanoma with documented metastatic disease with peritoneal implants and adrenal involvement since December 2020.  He was started on BRAF targeted therapy and tolerated poorly.  Presented with a chief complaint of lightheadedness, weakness, and urinary incontinence.  On presentation severely hypotensive requiring IV fluid boluses, abnormal labs.  CT head concerning for possible metastatic lesions.    PT Comments    Pt making good progress and able to perform stairs.  Despite hypotension, pt is asymptomatic and demonstrates safe gait and stairs with supervision.  Pt is aware of his prognosis and wants to be at home -states he feels that he is at baseline and with all of the delivery services available now (groceries and food) he can manage at home. From a PT perspective, at this time with current mobility level pt would be safe to return home with [facility] services from [facility] or hospice pending pt's decision, intermittent supervision, and assist with IADLs as needed.      Follow Up Recommendations  Home health PT([facility] services available , some mention of possible HH hospice in notes)     Equipment Recommendations  None recommended by PT    Recommendations for Other Services       Precautions / Restrictions Precautions Precautions: Fall Precaution Comments: several falls just prior to admission. Has orthostatic hypotension when he first sits up and is asymptomatic    Mobility  Bed Mobility               General bed mobility comments: OOB upon arrival  Transfers Overall transfer level: Needs assistance Equipment used: None Transfers: Sit to/from Stand Sit to Stand: Supervision         General  transfer comment: steady, no dizziness  Ambulation/Gait Ambulation/Gait assistance: Supervision Gait Distance (Feet): 350 Feet Assistive device: Rolling walker (2 wheeled) Gait Pattern/deviations: Step-through pattern   Gait velocity interpretation: 1.31 - 2.62 ft/sec, indicative of limited community ambulator General Gait Details: steady, no LOB, pt was able to carry on a conversation and look up/down/L/R, turn, and stop without LOB   Stairs Stairs: Yes Stairs assistance: Min guard Stair Management: One rail Right;Step to pattern;Alternating pattern Number of Stairs: 12 General stair comments: Pt performed steps safely with min guard.  He performed about 50% with alternating pattern.   Wheelchair Mobility    Modified Rankin (Stroke Patients Only)       Balance   Sitting-balance support: No upper extremity supported;Feet supported Sitting balance-Leahy Scale: Normal     Standing balance support: Bilateral upper extremity supported;During functional activity Standing balance-Leahy Scale: Good                              Cognition Arousal/Alertness: Awake/alert Behavior During Therapy: WFL for tasks assessed/performed Overall Cognitive Status: Within Functional Limits for tasks assessed                                 General Comments: Pt oriented to situation and prognosis.  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  States  he will not go to a SNF and plans to go home with [facility] services.  He reports sister is wanting him to go to a facility, but he does not want to.  States he feels he is at his baseline.  Reports that his strength has improved since he stopped the oral chemo.      Exercises      General Comments General comments (skin integrity, edema, etc.): Pt reports having BSC and RW at home.  Does not have a shower chair - discussed chair vs bench and  that HHPT could help make appropriate decision based on pts bathroom (if there is room for bench).  Sister asked about transport chair -discussed at this time pt walking and able to perform stairs.      Pertinent Vitals/Pain Pain Assessment: No/denies pain    Home Living                      Prior Function            PT Goals (current goals can now be found in the care plan section) Progress towards PT goals: Progressing toward goals    Frequency    Min 3X/week      PT Plan Current plan remains appropriate    Co-evaluation              AM-PAC PT "6 Clicks" Mobility   Outcome Measure  Help needed turning from your back to your side while in a flat bed without using bedrails?: None Help needed moving from lying on your back to sitting on the side of a flat bed without using bedrails?: None Help needed moving to and from a bed to a chair (including a wheelchair)?: None Help needed standing up from a chair using your arms (e.g., wheelchair or bedside chair)?: None Help needed to walk in hospital room?: None Help needed climbing 3-5 steps with a railing? : A Little 6 Click Score: 23    End of Session Equipment Utilized During Treatment: Gait belt Activity Tolerance: Patient tolerated treatment well Patient left: in chair;with call bell/phone within reach;with chair alarm set;with family/visitor present Nurse Communication: Mobility status PT Visit Diagnosis: Difficulty in walking, not elsewhere classified (R26.2);Pain     Time: 3825-0539 PT Time Calculation (min) (ACUTE ONLY): 25 min  Charges:  $Gait Training: 23-37 mins                     Maggie Font, PT Acute Rehab Services Pager [phone] [facility] [phone]    Karlton Lemon 01/11/2020, 11:12 AM

## 2020-01-11 NOTE — Progress Notes (Signed)
Pt being discharged home and following up with home health. VSS. Discharge instructions reviewed. Educated pt on medication list. Voiced understanding. No concerns at this time.

## 2020-01-11 NOTE — Consult Note (Signed)
   [facility] Inpatient Consult   01/11/2020  Zeddie Njie Waldschmidt [birth year] [phone]   Patient screened for potential [facility] Care Management services due to unplanned readmission risk score of 27%, high.  Spoke with Mr. Lehrke by telephone. HIPAA verified. Explained [facility] services. Patient agrees to receive follow up phone call from [place] community RN to assess for any community needs. Patient states that he likes to review information with his sister, Matthias Hughs, and requests that she be included in conversation.  Referral for [facility] CM services placed. Of note, [facility] Care Management services does not replace or interfere with any services that are arranged by inpatient case management or social work.  Netta Cedars, MSN, [facility] Liaison Nurse Mobile Phone [phone]  Toll free office [phone]

## 2020-01-11 NOTE — TOC Progression Note (Addendum)
Transition of Care [facility]) - Progression Note    Patient Details  Name: Lee Hall MRN: [id number] Date of Birth: March 14, 1951  [facility]) CM/SW Contact  Purcell Mouton, RN Phone Number: 01/11/2020, 10:51 AM  Clinical Narrative:    Spoke with pt and sister concerning discharge to home or SNF. Pt selected going home with HH. Sister was concerned about pt going to MD follow up appointments. Encouraged her to use [facility] for transportation. Sister really want pt to go to SNF, pt refused to go to SNF. Pt decided to go home with [facility].         Expected Discharge Plan and Services                                                 Social Determinants of Health (SDOH) Interventions    Readmission Risk Interventions Readmission Risk Prevention Plan 02/14/2019  Transportation Screening Complete  PCP or Specialist Appt within 3-5 Days Complete  HRI or Home Care Consult Complete  Palliative Care Screening Not Applicable  Medication Review (RN Care Manager) Referral to Pharmacy  Some recent data might be hidden

## 2020-01-11 NOTE — TOC Progression Note (Signed)
Transition of Care [facility]) - Progression Note    Patient Details  Name: Lee Hall MRN: [id number] Date of Birth: [birth year]  Transition of Care [facility]) CM/SW Contact  Purcell Mouton, RN Phone Number: 01/11/2020, 1:45 PM  Clinical Narrative:    Lee Hall was called, RN aware.         Expected Discharge Plan and Services           Expected Discharge Date: 01/11/20                                     Social Determinants of Health (SDOH) Interventions    Readmission Risk Interventions Readmission Risk Prevention Plan 02/14/2019  Transportation Screening Complete  PCP or Specialist Appt within 3-5 Days Complete  HRI or Home Care Consult Complete  Palliative Care Screening Not Applicable  Medication Review (RN Care Manager) Referral to Pharmacy  Some recent data might be hidden

## 2020-01-11 NOTE — Discharge Summary (Signed)
Physician Discharge Summary  Lee Hall VCB:[id number] DOB: June 27, 1951 DOA: 01/07/2020  PCP: [name], MD  Admit date: 01/07/2020 Discharge date: 01/11/2020  Admitted From: Home Disposition: Home Recommendations for Outpatient Follow-up:  1. Follow up with PCP in 1-2 weeks 2. Please obtain BMP/CBC in one week 3. Please follow up on the following pending results:  Home Health: Yes  equipment/Devices none Discharge Condition: Stable and improved  CODE STATUS DNR Diet recommendation regular diet Brief/Interim Summary:68 y.o.malewith medical history significantforchronic systolic CHF, non ischemiccardiomyopathy with EF of 20-25%. Advanced melanomawith documented metastatic disease with peritoneal implants and adrenal involvement since December 2020. He was started on BRAF targeted therapy and tolerated poorly. Presentedwith a chief complaint of lightheadedness,weakness,andurinary incontinence.On presentation severely hypotensive requiring IV fluid boluses, abnormal labs. CT head concerning for possible metastatic lesions. PCCM was consulted. Patient is alert and oriented x 4 and made decision for comfort measures only.  Discharge Diagnoses:  Active Problems:   Nonischemic cardiomyopathy (HCC)   Chronic systolic heart failure (HCC)   Implantable cardioverter-defibrillator (ICD) in situ   Melanoma of skin (HCC)   Malignant melanoma metastatic to lymph node (HCC)   Diabetes mellitus type II, controlled ([place])   Hypotension   #1 severe hypotension- The cause of hypotension is unclear. No evidence of sepsis. Possibly cardiogenic shock or adrenal insufficiency in the setting of recurrent advanced melanoma with metastatic with mets to adrenal glands.  Patient tells me today that he lived alone for the last 1-1/2 to 2 years the same way as he is now.  He is awake alert oriented.  He wants to go home with home health.  He does not want to go to rehab. He is  aware he will be home alone most of the time.  His sister will not be able to take care of him or watch him.  He is at risk for falls.  This was explained to the patient.  His blood pressure today is 82/54 with a heart rate of 60.  I have not restarted any of his cardiac medications due to this.  Initial plan was to discharge him to rehab with hospice following or home with hospice.  However patient wants to continue getting therapy so he will be discharged home with home health.  Patient is a DNR.   #2 nonischemic cardiomyopathy ejection fraction 20 to 25%.  No cardiac medications have been restarted.  Patient has a pacemaker  in place.  #3 type 2 diabetes-we have stopped Invokana due to acidosis  #4 hypothyroidism on methimazole restarted.    Estimated body mass index is 20.12 kg/m as calculated from the following:   Height as of this encounter: 6' (1.829 m).   Weight as of this encounter: 67.3 kg.  Discharge Instructions  Discharge Instructions    Diet - low sodium heart healthy   Complete by: As directed    Increase activity slowly   Complete by: As directed      Allergies as of 01/11/2020   No Known Allergies     Medication List    STOP taking these medications   acyclovir 800 MG tablet Commonly known as: ZOVIRAX   amiodarone 200 MG tablet Commonly known as: PACERONE   aspirin 81 MG tablet   carvedilol 3.125 MG tablet Commonly known as: COREG   empagliflozin 10 MG Tabs tablet Commonly known as: Jardiance   Entresto 24-26 MG Generic drug: sacubitril-valsartan   Mekinist 2 MG tablet Generic drug: trametinib dimethyl sulfoxide  mexiletine 150 MG capsule Commonly known as: MEXITIL   rosuvastatin 10 MG tablet Commonly known as: CRESTOR   sulfamethoxazole-trimethoprim 800-160 MG tablet Commonly known as: BACTRIM DS   Tafinlar 75 MG capsule Generic drug: dabrafenib mesylate   tamsulosin 0.4 MG Caps capsule Commonly known as: FLOMAX     TAKE these  medications   blood glucose meter kit and supplies Dispense based on patient and insurance preference. Use up to four times daily as directed. (FOR ICD-10 E10.9, E11.9).   dexamethasone 4 MG tablet Commonly known as: DECADRON Take 1 tablet (4 mg total) by mouth 2 (two) times daily.   haloperidol 0.5 MG tablet Commonly known as: HALDOL Take 1 tablet (0.5 mg total) by mouth every 4 (four) hours as needed for agitation (or delirium).   ipratropium 0.06 % nasal spray Commonly known as: ATROVENT USE 2 SPRAYS INTO BOTH NOSTRILS 3 (THREE) TIMES DAILY. What changed: See the new instructions.   LORazepam 1 MG tablet Commonly known as: ATIVAN Take 1 tablet (1 mg total) by mouth every 4 (four) hours as needed for anxiety.   metFORMIN 500 MG 24 hr tablet Commonly known as: GLUCOPHAGE-XR TAKE 2 TABLETS (1,000 MG TOTAL) BY MOUTH AT BEDTIME. What changed: See the new instructions.   methimazole 10 MG tablet Commonly known as: TAPAZOLE Take 1 tablet (10 mg total) by mouth daily.   ondansetron 4 MG tablet Commonly known as: ZOFRAN Take 1 tablet (4 mg total) by mouth every 6 (six) hours as needed for nausea.      Follow-up Information    [name], MD Follow up.   Specialty: Family Medicine Contact information: [place] [address] [phone]        Evans Lance, MD .   Specialty: Cardiology Contact information: [phone] [address] [phone]          No Known Allergies  Consultations: Palliative care   Procedures/Studies: CT Head Wo Contrast  Addendum Date: 01/07/2020   ADDENDUM REPORT: 01/07/2020 16:56 ADDENDUM: Critical Value/emergent results were called by telephone at the time of interpretation on 01/07/2020 at 4:50 pm to Dr. Marda Stalker , who verbally acknowledged these results. Electronically Signed   By: Inez Catalina M.D.   On: 01/07/2020 16:56   Result Date: 01/07/2020 CLINICAL  DATA:  Visual difficulties with dizziness, history of metastatic melanoma EXAM: CT HEAD WITHOUT CONTRAST TECHNIQUE: Contiguous axial images were obtained from the base of the skull through the vertex without intravenous contrast. COMPARISON:  05/03/2019 FINDINGS: Brain: No findings to suggest acute hemorrhage are identified. In the deep white matter on the left as well as within the subcortical area of the right temporal lobe region, there are ovoid appearing hyperdensities identified which were not seen on the prior exam. Some minimal surrounding edema is noted in the lesion on the left. No other definitive hyperdensities are seen. Vascular: No hyperdense vessel or unexpected calcification. Skull: Normal. Negative for fracture or focal lesion. Sinuses/Orbits: No acute finding. Other: None. IMPRESSION: New ovoid areas of increased attenuation deep white matter in the left parietal region and subcortical area of the right temporal lobe suspicious for metastatic lesions. MRI of the brain with and without contrast is recommended when clinically able. Given the patient's pacing device, this may need coordination and scheduling with Cardiology if possible. Electronically Signed: By: Inez Catalina M.D. On: 01/07/2020 16:53   DG Chest Portable 1 View  Result Date: 01/07/2020 CLINICAL DATA:  History of metastatic melanoma with generalized weakness for 1 week. EXAM: PORTABLE CHEST 1 VIEW COMPARISON:  09/26/2019 FINDINGS: The cardiac silhouette, mediastinal and hilar contours are within normal limits and stable. The pacer wires/AICD are unchanged. No complicating features. The lungs are clear. No infiltrates, edema or effusions. No pneumothorax. The bony thorax is intact. IMPRESSION: No acute cardiopulmonary findings. Electronically Signed   By: Marijo Sanes M.D.   On: 01/07/2020 14:12    (Echo, Carotid, EGD, Colonoscopy, ERCP)    Subjective:  He is sitting up in his chair wanting to go home Discharge  Exam: Vitals:   01/10/20 2126 01/11/20 0613  BP: (!) 84/50 (!) 82/54  Pulse: (!) 59 60  Resp: 18 16  Temp: 97.7 F (36.5 C) 97.7 F (36.5 C)  SpO2: 100% 99%   Vitals:   01/10/20 1315 01/10/20 1412 01/10/20 2126 01/11/20 0613  BP: (!) 81/54 (!) 113/99 (!) 84/50 (!) 82/54  Pulse: 60  (!) 59 60  Resp:   18 16  Temp: 98.4 F (36.9 C)  97.7 F (36.5 C) 97.7 F (36.5 C)  TempSrc: Oral  Oral Oral  SpO2: 99%  100% 99%  Weight:      Height:        General: Pt is alert, awake, not in acute distress Cardiovascular: RRR, S1/S2 +, no rubs, no gallops Respiratory: CTA bilaterally, no wheezing, no rhonchi Abdominal: Soft, NT, ND, bowel sounds + Extremities: no edema, no cyanosis    The results of significant diagnostics from this hospitalization (including imaging, microbiology, ancillary and laboratory) are listed below for reference.     Microbiology: Recent Results (from the past 240 hour(s))  Urine Culture     Status: None   Collection Time: 01/04/20 12:30 PM   Specimen: Urine, Clean Catch  Result Value Ref Range Status   Specimen Description   Final    URINE, CLEAN CATCH Performed at [facility], [address], [place], [place] [address]    Special Requests   Final    NONE Performed at [facility], [place] [address] [place], [place] [address]    Culture   Final    NO GROWTH Performed at [facility], [place] [address], [place], [place] [address]    Report Status 01/05/2020 FINAL  Final  Urine culture     Status: None   Collection Time: 01/07/20 12:45 PM   Specimen: Urine, Clean Catch  Result Value Ref Range Status   Specimen Description   Final    URINE, CLEAN CATCH Performed at [facility], [place] [address], [place], [place] [address]    Special Requests   Final    NONE Performed at [facility], [place] [address], [place], [place] [address]    Culture   Final    NO  GROWTH Performed at [facility], [place] [address] [place], [place] [address]    Report Status 01/08/2020 FINAL  Final  Blood culture (routine x 2)     Status: None (Preliminary result)   Collection Time: 01/07/20 12:45 PM   Specimen: BLOOD  Result Value Ref Range Status   Specimen Description   Final    BLOOD LEFT ANTECUBITAL Performed at [facility] [address] [place], [place] [address]    Special Requests   Final    BOTTLES DRAWN AEROBIC AND ANAEROBIC Blood Culture adequate volume Performed at [facility]  [address], [place], [place] [address]    Culture   Final    NO GROWTH 4 DAYS Performed at [facility], [place] [address], [place], [place] [address]    Report Status PENDING  Incomplete  Respiratory Panel by RT PCR (Flu A&B, Covid) - Nasopharyngeal Swab     Status: None   Collection Time: 01/07/20 12:46 PM   Specimen: Nasopharyngeal Swab  Result Value Ref Range Status   SARS Coronavirus 2 by RT PCR NEGATIVE NEGATIVE Final    Comment: (NOTE) SARS-CoV-2 target nucleic acids are NOT DETECTED. The SARS-CoV-2 RNA is generally detectable in upper respiratoy specimens during the acute phase of infection. The lowest concentration of SARS-CoV-2 viral copies this assay can detect is 131 copies/mL. A negative result does not preclude SARS-Cov-2 infection and should not be used as the sole basis for treatment or other patient management decisions. A negative result may occur with  improper specimen collection/handling, submission of specimen other than nasopharyngeal swab, presence of viral mutation(s) within the areas targeted by this assay, and inadequate number of viral copies (<131 copies/mL). A negative result must be combined with clinical observations, patient history, and epidemiological information. The expected result is Negative. Fact Sheet for Patients:  PinkCheek.be Fact Sheet for  Healthcare Providers:  GravelBags.it This test is not yet ap proved or cleared by the [place] FDA and  has been authorized for detection and/or diagnosis of SARS-CoV-2 by FDA under an Emergency Use Authorization (EUA). This EUA will remain  in effect (meaning this test can be used) for the duration of the COVID-19 declaration under Section 564(b)(1) of the Act, 21 U.S.C. section 360bbb-3(b)(1), unless the authorization is terminated or revoked sooner.    Influenza A by PCR NEGATIVE NEGATIVE Final   Influenza B by PCR NEGATIVE NEGATIVE Final    Comment: (NOTE) The Xpert Xpress SARS-CoV-2/FLU/RSV assay is intended as an aid in  the diagnosis of influenza from Nasopharyngeal swab specimens and  should not be used as a sole basis for treatment. Nasal washings and  aspirates are unacceptable for Xpert Xpress SARS-CoV-2/FLU/RSV  testing. Fact Sheet for Patients: PinkCheek.be Fact Sheet for Healthcare Providers: GravelBags.it This test is not yet approved or cleared by the [place] FDA and  has been authorized for detection and/or diagnosis of SARS-CoV-2 by  FDA under an Emergency Use Authorization (EUA). This EUA will remain  in effect (meaning this test can be used) for the duration of the  Covid-19 declaration under Section 564(b)(1) of the Act, 21  U.S.C. section 360bbb-3(b)(1), unless the authorization is  terminated or revoked. Performed at [facility], [place] [address] [place], [place] [address]   Blood culture (routine x 2)     Status: None (Preliminary result)   Collection Time: 01/07/20  1:30 PM   Specimen: BLOOD  Result Value Ref Range Status   Specimen Description   Final    BLOOD BLOOD LEFT FOREARM Performed at [facility] [address], [place], [place] [address]    Special Requests   Final    BOTTLES DRAWN AEROBIC ONLY Blood Culture  results may not be optimal due to an inadequate volume of blood received in culture bottles Performed at [facility] [address], [place], [place] [address]    Culture   Final    NO GROWTH 4 DAYS Performed at [facility], [place] [address], [place], [place] [address]    Report Status PENDING  Incomplete     Labs: BNP (last 3 results) Recent Labs    01/19/19 1638 01/07/20 1246  BNP 782* 353.6*   Basic Metabolic Panel: Recent Labs  Lab 01/07/20 1245 01/08/20 0326  NA 129* 133*  K 3.2* 3.8  CL 102 111  CO2 18* 16*  GLUCOSE 153* 198*  BUN 27* 23  CREATININE 1.29* 1.29*  CALCIUM 8.2* 7.5*  MG 2.4  --    Liver Function Tests: Recent Labs  Lab 01/07/20 1245  AST 59*  ALT 193*  ALKPHOS 153*  BILITOT 0.8  PROT 5.4*  ALBUMIN 2.6*   Recent Labs  Lab 01/07/20 1245  LIPASE 285*   Recent Labs  Lab 01/07/20 1246  AMMONIA 14   CBC: Recent Labs  Lab 01/07/20 1245 01/08/20 0326  WBC 5.0 4.2  NEUTROABS 4.0  --   HGB 10.2* 8.4*  HCT 30.6* 26.0*  MCV 87.4 90.9  PLT 137* 122*   Cardiac Enzymes: No results for input(s): CKTOTAL, CKMB, CKMBINDEX, TROPONINI in the last 168 hours. BNP: Invalid input(s): POCBNP CBG: Recent Labs  Lab 01/07/20 2336 01/08/20 0754  GLUCAP 217* 147*   D-Dimer No results for input(s): DDIMER in the last 72 hours. Hgb A1c No results for input(s): HGBA1C in the last 72 hours. Lipid Profile No results for input(s): CHOL, HDL, LDLCALC, TRIG, CHOLHDL, LDLDIRECT in the last 72 hours. Thyroid function studies No results for input(s): TSH, T4TOTAL, T3FREE, THYROIDAB in the last 72 hours.  Invalid input(s): FREET3 Anemia work up No results for input(s): VITAMINB12, FOLATE, FERRITIN, TIBC, IRON, RETICCTPCT in the last 72 hours. Urinalysis    Component Value Date/Time   COLORURINE YELLOW 01/07/2020 1245   APPEARANCEUR CLEAR 01/07/2020 1245   LABSPEC 1.024 01/07/2020 1245   PHURINE 6.0 01/07/2020 1245    GLUCOSEU >=500 (A) 01/07/2020 1245   HGBUR NEGATIVE 01/07/2020 1245   BILIRUBINUR NEGATIVE 01/07/2020 1245   BILIRUBINUR negative 12/14/2019 1647   KETONESUR 5 (A) 01/07/2020 1245   PROTEINUR 30 (A) 01/07/2020 1245   UROBILINOGEN 1.0 12/14/2019 1647   NITRITE NEGATIVE 01/07/2020 1245   LEUKOCYTESUR NEGATIVE 01/07/2020 1245   Sepsis Labs Invalid input(s): PROCALCITONIN,  WBC,  LACTICIDVEN Microbiology Recent Results (from the past 240 hour(s))  Urine Culture     Status: None   Collection Time: 01/04/20 12:30 PM   Specimen: Urine, Clean Catch  Result Value Ref Range Status   Specimen Description   Final    URINE, CLEAN CATCH Performed at [facility], [place] [address] [place], [place] [address]    Special Requests   Final    NONE Performed at [facility], [place] [address] [place], [place] [address]    Culture   Final    NO GROWTH Performed at [facility], [place] [address] [place], [place] [address]    Report Status 01/05/2020 FINAL  Final  Urine culture     Status: None   Collection Time: 01/07/20 12:45 PM   Specimen: Urine, Clean Catch  Result Value Ref Range Status   Specimen Description   Final    URINE, CLEAN CATCH Performed at [facility], [place] [address] [place], [place] [address]    Special Requests   Final    NONE Performed at [facility], [place] [address] [place], [place] [address]    Culture   Final    NO GROWTH Performed at [facility], [place] [address],  [place], [place] [address]    Report Status 01/08/2020 FINAL  Final  Blood culture (routine x 2)     Status: None (Preliminary result)   Collection Time: 01/07/20 12:45 PM   Specimen: BLOOD  Result Value Ref Range Status   Specimen Description   Final    BLOOD LEFT ANTECUBITAL Performed at [facility] [address] [place], [place] [address]    Special Requests   Final    BOTTLES  DRAWN AEROBIC AND ANAEROBIC Blood Culture adequate volume Performed at [facility] [address], [place], [place] [address]    Culture   Final    NO GROWTH 4 DAYS Performed at [facility], [place] [address] [place], [place] [address]    Report Status PENDING  Incomplete  Respiratory Panel by RT PCR (Flu A&B, Covid) - Nasopharyngeal Swab     Status: None   Collection Time: 01/07/20 12:46 PM   Specimen: Nasopharyngeal Swab  Result Value Ref Range Status   SARS Coronavirus 2 by RT PCR NEGATIVE NEGATIVE Final    Comment: (NOTE) SARS-CoV-2 target nucleic acids are NOT DETECTED. The SARS-CoV-2 RNA is generally detectable in upper respiratoy specimens during the acute phase of infection. The lowest concentration of SARS-CoV-2 viral copies this assay can detect is 131 copies/mL. A negative result does not preclude SARS-Cov-2 infection and should not be used as the sole basis for treatment or other patient management decisions. A negative result may occur with  improper specimen collection/handling, submission of specimen other than nasopharyngeal swab, presence of viral mutation(s) within the areas targeted by this assay, and inadequate number of viral copies (<131 copies/mL). A negative result must be combined with clinical observations, patient history, and epidemiological information. The expected result is Negative. Fact Sheet for Patients:  PinkCheek.be Fact Sheet for Healthcare Providers:  GravelBags.it This test is not yet ap proved or cleared by the [place] FDA and  has been authorized for detection and/or diagnosis of SARS-CoV-2 by FDA under an Emergency Use Authorization (EUA). This EUA will remain  in effect (meaning this test can be used) for the duration of the COVID-19 declaration under Section 564(b)(1) of the Act, 21 U.S.C. section 360bbb-3(b)(1), unless the authorization is  terminated or revoked sooner.    Influenza A by PCR NEGATIVE NEGATIVE Final   Influenza B by PCR NEGATIVE NEGATIVE Final    Comment: (NOTE) The Xpert Xpress SARS-CoV-2/FLU/RSV assay is intended as an aid in  the diagnosis of influenza from Nasopharyngeal swab specimens and  should not be used as a sole basis for treatment. Nasal washings and  aspirates are unacceptable for Xpert Xpress SARS-CoV-2/FLU/RSV  testing. Fact Sheet for Patients: PinkCheek.be Fact Sheet for Healthcare Providers: GravelBags.it This test is not yet approved or cleared by the [place] FDA and  has been authorized for detection and/or diagnosis of SARS-CoV-2 by  FDA under an Emergency Use Authorization (EUA). This EUA will remain  in effect (meaning this test can be used) for the duration of the  Covid-19 declaration under Section 564(b)(1) of the Act, 21  U.S.C. section 360bbb-3(b)(1), unless the authorization is  terminated or revoked. Performed at [facility], [place] [address] [place], [place] [address]   Blood culture (routine x 2)     Status: None (Preliminary result)   Collection Time: 01/07/20  1:30 PM   Specimen: BLOOD  Result Value Ref Range Status   Specimen Description   Final  BLOOD BLOOD LEFT FOREARM Performed at [facility] [address], [place], [place] [address]    Special Requests   Final    BOTTLES DRAWN AEROBIC ONLY Blood Culture results may not be optimal due to an inadequate volume of blood received in culture bottles Performed at [facility] [address] [place], [place] [address]    Culture   Final    NO GROWTH 4 DAYS Performed at [facility], [place] [address] [place], [place] [address]    Report Status PENDING  Incomplete     Time coordinating discharge:  39 minutes  SIGNED:   Georgette Shell, MD  [facility] 01/11/2020, 12:08  PM Pager   If 7PM-7AM, please contact night-coverage [email or web address] Password [email or web address]

## 2020-01-12 LAB — CULTURE, BLOOD (ROUTINE X 2)
Culture: NO GROWTH
Culture: NO GROWTH
Special Requests: ADEQUATE

## 2020-01-13 DIAGNOSIS — C7970 Secondary malignant neoplasm of unspecified adrenal gland: Secondary | ICD-10-CM | POA: Diagnosis not present

## 2020-01-13 DIAGNOSIS — I509 Heart failure, unspecified: Secondary | ICD-10-CM | POA: Diagnosis not present

## 2020-01-13 DIAGNOSIS — C3490 Malignant neoplasm of unspecified part of unspecified bronchus or lung: Secondary | ICD-10-CM | POA: Diagnosis not present

## 2020-01-13 DIAGNOSIS — R32 Unspecified urinary incontinence: Secondary | ICD-10-CM | POA: Diagnosis not present

## 2020-01-13 DIAGNOSIS — C770 Secondary and unspecified malignant neoplasm of lymph nodes of head, face and neck: Secondary | ICD-10-CM | POA: Diagnosis not present

## 2020-01-13 DIAGNOSIS — I5022 Chronic systolic (congestive) heart failure: Secondary | ICD-10-CM | POA: Diagnosis not present

## 2020-01-13 DIAGNOSIS — C7931 Secondary malignant neoplasm of brain: Secondary | ICD-10-CM | POA: Diagnosis not present

## 2020-01-13 DIAGNOSIS — I110 Hypertensive heart disease with heart failure: Secondary | ICD-10-CM | POA: Diagnosis not present

## 2020-01-13 DIAGNOSIS — I9589 Other hypotension: Secondary | ICD-10-CM | POA: Diagnosis not present

## 2020-01-13 DIAGNOSIS — I428 Other cardiomyopathies: Secondary | ICD-10-CM | POA: Diagnosis not present

## 2020-01-14 ENCOUNTER — Other Ambulatory Visit: Payer: Self-pay

## 2020-01-14 ENCOUNTER — Telehealth: Payer: Self-pay | Admitting: Internal Medicine

## 2020-01-14 ENCOUNTER — Other Ambulatory Visit: Payer: Self-pay | Admitting: Internal Medicine

## 2020-01-14 DIAGNOSIS — I428 Other cardiomyopathies: Secondary | ICD-10-CM | POA: Diagnosis not present

## 2020-01-14 DIAGNOSIS — C7931 Secondary malignant neoplasm of brain: Secondary | ICD-10-CM | POA: Diagnosis not present

## 2020-01-14 DIAGNOSIS — I110 Hypertensive heart disease with heart failure: Secondary | ICD-10-CM | POA: Diagnosis not present

## 2020-01-14 DIAGNOSIS — I5022 Chronic systolic (congestive) heart failure: Secondary | ICD-10-CM | POA: Diagnosis not present

## 2020-01-14 DIAGNOSIS — R32 Unspecified urinary incontinence: Secondary | ICD-10-CM | POA: Diagnosis not present

## 2020-01-14 DIAGNOSIS — C770 Secondary and unspecified malignant neoplasm of lymph nodes of head, face and neck: Secondary | ICD-10-CM | POA: Diagnosis not present

## 2020-01-14 DIAGNOSIS — I9589 Other hypotension: Secondary | ICD-10-CM | POA: Diagnosis not present

## 2020-01-14 DIAGNOSIS — C3490 Malignant neoplasm of unspecified part of unspecified bronchus or lung: Secondary | ICD-10-CM | POA: Diagnosis not present

## 2020-01-14 DIAGNOSIS — C7970 Secondary malignant neoplasm of unspecified adrenal gland: Secondary | ICD-10-CM | POA: Diagnosis not present

## 2020-01-14 NOTE — Telephone Encounter (Signed)
Phone call placed to patient's sister, Pam to offer to schedule a visit with [facility]. Phone rang, with no answer I left a voicemail for call back.

## 2020-01-14 NOTE — Patient Outreach (Signed)
Lee Hall [facility]) Care Management  [place]  01/14/2020   Lee Hall May 07, 1951 [id number]  Subjective: Telephone call to patient for follow up hospital.  Patient reports that things have been a struggle but he is making it. Patient states that [name] has been out to see him yesterday for nurse evaluation and PT to come today.  Patient states he has some sadness and disappointment with his cancer diagnosis and knows that he is in the last parts of his life. He denies any thoughts on harming himself. He states that his sister is involved with his care as well.  We discussed palliative/hospice referral.  Patient states he wants any care that will help him not to suffer in his last days.  He knows that his cancer is spread to his brain.  Advised patient that CM would call about referral.  He verbalized understanding.  Discussed his medications and provided education about decadron/  He states he has not had his medications picked up as he was told it was not ready.  Patient states he will call the pharmacy about his new medications after he gets off the phone with CM.  Patient states that he does have a DNR in the home and wants to be cremated.    Telephone call to [facility] and spoke with [name]  She states that there is a referral but patient was hospitalized and that she will update the staff person on the referral.  Telephone call back to patient to inform him that palliative care would be in contact.  He verbalized understanding.    Objective:   Encounter Medications:  Outpatient Encounter Medications as of 01/14/2020  Medication Sig  . blood glucose meter kit and supplies Dispense based on patient and insurance preference. Use up to four times daily as directed. (FOR ICD-10 E10.9, E11.9).  Marland Kitchen dexamethasone (DECADRON) 4 MG tablet Take 1 tablet (4 mg total) by mouth 2 (two) times daily.  . haloperidol (HALDOL) 0.5 MG tablet Take 1 tablet (0.5 mg total) by mouth  every 4 (four) hours as needed for agitation (or delirium).  Marland Kitchen ipratropium (ATROVENT) 0.06 % nasal spray USE 2 SPRAYS INTO BOTH NOSTRILS 3 (THREE) TIMES DAILY. (Patient taking differently: Place 2 sprays into both nostrils 3 (three) times daily. )  . LORazepam (ATIVAN) 1 MG tablet Take 1 tablet (1 mg total) by mouth every 4 (four) hours as needed for anxiety.  . metFORMIN (GLUCOPHAGE-XR) 500 MG 24 hr tablet TAKE 2 TABLETS (1,000 MG TOTAL) BY MOUTH AT BEDTIME. (Patient taking differently: Take 500 mg by mouth 2 (two) times daily. )  . methimazole (TAPAZOLE) 10 MG tablet Take 1 tablet (10 mg total) by mouth daily.  . ondansetron (ZOFRAN) 4 MG tablet Take 1 tablet (4 mg total) by mouth every 6 (six) hours as needed for nausea.   No facility-administered encounter medications on file as of 01/14/2020.    Functional Status:  In your present state of health, do you have any difficulty performing the following activities: 01/14/2020 01/07/2020  Hearing? N N  Vision? N N  Difficulty concentrating or making decisions? N N  Walking or climbing stairs? Y Y  Comment weakness secondary to weakness  Dressing or bathing? N N  Doing errands, shopping? N Y  Comment sister assists -  Conservation officer, nature and eating ? N -  Using the Toilet? N -  In the past six months, have you accidently leaked urine? N -  Do you have problems  with loss of bowel control? N -  Managing your Medications? N -  Managing your Finances? N -  Housekeeping or managing your Housekeeping? Y -  Comment sister assists -  Some recent data might be hidden    Fall/Depression Screening: Fall Risk  01/14/2020  Falls in the past year? 1  Number falls in past yr: 0  Injury with Fall? 0  Risk for fall due to : History of fall(s)  Follow up Falls prevention discussed   PHQ 2/9 Scores 01/14/2020 08/07/2018  PHQ - 2 Score 6 0  PHQ- 9 Score 12 1    Assessment: Patient has life limiting illness and palliative/ghospice referral pending.  Plan:   [facility] CM Care Plan Problem One     Most Recent Value  Care Plan Problem One  Terminal diagnosis of Melanoma  Role Documenting the Problem One  Care Management Telephonic Coordinator  Care Plan for Problem One  Active  THN Long Term Goal   Patient will not readmit to hospital within the next 31 days  THN Long Term Goal Start Date  01/14/20  Interventions for Problem One Long Term Goal  RN CM discussed pateint current status and hospice referral. Patient has home health involved at this time.  THN CM Short Term Goal #1   Patient will have evaluation visit with hospice within the next 14 days.  THN CM Short Term Goal #1 Start Date  01/14/20  Interventions for Short Term Goal #1  Discussed hospice referral.  Telephone call to Virgie on status of recent referral.  THN CM Short Term Goal #2   Patient will report having all medications as prescribed within 14 days.  THN CM Short Term Goal #2 Start Date  01/14/20  Interventions for Short Term Goal #2  Reviewed medications. Patient to have medications picked up when ready.     RN CM will provide ongoing education and support to patient through phone calls.   RN CM will send welcome packet with consent to patient.   RN CM will send initial barriers letter, assessment, and care plan to primary care physician.   RN CM will contact patient later in the week and patient agrees to next contact.

## 2020-01-14 NOTE — Telephone Encounter (Signed)
New Message    Lee Hall is calling from [facility] and is needing orders if pt is admitted for hospice    Please advise

## 2020-01-14 NOTE — Telephone Encounter (Signed)
Returned call to [name] with [employer] care.  Advised Pt's PCP Dr. Ethlyn Gallery should be signing hospice orders for Pt.  Amber indicates understanding and will follow up with Pt/PCP.

## 2020-01-15 ENCOUNTER — Telehealth: Payer: Self-pay | Admitting: Family Medicine

## 2020-01-15 ENCOUNTER — Encounter: Payer: Self-pay | Admitting: Family Medicine

## 2020-01-15 NOTE — Telephone Encounter (Signed)
Lee Hall from Home health called and stated that the pt has been discharged from the hospital for cancer and UTI.  Lee Hall is calling to get verbal orders for skilled nursing, physical/occupatinal therapy and social worker.  Lee Hall can be reached at [phone]

## 2020-01-15 NOTE — Telephone Encounter (Signed)
Patient dc'd from hospital on 01/11/20.  Several medicatons discontinued.  Hospice/palliative care will be following him.  His BP remains low.  According to the [employer], he'd like Dr. Lovena Le to weigh in on what cardiac medications, if any he should restart.

## 2020-01-16 ENCOUNTER — Telehealth: Payer: Self-pay

## 2020-01-16 ENCOUNTER — Telehealth: Payer: Self-pay | Admitting: *Deleted

## 2020-01-16 MED ORDER — MIDODRINE HCL 5 MG PO TABS: 5.0000 mg | ORAL_TABLET | Freq: Two times a day (BID) | ORAL | 11 refills | Status: DC

## 2020-01-16 MED ORDER — FUROSEMIDE 20 MG PO TABS: 20.0000 mg | ORAL_TABLET | Freq: Every day | ORAL | 2 refills | Status: AC | PRN

## 2020-01-16 NOTE — Telephone Encounter (Signed)
Ok; there is another message on him as well for hospital follow up scheduling

## 2020-01-16 NOTE — Telephone Encounter (Signed)
Spoke with the pt and scheduled an appt for 2/19 for a virtual visit at 11:30am.

## 2020-01-16 NOTE — Telephone Encounter (Signed)
Spoke with Lee Hall and informed her of verbal orders as below.  Virtual visit scheduled for 2/19.

## 2020-01-16 NOTE — Telephone Encounter (Signed)
-----   Message from [name], MD sent at 01/16/2020  6:06 AM EST ----- Regarding: FW: hospital follow up Happy to add him in at noon today or virtual slot looks available 11:30 on friday ----- Message ----- From: Christiane Ha, LPN Sent: 01/20/9797   5:16 PM EST To: [name], MD Subject: hospital follow up                             Patient does not meet criteria for TCM but I called him anyway to schedule a hospital follow up appointment. Discharge orders were PCP in 1-2 weeks and he was not satisfied with 3/3. You are full on all other days prior to that. Please have scheduling staff schedule hospital follow up in a spot where you want to put him please.

## 2020-01-17 ENCOUNTER — Ambulatory Visit: Payer: Medicare HMO | Admitting: Internal Medicine

## 2020-01-17 MED ORDER — AMIODARONE HCL 200 MG PO TABS: 200.0000 mg | ORAL_TABLET | Freq: Every day | ORAL | 3 refills | Status: AC

## 2020-01-17 MED ORDER — MEXILETINE HCL 150 MG PO CAPS: 150.0000 mg | ORAL_CAPSULE | Freq: Three times a day (TID) | ORAL | 3 refills | Status: DC

## 2020-01-17 MED ORDER — MEXILETINE HCL 150 MG PO CAPS: 150.0000 mg | ORAL_CAPSULE | Freq: Three times a day (TID) | ORAL | 11 refills | Status: AC

## 2020-01-17 NOTE — Telephone Encounter (Signed)
Per discussion with Pt and Dr. Jarold Motto would like to continue amiodarone and mexiletine at this time.  Pt does NOT need midodrine.  Advised Pt to discontinue all other cardiac medications at this time.

## 2020-01-17 NOTE — Addendum Note (Signed)
Addended by: Willeen Cass A on: 01/17/2020 02:57 PM   Modules accepted: Orders

## 2020-01-18 ENCOUNTER — Other Ambulatory Visit: Payer: Self-pay

## 2020-01-18 ENCOUNTER — Encounter: Payer: Self-pay | Admitting: Family Medicine

## 2020-01-18 ENCOUNTER — Telehealth: Payer: Self-pay | Admitting: *Deleted

## 2020-01-18 ENCOUNTER — Telehealth (INDEPENDENT_AMBULATORY_CARE_PROVIDER_SITE_OTHER): Payer: Medicare HMO | Admitting: Family Medicine

## 2020-01-18 DIAGNOSIS — R600 Localized edema: Secondary | ICD-10-CM | POA: Diagnosis not present

## 2020-01-18 DIAGNOSIS — E0590 Thyrotoxicosis, unspecified without thyrotoxic crisis or storm: Secondary | ICD-10-CM | POA: Diagnosis not present

## 2020-01-18 DIAGNOSIS — E1165 Type 2 diabetes mellitus with hyperglycemia: Secondary | ICD-10-CM | POA: Diagnosis not present

## 2020-01-18 DIAGNOSIS — C7931 Secondary malignant neoplasm of brain: Secondary | ICD-10-CM | POA: Diagnosis not present

## 2020-01-18 DIAGNOSIS — I5022 Chronic systolic (congestive) heart failure: Secondary | ICD-10-CM | POA: Diagnosis not present

## 2020-01-18 DIAGNOSIS — I959 Hypotension, unspecified: Secondary | ICD-10-CM

## 2020-01-18 NOTE — Telephone Encounter (Signed)
-----   Message from [name], MD sent at 01/18/2020 12:41 PM EST ----- He will be updating me weekly, but I think having a visit a few weeks out to talk would be good if needed.

## 2020-01-18 NOTE — Progress Notes (Signed)
Virtual Visit via Telephone Note  I connected with Lee Hall  on 01/18/20 at 11:30 AM EST by telephone and verified that I am speaking with the correct person using two identifiers.   I discussed the limitations, risks, security and privacy concerns of performing an evaluation and management service by telephone and the availability of in person appointments. I also discussed with the patient that there may be a patient responsible charge related to this service. The patient expressed understanding and agreed to proceed.  Location patient: home Location provider: work office Participants present for the call: patient, provider Patient did not have a visit in the prior 7 days to address this/these issue(s).   History of Present Illness: [name] is there with him today. She does not have questions today.   I spoke with patient on Wednesday to make sure he did not need anything before today's follow-up appointment.  He was admitted to the hospital on the ninth day after starting his new cancer treatment regimen for melanoma.  Awoke with extreme fatigue weak legs and had to pull himself down on a bed to call his family.  He could not even stand.  He does not recall some of his hospitalization.  Back home in his apartment alone (around the corner from his sister) and is able to get up and down.  He has had some flare of his left sciatic nerve but this is mild.  He is working on eating and drinking well.  He does drink protein smoothies multiple times a day.  He does not wish to be on hospice and wants to treat his heart as if he does not have cancer.  His defibrillator was turned off when he was in the hospital.  He is wondering about which medications to restart (like Jardiance which was held during hospitalization).  I advised him to check his sugars we would rediscuss at this visit today.  He also was having some issues with edema, Lasix was given for this.  Feeling a little more  sluggish today than he has been.   Has had improvement with ankle swelling - down 40% since yesterday.   Blood pressure - 100/49 at 6am 68 beats 138 blood sugar. He just feels like bp should be higher. Was stopped on carvedilol, jardiance, entresto. Does feel a little light headed. Using walls to walk around; takes time with position changes. Would like to feel more "solid on ground"  102/59 with hr 78. Stays around this most of day.   Avg sugar 150-155 first thing in morning.   Has full urinary stream. Has some urge or he will leak. Wears depends at night for leaking. Urinates day and night q 2-2.5 hours. Drinking as much as he can. Tries to get 64 oz fluids in day - water, sugar free ginger ale; sugar free iced tea. At least half is plain water.   Prior to hospitalization had significant increased thirst. Not doing this now.    [facility]. Nurse coming once weekly on Monday.   No headaches, no fevers, chills. Appetite and overall energy improved. He is having bowel movements a couple of days/week with help of stool softener. No current pain.   Observations/Objective: Patient sounds cheerful and well on the phone. I do not appreciate any SOB. Speech and thought processing are grossly intact. Patient reported vitals: see above  Assessment and Plan: 1. Malignant melanoma metastatic to brain [facility]) Patient has elected to pursue hospice care.  He is no longer getting any treatment for melanoma.  I will check with oncology about need for dexamethasone currently/follow-up/taper.  2. Controlled type 2 diabetes mellitus with hyperglycemia, without long-term current use of insulin ([place]) I would prefer him to be on as few medications as possible.  Continue to monitor blood sugars intermittently.  Let me know if elevating.  He plans to send me weekly updates with blood pressure and blood sugar results.  He will call with any concerns.  For now we will discontinue Metformin.   We will stop Jardiance due to risk of hypotension and side effect of increased urination for him.  3. Hyperthyroidism Following with endocrinology.  Continue methimazole.  Has been stable.  4. Hypotension, unspecified hypotension type Has been stable.  Continue to monitor.  He does have a follow-up next week with cardiology but plans to be in communication with them about current blood pressures.  5. Chronic systolic heart failure ([place]) See above.  6. Lower extremity edema This is improved with Lasix.  Continue to monitor and use Lasix as needed.   Overall, he seems to be feeling much better.  Appetite is improved, he is comfortable and without pain.  He feels much better after conversation with hospice and is happy to pursue this avenue for his care.  He is currently living alone but his sister lives close by and is able to see him on a regular basis.  He is not planning on driving at this point.  Follow Up Instructions:  Return in about 3 weeks (around 02/08/2020) for Chronic condition visit.   [id number] 5-10 [id number] 11-20 9443 21-30 I did not refer this patient for an OV in the next 24 hours for this/these issue(s).  I discussed the assessment and treatment plan with the patient. The patient was provided an opportunity to ask questions and all were answered. The patient agreed with the plan and demonstrated an understanding of the instructions.   The patient was advised to call back or seek an in-person evaluation if the symptoms worsen or if the condition fails to improve as anticipated.  I provided 28 minutes of non-face-to-face time during this encounter.   Micheline Rough, MD

## 2020-01-18 NOTE — Patient Outreach (Signed)
[facility]) Care Management  01/18/2020  Lee Hall February 18, 1951 [id number]   Telephone call to patient. He reports he is doing better daily.  He states that he now is full hospice and is grateful for the assistance.  Patient states he has all his medications and taking as prescribed.  He states that hospice is now helping with his medications. Sister who lives down the street remains available to him. Patient denies any further needs at this time.  Plan: RN CM will close case as patient now with hospice.      Jone Baseman, RN, MSN [facility] Care Management Coordinator Direct Line [phone] Cell [phone] Toll Free: [phone]  Fax: [phone]

## 2020-01-18 NOTE — Telephone Encounter (Signed)
Spoke with the pt and scheduled an appt for 3/3 at 10:45am.

## 2020-01-19 NOTE — Progress Notes (Signed)
Lee Hall

## 2020-01-20 ENCOUNTER — Encounter: Payer: Self-pay | Admitting: Oncology

## 2020-01-21 ENCOUNTER — Ambulatory Visit: Payer: Medicare HMO | Admitting: Endocrinology

## 2020-01-22 ENCOUNTER — Telehealth: Payer: Self-pay | Admitting: Oncology

## 2020-01-23 ENCOUNTER — Inpatient Hospital Stay: Payer: Medicare HMO

## 2020-01-23 ENCOUNTER — Inpatient Hospital Stay (HOSPITAL_BASED_OUTPATIENT_CLINIC_OR_DEPARTMENT_OTHER): Payer: Medicare HMO | Admitting: Oncology

## 2020-01-23 DIAGNOSIS — C779 Secondary and unspecified malignant neoplasm of lymph node, unspecified: Secondary | ICD-10-CM | POA: Diagnosis not present

## 2020-01-23 DIAGNOSIS — C439 Malignant melanoma of skin, unspecified: Secondary | ICD-10-CM

## 2020-01-23 NOTE — Progress Notes (Signed)
Hematology and Oncology Follow Up for Telemedicine Visits  Lee Hall [id number] 1951-06-16 69 y.o. 01/23/2020 3:39 PM Lee Hall, MDKoberlein, Lee Berg, MD   I connected with Lee Hall on 01/23/20 at  3:45 PM EST by telephone visit and verified that I am speaking with the correct person using two identifiers.   I discussed the limitations, risks, security and privacy concerns of performing an evaluation and management service by telemedicine and the availability of in-person appointments. I also discussed with the patient that there may be a patient responsible charge related to this service. The patient expressed understanding and agreed to proceed.  Other persons participating in the visit and their role in the encounter: [name]  Patient's location:  Home Provider's location:  Office  Principle Diagnosis: 69 year old man with  advanced melanoma presented initially with right shoulder localized lesion 2019 and subsequently developed stage IV disease in December 2020.  He has widespread metastasis: Peritoneal, adrenal as well as CNS disease.   Prior Therapy:  He is status post resection in December 09, 2017 completed in [place].  He had positive margins without any lymph node sampling.  He is status post right axillary lymph node dissection completed on 10/05/2018 by Dr. Barry Hall.  Final pathology revealed 4 out of 28 lymph node involvement.  Nivolumab 480 mg monthly started in January 2020 for adjuvant purposes.  He is status post 2 months of therapy and therapy discontinued because of acute illness and hospitalization.   Trametinib and dabrafenib therapy started in January 2021 and was discontinued based on patient's wishes and poor tolerance.   Current therapy:  Supportive care only.   Interim History: Lee Hall denies any recent complications.  Since his discharge from the hospital has reported slow improvement in his overall quality of life.   He is eating better and ambulating short distances.  Denies any recent falls or syncope.        Allergies: No Known Allergies       Lab Results: Lab Results  Component Value Date   WBC 4.2 01/08/2020   HGB 8.4 (L) 01/08/2020   HCT 26.0 (L) 01/08/2020   MCV 90.9 01/08/2020   PLT 122 (L) 01/08/2020     Chemistry      Component Value Date/Time   NA 133 (L) 01/08/2020 0326   NA 137 03/05/2019 0000   K 3.8 01/08/2020 0326   CL 111 01/08/2020 0326   CO2 16 (L) 01/08/2020 0326   BUN 23 01/08/2020 0326   BUN 41 (A) 03/05/2019 0000   CREATININE 1.29 (H) 01/08/2020 0326   CREATININE 1.32 (H) 01/04/2020 1206   CREATININE 1.07 01/19/2019 1638   GLU 192 03/05/2019 0000      Component Value Date/Time   CALCIUM 7.5 (L) 01/08/2020 0326   ALKPHOS 153 (H) 01/07/2020 1245   AST 59 (H) 01/07/2020 1245   AST 216 (HH) 01/04/2020 1206   ALT 193 (H) 01/07/2020 1245   ALT 506 (HH) 01/04/2020 1206   BILITOT 0.8 01/07/2020 1245   BILITOT 0.9 01/04/2020 1206        Impression and Plan:  69 year old man with:  1.   End-stage melanoma initially diagnosed in January 2019 and subsequently developed stage IV disease with widespread involvement.  The natural course of this disease was reviewed again with the patient and his sister via phone.  He understands he has an incurable malignancy and any treatment are palliative.  He has been very clear about his wishes not  to proceed with any further treatment at this time.  I recommended supportive care only.  He is already enrolled in hospice.   2.  Prognosis and end-of-life issues: His prognosis is poor with limited life expectancy likely in a matter of weeks.  He is already enrolled in hospice and and has DNR form signed.  He is not experiencing any pain issues at this time but I assured him that any pain issues will be addressed promptly.    4. Follow-up: Will be as needed and will rely on hospice for any updates.      I  discussed the assessment and treatment plan with the patient. The patient was provided an opportunity to ask questions and all were answered. The patient agreed with the plan and demonstrated an understanding of the instructions.   The patient was advised to call back or seek an in-person evaluation if the symptoms worsen or if the condition fails to improve as anticipated.  I provided 20 minutes of non face-to-face telephone visit time.  The time was spent on reviewing his disease status, prognosis as well as outlining future plan of care. Lee Button, MD 01/23/2020 3:39 PM

## 2020-01-24 ENCOUNTER — Telehealth: Payer: Self-pay | Admitting: Oncology

## 2020-01-24 NOTE — Telephone Encounter (Signed)
No los per 2/24.

## 2020-01-29 NOTE — Progress Notes (Signed)
Virtual Visit via Telephone Note  I connected with Lee Hall  on 01/30/20 at 10:45 AM EST by telephone and verified that I am speaking with the correct person using two identifiers.   I discussed the limitations, risks, security and privacy concerns of performing an evaluation and management service by telephone and the availability of in person appointments. I also discussed with the patient that there may be a patient responsible charge related to this service. The patient expressed understanding and agreed to proceed.  Location patient: home Location provider: work office Participants present for the call: patient, provider Patient did not have a visit in the prior 7 days to address this/these issue(s).   History of Present Illness: 69 year old male with metastatic melanoma widespread - peritoneal, adrenal, CNS currently not on therapy and has entered hospice. Did have follow up virtual visit with oncology 01/23/20. No further treatment.   He is regularly in touch with cardiology.   No strength in legs; has to be supported to stand up. Harder to breathe. Using breathing machine; is on oxygen 2L, but this is not helping him. Hospice nurse was supposed to be there at 10:30 but hasn't been there yet today. Was there on Monday and called yesterday and was supposed to come today.   No fevers, no chills. Just hit him overnight. Having difficulty with controlling urine. Can't get to bathroom fast enough so wearing briefs. No discomfort with urination.   Right side of chest - near axilla - pain radiating right to left. Not really coughing. Pain coming and going. No pattern. Describes as numb radiating pain. Rates as 6/10.   No swelling in legs.   Any exertion at all from his point (sitting up, change in position) is cutting breathing in half. Nurse stated that lung sounds were clear when she was there on Monday.   Has relocated into sister's house so he can have help.  100 for O2  and 124 for heart rate.   Very anxious. Does feel he gets some relief with the lorazepam; slight with haldol.   Also has haldol as well as lorazepam. Was getting lorazepam q2-3 hours. Yesterday suggested alternating haldol with the lorazepam.      Observations/Objective: Patient sounds anxious, scared on phone. Some shortness of breathe/hoarse voice. Sister there with him.   Speech and thought processing are grossly intact, although detail recall is not always correct (ie sister correcting timing of meds, symptoms)  Patient reported vitals: no fever, HR 124, O2 sat 100% on 2L [place]  Assessment and Plan: 1. Malignant melanoma metastatic to brain [facility]) Uncertain if current sx related to advancement of disease or if there is more acute process going on.  I did call hospice nurse, and she was not planning on seeing patient today.  I spoke with receptionist.  I was able to get him added onto the schedule to get a home visit today due to his ongoing symptoms and increase in severity of symptoms.  I do think evaluation and home is appropriate rather than send to the hospital given his terminal diagnosis and wishes.  2. Hyperthyroidism Has been stable.  Following with endocrinology.  3. Controlled type 2 diabetes mellitus with hyperglycemia, without long-term current use of insulin (HCC) We did not discuss blood sugars today.  Has been well controlled.    4. Coronary artery disease involving native coronary artery of native heart without angina pectoris He does follow with cardiology.  Current chest discomfort is more  right-sided.  It has been going on for some time and is intermittent.  Not associated with any additional symptoms.  I think best next time is for home nurse to evaluate.  5. Chronic systolic heart failure ([place]) See above.  6. Tachycardia Uncertain if this is related to anxiety or acute process.  7. Chest discomfort I feel the next best step would be repeat lung exam.  Patient  was told on Monday the lungs were clear, but I think it is reasonable to repeat exam before further evaluation.  I would prefer to keep him out of the hospital if possible.  8. Urinary incontinence, unspecified type I have asked home hospice to check a urine on him due to increased incontinence, leg weakness, increased heart rate.  I called patient back to inform that somebody from hospice would be out to evaluate him later today.  I let them know I was available if needed.  Follow Up Instructions:  Will let home hospice eval today; follow up prn  [id number] [id number] [id number] [id number] 21-30 I did not refer this patient for an OV in the next 24 hours for this/these issue(s).  I discussed the assessment and treatment plan with the patient. The patient was provided an opportunity to ask questions and all were answered. The patient agreed with the plan and demonstrated an understanding of the instructions.   The patient was advised to call back or seek an in-person evaluation if the symptoms worsen or if the condition fails to improve as anticipated.  I provided 15 minutes of non-face-to-face time during this encounter.   Micheline Rough, MD

## 2020-01-30 ENCOUNTER — Other Ambulatory Visit: Payer: Self-pay

## 2020-01-30 ENCOUNTER — Ambulatory Visit: Payer: Medicare HMO | Admitting: Family Medicine

## 2020-01-30 ENCOUNTER — Telehealth (INDEPENDENT_AMBULATORY_CARE_PROVIDER_SITE_OTHER): Payer: Medicare HMO | Admitting: Family Medicine

## 2020-01-30 DIAGNOSIS — I2510 Atherosclerotic heart disease of native coronary artery without angina pectoris: Secondary | ICD-10-CM

## 2020-01-30 DIAGNOSIS — I5022 Chronic systolic (congestive) heart failure: Secondary | ICD-10-CM | POA: Diagnosis not present

## 2020-01-30 DIAGNOSIS — E1165 Type 2 diabetes mellitus with hyperglycemia: Secondary | ICD-10-CM | POA: Diagnosis not present

## 2020-01-30 DIAGNOSIS — R000 Tachycardia, unspecified: Secondary | ICD-10-CM

## 2020-01-30 DIAGNOSIS — R0789 Other chest pain: Secondary | ICD-10-CM | POA: Diagnosis not present

## 2020-01-30 DIAGNOSIS — R32 Unspecified urinary incontinence: Secondary | ICD-10-CM

## 2020-01-30 DIAGNOSIS — C7931 Secondary malignant neoplasm of brain: Secondary | ICD-10-CM | POA: Diagnosis not present

## 2020-01-30 DIAGNOSIS — E0590 Thyrotoxicosis, unspecified without thyrotoxic crisis or storm: Secondary | ICD-10-CM

## 2020-01-31 ENCOUNTER — Encounter: Payer: Self-pay | Admitting: Family Medicine

## 2020-01-31 ENCOUNTER — Encounter: Payer: Self-pay | Admitting: Oncology

## 2020-02-01 NOTE — Telephone Encounter (Signed)
Spoke with the pt and he stated Hospice increased the dose of Haldol from 0.5 to 5.0 and he has noticed a difference in his breathing over the past 2 days.  Stated the breathing is better in which he did have "heavy breathing and now it is light breathing".  Message sent to PCP.

## 2020-02-01 NOTE — Telephone Encounter (Signed)
Can you check in with him? See what hospice said when they went for visit on weds. Get update on symptoms?   Since I haven't listened to him i'm not sure. Might be progression of his cancer, but I would like to hear what hospice nurse who actually was able to listen to lungs told him.

## 2020-02-04 ENCOUNTER — Telehealth: Payer: Self-pay | Admitting: Family Medicine

## 2020-02-04 NOTE — Telephone Encounter (Signed)
Spoke with Lee Hall and informed her Dr Ethlyn Gallery has not received the results and asked if she could fax these to our office.  Lee Hall stated the pt has had a major turn and may be in their inpatient program.  Stated she will contact our office with further info and fax the results to be placed in the chart.  Message sent to PCP.

## 2020-02-04 NOTE — Telephone Encounter (Signed)
I did not see these results; did you?

## 2020-02-04 NOTE — Telephone Encounter (Signed)
Lee Hall from [facility] needed to confirm if we received pt urinalysis results and if any new orders are needed.

## 2020-02-04 NOTE — Telephone Encounter (Signed)
Noted  

## 2020-02-06 ENCOUNTER — Telehealth: Payer: Self-pay | Admitting: Family Medicine

## 2020-02-06 NOTE — Telephone Encounter (Signed)
done

## 2020-02-06 NOTE — Telephone Encounter (Signed)
Shavonne from [facility] stated they faxed over a certificate of terminal illness Monday March 8. They are needing it signed and faxed back ASAP.   Phone: [phone] FAX: [phone]

## 2020-02-06 NOTE — Telephone Encounter (Signed)
Forms faxed to [phone] and sent to be scanned.

## 2020-02-08 NOTE — Telephone Encounter (Signed)
I called [facility] and spoke with [place].  I advised her I recall faxing the form, receiving a confirmation and this was sent to be scanned.  Lockie Mola looked in [place], found the fax and stated she will print this out.

## 2020-02-08 NOTE — Telephone Encounter (Signed)
Shavonne from [facility] stated that they did not receive the forms and if it can be re-faxed. Validated the fax number is correct. [phone]

## 2020-02-12 ENCOUNTER — Ambulatory Visit: Payer: Medicare HMO | Admitting: Internal Medicine

## 2020-02-19 ENCOUNTER — Ambulatory Visit: Payer: Medicare HMO | Admitting: Endocrinology

## 2020-02-28 DEATH — deceased

## 2020-09-29 NOTE — Telephone Encounter (Signed)
Error

## 2020-09-29 NOTE — Telephone Encounter (Signed)
error 

## 2021-03-04 IMAGING — DX DG CHEST 1V PORT
1 series · 2 of 2 positions shown · non-contrast
Comparison: 02/01/2019

CLINICAL DATA: Respiratory failure

EXAM:
PORTABLE CHEST 1 VIEW

[Series 1: chest · 0.14mm/px · 2 of 2 slices shown]
[im 1/2]
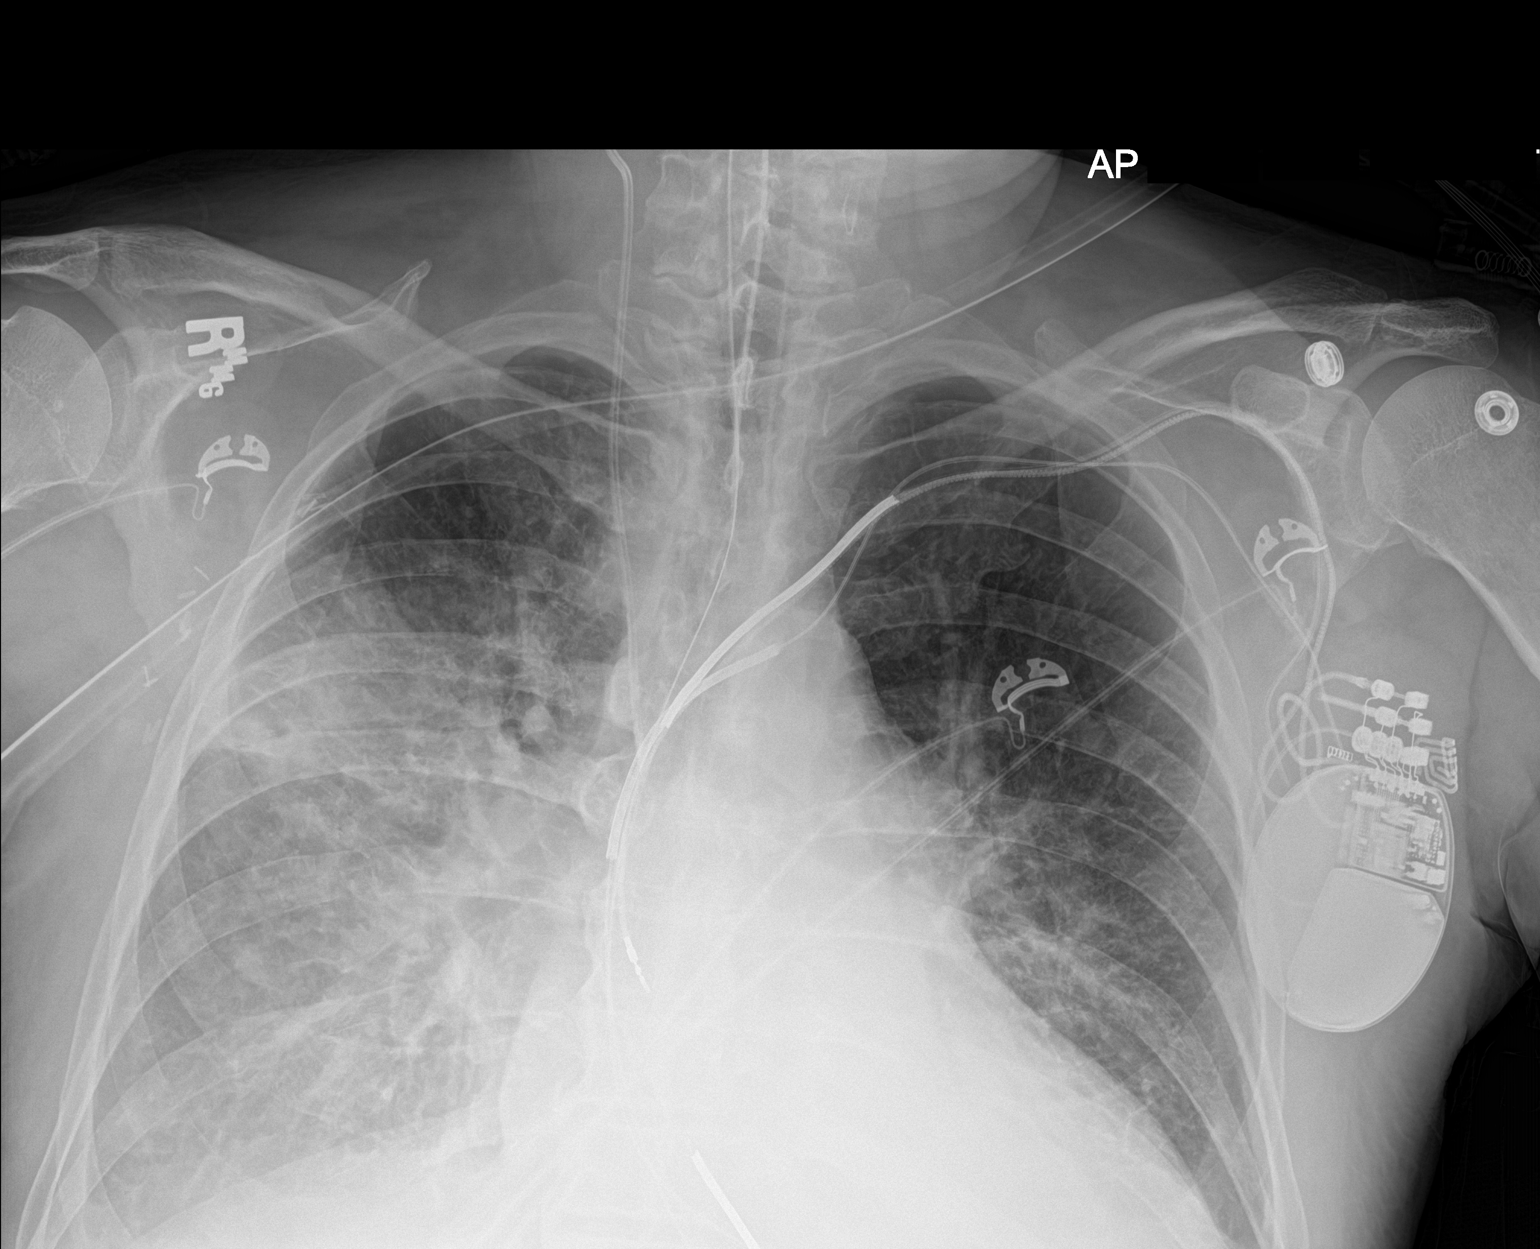
[im 2/2]
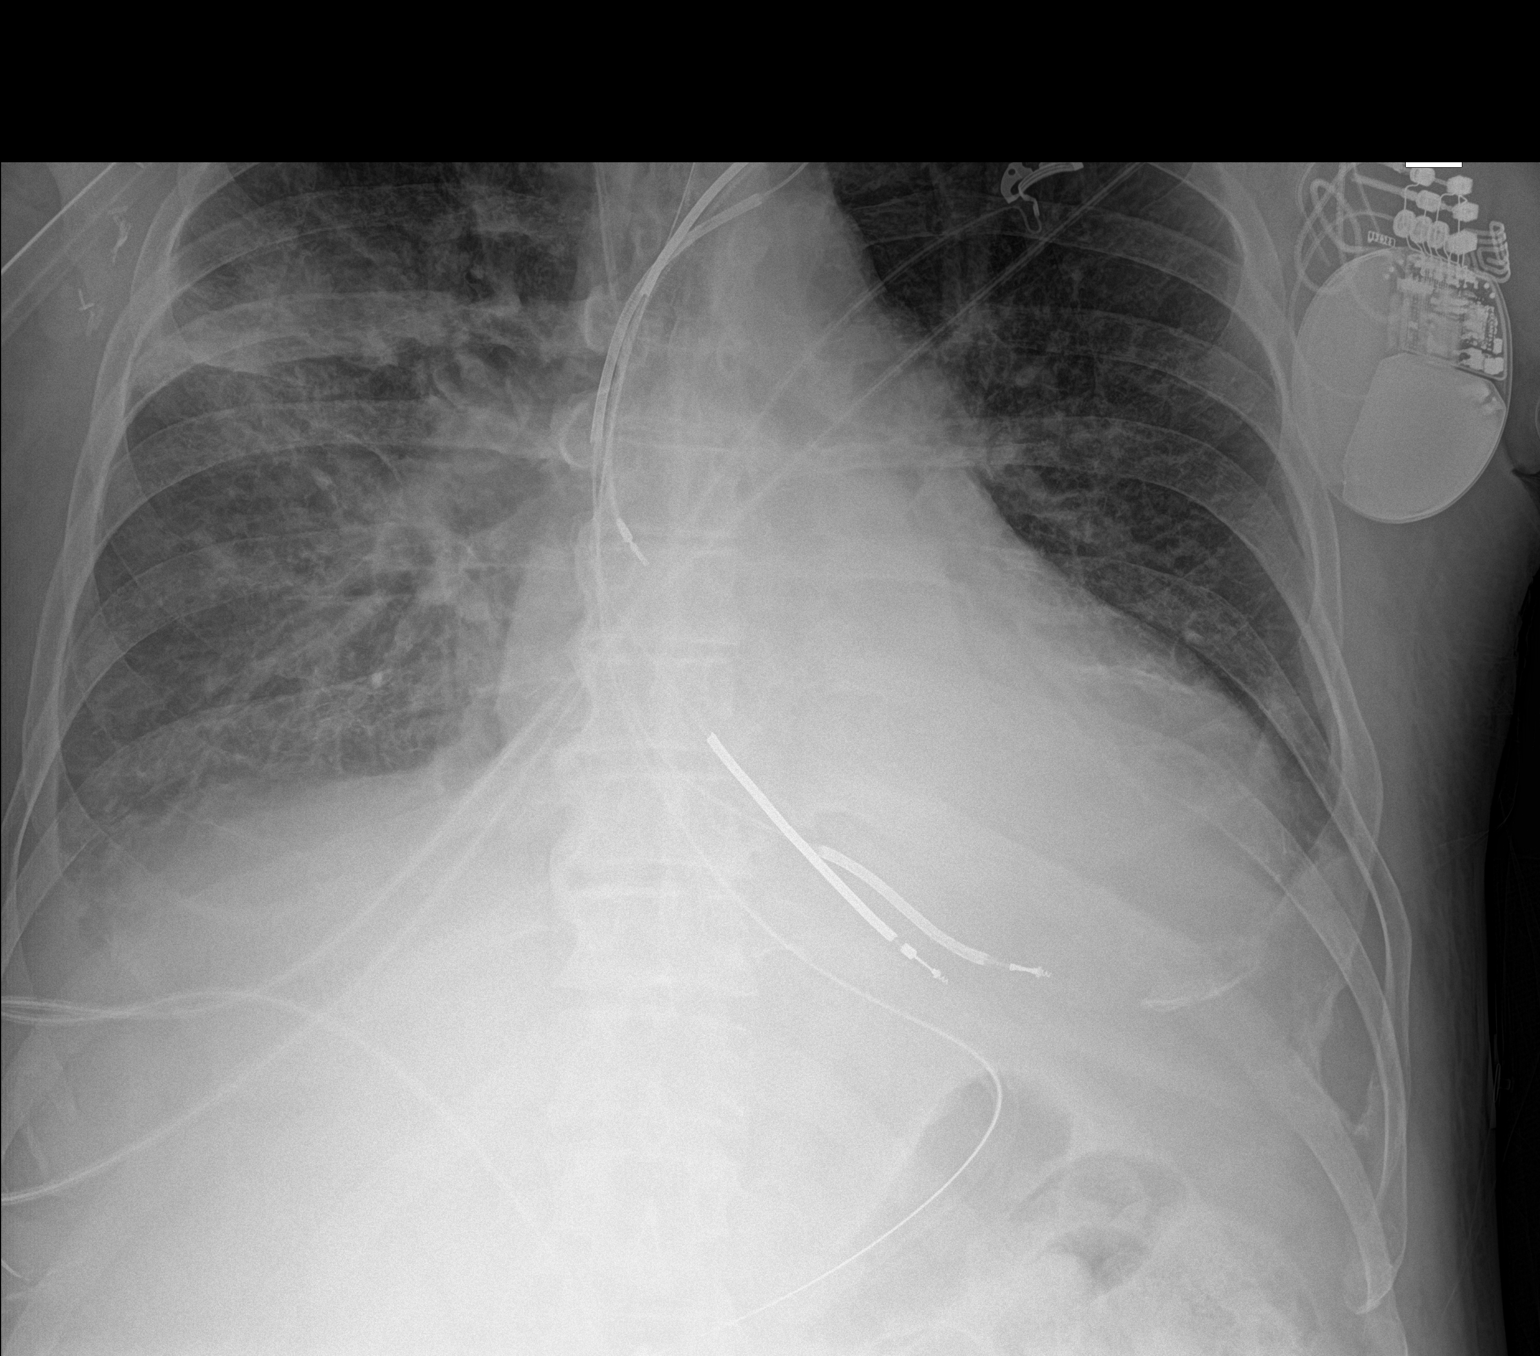

[2 of 2 positions shown; findings below may reference images not displayed]

FINDINGS: Left AICD, endotracheal tube, right central line and NG tube remain
in place, unchanged. Cardiomegaly. Bilateral airspace disease again
noted, right greater than left. Small bilateral effusions. No acute
bony abnormality.
IMPRESSION: Stable bilateral airspace disease, right greater than left and small
effusions.

## 2021-03-08 IMAGING — DX PORTABLE CHEST - 1 VIEW
1 series · 1 of 1 positions shown · non-contrast
Comparison: Radiographs 02/05/2019 and 02/06/2019.

CLINICAL DATA: Intubated patient.  Airspace disease.

EXAM:
PORTABLE CHEST 1 VIEW

[chest]
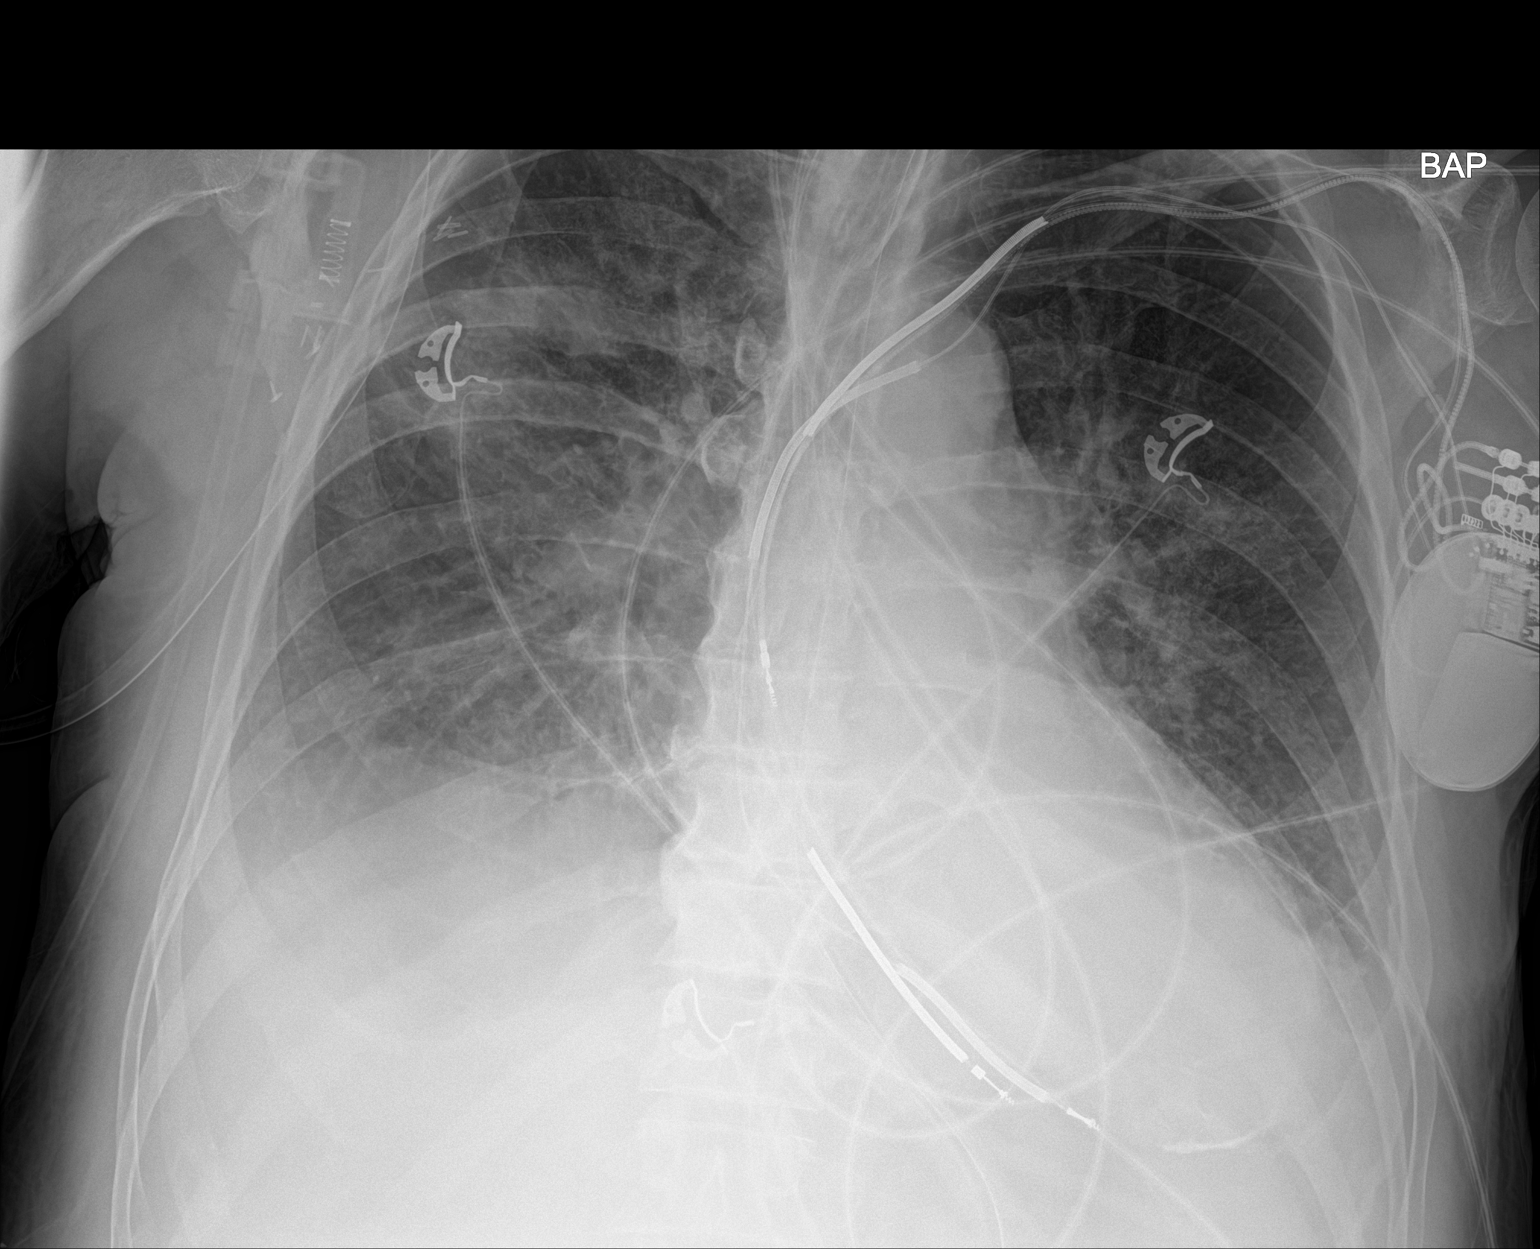

[1 of 1 positions shown; findings below may reference images not displayed]

FINDINGS: 5835 hours. Tip of the endotracheal tube is unchanged in the mid
trachea. Enteric tube projects below the diaphragm, tip not
visualized. Left subclavian AICD leads appear unchanged. Multiple
other tubes and lines overlie the chest.

The heart size and mediastinal contours are stable with evidence of
a calcified left apical aneurysm. The right-greater-than-left
airspace opacities appear minimally improved. There are probable
small bilateral pleural effusions. No pneumothorax.
IMPRESSION: 1. Stable support system and lines.
2. Minimal improvement in right-greater-than-left airspace
opacities. Persistent bilateral pleural effusions.

## 2022-02-06 IMAGING — CT CT HEAD W/O CM
3 series · 14 of 47 positions shown, 16 images · non-contrast
Comparison: 05/03/2019
COMPARISON: 05/03/2019

Addendum:
CLINICAL DATA: Visual difficulties with dizziness, history of
metastatic melanoma

EXAM:
CT HEAD WITHOUT CONTRAST
TECHNIQUE: Contiguous axial images were obtained from the base of the skull
through the vertex without intravenous contrast.

[Series 2: head wo · axial · 0.45mm/px · z∈[-172,-32]mm · 8 of 34 slices shown, 10 images]
[im 3/34  brain]
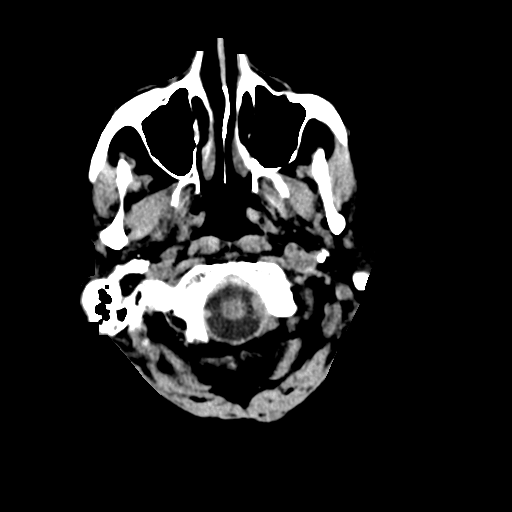
[im 3/34  bone]
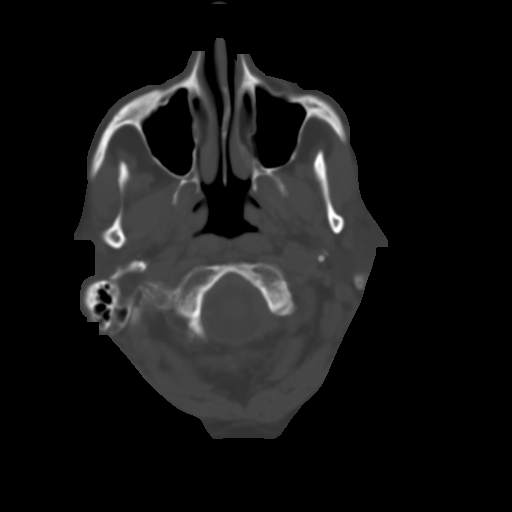
[im 7/34  brain]
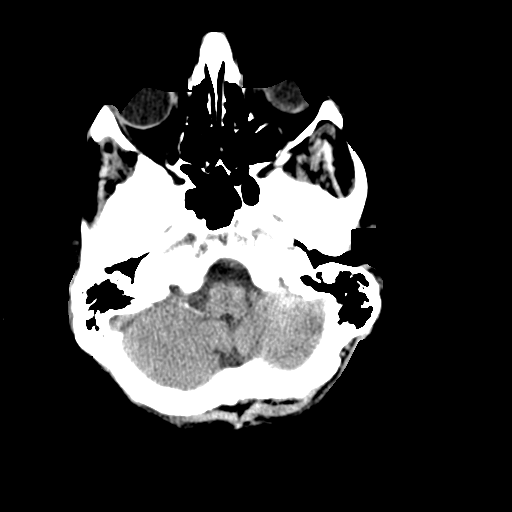
[im 11/34  brain]
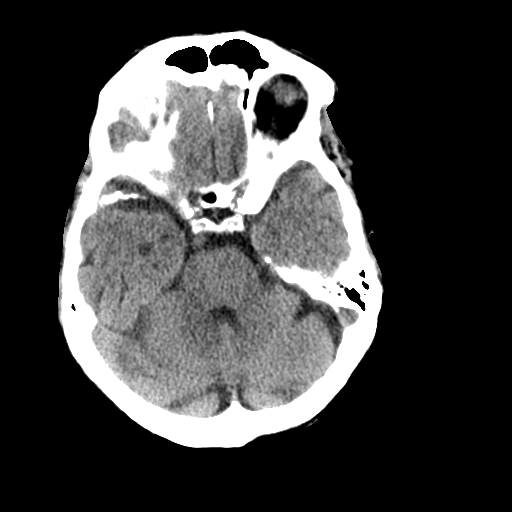
[im 15/34  brain]
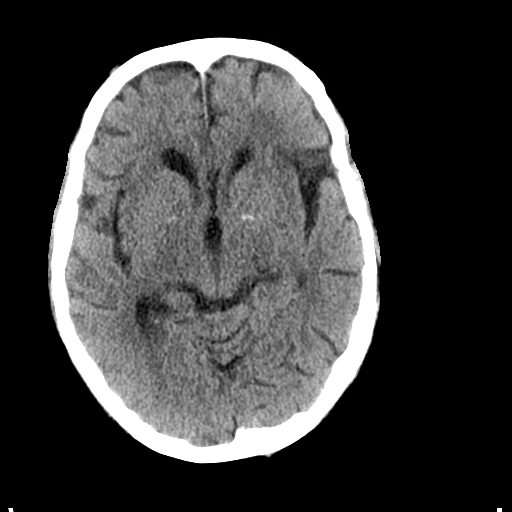
[im 19/34  brain]
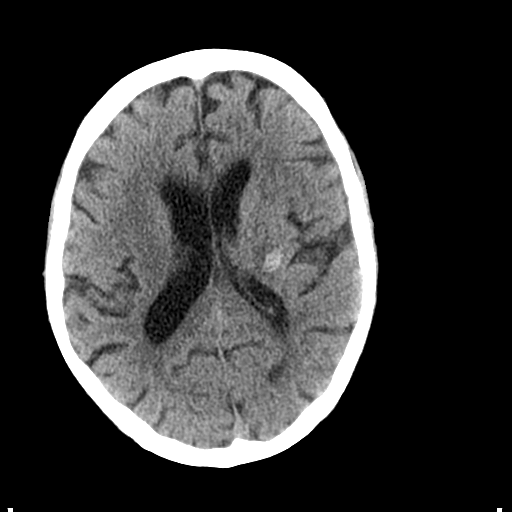
[im 19/34  bone]
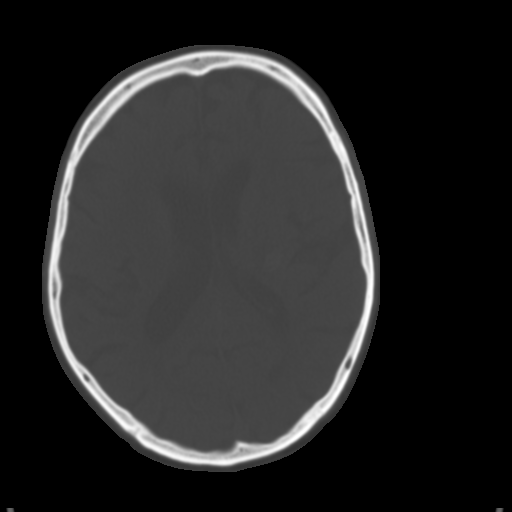
[im 23/34  brain]
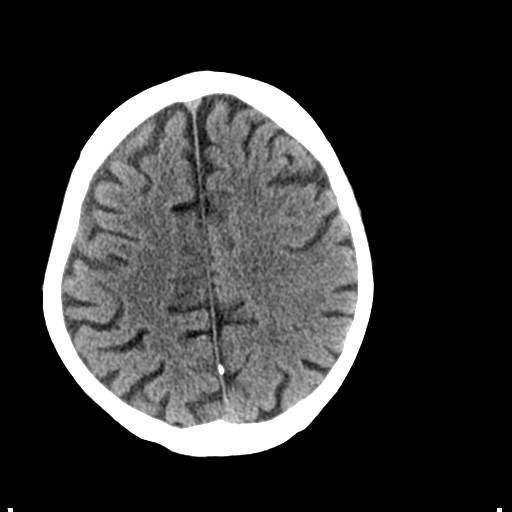
[im 27/34  brain]
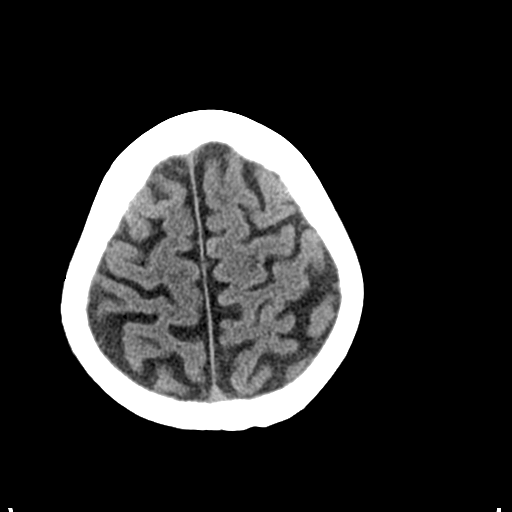
[im 31/34  brain]
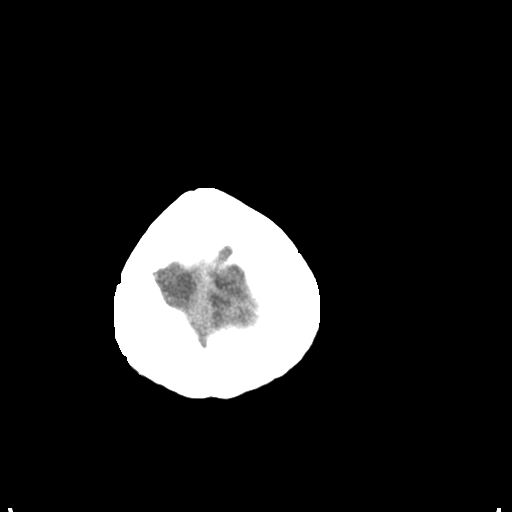

[Series 4: coronal soft tissue · coronal · 0.35mm/px · 3 of 81 slices shown]
[im 27/81  brain]
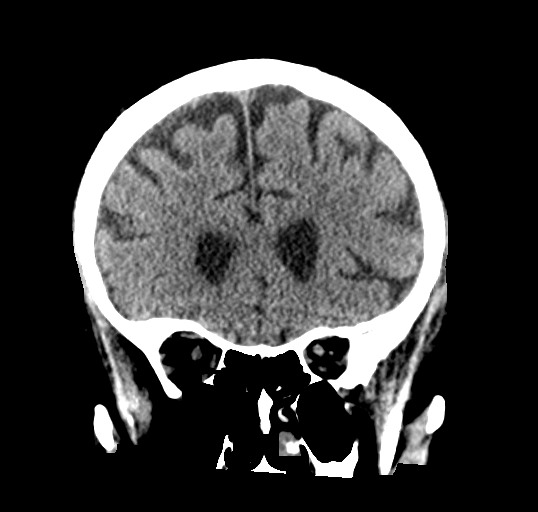
[im 36/81  brain]
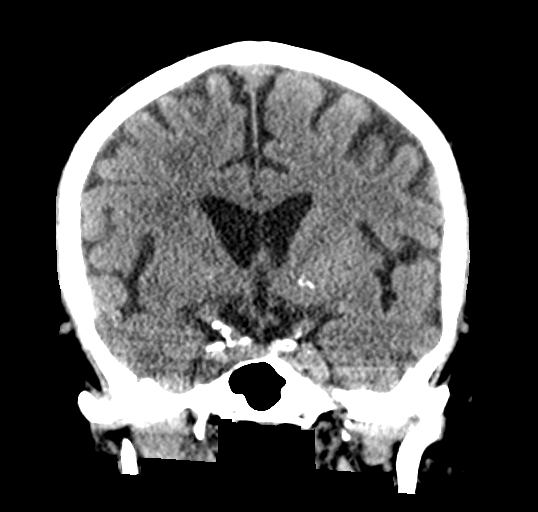
[im 45/81  brain]
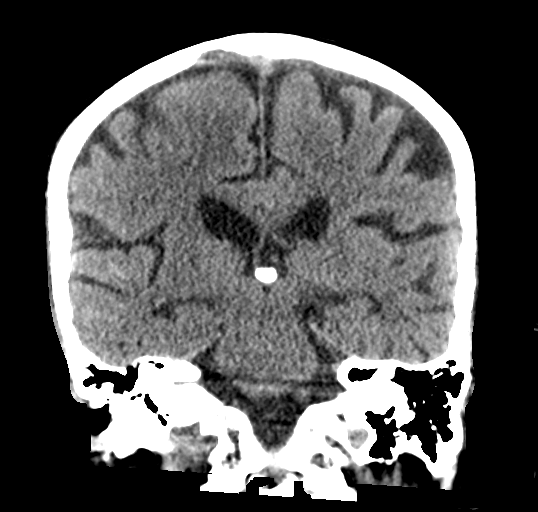

[Series 5: sagittal soft tissue · sagittal · 0.37mm/px · 3 of 57 slices shown]
[im 19/57  brain]
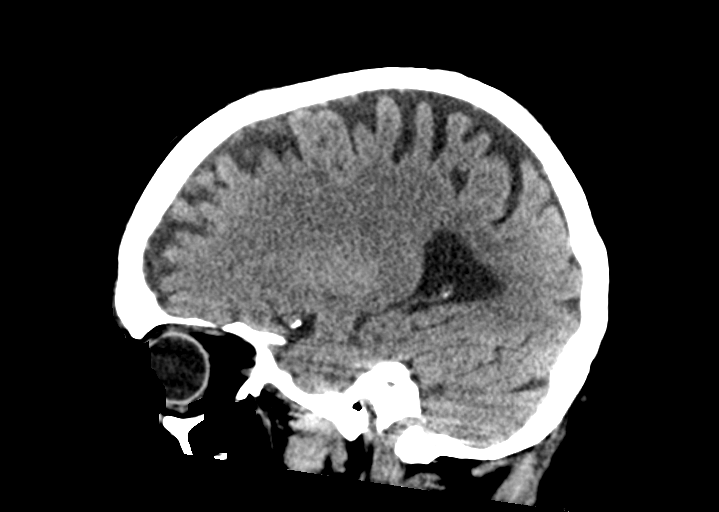
[im 29/57  brain]
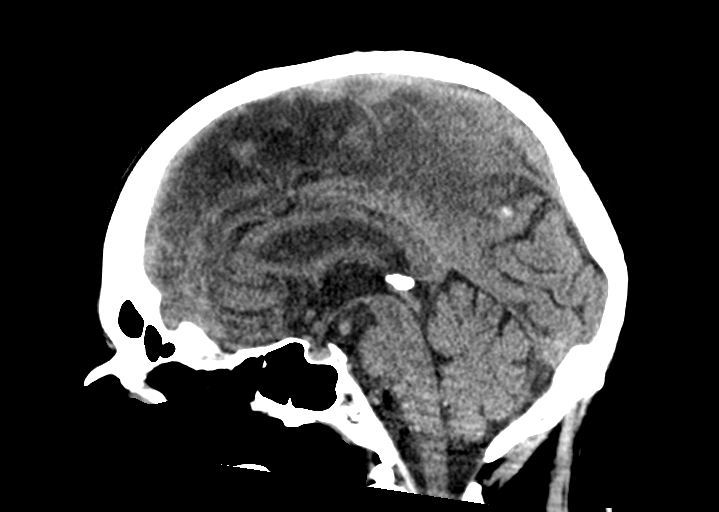
[im 38/57  brain]
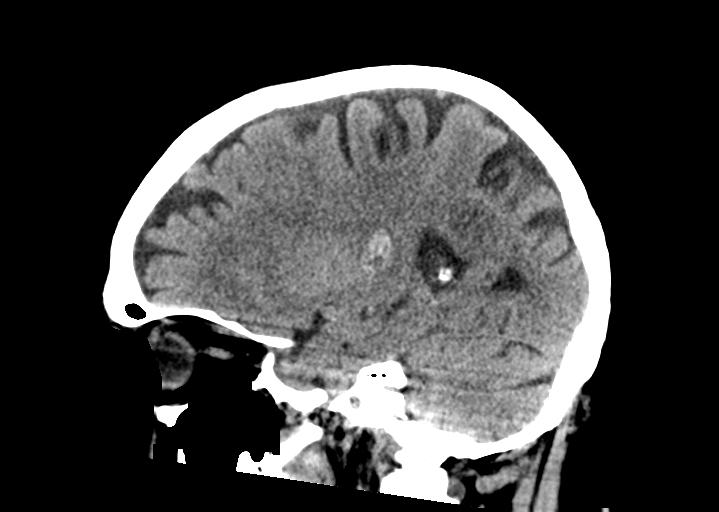

[14 of 47 positions shown; findings below may reference images not displayed]

FINDINGS: Brain: No findings to suggest acute hemorrhage are identified. In
the deep white matter on the left as well as within the subcortical
area of the right temporal lobe region, there are ovoid appearing
hyperdensities identified which were not seen on the prior exam.
Some minimal surrounding edema is noted in the lesion on the left.
No other definitive hyperdensities are seen.

Vascular: No hyperdense vessel or unexpected calcification.

Skull: Normal. Negative for fracture or focal lesion.

Sinuses/Orbits: No acute finding.

Other: None.
IMPRESSION: New ovoid areas of increased attenuation deep white matter in the
left parietal region and subcortical area of the right temporal lobe
suspicious for metastatic lesions. MRI of the brain with and without
contrast is recommended when clinically able. Given the patient's
pacing device, this may need coordination and scheduling with
Cardiology if possible.

ADDENDUM:
Critical Value/emergent results were called by telephone at the time
of interpretation on 01/07/2020 at [DATE] to Dr. SACHKA SAMNAKARO ALYA ALAA
, who verbally acknowledged these results.

*** End of Addendum ***
FINDINGS: Brain: No findings to suggest acute hemorrhage are identified. In
the deep white matter on the left as well as within the subcortical
area of the right temporal lobe region, there are ovoid appearing
hyperdensities identified which were not seen on the prior exam.
Some minimal surrounding edema is noted in the lesion on the left.
No other definitive hyperdensities are seen.

Vascular: No hyperdense vessel or unexpected calcification.

Skull: Normal. Negative for fracture or focal lesion.

Sinuses/Orbits: No acute finding.

Other: None.
IMPRESSION: New ovoid areas of increased attenuation deep white matter in the
left parietal region and subcortical area of the right temporal lobe
suspicious for metastatic lesions. MRI of the brain with and without
contrast is recommended when clinically able. Given the patient's
pacing device, this may need coordination and scheduling with
Cardiology if possible.
